# Patient Record
Sex: Male | Born: 1945
Health system: Southern US, Community
[De-identification: ages and names within clinical notes are randomized; demographics above are authoritative.]

## PROBLEM LIST (undated history)

## (undated) DIAGNOSIS — F1910 Other psychoactive substance abuse, uncomplicated: Secondary | ICD-10-CM

## (undated) DIAGNOSIS — F1010 Alcohol abuse, uncomplicated: Secondary | ICD-10-CM

---

## 2017-09-01 ENCOUNTER — Other Ambulatory Visit: Payer: Self-pay

## 2017-09-01 ENCOUNTER — Encounter (HOSPITAL_COMMUNITY): Payer: Self-pay

## 2017-09-01 ENCOUNTER — Emergency Department (HOSPITAL_COMMUNITY): Payer: Medicare Other

## 2017-09-01 ENCOUNTER — Emergency Department (HOSPITAL_COMMUNITY)
Admission: EM | Admit: 2017-09-01 | Discharge: 2017-09-01 | Disposition: A | Discharge status: Home / Self Care | Payer: Medicare Other | Attending: Emergency Medicine | Admitting: Emergency Medicine

## 2017-09-01 DIAGNOSIS — Y939 Activity, unspecified: Secondary | ICD-10-CM | POA: Insufficient documentation

## 2017-09-01 DIAGNOSIS — F17210 Nicotine dependence, cigarettes, uncomplicated: Secondary | ICD-10-CM | POA: Diagnosis not present

## 2017-09-01 DIAGNOSIS — S20219A Contusion of unspecified front wall of thorax, initial encounter: Secondary | ICD-10-CM | POA: Diagnosis not present

## 2017-09-01 DIAGNOSIS — T40601A Poisoning by unspecified narcotics, accidental (unintentional), initial encounter: Secondary | ICD-10-CM | POA: Insufficient documentation

## 2017-09-01 DIAGNOSIS — Y929 Unspecified place or not applicable: Secondary | ICD-10-CM | POA: Insufficient documentation

## 2017-09-01 DIAGNOSIS — Y999 Unspecified external cause status: Secondary | ICD-10-CM | POA: Insufficient documentation

## 2017-09-01 DIAGNOSIS — Y639 Failure in dosage during unspecified surgical and medical care: Secondary | ICD-10-CM | POA: Insufficient documentation

## 2017-09-01 DIAGNOSIS — F1910 Other psychoactive substance abuse, uncomplicated: Secondary | ICD-10-CM

## 2017-09-01 LAB — I-STAT CHEM 8, ED
BUN: 10 mg/dL (ref 8–23)
CHLORIDE: 106 mmol/L (ref 98–111)
Calcium, Ion: 1.11 mmol/L — ABNORMAL LOW (ref 1.15–1.40)
Creatinine, Ser: 1.1 mg/dL (ref 0.61–1.24)
GLUCOSE: 103 mg/dL — AB (ref 70–99)
HCT: 53 % — ABNORMAL HIGH (ref 39.0–52.0)
HEMOGLOBIN: 18 g/dL — AB (ref 13.0–17.0)
Potassium: 3.9 mmol/L (ref 3.5–5.1)
SODIUM: 141 mmol/L (ref 135–145)
TCO2: 22 mmol/L (ref 22–32)

## 2017-09-01 LAB — RAPID URINE DRUG SCREEN, HOSP PERFORMED
Amphetamines: NOT DETECTED
BARBITURATES: NOT DETECTED
Benzodiazepines: NOT DETECTED
Cocaine: POSITIVE — AB
Opiates: POSITIVE — AB
TETRAHYDROCANNABINOL: POSITIVE — AB

## 2017-09-01 MED ORDER — ONDANSETRON HCL 4 MG/2ML IJ SOLN
4.0000 mg | Freq: Once | INTRAMUSCULAR | Status: AC
  Administered 2017-09-01: 4 mg via INTRAVENOUS
  Filled 2017-09-01: qty 2

## 2017-09-01 NOTE — ED Provider Notes (Signed)
[facility] DEPT Provider Note   CSN: [id number] Arrival date & time: 09/01/17  0204    History   Chief Complaint Chief Complaint  Patient presents with  . Drug Overdose    HPI Douglas Spencer is a 72 y.o. male.   71 year old male with a history of alcohol abuse presents to the emergency department secondary to overdose.  EMS was called by the patient's caregiver when he came downstairs to note the patient seated in his recliner, ashy in color, and not breathing.  Patient had previously been drinking with his friend who is a heroin addict.  Caregiver states that he has always been concerned about the patient drinking with his friend due to the possibility of the patient being taken advantage of.  Patient noted to be apneic on scene.  He was given Narcan with improved responsiveness.  Patient states that he can only remember drinking with his friend while seated in his recliner.  He denies ever using illicit drugs, stating that he has "never liked it"; patient stating, "I'm just a drunk".     History reviewed. No pertinent past medical history.  There are no active problems to display for this patient.   History reviewed. No pertinent surgical history.      Home Medications    Prior to Admission medications   Not on File    Family History No family history on file.  Social History Social History   Tobacco Use  . Smoking status: Current Every Day Smoker    Packs/day: 0.50    Types: Cigarettes  Substance Use Topics  . Alcohol use: Yes    Comment: pt states he drinks multiple beers every day.  . Drug use: Yes    Types: Marijuana     Allergies   Patient has no allergy information on record.   Review of Systems Review of Systems Ten systems reviewed and are negative for acute change, except as noted in the HPI.    Physical Exam Updated Vital Signs BP 138/90   Pulse 67   Temp 97.8 F (36.6 C) (Oral)   Resp 13   Ht 5\' 8"   (1.727 m)   Wt 54.4 kg (120 lb)   SpO2 96%   BMI 18.25 kg/m   Physical Exam  Constitutional: He is oriented to person, place, and time. He appears well-developed and well-nourished. No distress.  Nontoxic appearing. Emesis on pants.  HENT:  Head: Normocephalic and atraumatic.  Eyes: Conjunctivae and EOM are normal. No scleral icterus.  Pupils 51mm bilaterally, reactive.  Neck: Normal range of motion.  Cardiovascular: Normal rate, regular rhythm and intact distal pulses.  Pulmonary/Chest: Effort normal. No stridor. No respiratory distress. He has no wheezes.  Bruising to central chest without crepitus. Lungs CTAB.  Abdominal: Soft. He exhibits no distension. There is no tenderness. There is no guarding.  Soft, nontender abdomen.  Musculoskeletal: Normal range of motion.  Neurological: He is alert and oriented to person, place, and time. He exhibits normal muscle tone. Coordination normal.  GCS 15. Speech is goal oriented. Patient moving all extremities spontaneously.  Skin: Skin is warm and dry. No rash noted. He is not diaphoretic. No erythema. No pallor.  Psychiatric: He has a normal mood and affect. His behavior is normal.  Nursing note and vitals reviewed.    ED Treatments / Results  Labs (all labs ordered are listed, but only abnormal results are displayed) Labs Reviewed  RAPID URINE DRUG SCREEN, HOSP PERFORMED - Abnormal;  Notable for the following components:      Result Value   Opiates POSITIVE (*)    Cocaine POSITIVE (*)    Tetrahydrocannabinol POSITIVE (*)    All other components within normal limits  I-STAT CHEM 8, ED    EKG None  Radiology Dg Chest 2 View  Result Date: 09/01/2017 CLINICAL DATA:  Chest wall contusion EXAM: CHEST - 2 VIEW COMPARISON:  None. FINDINGS: Hyperinflation. No acute airspace disease or effusion. Normal heart size. No pneumothorax. Degenerative changes of the spine. IMPRESSION: No active cardiopulmonary disease.  Hyperinflation.  Electronically Signed   By: Donavan Foil M.D.   On: 09/01/2017 04:06    Procedures Procedures (including critical care time)  Medications Ordered in ED Medications  ondansetron (ZOFRAN) injection 4 mg (has no administration in time range)     Initial Impression / Assessment and Plan / ED Course  I have reviewed the triage vital signs and the nursing notes.  Pertinent labs & imaging results that were available during my care of the patient were reviewed by me and considered in my medical decision making (see chart for details).     72 year old male presents to the emergency department following opiate overdose.  Given Narcan by EMS prior to arrival which improved mentation.  EMS was called by caregiver who found the patient "ashy" and not breathing.  Patient denies any known ingestion of illicit substances.  He last recalls drinking beer with his friend who is a known heroin abuser.  The patient has been hemodynamically stable without clinical decompensation over 2.5-hour observation in the ED.  He has no complaints of pain.  Only signs of external injury is bruising to the chest.  There is no evidence of acute cardiopulmonary abnormality on chest x-ray.  Plan for discharge and outpatient primary care follow-up as needed.  Patient expresses comfort with discharge.  Return precautions discussed and provided. Patient discharged in stable condition with no unaddressed concerns.   Vitals:   09/01/17 0205 09/01/17 0208 09/01/17 0214 09/01/17 0430  BP: 117/79   138/90  Pulse: 64   67  Resp: 16   13  Temp: 97.8 F (36.6 C)     TempSrc: Oral     SpO2: 99% 95%  96%  Weight:   54.4 kg (120 lb)   Height:   5\' 8"  (1.727 m)     Final Clinical Impressions(s) / ED Diagnoses   Final diagnoses:  Opiate overdose, accidental or unintentional, initial encounter [facility])  Polysubstance abuse [facility])    ED Discharge Orders    None       Antonietta Breach, PA-C 09/01/17 0511    Shanon Rosser,  MD 09/01/17 [phone]

## 2017-09-01 NOTE — ED Notes (Signed)
Bed: JG85 Expected date:  Expected time:  Means of arrival:  Comments: 72 yo M/Heroine Overdose

## 2017-09-01 NOTE — ED Triage Notes (Signed)
Pt arrives by [facility] Per EMS, pt was found by EMS on bedroom floor unconscious. Pt was given 1 mg of Narcan and took 3 minutes to regain consciousness. Pt denies drug use. 12 lead was performed and was unremarkable. Pt does not have any signs of injury. Pt's caregiver called ED and stated that the pt "shot him up with heroin."

## 2017-09-01 NOTE — Discharge Instructions (Signed)
You were found to be positive for opiates as well as cocaine and marijuana.  We recommend that you discontinue use of all illicit substances.  Monitor your alcohol intake and try to drink in moderation.  Follow-up with your primary care doctor for recheck as needed.

## 2019-03-05 ENCOUNTER — Ambulatory Visit: Payer: Medicare Other | Attending: Internal Medicine

## 2019-03-26 ENCOUNTER — Ambulatory Visit: Payer: Medicare Other | Attending: Internal Medicine

## 2019-03-26 DIAGNOSIS — Z23 Encounter for immunization: Secondary | ICD-10-CM

## 2019-03-26 NOTE — Progress Notes (Signed)
   Covid-19 Vaccination Clinic  Name:  Douglas Spencer    MRN: [id number] DOB: 1945/05/15  03/26/2019  [name] was observed post Covid-19 immunization for 15 minutes without incidence. He was provided with Vaccine Information Sheet and instruction to access the V-Safe system.   [name] was instructed to call 911 with any severe reactions post vaccine: Marland Kitchen Difficulty breathing  . Swelling of your face and throat  . A fast heartbeat  . A bad rash all over your body  . Dizziness and weakness    Immunizations Administered    Name Date Dose VIS Date Route   Pfizer COVID-19 Vaccine 03/26/2019  2:02 PM 0.3 mL 01/22/2019 Intramuscular   Manufacturer: [place]   Lot: [id number]   [place]: [id number]

## 2020-02-27 ENCOUNTER — Emergency Department (HOSPITAL_COMMUNITY): Payer: Medicare HMO

## 2020-02-27 ENCOUNTER — Emergency Department (HOSPITAL_COMMUNITY)
Admission: EM | Admit: 2020-02-27 | Discharge: 2020-02-28 | Disposition: A | Discharge status: Home / Self Care | Payer: Medicare HMO | Attending: Emergency Medicine | Admitting: Emergency Medicine

## 2020-02-27 DIAGNOSIS — R29810 Facial weakness: Secondary | ICD-10-CM | POA: Diagnosis not present

## 2020-02-27 DIAGNOSIS — G9340 Encephalopathy, unspecified: Secondary | ICD-10-CM | POA: Diagnosis not present

## 2020-02-27 DIAGNOSIS — R251 Tremor, unspecified: Secondary | ICD-10-CM | POA: Diagnosis present

## 2020-02-27 DIAGNOSIS — R569 Unspecified convulsions: Secondary | ICD-10-CM | POA: Diagnosis not present

## 2020-02-27 DIAGNOSIS — I499 Cardiac arrhythmia, unspecified: Secondary | ICD-10-CM | POA: Diagnosis not present

## 2020-02-27 DIAGNOSIS — R55 Syncope and collapse: Secondary | ICD-10-CM | POA: Insufficient documentation

## 2020-02-27 DIAGNOSIS — H5589 Other irregular eye movements: Secondary | ICD-10-CM | POA: Insufficient documentation

## 2020-02-27 DIAGNOSIS — R4701 Aphasia: Secondary | ICD-10-CM | POA: Diagnosis not present

## 2020-02-27 DIAGNOSIS — R4020 Unspecified coma: Secondary | ICD-10-CM | POA: Diagnosis not present

## 2020-02-27 DIAGNOSIS — R4182 Altered mental status, unspecified: Secondary | ICD-10-CM | POA: Insufficient documentation

## 2020-02-27 DIAGNOSIS — R9431 Abnormal electrocardiogram [ECG] [EKG]: Secondary | ICD-10-CM | POA: Diagnosis not present

## 2020-02-27 DIAGNOSIS — F17210 Nicotine dependence, cigarettes, uncomplicated: Secondary | ICD-10-CM | POA: Insufficient documentation

## 2020-02-27 DIAGNOSIS — R404 Transient alteration of awareness: Secondary | ICD-10-CM | POA: Diagnosis not present

## 2020-02-27 DIAGNOSIS — R402431 Glasgow coma scale score 3-8, in the field [EMT or ambulance]: Secondary | ICD-10-CM | POA: Diagnosis not present

## 2020-02-27 DIAGNOSIS — R69 Illness, unspecified: Secondary | ICD-10-CM | POA: Diagnosis not present

## 2020-02-27 DIAGNOSIS — R4781 Slurred speech: Secondary | ICD-10-CM | POA: Diagnosis not present

## 2020-02-27 LAB — CBG MONITORING, ED: Glucose-Capillary: 95 mg/dL (ref 70–99)

## 2020-02-27 LAB — I-STAT CHEM 8, ED
BUN: 16 mg/dL (ref 8–23)
Calcium, Ion: 1.1 mmol/L — ABNORMAL LOW (ref 1.15–1.40)
Chloride: 105 mmol/L (ref 98–111)
Creatinine, Ser: 0.9 mg/dL (ref 0.61–1.24)
Glucose, Bld: 87 mg/dL (ref 70–99)
HCT: 50 % (ref 39.0–52.0)
Hemoglobin: 17 g/dL (ref 13.0–17.0)
Potassium: 4.1 mmol/L (ref 3.5–5.1)
Sodium: 141 mmol/L (ref 135–145)
TCO2: 23 mmol/L (ref 22–32)

## 2020-02-27 LAB — DIFFERENTIAL
Abs Immature Granulocytes: 0.01 10*3/uL (ref 0.00–0.07)
Basophils Absolute: 0.1 10*3/uL (ref 0.0–0.1)
Basophils Relative: 1 %
Eosinophils Absolute: 0.4 10*3/uL (ref 0.0–0.5)
Eosinophils Relative: 5 %
Immature Granulocytes: 0 %
Lymphocytes Relative: 43 %
Lymphs Abs: 3.2 10*3/uL (ref 0.7–4.0)
Monocytes Absolute: 0.7 10*3/uL (ref 0.1–1.0)
Monocytes Relative: 9 %
Neutro Abs: 3.1 10*3/uL (ref 1.7–7.7)
Neutrophils Relative %: 42 %

## 2020-02-27 LAB — CBC
HCT: 51.5 % (ref 39.0–52.0)
Hemoglobin: 17.1 g/dL — ABNORMAL HIGH (ref 13.0–17.0)
MCH: 29.5 pg (ref 26.0–34.0)
MCHC: 33.2 g/dL (ref 30.0–36.0)
MCV: 88.9 fL (ref 80.0–100.0)
Platelets: 246 10*3/uL (ref 150–400)
RBC: 5.79 MIL/uL (ref 4.22–5.81)
RDW: 13.6 % (ref 11.5–15.5)
WBC: 7.5 10*3/uL (ref 4.0–10.5)
nRBC: 0 % (ref 0.0–0.2)

## 2020-02-27 LAB — COMPREHENSIVE METABOLIC PANEL
ALT: 45 U/L — ABNORMAL HIGH (ref 0–44)
AST: 37 U/L (ref 15–41)
Albumin: 3.7 g/dL (ref 3.5–5.0)
Alkaline Phosphatase: 58 U/L (ref 38–126)
Anion gap: 14 (ref 5–15)
BUN: 14 mg/dL (ref 8–23)
CO2: 20 mmol/L — ABNORMAL LOW (ref 22–32)
Calcium: 9.2 mg/dL (ref 8.9–10.3)
Chloride: 105 mmol/L (ref 98–111)
Creatinine, Ser: 1.03 mg/dL (ref 0.61–1.24)
GFR, Estimated: >60 mL/min (ref >60)
Glucose, Bld: 93 mg/dL (ref 70–99)
Potassium: 4.1 mmol/L (ref 3.5–5.1)
Sodium: 139 mmol/L (ref 135–145)
Total Bilirubin: 0.6 mg/dL (ref 0.3–1.2)
Total Protein: 6.7 g/dL (ref 6.5–8.1)

## 2020-02-27 LAB — PROTIME-INR
INR: 1 (ref 0.8–1.2)
Prothrombin Time: 12.4 seconds (ref 11.4–15.2)

## 2020-02-27 LAB — ETHANOL: Alcohol, Ethyl (B): <10 mg/dL (ref <10)

## 2020-02-27 LAB — APTT: aPTT: 25 seconds (ref 24–36)

## 2020-02-27 MED ORDER — LORAZEPAM 2 MG/ML IJ SOLN: 2.0000 mg | Freq: Once | INTRAMUSCULAR | Status: DC

## 2020-02-27 MED ORDER — LORAZEPAM 2 MG/ML IJ SOLN
2.0000 mg | Freq: Once | INTRAMUSCULAR | Status: AC | PRN
  Administered 2020-02-27: 2 mg via INTRAVENOUS
  Filled 2020-02-27: qty 1

## 2020-02-27 MED ORDER — LEVETIRACETAM 500 MG PO TABS
500.0000 mg | ORAL_TABLET | Freq: Two times a day (BID) | ORAL | Status: DC
  Administered 2020-02-27 – 2020-02-28 (×2): 500 mg via ORAL
  Filled 2020-02-27 (×2): qty 1

## 2020-02-27 NOTE — ED Notes (Signed)
Pt to MRI

## 2020-02-27 NOTE — ED Notes (Signed)
Pt deciding to leave AMA. Pt told that he was leaving at his own risk. This RN attempted to contact ED provider. Could not get a hold of the provider. Pt signed AMA form. Ambulatory and stable out of the department

## 2020-02-27 NOTE — ED Notes (Signed)
Neurologist stopped pt in hallway as pt was leaving and pt decided to stay.

## 2020-02-27 NOTE — ED Triage Notes (Signed)
Pt bib gems after family reports that pt "shaking" and LOC. LKW 1/16 2020. EMS reports left gaze preference, AMS, aphasia, and right facial droop. Denies blood thinners.   158/104  HR-98  CBG:98 98% RA

## 2020-02-27 NOTE — Consult Note (Addendum)
Neurology Consultation Reason for Consult: Code stroke Requesting provider: Domenic Moras, PA-C  CC: Confusion  History is obtained from: Patient's emergency contact Douglas Spencer  HPI: Douglas Spencer is a 75 y.o. male with a past medical history significant for daily alcohol use, tobacco abuse, and prior polysubstance abuse (2019 U tox positive for cocaine, THC and opiates).  Per Mr. Douglas Spencer, the patient drinks 1 beer daily (42 ounces over 6 hours).  Today he had not had his usual beer, but no clear reason for this change in his pattern.  He has never had any history of seizures.  At around 8:10 PM he had been working on a crossword puzzle but stopped responding.  He walked down the hall but was looking at the floor and behaving somewhat strangely.  He then sat back down on the couch but had head version to the right with his body locking up and just generalized jerking activity lasting 3 to 5 minutes.  By the time of EMS arrival, he had right-sided weakness, left-sided gaze and was nonverbal, for which code stroke was activated (seizure activity was not reported to EMS).  His blood pressures were in the 150s over 100s, blood glucose for EMS was 98.  His last admission here was in 2019 at which time he presented with overdose (cocaine, marijuana, opiates) reportedly slipped to him by a visitor to his home.  He does not take any medications and has no known drug allergies.  LKW: 8:10 PM tPA given?: No, symptoms minor and rapidly improving  ROS: Unable to assess secondary to patient's mental status.  However caregiver reports no recent concerns.  No past medical history on file. Please see past medical history as noted above  No family history on file. Mr. Douglas Spencer reports that the patient's mother had COPD and multiple strokes, passing away in her 58s.  The patient also has several maternal aunts who have had strokes as well.   Social History:  reports that he has been smoking cigarettes. He has been  smoking about 0.50 packs per day. He does not have any smokeless tobacco history on file. He reports current alcohol use. He reports current drug use. Drug: Marijuana.   Exam: Current vital signs: There were no vitals taken for this visit. Vital signs in last 24 hours: BP: ()/()  Arterial Line BP: ()/()    Physical Exam  Constitutional: Appears well-developed and well-nourished.  Appears younger than stated age Psych: Affect is confused Eyes: No scleral injection HENT: No OP obstruction, no evidence of tongue bite, MSK: no joint deformities, no spinal tenderness to palpation.  Cardiovascular: Normal rate and regular rhythm.  Respiratory: Effort normal, non-labored breathing GI: Soft.  No distension. There is no tenderness.  GU: No evidence of incontinence Skin: WDI  Neuro: Mental Status: Patient is awake, alert, but very confused.  He reports he cannot remember what month it is, but correctly states that he is 75 years old. Patient is unable to give any significant history as he does not remember. Patient has significant perseveration, some difficulty with naming, cannot accurately repeat although this is also related to poor attention Cranial Nerves: II: Visual Fields are full. Pupils are equal, round, and reactive to light.  4 to 2 mm III,IV, VI: EOMI without ptosis or diploplia.  V: Facial sensation is symmetric to temperature VII: Facial movement is symmetric.  VIII: hearing is intact to voice X: Uvula elevates symmetrically XI: Shoulder shrug is symmetric, 5/5 bilaterally. XII: tongue is midline  without atrophy or fasciculations.  Motor: Tone is normal. Bulk is normal.  There is no pronator drift in both of his lower extremities can maintain antigravity for at least 5 seconds.  His confusion makes confrontational strength testing a bit challenging Sensory: Sensation is symmetric to light touch and temperature in the arms and legs, perhaps slightly reduced to the right  foot to temperature Deep Tendon Reflexes: 2+ and symmetric in the biceps and patellae.  Plantars: Toes are downgoing bilaterally.  Cerebellar: FNF and HKS are intact bilaterally  NIHSS total 3 Score breakdown: 1-unable to name the month, 1-mild right facial droop, 1-mild aphasia  I have reviewed labs in epic and the results pertinent to this consultation are: CBC with mildly elevated hemoglobin at 17.1, otherwise normal, glucose normal at 87, CMP notable for mildly elevated ALT at 45, mildly low CO2 at 20.  I have reviewed the images obtained: Head CT with no acute intracranial process though there is some chronic microvascular change  Impression: This is a 75 year old male presenting with first-time seizure.  Although this could be in the setting of alcohol withdrawal (daily drinking and not having alcohol today), I am concerned about the focal onset of the seizure (initial confusion, followed by head version prior to generalized tonic-clonic activity).  Given that focality of the first-time seizure is associated with 80% risk for recurrent seizures, will additionally start Keppra at this time.   Recommendations: -MRI brain with and without contrast -Routine EEG -Keppra 500 mg twice daily -Outpatient follow-up with neurology, referral to [place] has been placed  Standard seizure precautions: Per [facility] statutes, patients with seizures are not allowed to drive until  they have been seizure-free for six months. Use caution when using heavy equipment or power tools. Avoid working on ladders or at heights. Take showers instead of baths. Ensure the water temperature is not too high on the home water heater. Do not go swimming alone. When caring for infants or small children, sit down when holding, feeding, or changing them to minimize risk of injury to the child in the event you have a seizure.  To reduce risk of seizures, maintain good sleep hygiene avoid alcohol and illicit drug use,  take all anti-seizure medications as prescribed.  These precautions were discussed with patient's caregiver, but will need to be rediscussed with the patient as his mental status returns to [place] MD-PhD [facility] [phone]  MRI brain reviewed, normal. EEG pending; may be completed on an outpatient basis if patient remains clinically stable and eager to go home. Utox resulted positive for cocaine, THC, amphetamines (neg for benzos despite ativan given here). Patient will need counseling on substance use cessation. I remain concerned for underlying seizure disorder given focality of symptoms and still recommend continuing Keppra at this time. Neurology will be available on an as-needed basis, please page with questions/concerns.

## 2020-02-27 NOTE — ED Provider Notes (Addendum)
[facility] EMERGENCY DEPARTMENT Provider Note   CSN: [id number] Arrival date & time: 02/27/20  2120  An emergency department physician performed an initial assessment on this suspected stroke patient at 2115.  History No chief complaint on file.   Douglas Spencer is a 75 y.o. male.  The history is provided by the patient and the EMS personnel. No language interpreter was used.      This is a 75 year old male brought here via EMS with initial code stroke status.  Per family, patient was found "shaking" and loss of consciousness was last known normal at approximately 8:20PM.  EMS report patient has a left gaze preference, was altered, aphasic with right facial droop.  A code stroke was activated, patient was evaluated by our neurologist and code stroke subsequently was canceled prior to my evaluation.  At this time I was able to obtain history from patient.  Patient states he does not know what happened.  He last recalls working on a crossword puzzle at home and now finding himself in the ED.  He denies having any active pain.  No headache, no facial numbness no chest pain shortness of breath abdominal pain back pain nausea vomiting diarrhea runny nose sneezing coughing having focal numbness or focal weakness.  He denies tongue biting or urinating himself.  Denies any history of prior stroke or seizures.  He does admits to drinking alcohol on a regular basis and last use was yesterday.  He reports occasional use of marijuana and remote history of heroin use but none recently.  He is not on any blood thinner medication.  He denies any recent medication change or any environmental changes.  No past medical history on file.  There are no problems to display for this patient.   No past surgical history on file.     No family history on file.  Social History   Tobacco Use  . Smoking status: Current Every Day Smoker    Packs/day: 0.50    Types: Cigarettes  Vaping Use  .  Vaping Use: Never used  Substance Use Topics  . Alcohol use: Yes    Comment: pt states he drinks multiple beers every day.  . Drug use: Yes    Types: Marijuana    Home Medications Prior to Admission medications   Not on File    Allergies    Patient has no allergy information on record.  Review of Systems   Review of Systems  All other systems reviewed and are negative.   Physical Exam Updated Vital Signs BP (!) 148/96   Pulse (!) 101   Temp (!) 97.3 F (36.3 C) (Oral)   Resp 20   Wt 61.2 kg   SpO2 96%   BMI 20.51 kg/m   Physical Exam Vitals and nursing note reviewed.  Constitutional:      General: He is not in acute distress.    Appearance: He is well-developed and well-nourished.  HENT:     Head: Normocephalic and atraumatic.     Mouth/Throat:     Comments: No tongue injury Eyes:     Extraocular Movements: Extraocular movements intact.     Conjunctiva/sclera: Conjunctivae normal.     Pupils: Pupils are equal, round, and reactive to light.  Cardiovascular:     Rate and Rhythm: Normal rate and regular rhythm.     Pulses: Normal pulses.     Heart sounds: Normal heart sounds.  Pulmonary:     Breath sounds: Normal breath sounds.  Abdominal:     General: Abdomen is flat.     Palpations: Abdomen is soft.     Tenderness: There is no abdominal tenderness.  Musculoskeletal:        General: Normal range of motion.     Cervical back: Neck supple.  Skin:    Findings: No rash.  Neurological:     Mental Status: He is alert and oriented to person, place, and time.     Comments: Neurologic exam:  Speech clear, pupils equal round reactive to light, extraocular movements intact  Normal peripheral visual fields Cranial nerves III through XII normal including no facial droop Follows commands, moves all extremities x4, normal strength to bilateral upper and lower extremities at all major muscle groups including grip Sensation normal to light touch  Coordination  intact, no limb ataxia, finger-nose-finger normal Rapid alternating movements normal No pronator drift Gait not tested   Psychiatric:        Mood and Affect: Mood and affect and mood normal.     ED Results / Procedures / Treatments   Labs (all labs ordered are listed, but only abnormal results are displayed) Labs Reviewed  CBC - Abnormal; Notable for the following components:      Result Value   Hemoglobin 17.1 (*)    All other components within normal limits  COMPREHENSIVE METABOLIC PANEL - Abnormal; Notable for the following components:   CO2 20 (*)    ALT 45 (*)    All other components within normal limits  RAPID URINE DRUG SCREEN, HOSP PERFORMED - Abnormal; Notable for the following components:   Cocaine POSITIVE (*)    Amphetamines POSITIVE (*)    Tetrahydrocannabinol POSITIVE (*)    All other components within normal limits  URINALYSIS, ROUTINE W REFLEX MICROSCOPIC - Abnormal; Notable for the following components:   APPearance HAZY (*)    Protein, ur 30 (*)    All other components within normal limits  I-STAT CHEM 8, ED - Abnormal; Notable for the following components:   Calcium, Ion 1.10 (*)    All other components within normal limits  ETHANOL  PROTIME-INR  APTT  DIFFERENTIAL  CBG MONITORING, ED    EKG None  ED ECG REPORT   Date: 02/27/2020  Rate: 98  Rhythm: normal sinus rhythm  QRS Axis: normal  Intervals: normal  ST/T Wave abnormalities: normal  Conduction Disutrbances:none  Narrative Interpretation: LVH  Old EKG Reviewed: unchanged  I have personally reviewed the EKG tracing and agree with the computerized printout as noted.   Radiology MR BRAIN W WO CONTRAST  Result Date: 02/28/2020 CLINICAL DATA:  Encephalopathy with aphasia and right facial droop EXAM: MRI HEAD WITHOUT AND WITH CONTRAST TECHNIQUE: Multiplanar, multiecho pulse sequences of the brain and surrounding structures were obtained without and with intravenous contrast. CONTRAST:  52mL  GADAVIST GADOBUTROL 1 MMOL/ML IV SOLN COMPARISON:  Head CT 12/27/2020 FINDINGS: Brain: No acute infarct, mass effect or extra-axial collection. No acute or chronic hemorrhage. There is multifocal hyperintense T2-weighted signal within the white matter. Generalized volume loss without a clear lobar predilection. The midline structures are normal. There is no abnormal contrast enhancement. Vascular: Major flow voids are preserved. Skull and upper cervical spine: Normal calvarium and skull base. Visualized upper cervical spine and soft tissues are normal. Sinuses/Orbits:No paranasal sinus fluid levels or advanced mucosal thickening. No mastoid or middle ear effusion. Normal orbits. IMPRESSION: 1. No acute intracranial abnormality. 2. Findings of chronic microvascular ischemia and generalized volume loss. Electronically Signed  By: Ulyses Jarred M.D.   On: 02/28/2020 00:57   CT HEAD CODE STROKE WO CONTRAST  Result Date: 02/27/2020 CLINICAL DATA:  Code stroke. Right-sided facial droop with leftward gaze and slurred speech EXAM: CT HEAD WITHOUT CONTRAST TECHNIQUE: Contiguous axial images were obtained from the base of the skull through the vertex without intravenous contrast. COMPARISON:  None. FINDINGS: Brain: There is no intracranial hemorrhage. No other extra-axial collection. No midline shift or other mass effect. Ventricles and other CSF spaces are normal for age. There is periventricular hypoattenuation compatible with chronic microvascular disease. There is no acute cortical infarction. Vascular: No hyperdense vessel or unexpected calcification. Skull: Normal. Negative for fracture or focal lesion. Sinuses/Orbits: No acute finding. Other: None ASPECTS ([place] Stroke Program Early CT Score) - Ganglionic level infarction (caudate, lentiform nuclei, internal capsule, insula, M1-M3 cortex): 7 - Supraganglionic infarction (M4-M6 cortex): 3 Total score (0-10 with 10 being normal): 10 IMPRESSION: 1. No acute  intracranial abnormality. 2. ASPECTS is 10. These results were communicated to Dr. Lesleigh Noe at 9:39 pm on 02/27/2020 by text page via the [facility] messaging system. Electronically Signed   By: Ulyses Jarred M.D.   On: 02/27/2020 21:39    Procedures Procedures (including critical care time)  Medications Ordered in ED Medications  levETIRAcetam (KEPPRA) tablet 500 mg (500 mg Oral Given 02/27/20 2331)  LORazepam (ATIVAN) injection 2 mg (2 mg Intravenous Given 02/27/20 2351)  gadobutrol (GADAVIST) 1 MMOL/ML injection 6 mL (6 mLs Intravenous Contrast Given 02/28/20 0030)    ED Course  I have reviewed the triage vital signs and the nursing notes.  Pertinent labs & imaging results that were available during my care of the patient were reviewed by me and considered in my medical decision making (see chart for details).    MDM Rules/Calculators/A&P                          BP 126/78 (BP Location: Right Arm)   Pulse 80   Temp (!) 97.3 F (36.3 C) (Oral)   Resp 16   Wt 61.2 kg   SpO2 99%   BMI 20.51 kg/m   Final Clinical Impression(s) / ED Diagnoses Final diagnoses:  First time seizure ([place])    Rx / DC Orders ED Discharge Orders         Ordered    levETIRAcetam (KEPPRA) 500 MG tablet  2 times daily        02/28/20 0108         71:91 PM 75 year old male brought here via EMS with initial code stroke status.  Per family, patient was found "shaking" and loss of consciousness was last known normal at approximately 8:20PM.  EMS report patient has a left gaze preference, was altered, aphasic with right facial droop.  A code stroke was activated, patient was evaluated by our neurologist and code stroke subsequently was canceled prior to my evaluation.  Patient symptoms more indicative of seizure.  He denies any prior history of seizure but does admits to heavy alcohol use.  He is at this time at his baseline and answering question appropriately.  No signs of trauma noted.  11:22  PM Patient voiced desire to leave [place].  Stating that he felt too hot staying in the current room.  Also report he is bored and he does not have his reading glasses.  Moderate amount of time was spent between myself as well as neurologist Dr. Curly Shores to  convince patient to stay.  At this time patient is willing to stay for further work-up.  Will monitor closely.  1:00 AM Brain MRI obtained showed no acute finding.  Labs are reassuring.  Alcohol level undetectable.  Since patient drinks alcohol on regular basis, his seizure may be due to alcohol withdrawal. Standard seizure precaution discussed.  Doubt infectious etiology causing his symptoms.  Doubt stroke.  2:21 AM UA resulted no evidence of UTI.  UDS is currently pending.  patient is sleeping but arousable. At this time he is stable to be discharged home with Keppra 500 mg twice daily and outpatient follow-up with neurology for further work-up which will likely include EEG.    2:44 AM UDS resulted showing positive Cocaine, amphetamines and tetrahydrocannabinol.  Suspect seizure-like activity 2/2 polysubstance use    Domenic Moras, PA-C 02/28/20 0245    Charlesetta Shanks, MD 03/01/20 [phone]

## 2020-02-28 ENCOUNTER — Other Ambulatory Visit (HOSPITAL_COMMUNITY): Payer: Self-pay | Admitting: Emergency Medicine

## 2020-02-28 DIAGNOSIS — R569 Unspecified convulsions: Secondary | ICD-10-CM | POA: Diagnosis not present

## 2020-02-28 DIAGNOSIS — R4701 Aphasia: Secondary | ICD-10-CM | POA: Diagnosis not present

## 2020-02-28 DIAGNOSIS — G9340 Encephalopathy, unspecified: Secondary | ICD-10-CM | POA: Diagnosis not present

## 2020-02-28 LAB — URINALYSIS, ROUTINE W REFLEX MICROSCOPIC
Bacteria, UA: NONE SEEN
Bilirubin Urine: NEGATIVE
Glucose, UA: NEGATIVE mg/dL
Hgb urine dipstick: NEGATIVE
Ketones, ur: NEGATIVE mg/dL
Leukocytes,Ua: NEGATIVE
Nitrite: NEGATIVE
Protein, ur: 30 mg/dL — AB
Specific Gravity, Urine: 1.027 (ref 1.005–1.030)
pH: 5 (ref 5.0–8.0)

## 2020-02-28 LAB — RAPID URINE DRUG SCREEN, HOSP PERFORMED
Amphetamines: POSITIVE — AB
Barbiturates: NOT DETECTED
Benzodiazepines: NOT DETECTED
Cocaine: POSITIVE — AB
Opiates: NOT DETECTED
Tetrahydrocannabinol: POSITIVE — AB

## 2020-02-28 MED ORDER — GADOBUTROL 1 MMOL/ML IV SOLN
6.0000 mL | Freq: Once | INTRAVENOUS | Status: AC | PRN
  Administered 2020-02-28: 6 mL via INTRAVENOUS

## 2020-02-28 MED ORDER — LEVETIRACETAM 500 MG PO TABS: 500.0000 mg | ORAL_TABLET | Freq: Two times a day (BID) | ORAL | 0 refills | Status: DC

## 2020-02-28 MED FILL — levETIRAcetam 500 MG TABS: 500 | 30 days supply | Qty: 60 | Fill #0

## 2020-02-28 NOTE — ED Notes (Signed)
Patient sleeping on stretcher during initial shift assessment. Normal respiratory effort, no distress noted.

## 2020-02-28 NOTE — Discharge Planning (Addendum)
RNCM consulted regarding uninsured pt requiring kepra Rx.  Transitions of Care Department will assist with co-pay via petty cash. [facility] will deliver Rx to patient at bedside prior to discharge from hospital.

## 2020-02-28 NOTE — Social Work (Signed)
CSW collected meds from [facility] and delivered to Pt. CSW called [employer] for transport. CSW called caregiver Maeola Harman to inform him that Pt would be arriving home via cab.

## 2020-02-28 NOTE — Discharge Instructions (Addendum)
You have been evaluated for your altered mental status.  This is likely due to a seizure.  Please start taking Keppra as prescribed, call and follow up closely with neurology for further work up.    Standard seizure precautions: Per [facility] statutes, patients with seizures are not allowed to drive until  they have been seizure-free for six months. Use caution when using heavy equipment or power tools. Avoid working on ladders or at heights. Take showers instead of baths. Ensure the water temperature is not too high on the home water heater. Do not go swimming alone. When caring for infants or small children, sit down when holding, feeding, or changing them to minimize risk of injury to the child in the event you have a seizure.  To reduce risk of seizures, maintain good sleep hygiene avoid alcohol and illicit drug use, take all anti-seizure medications as prescribed.

## 2020-02-28 NOTE — ED Notes (Signed)
Patient sleeping on stretcher awaiting family to come pick him up. Patient with no distress noted. Respirations even and unlabored.

## 2020-04-20 ENCOUNTER — Encounter: Payer: Self-pay | Admitting: Neurology

## 2020-04-20 ENCOUNTER — Ambulatory Visit: Payer: Medicare HMO | Admitting: Neurology

## 2021-01-23 DIAGNOSIS — H524 Presbyopia: Secondary | ICD-10-CM | POA: Diagnosis not present

## 2021-01-30 ENCOUNTER — Emergency Department (HOSPITAL_COMMUNITY): Payer: Medicare HMO

## 2021-01-30 ENCOUNTER — Inpatient Hospital Stay (HOSPITAL_COMMUNITY)
Admission: EM | Admit: 2021-01-30 | Discharge: 2021-02-06 | DRG: 061 | Disposition: A | Discharge status: Rehabilitation Facility | Payer: Medicare HMO | Attending: Neurology | Admitting: Neurology

## 2021-01-30 ENCOUNTER — Inpatient Hospital Stay (HOSPITAL_COMMUNITY): Payer: Medicare HMO

## 2021-01-30 DIAGNOSIS — I639 Cerebral infarction, unspecified: Secondary | ICD-10-CM

## 2021-01-30 DIAGNOSIS — G43909 Migraine, unspecified, not intractable, without status migrainosus: Secondary | ICD-10-CM | POA: Diagnosis present

## 2021-01-30 DIAGNOSIS — Z8673 Personal history of transient ischemic attack (TIA), and cerebral infarction without residual deficits: Secondary | ICD-10-CM | POA: Diagnosis not present

## 2021-01-30 DIAGNOSIS — Z681 Body mass index (BMI) 19 or less, adult: Secondary | ICD-10-CM | POA: Diagnosis not present

## 2021-01-30 DIAGNOSIS — F419 Anxiety disorder, unspecified: Secondary | ICD-10-CM | POA: Diagnosis present

## 2021-01-30 DIAGNOSIS — F1410 Cocaine abuse, uncomplicated: Secondary | ICD-10-CM | POA: Diagnosis present

## 2021-01-30 DIAGNOSIS — Z4659 Encounter for fitting and adjustment of other gastrointestinal appliance and device: Secondary | ICD-10-CM | POA: Diagnosis not present

## 2021-01-30 DIAGNOSIS — I5032 Chronic diastolic (congestive) heart failure: Secondary | ICD-10-CM | POA: Diagnosis present

## 2021-01-30 DIAGNOSIS — K5900 Constipation, unspecified: Secondary | ICD-10-CM | POA: Diagnosis present

## 2021-01-30 DIAGNOSIS — I6782 Cerebral ischemia: Secondary | ICD-10-CM | POA: Diagnosis not present

## 2021-01-30 DIAGNOSIS — Z9114 Patient's other noncompliance with medication regimen: Secondary | ICD-10-CM | POA: Diagnosis not present

## 2021-01-30 DIAGNOSIS — R29708 NIHSS score 8: Secondary | ICD-10-CM | POA: Diagnosis present

## 2021-01-30 DIAGNOSIS — I69354 Hemiplegia and hemiparesis following cerebral infarction affecting left non-dominant side: Secondary | ICD-10-CM | POA: Diagnosis present

## 2021-01-30 DIAGNOSIS — F1210 Cannabis abuse, uncomplicated: Secondary | ICD-10-CM | POA: Diagnosis present

## 2021-01-30 DIAGNOSIS — E7800 Pure hypercholesterolemia, unspecified: Secondary | ICD-10-CM | POA: Diagnosis not present

## 2021-01-30 DIAGNOSIS — R1312 Dysphagia, oropharyngeal phase: Secondary | ICD-10-CM | POA: Diagnosis not present

## 2021-01-30 DIAGNOSIS — R270 Ataxia, unspecified: Secondary | ICD-10-CM | POA: Diagnosis present

## 2021-01-30 DIAGNOSIS — R29810 Facial weakness: Secondary | ICD-10-CM | POA: Diagnosis not present

## 2021-01-30 DIAGNOSIS — L988 Other specified disorders of the skin and subcutaneous tissue: Secondary | ICD-10-CM | POA: Diagnosis present

## 2021-01-30 DIAGNOSIS — I63311 Cerebral infarction due to thrombosis of right middle cerebral artery: Secondary | ICD-10-CM | POA: Diagnosis not present

## 2021-01-30 DIAGNOSIS — R1310 Dysphagia, unspecified: Secondary | ICD-10-CM | POA: Diagnosis present

## 2021-01-30 DIAGNOSIS — F17210 Nicotine dependence, cigarettes, uncomplicated: Secondary | ICD-10-CM | POA: Diagnosis present

## 2021-01-30 DIAGNOSIS — G8194 Hemiplegia, unspecified affecting left nondominant side: Secondary | ICD-10-CM | POA: Diagnosis present

## 2021-01-30 DIAGNOSIS — Z20822 Contact with and (suspected) exposure to covid-19: Secondary | ICD-10-CM | POA: Diagnosis present

## 2021-01-30 DIAGNOSIS — R053 Chronic cough: Secondary | ICD-10-CM | POA: Diagnosis present

## 2021-01-30 DIAGNOSIS — F1910 Other psychoactive substance abuse, uncomplicated: Secondary | ICD-10-CM | POA: Diagnosis not present

## 2021-01-30 DIAGNOSIS — G40909 Epilepsy, unspecified, not intractable, without status epilepticus: Secondary | ICD-10-CM | POA: Diagnosis present

## 2021-01-30 DIAGNOSIS — E785 Hyperlipidemia, unspecified: Secondary | ICD-10-CM | POA: Diagnosis present

## 2021-01-30 DIAGNOSIS — E43 Unspecified severe protein-calorie malnutrition: Secondary | ICD-10-CM | POA: Diagnosis present

## 2021-01-30 DIAGNOSIS — I6381 Other cerebral infarction due to occlusion or stenosis of small artery: Principal | ICD-10-CM | POA: Diagnosis present

## 2021-01-30 DIAGNOSIS — R414 Neurologic neglect syndrome: Secondary | ICD-10-CM | POA: Diagnosis not present

## 2021-01-30 DIAGNOSIS — R4182 Altered mental status, unspecified: Secondary | ICD-10-CM | POA: Diagnosis not present

## 2021-01-30 DIAGNOSIS — R9431 Abnormal electrocardiogram [ECG] [EKG]: Secondary | ICD-10-CM | POA: Diagnosis not present

## 2021-01-30 DIAGNOSIS — R4781 Slurred speech: Secondary | ICD-10-CM | POA: Diagnosis not present

## 2021-01-30 DIAGNOSIS — I110 Hypertensive heart disease with heart failure: Secondary | ICD-10-CM | POA: Diagnosis present

## 2021-01-30 DIAGNOSIS — F17200 Nicotine dependence, unspecified, uncomplicated: Secondary | ICD-10-CM | POA: Diagnosis not present

## 2021-01-30 DIAGNOSIS — Z79899 Other long term (current) drug therapy: Secondary | ICD-10-CM

## 2021-01-30 DIAGNOSIS — I2510 Atherosclerotic heart disease of native coronary artery without angina pectoris: Secondary | ICD-10-CM | POA: Diagnosis present

## 2021-01-30 DIAGNOSIS — I6389 Other cerebral infarction: Secondary | ICD-10-CM | POA: Diagnosis not present

## 2021-01-30 DIAGNOSIS — R471 Dysarthria and anarthria: Secondary | ICD-10-CM | POA: Diagnosis present

## 2021-01-30 DIAGNOSIS — R569 Unspecified convulsions: Secondary | ICD-10-CM | POA: Diagnosis not present

## 2021-01-30 DIAGNOSIS — G319 Degenerative disease of nervous system, unspecified: Secondary | ICD-10-CM | POA: Diagnosis not present

## 2021-01-30 DIAGNOSIS — Z743 Need for continuous supervision: Secondary | ICD-10-CM | POA: Diagnosis not present

## 2021-01-30 DIAGNOSIS — Z4682 Encounter for fitting and adjustment of non-vascular catheter: Secondary | ICD-10-CM | POA: Diagnosis not present

## 2021-01-30 DIAGNOSIS — R69 Illness, unspecified: Secondary | ICD-10-CM | POA: Diagnosis not present

## 2021-01-30 DIAGNOSIS — I69322 Dysarthria following cerebral infarction: Secondary | ICD-10-CM | POA: Diagnosis not present

## 2021-01-30 DIAGNOSIS — I10 Essential (primary) hypertension: Secondary | ICD-10-CM | POA: Diagnosis not present

## 2021-01-30 DIAGNOSIS — Z716 Tobacco abuse counseling: Secondary | ICD-10-CM | POA: Diagnosis not present

## 2021-01-30 DIAGNOSIS — I69391 Dysphagia following cerebral infarction: Secondary | ICD-10-CM | POA: Diagnosis not present

## 2021-01-30 DIAGNOSIS — Z87898 Personal history of other specified conditions: Secondary | ICD-10-CM | POA: Diagnosis not present

## 2021-01-30 DIAGNOSIS — I6523 Occlusion and stenosis of bilateral carotid arteries: Secondary | ICD-10-CM | POA: Diagnosis not present

## 2021-01-30 LAB — CBC WITH DIFFERENTIAL/PLATELET
Abs Immature Granulocytes: 0.02 10*3/uL (ref 0.00–0.07)
Basophils Absolute: 0.1 10*3/uL (ref 0.0–0.1)
Basophils Relative: 1 %
Eosinophils Absolute: 0.2 10*3/uL (ref 0.0–0.5)
Eosinophils Relative: 2 %
HCT: 51.1 % (ref 39.0–52.0)
Hemoglobin: 17.3 g/dL — ABNORMAL HIGH (ref 13.0–17.0)
Immature Granulocytes: 0 %
Lymphocytes Relative: 35 %
Lymphs Abs: 3.1 10*3/uL (ref 0.7–4.0)
MCH: 29.9 pg (ref 26.0–34.0)
MCHC: 33.9 g/dL (ref 30.0–36.0)
MCV: 88.4 fL (ref 80.0–100.0)
Monocytes Absolute: 0.7 10*3/uL (ref 0.1–1.0)
Monocytes Relative: 8 %
Neutro Abs: 4.8 10*3/uL (ref 1.7–7.7)
Neutrophils Relative %: 54 %
Platelets: 248 10*3/uL (ref 150–400)
RBC: 5.78 MIL/uL (ref 4.22–5.81)
RDW: 13.2 % (ref 11.5–15.5)
WBC: 8.9 10*3/uL (ref 4.0–10.5)
nRBC: 0 % (ref 0.0–0.2)

## 2021-01-30 LAB — COMPREHENSIVE METABOLIC PANEL
ALT: 30 U/L (ref 0–44)
AST: 19 U/L (ref 15–41)
Albumin: 4 g/dL (ref 3.5–5.0)
Alkaline Phosphatase: 50 U/L (ref 38–126)
Anion gap: 9 (ref 5–15)
BUN: 10 mg/dL (ref 8–23)
CO2: 21 mmol/L — ABNORMAL LOW (ref 22–32)
Calcium: 9.2 mg/dL (ref 8.9–10.3)
Chloride: 103 mmol/L (ref 98–111)
Creatinine, Ser: 1.01 mg/dL (ref 0.61–1.24)
GFR, Estimated: >60 mL/min (ref >60)
Glucose, Bld: 87 mg/dL (ref 70–99)
Potassium: 3.8 mmol/L (ref 3.5–5.1)
Sodium: 133 mmol/L — ABNORMAL LOW (ref 135–145)
Total Bilirubin: 0.8 mg/dL (ref 0.3–1.2)
Total Protein: 7 g/dL (ref 6.5–8.1)

## 2021-01-30 LAB — I-STAT CHEM 8, ED
BUN: 11 mg/dL (ref 8–23)
Calcium, Ion: 0.9 mmol/L — ABNORMAL LOW (ref 1.15–1.40)
Chloride: 106 mmol/L (ref 98–111)
Creatinine, Ser: 1 mg/dL (ref 0.61–1.24)
Glucose, Bld: 87 mg/dL (ref 70–99)
HCT: 49 % (ref 39.0–52.0)
Hemoglobin: 16.7 g/dL (ref 13.0–17.0)
Potassium: 4.4 mmol/L (ref 3.5–5.1)
Sodium: 134 mmol/L — ABNORMAL LOW (ref 135–145)
TCO2: 24 mmol/L (ref 22–32)

## 2021-01-30 LAB — RESP PANEL BY RT-PCR (FLU A&B, COVID) ARPGX2
Influenza A by PCR: NEGATIVE
Influenza B by PCR: NEGATIVE
SARS Coronavirus 2 by RT PCR: NEGATIVE

## 2021-01-30 LAB — CBG MONITORING, ED: Glucose-Capillary: 88 mg/dL (ref 70–99)

## 2021-01-30 MED ORDER — IOHEXOL 350 MG/ML SOLN
65.0000 mL | Freq: Once | INTRAVENOUS | Status: AC | PRN
  Administered 2021-01-30: 22:00:00 65 mL via INTRAVENOUS

## 2021-01-30 MED ORDER — ACETAMINOPHEN 650 MG RE SUPP: 650.0000 mg | RECTAL | Status: DC | PRN

## 2021-01-30 MED ORDER — SODIUM CHLORIDE 0.9% FLUSH
3.0000 mL | Freq: Once | INTRAVENOUS | Status: DC
Start: 2021-01-30 — End: 2021-02-06

## 2021-01-30 MED ORDER — PANTOPRAZOLE SODIUM 40 MG IV SOLR
40.0000 mg | Freq: Every day | INTRAVENOUS | Status: DC
  Administered 2021-01-31 – 2021-02-01 (×3): 40 mg via INTRAVENOUS
  Filled 2021-01-30 (×3): qty 40

## 2021-01-30 MED ORDER — LABETALOL HCL 5 MG/ML IV SOLN
10.0000 mg | Freq: Once | INTRAVENOUS | Status: AC
  Administered 2021-01-30: 22:00:00 10 mg via INTRAVENOUS

## 2021-01-30 MED ORDER — LEVETIRACETAM IN NACL 1500 MG/100ML IV SOLN
1500.0000 mg | Freq: Once | INTRAVENOUS | Status: AC
  Administered 2021-01-30: 23:00:00 1500 mg via INTRAVENOUS
  Filled 2021-01-30: qty 100

## 2021-01-30 MED ORDER — SENNOSIDES-DOCUSATE SODIUM 8.6-50 MG PO TABS: 1.0000 | ORAL_TABLET | Freq: Every evening | ORAL | Status: DC | PRN

## 2021-01-30 MED ORDER — ACETAMINOPHEN 160 MG/5ML PO SOLN: 650.0000 mg | ORAL | Status: DC | PRN

## 2021-01-30 MED ORDER — SODIUM CHLORIDE 0.9 % IV SOLN: INTRAVENOUS | Status: AC

## 2021-01-30 MED ORDER — TENECTEPLASE FOR STROKE
0.2500 mg/kg | PACK | Freq: Once | INTRAVENOUS | Status: AC
  Administered 2021-01-30: 22:00:00 15 mg via INTRAVENOUS
  Filled 2021-01-30: qty 10

## 2021-01-30 MED ORDER — STROKE: EARLY STAGES OF RECOVERY BOOK
Freq: Once | Status: AC
  Administered 2021-01-31: 02:00:00 1
  Filled 2021-01-30 (×2): qty 1

## 2021-01-30 MED ORDER — ACETAMINOPHEN 325 MG PO TABS: 650.0000 mg | ORAL_TABLET | ORAL | Status: DC | PRN

## 2021-01-30 NOTE — ED Notes (Signed)
2243. This RN was at patient bedside, I had handed the phone to speak with his family, while on the phone, his speech became slurred, noticed left facial drooping and gaze. Notified EDP, koomar to room to assess, new orders for CT, Neuro updated. Pt taken to CT at 2251 for repeat scan. Symptoms lasted about 4-5 minutes then resolved. Pt now alert/ speech clear, MAE.

## 2021-01-30 NOTE — ED Notes (Signed)
As we were transferring pt from ct table to bed, pt started to slur his speech again, resolved quickly.

## 2021-01-30 NOTE — ED Provider Notes (Signed)
[facility] EMERGENCY DEPARTMENT Provider Note   CSN: [id number] Arrival date & time: 01/30/21  2136  An emergency department physician performed an initial assessment on this suspected stroke patient at 2140.  History Chief Complaint  Patient presents with   Code Stroke    Douglas Spencer is a 75 y.o. male who presents the emergency department as a stroke alert.  Last known well approximately 2045 with initial deficits of right-sided gaze, left-sided weakness and drift, slurred speech.  Patient able to [facility] during the symptoms and is actively displaying the symptoms here in the emergency department.  Patient with an apparent seizure history but is no longer taking Keppra due to negative side effects.  HPI     No past medical history on file.  Patient Active Problem List   Diagnosis Date Noted   Stroke determined by clinical assessment ([place]) 01/30/2021    No past surgical history on file.     No family history on file.  Social History   Tobacco Use   Smoking status: Every Day    Packs/day: 0.50    Types: Cigarettes  Vaping Use   Vaping Use: Never used  Substance Use Topics   Alcohol use: Yes    Comment: pt states he drinks multiple beers every day.   Drug use: Yes    Types: Marijuana    Home Medications Prior to Admission medications   Medication Sig Start Date End Date Taking? Authorizing Provider  levETIRAcetam (KEPPRA) 500 MG tablet TAKE 1 TABLET (500 MG TOTAL) BY MOUTH TWO TIMES DAILY. 02/28/20 02/27/21  Quintella Reichert, MD    Allergies    Patient has no known allergies.  Review of Systems   Review of Systems  Constitutional:  Negative for chills and fever.  HENT:  Negative for ear pain and sore throat.   Eyes:  Negative for pain and visual disturbance.  Respiratory:  Negative for cough and shortness of breath.   Cardiovascular:  Negative for chest pain and palpitations.  Gastrointestinal:  Negative for abdominal pain and  vomiting.  Genitourinary:  Negative for dysuria and hematuria.  Musculoskeletal:  Negative for arthralgias and back pain.  Skin:  Negative for color change and rash.  Neurological:  Positive for weakness. Negative for seizures and syncope.       Gaze palsy  All other systems reviewed and are negative.  Physical Exam Updated Vital Signs BP (!) 138/98    Pulse 71    Temp 97.8 F (36.6 C)    Resp 19    Ht 5\' 8"  (1.727 m)    Wt 59.6 kg    SpO2 100%    BMI 19.98 kg/m   Physical Exam Vitals and nursing note reviewed.  Constitutional:      General: He is not in acute distress.    Appearance: He is well-developed.  HENT:     Head: Normocephalic and atraumatic.  Eyes:     Conjunctiva/sclera: Conjunctivae normal.  Cardiovascular:     Rate and Rhythm: Normal rate and regular rhythm.     Heart sounds: No murmur heard. Pulmonary:     Effort: Pulmonary effort is normal. No respiratory distress.     Breath sounds: Normal breath sounds.  Abdominal:     Palpations: Abdomen is soft.     Tenderness: There is no abdominal tenderness.  Musculoskeletal:        General: No swelling.     Cervical back: Neck supple.  Skin:    General:  Skin is warm and dry.     Capillary Refill: Capillary refill takes less than 2 seconds.  Neurological:     Mental Status: He is alert and oriented to person, place, and time.     Motor: Weakness present.     Comments: Left-sided gaze palsy and hemineglect  Psychiatric:        Mood and Affect: Mood normal.    ED Results / Procedures / Treatments   Labs (all labs ordered are listed, but only abnormal results are displayed) Labs Reviewed  CBC WITH DIFFERENTIAL/PLATELET - Abnormal; Notable for the following components:      Result Value   Hemoglobin 17.3 (*)    All other components within normal limits  COMPREHENSIVE METABOLIC PANEL - Abnormal; Notable for the following components:   Sodium 133 (*)    CO2 21 (*)    All other components within normal limits   I-STAT CHEM 8, ED - Abnormal; Notable for the following components:   Sodium 134 (*)    Calcium, Ion 0.90 (*)    All other components within normal limits  RESP PANEL BY RT-PCR (FLU A&B, COVID) ARPGX2  PROTIME-INR  APTT  HEMOGLOBIN A1C  LIPID PANEL  CBG MONITORING, ED    EKG None  Radiology CT Head Wo Contrast  Result Date: 01/30/2021 CLINICAL DATA:  Left hemineglect, stroke status post tPA administration EXAM: CT HEAD WITHOUT CONTRAST TECHNIQUE: Contiguous axial images were obtained from the base of the skull through the vertex without intravenous contrast. COMPARISON:  9:46 p.m. FINDINGS: Brain: Normal anatomic configuration. Parenchymal volume loss is commensurate with the patient's age. Mild periventricular white matter changes are present likely reflecting the sequela of small vessel ischemia. Remote left cerebellar infarct again noted. No abnormal intra or extra-axial mass lesion or fluid collection. No abnormal mass effect or midline shift. No evidence of acute intracranial hemorrhage or infarct. Ventricular size is normal. Cerebellum unremarkable. Vascular: No asymmetric hyperdense vasculature at the skull base. Skull: Intact Sinuses/Orbits: Mild mucosal thickening within the frontal sinuses. Remaining paranasal sinuses are clear. Orbits are unremarkable. Other: Mastoid air cells and middle ear cavities are clear. IMPRESSION: No acute intracranial hemorrhage or infarct. Stable mild senescent change. Unchanged remote left cerebellar infarct. Electronically Signed   By: Fidela Salisbury M.D.   On: 01/30/2021 23:13   CT HEAD CODE STROKE WO CONTRAST  Result Date: 01/30/2021 CLINICAL DATA:  Code stroke. EXAM: CT HEAD WITHOUT CONTRAST TECHNIQUE: Contiguous axial images were obtained from the base of the skull through the vertex without intravenous contrast. COMPARISON:  Prior MRI from 02/28/2020 FINDINGS: Brain: Age-related cerebral atrophy with mild chronic small vessel ischemic disease.  Remote left cerebellar infarct. No acute intracranial hemorrhage. No acute large vessel territory infarct. No mass lesion, midline shift or mass effect. No hydrocephalus or extra-axial fluid collection. Vascular: No hyperdense vessel. Skull: Scalp soft tissues and calvarium demonstrate no acute finding. Sinuses/Orbits: Right gaze noted. Mild scattered mucosal thickening noted within the ethmoidal air cells and maxillary sinuses. Paranasal sinuses are otherwise largely clear. No mastoid effusion. Other: None. ASPECTS [facility] Stroke Program Early CT Score) - Ganglionic level infarction (caudate, lentiform nuclei, internal capsule, insula, M1-M3 cortex): 7 - Supraganglionic infarction (M4-M6 cortex): 3 Total score (0-10 with 10 being normal): 10 IMPRESSION: 1. No acute intracranial abnormality. 2. ASPECTS is 10. 3. Age-related cerebral atrophy with chronic microvascular ischemic disease, with small remote left cerebellar infarct. These results were communicated to Dr. Lorrin Goodell at 10:01 pm on 01/30/2021 by text page  via the [employer] system. Electronically Signed   By: Jeannine Boga M.D.   On: 01/30/2021 22:04   CT ANGIO HEAD NECK W WO CM (CODE STROKE)  Result Date: 01/30/2021 CLINICAL DATA:  Initial evaluation for neuro deficit, stroke suspected. EXAM: CT ANGIOGRAPHY HEAD AND NECK TECHNIQUE: Multidetector CT imaging of the head and neck was performed using the standard protocol during bolus administration of intravenous contrast. Multiplanar CT image reconstructions and MIPs were obtained to evaluate the vascular anatomy. Carotid stenosis measurements (when applicable) are obtained utilizing NASCET criteria, using the distal internal carotid diameter as the denominator. CONTRAST:  22mL OMNIPAQUE IOHEXOL 350 MG/ML SOLN COMPARISON:  Head CT from earlier the same day. FINDINGS: CTA NECK FINDINGS Aortic arch: Visualized aortic arch normal caliber with normal 3 vessel morphology. Mild plaque about the  arch itself. No hemodynamically significant stenosis about the origin the great vessels. Right carotid system: Right common and internal carotid arteries patent without stenosis or dissection. Mild for age atheromatous change about the right carotid bulb without stenosis. Left carotid system: Left common and internal carotid arteries patent without stenosis or dissection. Minimal for age atheromatous change about the left carotid bulb without stenosis. Vertebral arteries: Both vertebral arteries arise from the subclavian arteries. No significant proximal subclavian artery stenosis. Vertebral arteries patent without stenosis, dissection or occlusion. Skeleton: No worrisome lytic or blastic osseous lesions. Mild for age cervical spondylosis. Patient is edentulous. Other neck: No other acute soft tissue abnormality within the neck. Upper chest: Visualized upper chest demonstrates no acute finding. Review of the MIP images confirms the above findings CTA HEAD FINDINGS Anterior circulation: Both internal carotid arteries widely patent to the termini without stenosis. A1 segments widely patent. Normal anterior communicating artery complex. Both anterior cerebral arteries widely patent to their distal aspects without stenosis. No M1 stenosis or occlusion. Normal MCA bifurcations. Distal MCA branches well perfused and symmetric. Posterior circulation: Both V4 segments patent to the vertebrobasilar junction without stenosis. Both PICA origins patent and normal. Basilar widely patent to its distal aspect without stenosis. Superior cerebellar arteries patent bilaterally. Both PCAs primarily supplied via the basilar and are well perfused to there distal aspects. Venous sinuses: Patent allowing for timing the contrast bolus. Anatomic variants: None significant.  No aneurysm. Review of the MIP images confirms the above findings IMPRESSION: 1. Negative CTA for large vessel occlusion. 2. Mild for age atheromatous change about the  carotid bifurcations without hemodynamically significant stenosis. Critical Value/emergent results were called by telephone at the time of interpretation on 01/30/2021 at 10:07 pm to provider [facility] , who verbally acknowledged these results. Electronically Signed   By: Jeannine Boga M.D.   On: 01/30/2021 22:19    Procedures .Critical Care Performed by: Teressa Lower, MD Authorized by: Teressa Lower, MD   Critical care provider statement:    Critical care time (minutes):  30   Critical care was necessary to treat or prevent imminent or life-threatening deterioration of the following conditions: stroke with TNK.   Critical care was time spent personally by me on the following activities:  Development of treatment plan with patient or surrogate, discussions with consultants, evaluation of patient's response to treatment, examination of patient, ordering and review of laboratory studies, ordering and review of radiographic studies, ordering and performing treatments and interventions, pulse oximetry, re-evaluation of patient's condition and review of old charts   Medications Ordered in ED Medications  sodium chloride flush (NS) 0.9 % injection 3 mL (3 mLs Intravenous Not Given  01/30/21 2236)   stroke: mapping our early stages of recovery book (has no administration in time range)  0.9 %  sodium chloride infusion ( Intravenous New Bag/Given 01/30/21 2236)  acetaminophen (TYLENOL) tablet 650 mg (has no administration in time range)    Or  acetaminophen (TYLENOL) 160 MG/5ML solution 650 mg (has no administration in time range)    Or  acetaminophen (TYLENOL) suppository 650 mg (has no administration in time range)  senna-docusate (Senokot-S) tablet 1 tablet (has no administration in time range)  pantoprazole (PROTONIX) injection 40 mg (has no administration in time range)  iohexol (OMNIPAQUE) 350 MG/ML injection 65 mL (65 mLs Intravenous Contrast Given 01/30/21 2201)   tenecteplase (TNKASE) injection for Stroke 15 mg (15 mg Intravenous Given 01/30/21 2207)  levETIRAcetam (KEPPRA) IVPB 1500 mg/ 100 mL premix (0 mg Intravenous Stopped 01/30/21 2309)  labetalol (NORMODYNE) injection 10 mg (10 mg Intravenous Given 01/30/21 2222)    ED Course  I have reviewed the triage vital signs and the nursing notes.  Pertinent labs & imaging results that were available during my care of the patient were reviewed by me and considered in my medical decision making (see chart for details).  Clinical Course as of 01/30/21 2330  Tue Jan 30, 2021  2150 LKW 845PM [MK]  2317 BUN: 11 [MK]    Clinical Course User Index [MK] Chrisette Man, Debe Coder, MD   MDM Rules/Calculators/A&P                          Patient seen in the emergency department as a stroke alert.  Physical exam reveals left-sided gaze palsy and hemineglect with slurred speech.  Initial CT stroke head negative and as patient is within the window he received TNKase here in the emergency department.  CT angio unremarkable.  Laboratory evaluation largely unremarkable.  COVID and flu negative.  While here in the emergency department, patient had a rapid improvement back to her normal neurologic baseline but had multiple episodes of slurred speech and gaze palsy with no postictal state.  Concern for partial seizure versus functional neurologic disorder.  Patient will be admitted to the stroke service after receiving TNKase.   Final Clinical Impression(s) / ED Diagnoses Final diagnoses:  None    Rx / DC Orders ED Discharge Orders     None        Iness Pangilinan, Debe Coder, MD 01/30/21 [phone]

## 2021-01-30 NOTE — H&P (Addendum)
NEUROLOGY CONSULTATION NOTE   Date of service: January 30, 2021 Patient Name: Douglas Spencer MRN:  [id number] DOB:  [birth year] Reason for consult: "Stroke code" Requesting Provider: Teressa Lower, MD _ _ _   _ __   _ __ _ _  __ __   _ __   __ _  History of Present Illness  Douglas Spencer is a 75 y.o. male with PMH significant for prior history of alcohol use but he quit drinking in January 2022, prior history of polysubstance abuse and smoker, prior history of seizure and noncompliant with his Keppra.  Patient lives with his friend and friend saw him walking in the house at his baseline at 2045.  He went out and walked back into the house and found him laying on the floor and not moving his left side.  No witnessed seizure activity.  On chart review appears that in January 2022, he was evaluated by her team for seizure activity with resultant right-sided weakness but his symptoms rapidly improved.  On arrival, patient is awake alert and talkative.  He is able to move his right upper extremity and right lower extremity spontaneously with no aphasia.  He is plegic in left upper extremity and left lower extremity with gaze deviation to the right and does not cross midline.  Stat CT head without contrast was obtained and was negative for an acute ICH.  Aspects of 10. He was evaluated after CTH and was noted to have persistent left-sided weakness with right gaze deviation and some concern for potential extinction/neglect with no improvement in his symptoms.  tNKase was discussed with patient and at his request with family including his brother Mr. Douglas Spencer([phone]) and with patient's cousin Ms. [phone]). Patient and family consented to tNKASE. Slight delay in tNKAse administration due to having to discuss with multiple family members at patient's request.  mRS: 0 tNKASE: offered and given Thrombectomy: not offered 2/2 no LVO. NIHSS components Score: Comment  1a Level of  Conscious 0[x]  1[]  2[]  3[]      1b LOC Questions 0[x]  1[]  2[]       1c LOC Commands 0[x]  1[]  2[]       2 Best Gaze 0[]  1[]  2[x]       3 Visual 0[x]  1[]  2[]  3[]      4 Facial Palsy 0[]  1[x]  2[]  3[]      5a Motor Arm - left 0[]  1[x]  2[]  3[]  4[]  UN[]    5b Motor Arm - Right 0[x]  1[]  2[]  3[]  4[]  UN[]    6a Motor Leg - Left 0[]  1[]  2[x]  3[]  4[]  UN[]    6b Motor Leg - Right 0[]  1[]  2[]  3[]  4[]  UN[]    7 Limb Ataxia 0[x]  1[]  2[]  3[]  UN[]     8 Sensory 0[]  1[x]  2[]  UN[]      9 Best Language 0[x]  1[]  2[]  3[]      10 Dysarthria 0[x]  1[]  2[]  UN[]      11 Extinct. and Inattention 0[]  1[x]  2[]       TOTAL: 8      ROS   Constitutional Denies weight loss, fever and chills.   HEENT Denies changes in vision and hearing.   Respiratory Denies SOB and cough.   CV Denies palpitations and CP   GI Denies abdominal pain, nausea, vomiting and diarrhea.   GU Denies dysuria and urinary frequency.   MSK Denies myalgia and joint pain.   Skin Denies rash and pruritus.   Neurological Denies headache and syncope.   Psychiatric Denies recent changes in mood. Denies anxiety  and depression.    Past History  No past medical history on file. No past surgical history on file. No family history on file. Social History   Socioeconomic History   Marital status: Single    Spouse name: Not on file   Number of children: Not on file   Years of education: Not on file   Highest education level: Not on file  Occupational History   Not on file  Tobacco Use   Smoking status: Every Day    Packs/day: 0.50    Types: Cigarettes   Smokeless tobacco: Not on file  Vaping Use   Vaping Use: Never used  Substance and Sexual Activity   Alcohol use: Yes    Comment: pt states he drinks multiple beers every day.   Drug use: Yes    Types: Marijuana   Sexual activity: Not on file  Other Topics Concern   Not on file  Social History Narrative   Not on file   Social Determinants of Health   Financial Resource Strain: Not  on file  Food Insecurity: Not on file  Transportation Needs: Not on file  Physical Activity: Not on file  Stress: Not on file  Social Connections: Not on file   No Known Allergies  Medications  (Not in a hospital admission)    Vitals   Vitals:   01/30/21 2245 01/30/21 2300 01/30/21 2315 01/30/21 2330  BP: (!) 159/89 (!) 161/84 (!) 138/98 (!) 151/94  Pulse: 66 65 71 74  Resp: (!) 22 19 19 20   Temp:      SpO2: 100% 94% 100% 97%  Weight:      Height:         Body mass index is 19.98 kg/m.  Physical Exam   General: Laying comfortably in bed; in no acute distress.  HENT: Normal oropharynx and mucosa. Normal external appearance of ears and nose.  Neck: Supple, no pain or tenderness  CV: No JVD. No peripheral edema.  Pulmonary: Symmetric Chest rise. Normal respiratory effort.  Abdomen: Soft to touch, non-tender.  Ext: No cyanosis, edema, or deformity  Skin: No rash. Normal palpation of skin.   Musculoskeletal: Normal digits and nails by inspection. No clubbing.   Neurologic Examination  Mental status/Cognition: Alert, oriented to self, place, month and year, good attention.  Speech/language: Fluent, comprehension intact, object naming intact, repetition intact.  Cranial nerves:   CN II Pupils equal and reactive to light, no VF deficits    CN III,IV,VI EOM intact, no gaze preference or deviation, no nystagmus    CN V normal sensation in V1, V2, and V3 segments bilaterally    CN VII L facial droop   CN VIII normal hearing to speech    CN IX & X normal palatal elevation, no uvular deviation    CN XI 5/5 head turn and 5/5 shoulder shrug bilaterally    CN XII midline tongue protrusion    Motor:  Muscle bulk: poor, tone decreased in LUE and LLE,  Mvmt Root Nerve  Muscle Right Left Comments  SA C5/6 Ax Deltoid 5 4   EF C5/6 Mc Biceps 5 3   EE C6/7/8 Rad Triceps 5 3   WF C6/7 Med FCR     WE C7/8 PIN ECU     F Ab C8/T1 U ADM/FDI 5 2   HF L1/2/3 Fem Illopsoas 5 4   KE  L2/3/4 Fem Quad 5 4   DF L4/5 D Peron Tib Ant 5 4  PF S1/2 Tibial Grc/Sol 5 4    Reflexes:  Right Left Comments  Pectoralis      Biceps (C5/6) 2 2   Brachioradialis (C5/6) 2 2    Triceps (C6/7) 2 2    Patellar (L3/4) 2 2    Achilles (S1)      Hoffman      Plantar     Jaw jerk    Sensation:  Light touch Decreased in LUE and LLE   Pin prick    Temperature    Vibration   Proprioception    Coordination/Complex Motor:  - Finger to Nose intact in RUE and unable to do with LUE - Heel to shin unable to do - Rapid alternating movement are slowed in LUE and LLE - Gait: deferred for patient safety.  Labs   CBC:  Recent Labs  Lab 01/30/21 2144 01/30/21 2148  WBC 8.9  --   NEUTROABS 4.8  --   HGB 17.3* 16.7  HCT 51.1 49.0  MCV 88.4  --   PLT 248  --     Basic Metabolic Panel:  Lab Results  Component Value Date   NA 134 (L) 01/30/2021   K 4.4 01/30/2021   CO2 21 (L) 01/30/2021   GLUCOSE 87 01/30/2021   BUN 11 01/30/2021   CREATININE 1.00 01/30/2021   CALCIUM 9.2 01/30/2021   GFRNONAA >60 01/30/2021   Lipid Panel: No results found for: LDLCALC HgbA1c: No results found for: HGBA1C Urine Drug Screen:     Component Value Date/Time   LABOPIA NONE DETECTED 02/28/2020 0155   COCAINSCRNUR POSITIVE (A) 02/28/2020 0155   LABBENZ NONE DETECTED 02/28/2020 0155   AMPHETMU POSITIVE (A) 02/28/2020 0155   THCU POSITIVE (A) 02/28/2020 0155   LABBARB NONE DETECTED 02/28/2020 0155    Alcohol Level     Component Value Date/Time   ETH <10 02/27/2020 2129    CT Head without contrast(Personally reviewed): CTH was negative for a large hypodensity concerning for a large territory infarct or hyperdensity concerning for an ICH  CT angio Head and Neck with contrast(Personally reviewed): No LVO  MRI Brain: pending  cEEG:  pending  Impression   Douglas Spencer is a 75 y.o. male with PMH significant for prior history of alcohol use but he quit drinking in January 2022,  prior history of polysubstance abuse and smoker, prior history of seizure and noncompliant with his Keppra who presents with acute L sided weakness + R gaze deviation and extinction on the left. His prior seizure involved R sided twitching with post ictal weakness on the right. He also does not have a PCP.  Symptoms were persistent with no improvement and tNKASE was offered. Not a candidate for thrombectomy 2/2 no LVO.  He had noticeable improvement in his symptoms after tNKASE. Had a brief episode of SBP at 180 after tNKASe and was given Labetalol 10mg  IV once.  He also had another brief episode of L sided weakness for 4-5 mins with spontaneous resolution. STAT CTH was obtained which was negative for ICH. He was loaded with Keppra 1500mg  IV once.  Impression: - Stroke determined by clinical criteria. - Rule out seizure. - Medication non compliance. - History of Polysubstance abuse.  Recommendations  Plan: - Frequent NeuroChecks for post tNK care per stroke unit protocol: - Initial CTH demonstrated no acute hemorrhage or mass - MRI Brain - pending - CTA - no LVO - TTE - pending - Lipid Panel: LDL - pending.  - Statin: if LDL >  70 - HbA1c: pending. - Antithrombotic: Start ASA 81 mg daily if 24 h CTH does not show acute hemorrhage - DVT prophylaxis: SCDs. Pharmacologic prophylaxis if 24 h CTH does not demonstrate acute hemorrhage - Smoking cessation: counseled on the importnce of quitting smoking to reduce risks of stroke in the future. - Systolic Blood Pressure goal: < 180 mm Hg - Telemetry monitoring for arrhythmia: 72 hours - Swallow screen - ordered - PT/OT/SLP consults  History of seizures: Happened when he was drinking alcohol so he quit drinking in Jan 2022. - Stopped taking Keppra after he ran out of it. - Loaded with Keppra 1500mg  Iv once and will start Keppra 500mg  BID. - Will get him up on cEEG since he had 2 episodes of L sided weakness with slurred speech after tNKase  with negative CTH for ICH.  Hx of polysubstance use: - Urine drug screen is pending.  Smoker:- counseled on the importance of quitting smoking. - Nicotine patch PRN.  _____________________________________________________________________  This patient is critically ill and at significant risk of neurological worsening, death and care requires constant monitoring of vital signs, hemodynamics,respiratory and cardiac monitoring, neurological assessment, discussion with family, other specialists and medical decision making of high complexity. I spent 60 minutes of neurocritical care time  in the care of  this patient. This was time spent independent of any time provided by nurse practitioner or PA.  Donnetta Simpers [facility] Pager Number [phone] 01/31/2021  12:04 AM    Thank you for the opportunity to take part in the care of this patient. If you have any further questions, please contact the neurology consultation attending.  Signed,  [place] Pager Number [phone] _ _ _   _ __   _ __ _ _  __ __   _ __   __ _

## 2021-01-30 NOTE — Progress Notes (Signed)
PHARMACIST CODE STROKE RESPONSE  Notified to mix TNK at 2203 by Dr. Lorrin Goodell Delivered TNK to RN at 2207  TNK dose = 15 mg IV over 5 seconds.   Issues/delays encountered (if applicable): n/a   Albertina Parr, PharmD., BCPS, BCCCP Clinical Pharmacist Please refer to [facility] for unit-specific pharmacist

## 2021-01-30 NOTE — ED Triage Notes (Signed)
Pt arrives from home via [facility]. Per medic report, he was LSN around 2045, right sided gaze, left sided weakness/drift, slurred speech, able to answer questions, gait affected. En route 176/110, hr 124, 98% on RA, resp 16

## 2021-01-31 ENCOUNTER — Inpatient Hospital Stay (HOSPITAL_COMMUNITY): Payer: Medicare HMO

## 2021-01-31 DIAGNOSIS — I6389 Other cerebral infarction: Secondary | ICD-10-CM

## 2021-01-31 DIAGNOSIS — R569 Unspecified convulsions: Secondary | ICD-10-CM

## 2021-01-31 LAB — ECHOCARDIOGRAM COMPLETE
AR max vel: 2.5 cm2
AV Area VTI: 2.21 cm2
AV Area mean vel: 2.45 cm2
AV Mean grad: 3 mmHg
AV Peak grad: 5.1 mmHg
Ao pk vel: 1.13 m/s
Area-P 1/2: 2.76 cm2
Height: 68 in
S' Lateral: 2.5 cm
Weight: 2102.31 oz

## 2021-01-31 LAB — HEMOGLOBIN A1C
Hgb A1c MFr Bld: 5.3 % (ref 4.8–5.6)
Mean Plasma Glucose: 105.41 mg/dL

## 2021-01-31 LAB — LIPID PANEL
Cholesterol: 152 mg/dL (ref 0–200)
HDL: 36 mg/dL — ABNORMAL LOW (ref >40)
LDL Cholesterol: 104 mg/dL — ABNORMAL HIGH (ref 0–99)
Total CHOL/HDL Ratio: 4.2 RATIO
Triglycerides: 61 mg/dL (ref <150)
VLDL: 12 mg/dL (ref 0–40)

## 2021-01-31 LAB — RAPID URINE DRUG SCREEN, HOSP PERFORMED
Amphetamines: NOT DETECTED
Barbiturates: NOT DETECTED
Benzodiazepines: NOT DETECTED
Cocaine: POSITIVE — AB
Opiates: NOT DETECTED
Tetrahydrocannabinol: POSITIVE — AB

## 2021-01-31 LAB — APTT: aPTT: 34 seconds (ref 24–36)

## 2021-01-31 LAB — PROTIME-INR
INR: 1.1 (ref 0.8–1.2)
Prothrombin Time: 13.8 seconds (ref 11.4–15.2)

## 2021-01-31 LAB — MRSA NEXT GEN BY PCR, NASAL: MRSA by PCR Next Gen: NOT DETECTED

## 2021-01-31 MED ORDER — SODIUM CHLORIDE 0.9 % IV BOLUS
500.0000 mL | Freq: Once | INTRAVENOUS | Status: AC
  Administered 2021-01-31: 10:00:00 500 mL via INTRAVENOUS

## 2021-01-31 MED ORDER — SODIUM CHLORIDE 0.9 % IV SOLN
50.0000 mg | Freq: Two times a day (BID) | INTRAVENOUS | Status: DC
  Administered 2021-02-01 – 2021-02-05 (×10): 50 mg via INTRAVENOUS
  Filled 2021-01-31 (×13): qty 5

## 2021-01-31 MED ORDER — CHLORHEXIDINE GLUCONATE CLOTH 2 % EX PADS
6.0000 | MEDICATED_PAD | Freq: Every day | CUTANEOUS | Status: DC
  Administered 2021-01-31 – 2021-02-05 (×6): 6 via TOPICAL

## 2021-01-31 MED ORDER — ASPIRIN 300 MG RE SUPP
300.0000 mg | Freq: Every day | RECTAL | Status: DC
  Administered 2021-01-31 – 2021-02-01 (×2): 300 mg via RECTAL
  Filled 2021-01-31 (×3): qty 1

## 2021-01-31 MED ORDER — SODIUM CHLORIDE 0.9 % IV SOLN
200.0000 mg | Freq: Once | INTRAVENOUS | Status: AC
  Administered 2021-01-31: 06:00:00 200 mg via INTRAVENOUS
  Filled 2021-01-31: qty 20

## 2021-01-31 MED ORDER — LEVETIRACETAM 500 MG PO TABS: 1000.0000 mg | ORAL_TABLET | Freq: Two times a day (BID) | ORAL | Status: DC

## 2021-01-31 MED ORDER — LEVETIRACETAM IN NACL 1000 MG/100ML IV SOLN
1000.0000 mg | Freq: Two times a day (BID) | INTRAVENOUS | Status: DC
  Administered 2021-01-31: 10:00:00 1000 mg via INTRAVENOUS
  Filled 2021-01-31 (×2): qty 100

## 2021-01-31 MED ORDER — LEVETIRACETAM 500 MG PO TABS: 500.0000 mg | ORAL_TABLET | Freq: Two times a day (BID) | ORAL | Status: DC

## 2021-01-31 MED ORDER — ENOXAPARIN SODIUM 40 MG/0.4ML IJ SOSY
40.0000 mg | PREFILLED_SYRINGE | Freq: Every day | INTRAMUSCULAR | Status: DC
  Administered 2021-02-01 – 2021-02-05 (×6): 40 mg via SUBCUTANEOUS
  Filled 2021-01-31 (×6): qty 0.4

## 2021-01-31 MED ORDER — SODIUM CHLORIDE 0.9 % IV SOLN: INTRAVENOUS | Status: DC

## 2021-01-31 NOTE — Progress Notes (Signed)
LTM EEG hooked up and running - no initial skin breakdown - push button tested - neuro notified. [facility] monitoring.  

## 2021-01-31 NOTE — TOC CAGE-AID Note (Addendum)
Transition of Care [facility]) - CAGE-AID Screening   Patient Details  Name: Douglas Spencer MRN: [id number] Date of Birth: 22-Aug-1945  Transition of Care [facility]) CM/SW Contact:    Sherril Heyward C Tarple[place]Carter, [place] Phone Number: 01/31/2021, 12:11 PM   Clinical Narrative: Pt is unable to participate in Cage Aid.  CSW will provide pt with resources for possible use.  Katie Moch Tarpley-Carter, MSW, LCSW-A Pronouns:  She/Her/Hers [place] Transitions of Care Clinical Social Worker Direct Number:  [phone] [email or web address]   CAGE-AID Screening: Substance Abuse Screening unable to be completed due to: : Patient unable to participate             Substance Abuse Education Offered: No  Substance abuse interventions: Scientist, clinical (histocompatibility and immunogenetics)

## 2021-01-31 NOTE — Progress Notes (Signed)
Inpatient Rehab Admissions Coordinator Note:   Per therapy recommendations pt was screened for CIR appropriateness by Shann Medal, PT, DPT.  Note limited evals at this time and I will f/u after next session for tolerance.    Shann Medal, PT, DPT [phone] 01/31/21 3:37 PM

## 2021-01-31 NOTE — Progress Notes (Signed)
Dr. Lorrin Goodell paged. Patient becoming more lethargic and left side is weaker than when he arrived to 4N. Patient still oriented and follows commands. No new orders as of yet. Will continue to monitor.

## 2021-01-31 NOTE — Progress Notes (Signed)
Contacted Dr. Lorrin Goodell to discuss 2200 CT and MRI orders. Patient on continuous EEG which does not appear to be MRI compatible. Dr. Lorrin Goodell wished to continue EEG tonight, so this RN was told to do CT tonight and hold off on MRI.

## 2021-01-31 NOTE — Progress Notes (Signed)
PT Cancellation Note  Patient Details Name: Douglas Spencer MRN: [id number] DOB: 1945/08/28   Cancelled Treatment:    Reason Eval/Treat Not Completed: Active bedrest order Will evaluate once activity orders are updated.   Henryk Ursin A. Gilford Rile PT, DPT Acute Rehabilitation Services Pager [phone] Office [phone]    Linna Hoff 01/31/2021, 8:12 AM

## 2021-01-31 NOTE — Evaluation (Signed)
Clinical/Bedside Swallow Evaluation Patient Details  Name: Douglas Spencer MRN: [id number] Date of Birth: 26-Jun-1945  Today's Date: 01/31/2021 Time: SLP Start Time (ACUTE ONLY): 20 SLP Stop Time (ACUTE ONLY): 1140 SLP Time Calculation (min) (ACUTE ONLY): 10 min  Past Medical History: No past medical history on file. Past Surgical History: No past surgical history on file. HPI:  Pt is a 75 y.o. male who presented to the ED secondary to right-sided gaze, left-sided weakness and drift, slurred speech. TNK given. 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 EEG 12/21 was WNL. 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    Assessment / Plan / Recommendation  Clinical Impression  Pt was seen for bedside swallow evaluation which was limited due to pt's difficulty maintaining alertness for extended periods. Oral mechanism exam was limited due to pt's difficulty following all the necessary commands, but left-sided facial weakness was noted. Pt demonstrated symptoms of oropharyngeal dysphagia characterized by reduced bolus awareness, oral residue, an oral and/or pharyngeal delay, and signs of aspiration with ice chips, multiple swallows with purees, and nectar thick liquids via cup. It is recommended that the pt's NPO status be maintained at this time. However, very critical meds may be given crushed in puree if pt is adequately alert. Prognosis for diet initiation was judged to be good pending improvement in alertness, and SLP will follow pt.  SLP Visit Diagnosis: Dysphagia, unspecified (R13.10)    Aspiration Risk  Moderate aspiration risk    Diet Recommendation NPO except meds   Medication Administration: Crushed with puree Supervision: Patient able to self feed Compensations: Slow rate;Small sips/bites Postural Changes:  Seated upright at 90 degrees    Other  Recommendations Oral Care Recommendations: Oral care BID    Recommendations for follow up therapy are one component of a multi-disciplinary discharge planning process, led by the attending physician.  Recommendations may be updated based on patient status, additional functional criteria and insurance authorization.  Follow up Recommendations  (Continued SLP services at level of care recommended by PT/OT)      Assistance Recommended at Discharge Frequent or constant Supervision/Assistance  Functional Status Assessment Patient has had a recent decline in their functional status and demonstrates the ability to make significant improvements in function in a reasonable and predictable amount of time.  Frequency and Duration min 2x/week  2 weeks       Prognosis Prognosis for Safe Diet Advancement: Good Barriers to Reach Goals: Cognitive deficits;Severity of deficits      Swallow Study   General Date of Onset: 01/30/21 HPI: Pt is a 75 y.o. male who presented to the ED secondary to right-sided gaze, left-sided weakness and drift, slurred speech. TNK given. 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 EEG 12/21 was WNL. 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 Type of Study: Bedside Swallow Evaluation Previous Swallow Assessment: none Diet Prior to this Study: NPO Temperature Spikes Noted: No Respiratory Status: Room air History of Recent Intubation: No Behavior/Cognition: Cooperative;Pleasant mood;Lethargic/Drowsy Oral Cavity Assessment: Within Functional Limits Oral Care Completed by SLP: No Oral Cavity - Dentition: Dentures, top;Dentures, bottom Self-Feeding Abilities: Total assist Patient Positioning: Upright in bed;Postural control adequate for testing Baseline Vocal  Quality: Low vocal  intensity Volitional Cough: Strong Volitional Swallow: Able to elicit    Oral/Motor/Sensory Function Overall Oral Motor/Sensory Function: Moderate impairment Facial ROM: Reduced left;Suspected CN VII (facial) dysfunction Facial Symmetry: Abnormal symmetry left;Suspected CN VII (facial) dysfunction Facial Strength: Reduced left;Suspected CN VII (facial) dysfunction Lingual ROM: Reduced right;Reduced left   Ice Chips Ice chips: Impaired Presentation: Spoon Oral Phase Impairments: Poor awareness of bolus   Thin Liquid Thin Liquid: Not tested    Nectar Thick Nectar Thick Liquid: Impaired Presentation: Cup;Spoon Oral Phase Impairments: Poor awareness of bolus;Reduced labial seal Oral phase functional implications: Left anterior spillage;Oral residue Pharyngeal Phase Impairments: Cough - Immediate;Throat Clearing - Delayed   Honey Thick Honey Thick Liquid: Not tested   Puree Puree: Impaired Presentation: Spoon Oral Phase Impairments: Poor awareness of bolus Oral Phase Functional Implications: Oral residue Pharyngeal Phase Impairments: Multiple swallows   Solid     Solid: Not tested     Sharel Behne I. Hardin Negus, [place], [place] Office number [phone] Pager [phone]  Horton Marshall 01/31/2021,12:36 PM

## 2021-01-31 NOTE — Progress Notes (Signed)
EEG complete - results pending 

## 2021-01-31 NOTE — Progress Notes (Signed)
°  Echocardiogram 2D Echocardiogram has been performed.  Merrie Roof F 01/31/2021, 5:55 PM

## 2021-01-31 NOTE — Progress Notes (Signed)
°  Transition of Care [facility]) Screening Note   Patient Details  Name: Willam Munford Date of Birth: 10/31/1945   Transition [facility] Care [facility]) CM/SW Contact:    Benard Halsted, LCSW Phone Number: 01/31/2021, 9:57 AM    Transition of Care Department [facility]) has reviewed patient and no TOC needs have been identified at this time. We will continue to monitor patient advancement through interdisciplinary progression rounds. If new patient transition needs arise, please place a TOC consult.

## 2021-01-31 NOTE — Evaluation (Signed)
Physical Therapy Evaluation Patient Details Name: Douglas Spencer MRN: [id number] DOB: 05-26-1945 Today's Date: 01/31/2021  History of Present Illness  75 y.o. male who presented to the ED on 12/20 secondary to right-sided gaze, left-sided weakness and drift, slurred speech. TNK given. 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 EEG 12/21 was WNL. 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  Clinical Impression  Patient admitted with above diagnosis. Session limited Dr. Leonie Man requesting no OOB mobility this date. Patient presents with L sided weakness, impaired balance, decreased activity tolerance, expressive difficulties, and impaired cognition. Patient required modA for bed mobility and up to maxA to maintain sitting balance due to L lateral lean. Patient following simple commands with increased time but limited by lethargy. Patient will benefit from skilled PT services during acute stay to address listed deficits. Recommend CIR at discharge to maximize functional mobility and safety.        Recommendations for follow up therapy are one component of a multi-disciplinary discharge planning process, led by the attending physician.  Recommendations may be updated based on patient status, additional functional criteria and insurance authorization.  Follow Up Recommendations Acute inpatient rehab (3hours/day)    Assistance Recommended at Discharge Frequent or constant Supervision/Assistance  Functional Status Assessment Patient has had a recent decline in their functional status and demonstrates the ability to make significant improvements in function in a reasonable and predictable amount of time.  Equipment Recommendations  Other (comment) (TBD)    Recommendations for Other Services Rehab consult     Precautions /  Restrictions Precautions Precautions: Fall Precaution Comments: EEG Restrictions Weight Bearing Restrictions: No      Mobility  Bed Mobility Overal bed mobility: Needs Assistance Bed Mobility: Rolling;Sidelying to Sit;Sit to Sidelying Rolling: Mod assist Sidelying to sit: Mod assist     Sit to sidelying: Mod assist      Transfers                   General transfer comment: Dr. Leonie Man requested no OOB mobility this date    Ambulation/Gait                  Stairs            Wheelchair Mobility    Modified Rankin (Stroke Patients Only) Modified Rankin (Stroke Patients Only) Pre-Morbid Rankin Score: No symptoms Modified Rankin: Severe disability     Balance Overall balance assessment: Needs assistance Sitting-balance support: Feet unsupported;Single extremity supported Sitting balance-Leahy Scale: Poor Sitting balance - Comments: maxA to maintain sitting balance due to L lateral lean Postural control: Posterior lean;Left lateral lean                                   Pertinent Vitals/Pain Pain Assessment: No/denies pain    Home Living Family/patient expects to be discharged to:: Private residence Living Arrangements: Spouse/significant other Available Help at Discharge: Family Type of Home: House Home Access: Stairs to enter   Technical brewer of Steps: 1 Alternate Level Stairs-Number of Steps: flight Home Layout: Two level   Additional Comments: information limited by patient's lethargy    Prior Function Prior Level of Function : Independent/Modified Independent  Hand Dominance   Dominant Hand: Right    Extremity/Trunk Assessment   Upper Extremity Assessment Upper Extremity Assessment: Defer to OT evaluation LUE Deficits / Details: minimal gross grasp, no other AROM noted to L UE LUE Sensation: WNL LUE Coordination: decreased fine motor;decreased gross motor    Lower  Extremity Assessment Lower Extremity Assessment: LLE deficits/detail LLE Deficits / Details: grossly 2-/5. Full assessment limited by lethargy LLE Sensation: WNL LLE Coordination: decreased fine motor;decreased gross motor    Cervical / Trunk Assessment Cervical / Trunk Assessment: Kyphotic  Communication   Communication: Expressive difficulties  Cognition Arousal/Alertness: Lethargic Behavior During Therapy: Flat affect Overall Cognitive Status: Difficult to assess                                 General Comments: following commands with increased. Answering questions appropriately. A&Ox4 Functional Status Assessment: Patient has had a recent decline in their functional status and demonstrates the ability to make significant improvements in function in a reasonable and predictable amount of time.      General Comments      Exercises     Assessment/Plan    PT Assessment Patient needs continued PT services  PT Problem List Decreased strength;Decreased activity tolerance;Decreased balance;Decreased mobility;Decreased coordination;Decreased cognition;Decreased knowledge of use of DME;Decreased safety awareness;Decreased knowledge of precautions       PT Treatment Interventions DME instruction;Gait training;Stair training;Functional mobility training;Therapeutic activities;Therapeutic exercise;Balance training;Neuromuscular re-education;Patient/family education;Wheelchair mobility training    PT Goals (Current goals can be found in the Care Plan section)  Acute Rehab PT Goals Patient Stated Goal: did not state PT Goal Formulation: With patient Time For Goal Achievement: 02/14/21 Potential to Achieve Goals: Good    Frequency Min 4X/week   Barriers to discharge        Co-evaluation PT/OT/SLP Co-Evaluation/Treatment: Yes Reason for Co-Treatment: Complexity of the patient's impairments (multi-system involvement);For patient/therapist safety;To address  functional/ADL transfers PT goals addressed during session: Mobility/safety with mobility OT goals addressed during session: ADL's and self-care       AM-PAC PT "6 Clicks" Mobility  Outcome Measure Help needed turning from your back to your side while in a flat bed without using bedrails?: A Lot Help needed moving from lying on your back to sitting on the side of a flat bed without using bedrails?: A Lot Help needed moving to and from a bed to a chair (including a wheelchair)?: Total Help needed standing up from a chair using your arms (e.g., wheelchair or bedside chair)?: Total Help needed to walk in hospital room?: Total Help needed climbing 3-5 steps with a railing? : Total 6 Click Score: 8    End of Session Equipment Utilized During Treatment: Other (comment) (EEG) Activity Tolerance: Patient limited by lethargy Patient left: in bed;with call bell/phone within reach;with bed alarm set Nurse Communication: Mobility status PT Visit Diagnosis: Unsteadiness on feet (R26.81);Muscle weakness (generalized) (M62.81);Difficulty in walking, not elsewhere classified (R26.2)    Time: 0277-4128 PT Time Calculation (min) (ACUTE ONLY): 17 min   Charges:   PT Evaluation $PT Eval Moderate Complexity: 1 Mod          Melicia Esqueda A. Gilford Rile PT, DPT Acute Rehabilitation Services Pager [phone] Office [phone]   Douglas Spencer 01/31/2021, 1:26 PM

## 2021-01-31 NOTE — Progress Notes (Signed)
LTM maintain at bedside. No skin breakdown noted. Results pending.

## 2021-01-31 NOTE — Progress Notes (Signed)
STROKE TEAM PROGRESS NOTE   INTERVAL HISTORY No family is at the bedside. Patient is currently connected to an EEG and the plan for an MRI today. Patient became more lethargic overnight and went down for a repeat head CT which showed evolution of his current stroke with no hemorrhage. SLP to eval patient, consider cortrak based on mental status. MRI/MRA ordered.  Patient remains sleepy but can be aroused easily and has moderate dysarthria, left facial weakness and left hemiplegia.  CT scan shows evolving right basal ganglia infarct.  CT angiogram earlier had not shown any large vessel occlusion.  Urine drug screen was positive for marijuana and cocaine  Vitals:   01/31/21 0600 01/31/21 0630 01/31/21 0700 01/31/21 0800  BP: 140/80 138/84 138/88 (!) 146/86  Pulse: 69 (!) 59 66 62  Resp: 16 17 16 13   Temp:    97.8 F (36.6 C)  TempSrc:    Axillary  SpO2: 97% 97% 97% 96%  Weight:      Height:       CBC:  Recent Labs  Lab 01/30/21 2144 01/30/21 2148  WBC 8.9  --   NEUTROABS 4.8  --   HGB 17.3* 16.7  HCT 51.1 49.0  MCV 88.4  --   PLT 248  --    Basic Metabolic Panel:  Recent Labs  Lab 01/30/21 2144 01/30/21 2148  NA 133* 134*  K 3.8 4.4  CL 103 106  CO2 21*  --   GLUCOSE 87 87  BUN 10 11  CREATININE 1.01 1.00  CALCIUM 9.2  --    Lipid Panel:  Recent Labs  Lab 01/31/21 0105  CHOL 152  TRIG 61  HDL 36*  CHOLHDL 4.2  VLDL 12  LDLCALC 104*   HgbA1c:  Recent Labs  Lab 01/31/21 0105  HGBA1C 5.3   Urine Drug Screen:  Recent Labs  Lab 01/31/21 0154  LABOPIA NONE DETECTED  COCAINSCRNUR POSITIVE*  LABBENZ NONE DETECTED  AMPHETMU NONE DETECTED  THCU POSITIVE*  LABBARB NONE DETECTED    Alcohol Level No results for input(s): ETH in the last 168 hours.  IMAGING past 24 hours CT HEAD WO CONTRAST (5MM)  Result Date: 01/31/2021 CLINICAL DATA:  Mental status change, unknown cause EXAM: CT HEAD WITHOUT CONTRAST TECHNIQUE: Contiguous axial images were obtained from  the base of the skull through the vertex without intravenous contrast. COMPARISON:  CTA and CT head from yesterday (01/30/2021). FINDINGS: Brain: Interval development of a hypodensity within the posterior right putamen and adjacent white matter. No significant mass effect. No midline shift. Age indeterminate infarct in the inferior left cerebellum. No acute hemorrhage. No visible mass lesion or extra-axial fluid collection. Similar remote infarct in the left caudate. Additional patchy white matter hypoattenuation, nonspecific but compatible with chronic microvascular disease. Vascular: Hyperdense appearance of some of the vessels is favored to reflect residual contrast from prior CTA and/or artifact. Skull: No acute fracture. Sinuses/Orbits: Mild paranasal sinus mucosal thickening. Unremarkable orbits. Other: No mastoid effusions. Cerumen in bilateral external auditory canals. IMPRESSION: 1. Interval development of a hypodensity in the right putamen and adjacent white matter, concerning for acute perforator infarct. Age indeterminate infarct in the inferior left cerebellum. If the patient is able, recommend MRI to further evaluate. 2. No acute hemorrhage or significant mass effect. 3. 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 These results will be called to the ordering clinician or representative by the Radiologist Assistant, and communication documented in the PACS or [employer]. Electronically Signed  By: Margaretha Sheffield M.D.   On: 01/31/2021 08:11   CT Head Wo Contrast  Result Date: 01/30/2021 CLINICAL DATA:  Left hemineglect, stroke status post tPA administration EXAM: CT HEAD WITHOUT CONTRAST TECHNIQUE: Contiguous axial images were obtained from the base of the skull through the vertex without intravenous contrast. COMPARISON:  9:46 p.m. FINDINGS: Brain: Normal anatomic configuration. Parenchymal volume loss is commensurate with the patient's age. Mild periventricular white  matter changes are present likely reflecting the sequela of small vessel ischemia. Remote left cerebellar infarct again noted. No abnormal intra or extra-axial mass lesion or fluid collection. No abnormal mass effect or midline shift. No evidence of acute intracranial hemorrhage or infarct. Ventricular size is normal. Cerebellum unremarkable. Vascular: No asymmetric hyperdense vasculature at the skull base. Skull: Intact Sinuses/Orbits: Mild mucosal thickening within the frontal sinuses. Remaining paranasal sinuses are clear. Orbits are unremarkable. Other: Mastoid air cells and middle ear cavities are clear. IMPRESSION: No acute intracranial hemorrhage or infarct. Stable mild senescent change. Unchanged remote left cerebellar infarct. Electronically Signed   By: Fidela Salisbury M.D.   On: 01/30/2021 23:13   EEG adult  Result Date: 01/31/2021 Lora Havens, MD     01/31/2021  8:22 AM Patient Name: Douglas Spencer MRN: [id number] Epilepsy Attending: Lora Havens Referring Physician/Provider: Dr Donnetta Simpers Date: 01/31/2021 Duration: 26.32 mins Patient history: 75 y.o. 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 EEG to evaluate for seizure Level of alertness: Awake AEDs during EEG study: LEV Technical aspects: This EEG study was done with scalp electrodes positioned according to the 10-20 International system of electrode placement. Electrical activity was acquired at a sampling rate of 500Hz  and reviewed with a high frequency filter of 70Hz  and a low frequency filter of 1Hz . EEG data were recorded continuously and digitally stored. Description: The posterior dominant rhythm consists of 9-10 Hz activity of moderate voltage (25-35 uV) seen predominantly in  posterior head regions, symmetric and reactive to eye opening and eye closing. Hyperventilation and photic stimulation were not performed.   IMPRESSION: This study is within normal limits. No seizures or epileptiform discharges were seen throughout the recording. Priyanka [name]   Overnight EEG with video  Result Date: 01/31/2021 Lora Havens, MD     01/31/2021  9:07 AM Patient Name: Douglas Spencer MRN: [id number] Epilepsy Attending: Lora Havens Referring Physician/Provider: Dr Donnetta Simpers Duration: 01/31/2021 0149 to 01/31/2021 0900  Patient history: 75 y.o. 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 EEG to evaluate for seizure  Level of alertness: Awake, asleep  AEDs during EEG study: LEV  Technical aspects: This EEG study was done with scalp electrodes positioned according to the 10-20 International system of electrode placement. Electrical activity was acquired at a sampling rate of 500Hz  and reviewed with a high frequency filter of 70Hz  and a low frequency filter of 1Hz . EEG data were recorded continuously and digitally stored.  Description: The posterior dominant rhythm consists of 9-10 Hz activity of moderate voltage (25-35 uV) seen  predominantly in posterior head regions, symmetric and reactive to eye opening and eye closing. Hyperventilation and photic stimulation were not performed.    IMPRESSION: This study is within normal limits. No seizures or epileptiform discharges were seen throughout the recording.  Lora Havens   CT HEAD CODE STROKE WO CONTRAST  Result Date: 01/30/2021 CLINICAL DATA:  Code stroke. EXAM: CT HEAD WITHOUT CONTRAST TECHNIQUE: Contiguous axial images were obtained from the base of the skull through the vertex  without intravenous contrast. COMPARISON:  Prior MRI from 02/28/2020 FINDINGS: Brain: Age-related cerebral atrophy with mild chronic small vessel ischemic disease. Remote left cerebellar infarct. No acute intracranial hemorrhage. No acute large vessel territory infarct. No mass lesion, midline shift or mass effect. No hydrocephalus or extra-axial fluid collection. Vascular: No hyperdense vessel. Skull: Scalp soft tissues and calvarium demonstrate no acute finding. Sinuses/Orbits: Right gaze noted. Mild scattered mucosal thickening noted within the ethmoidal air cells and maxillary sinuses. Paranasal sinuses are otherwise largely clear. No mastoid effusion. Other: None. ASPECTS [facility] Stroke Program Early CT Score) - Ganglionic level infarction (caudate, lentiform nuclei, internal capsule, insula, M1-M3 cortex): 7 - Supraganglionic infarction (M4-M6 cortex): 3 Total score (0-10 with 10 being normal): 10 IMPRESSION: 1. No acute intracranial abnormality. 2. ASPECTS is 10. 3. Age-related cerebral atrophy with chronic microvascular ischemic disease, with small remote left cerebellar infarct. These results were communicated to Dr. Lorrin Goodell at 10:01 pm on 01/30/2021 by text page via the [facility] messaging system. Electronically Signed   By: Jeannine Boga M.D.   On: 01/30/2021 22:04   CT ANGIO HEAD NECK W WO CM (CODE STROKE)  Result Date: 01/30/2021 CLINICAL DATA:  Initial evaluation for neuro deficit, stroke suspected. EXAM: CT ANGIOGRAPHY HEAD AND NECK TECHNIQUE: Multidetector CT imaging of the head and neck was performed using the standard protocol during bolus administration of intravenous contrast. Multiplanar CT image reconstructions and MIPs were obtained to evaluate the vascular anatomy. Carotid stenosis measurements (when applicable) are obtained utilizing NASCET criteria, using the distal internal carotid diameter as the denominator. CONTRAST:  20mL OMNIPAQUE IOHEXOL 350 MG/ML SOLN COMPARISON:  Head  CT from earlier the same day. FINDINGS: CTA NECK FINDINGS Aortic arch: Visualized aortic arch normal caliber with normal 3 vessel morphology. Mild plaque about the arch itself. No hemodynamically significant stenosis about the origin the great vessels. Right carotid system: Right common and internal carotid arteries patent without stenosis or dissection. Mild for age atheromatous change about the right carotid bulb without stenosis. Left carotid system: Left common and internal carotid arteries patent without stenosis or dissection. Minimal for age atheromatous change about the left carotid bulb without stenosis. Vertebral arteries: Both vertebral arteries arise from the subclavian arteries. No significant proximal subclavian artery stenosis. Vertebral arteries patent without stenosis, dissection or occlusion. Skeleton: No worrisome lytic or blastic osseous lesions. Mild for age cervical spondylosis. Patient is edentulous. Other neck: No other acute soft tissue abnormality within the neck. Upper chest: Visualized upper chest demonstrates no acute finding. Review of the MIP images confirms the above findings CTA HEAD FINDINGS Anterior circulation: Both internal carotid arteries widely patent to the termini without stenosis. A1 segments widely patent. Normal anterior communicating artery complex. Both anterior cerebral arteries widely patent to their distal aspects without stenosis. No M1 stenosis or occlusion. Normal MCA bifurcations. Distal MCA branches well perfused and symmetric. Posterior circulation: Both V4 segments patent to the vertebrobasilar junction without stenosis. Both PICA origins patent and normal. Basilar widely patent to its distal aspect without  stenosis. Superior cerebellar arteries patent bilaterally. Both PCAs primarily supplied via the basilar and are well perfused to there distal aspects. Venous sinuses: Patent allowing for timing the contrast bolus. Anatomic variants: None significant.  No  aneurysm. Review of the MIP images confirms the above findings IMPRESSION: 1. Negative CTA for large vessel occlusion. 2. Mild for age atheromatous change about the carotid bifurcations without hemodynamically significant stenosis. Critical Value/emergent results were called by telephone at the time of interpretation on 01/30/2021 at 10:07 pm to provider [facility] , who verbally acknowledged these results. Electronically Signed   By: Jeannine Boga M.D.   On: 01/30/2021 22:19    PHYSICAL EXAM  Temp:  [97.8 F (36.6 C)-98.3 F (36.8 C)] 97.8 F (36.6 C) (12/21 0800) Pulse Rate:  [55-85] 62 (12/21 0800) Resp:  [12-22] 13 (12/21 0800) BP: (118-180)/(69-110) 146/86 (12/21 0800) SpO2:  [93 %-100 %] 96 % (12/21 0800) Weight:  [59.6 kg] 59.6 kg (12/20 2151)  Physical Exam Pleasant elderly Caucasian male not in distress. . Afebrile. Head is nontraumatic. Neck is supple without bruit.    Cardiac exam no murmur or gallop. Lungs are clear to auscultation. Distal pulses are well felt. Neurological Exam :  Patient is drowsy but can be easily aroused.  He has right gaze preference and look to the left past midline but not all the way.  Blinks to threat on the right but not on the left.  Moderate left facial weakness.  Moderate dysarthria.  Tongue midline.  Left hemiplegia with only grade 2/5 strength in the left upper and 2-3/5 strength in the left lower extremity.  Tone is diminished on the left.  Sensation is diminished in the left compared to the right.  Right plantar downgoing left upgoing.  Gait not tested ASSESSMENT/PLAN Mr. Douglas Spencer is a 75 y.o. male with history of ETOH with seizure, non compliant with keppra, polysubstance use, and smoking presenting with left side weakness. He was found on the floor by his friend unable to move his left side. LKW 2045 on 01/30/2021. On arrival to the ED he was awake, alert, and talkative and moving his right side spontaneously. He was plegic in  this left upper and lower extremity with gaze deviation to the right. TNK was given.  He became more lethargic over night on 12/21 at around 0500 and was taken for a repeat head CT and had an EEG ordered. MRI ordered for today.   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  CTA head & neck - negative for LVO MRI  Pending 2D Echo pending LDL 104 HgbA1c 5.3 VTE prophylaxis - SCDs  NPO except meds crushed in pureed No antithrombotic prior to admission, now on ASA suppository 300mg .  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 Patient advised to stop using due to stroke risk. Previous overdose in  2019 Obesity, Body mass index is 19.98 kg/m., BMI >/= 30 associated with increased stroke risk, recommend weight loss, diet and exercise as appropriate  Hx stroke/TIA Previous left basal ganglia infarct Unknown date Coronary artery disease Migraines Congestive heart failure  Other Active Problems  Hospital day # 1  Patient seen and examined by NP/APP with MD. MD to update note as needed.   Janine Ores, DNP, FNP-BC [facility] Pager: [phone]  STROKE MD NOTE :  I have personally obtained history,examined this patient, reviewed notes, independently viewed imaging studies, participated  in medical decision making and plan of care.ROS completed by me personally and pertinent positives fully documented  I have made any additions or clarifications directly to the above note. Agree with note above.  Patient presented with dysarthria and left hemiplegia due to right subcortical infarct and received IV TNKase but has not obtained substantial improvement.  He had neurological worsening last night and stat CT scan was done which was negative for any bleed but showed evolving basal ganglia infarct.  EEG was also obtained which was normal without seizure activity.  Continue close neurological monitoring and strict blood pressure control as per postthrombolytic protocol.  Mobilize in bed with therapies.  Check MRI scan of the brain echocardiogram.  Continue cardiac monitoring.  No family available at the bedside for discussion. This patient is critically ill and at significant risk of neurological worsening, death and care requires constant monitoring of vital signs, hemodynamics,respiratory and cardiac monitoring, extensive review of multiple databases, frequent neurological assessment, discussion with family, other specialists and medical decision making of high complexity.I have made any additions or clarifications directly to the above note.This critical care time does not reflect procedure time, or teaching time or supervisory time of PA/NP/Med Resident etc but could involve care discussion time.  I spent 30 minutes of neurocritical care time  in the care of  this patient.      Antony Contras, MD Medical Director [facility] Stroke Center Pager: [phone] 01/31/2021 4:14 PM   To contact Stroke Continuity provider, please refer to [email or web address] After hours, contact General Neurology

## 2021-01-31 NOTE — Procedures (Signed)
Patient Name: Douglas Spencer  MRN: [id number]  Epilepsy Attending: Lora Havens  Referring Physician/Provider: Dr Donnetta Simpers Date: 01/31/2021 Duration: 26.32 mins  Patient history: 75 y.o. 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 EEG to evaluate for seizure  Level of alertness: Awake  AEDs during EEG study: LEV  Technical aspects: This EEG study was done with scalp electrodes positioned according to the 10-20 International system of electrode placement. Electrical activity was acquired at a sampling rate of 500Hz  and reviewed with a high frequency filter of 70Hz  and a low frequency filter of 1Hz . EEG data were recorded continuously and digitally stored.   Description: The posterior dominant rhythm consists of 9-10 Hz activity of moderate voltage (25-35 uV) seen predominantly in posterior head regions, symmetric and reactive to eye opening and eye closing. Hyperventilation and photic stimulation were not performed.     IMPRESSION: This study is within normal limits. No seizures or epileptiform discharges were seen throughout the recording.  [name] [name]

## 2021-01-31 NOTE — Procedures (Addendum)
Patient Name: Douglas Spencer  MRN: [id number]  Epilepsy Attending: Lora Havens  Referring Physician/Provider: Dr Donnetta Simpers Duration: 01/31/2021 0149 to 02/01/2021 0149   Patient history: 75 y.o. 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 EEG to evaluate for seizure   Level of alertness: Awake, asleep   AEDs during EEG study: LCM   Technical aspects: This EEG study was done with scalp electrodes positioned according to the 10-20 International system of electrode placement. Electrical activity was acquired at a sampling rate of 500Hz  and reviewed with a high frequency filter of 70Hz  and a low frequency filter of 1Hz . EEG data were recorded continuously and digitally stored.    Description: The posterior dominant rhythm consists of 9-10 Hz activity of moderate voltage (25-35 uV) seen predominantly in posterior head regions, symmetric and reactive to eye opening and eye closing. Hyperventilation and photic stimulation were not performed.      IMPRESSION: This study is within normal limits. No seizures or epileptiform discharges were seen throughout the recording.   Douglas Spencer

## 2021-01-31 NOTE — Evaluation (Signed)
Occupational Therapy Evaluation Patient Details Name: Douglas Spencer MRN: [id number] DOB: 15-Aug-1945 Today's Date: 01/31/2021   History of Present Illness 75 y.o. 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 Currenlty undergoing EEG to evaluate for seizure.  MRI Brain pending.  CT Head: No acute intracranial hemorrhage or infarct. Stable mild senescent change. Unchanged remote left cerebellar  infarct.   Clinical Impression   Patient admitted for the diagnosis above.  PTA he lives in a home with his partner [name].  Home environment will need to be verified due to patient's decreased level of alertness.  In addition, further L upper extremity ROM and vision will need to further tested as well.  Patient did endorse that he needed no assist with self care, he did not drive, and that [name] took care of meals and home management.  Deficits impacting independence are listed below.  Currently he is needing up to Mod A of one for basic bed mobility and edge of bed sitting, but +2 will be needed to progress out of bed.  OT to continue efforts in the acute setting, but AIR is recommended for post acute rehab.  The patient would benefit from an aggressive multi disciplined approach to post acute rehab.       Recommendations for follow up therapy are one component of a multi-disciplinary discharge planning process, led by the attending physician.  Recommendations may be updated based on patient status, additional functional criteria and insurance authorization.   Follow Up Recommendations  Acute inpatient rehab (3hours/day)    Assistance Recommended at Discharge Frequent or constant Supervision/Assistance  Functional Status Assessment  Patient has had a recent  decline in their functional status and demonstrates the ability to make significant improvements in function in a reasonable and predictable amount of time.  Equipment Recommendations  BSC/3in1;Wheelchair (measurements OT);Wheelchair cushion (measurements OT);Tub/shower seat    Recommendations for Other Services       Precautions / Restrictions Precautions Precautions: Fall Precaution Comments: EEG Restrictions Weight Bearing Restrictions: No      Mobility Bed Mobility Overal bed mobility: Needs Assistance Bed Mobility: Rolling;Sidelying to Sit;Sit to Sidelying Rolling: Mod assist Sidelying to sit: Mod assist     Sit to sidelying: Mod assist      Transfers                   General transfer comment: NT      Balance Overall balance assessment: Needs assistance Sitting-balance support: Feet unsupported;Single extremity supported Sitting balance-Leahy Scale: Poor   Postural control: Posterior lean;Left lateral lean                                 ADL either performed or assessed with clinical judgement   ADL       Grooming: Maximal assistance;Bed level   Upper Body Bathing: Maximal assistance;Bed level       Upper Body Dressing : Maximal assistance;Bed level   Lower Body Dressing: Total assistance;Bed level                 General ADL Comments: RN recommeded sitting EOB at this time.     Vision   Vision Assessment?: Yes Eye Alignment: Impaired (  comment) Ocular Range of Motion: Restricted on the left Alignment/Gaze Preference: Gaze right Tracking/Visual Pursuits: Requires cues, head turns, or add eye shifts to track;Unable to hold eye position out of midline;Decreased smoothness of vertical tracking Diplopia Assessment: Other (comment) Additional Comments: further testing needed due to LOA     Perception Perception Perception: Impaired Inattention/Neglect: Does not attend to left visual field;Does not attend to left side  of body   Praxis Praxis Praxis: Not tested    Pertinent Vitals/Pain Pain Assessment: No/denies pain     Hand Dominance Right   Extremity/Trunk Assessment Upper Extremity Assessment Upper Extremity Assessment: LUE deficits/detail LUE Deficits / Details: minimal gross grasp, no other AROM noted to L UE LUE Sensation: WNL LUE Coordination: decreased fine motor;decreased gross motor   Lower Extremity Assessment Lower Extremity Assessment: Defer to PT evaluation   Cervical / Trunk Assessment Cervical / Trunk Assessment: Kyphotic   Communication Communication Communication: Other (comment) (lethargic)   Cognition Arousal/Alertness: Lethargic Behavior During Therapy: WFL for tasks assessed/performed Overall Cognitive Status: Difficult to assess                                 General Comments: Answered questions appropriately and was following commands.  Alert to place, situation and self.     General Comments       Exercises     Shoulder Instructions      Home Living Family/patient expects to be discharged to:: Private residence Living Arrangements: Spouse/significant other Available Help at Discharge: Family Type of Home: House Home Access: Stairs to enter Technical brewer of Steps: 1   Home Layout: Two level Alternate Level Stairs-Number of Steps: flight   Bathroom Shower/Tub: Occupational psychologist: Standard Bathroom Accessibility: Yes How Accessible: Accessible via walker     Additional Comments: information limited by patient's lethargy      Prior Functioning/Environment Prior Level of Function : Independent/Modified Independent                        OT Problem List: Decreased strength;Decreased range of motion;Decreased activity tolerance;Decreased coordination;Impaired vision/perception;Impaired balance (sitting and/or standing);Decreased safety awareness      OT Treatment/Interventions: Self-care/ADL  training;Therapeutic exercise;Balance training;Therapeutic activities;Cognitive remediation/compensation;DME and/or AE instruction    OT Goals(Current goals can be found in the care plan section) Acute Rehab OT Goals Patient Stated Goal: None stated OT Goal Formulation: Patient unable to participate in goal setting Time For Goal Achievement: 02/14/21 Potential to Achieve Goals: Fair ADL Goals Pt Will Perform Grooming: with set-up;bed level Pt Will Perform Upper Body Bathing: with min assist;sitting Pt Will Perform Upper Body Dressing: with min assist;sitting Pt/caregiver will Perform Home Exercise Program: Increased strength;Left upper extremity;With minimal assist;Increased ROM Additional ADL Goal #1: Patient to sit edge of bed with supervision for up to 5 min to increase independence with UB ADL and toilet transfers  OT Frequency: Min 2X/week   Barriers to D/C:    none noted       Co-evaluation PT/OT/SLP Co-Evaluation/Treatment: Yes Reason for Co-Treatment: Complexity of the patient's impairments (multi-system involvement);For patient/therapist safety;To address functional/ADL transfers   OT goals addressed during session: ADL's and self-care      AM-PAC OT "6 Clicks" Daily Activity     Outcome Measure Help from another person eating meals?: Total Help from another person taking care of personal grooming?: A Lot Help from another person toileting, which includes  using toliet, bedpan, or urinal?: Total Help from another person bathing (including washing, rinsing, drying)?: A Lot Help from another person to put on and taking off regular upper body clothing?: A Lot Help from another person to put on and taking off regular lower body clothing?: Total 6 Click Score: 9   End of Session Nurse Communication: Mobility status  Activity Tolerance: Patient limited by lethargy Patient left: in bed;with call bell/phone within reach;with bed alarm set  OT Visit Diagnosis: Unsteadiness on  feet (R26.81);Muscle weakness (generalized) (M62.81);Other symptoms and signs involving cognitive function;Hemiplegia and hemiparesis Hemiplegia - Right/Left: Left Hemiplegia - dominant/non-dominant: Non-Dominant                Time: 6389-3734 OT Time Calculation (min): 17 min Charges:  OT General Charges $OT Visit: 1 Visit OT Evaluation $OT Eval Moderate Complexity: 1 Mod  01/31/2021  RP, OTR/L  Acute Rehabilitation Services  Office:  [phone]   Metta Clines 01/31/2021, 12:23 PM

## 2021-01-31 NOTE — Progress Notes (Addendum)
Refusing Keppra as it makes him feel weird. Will switch to Vimpat 50mg  BID.  Revied 24 hours post tNKASE CTH and is stable. Started on Aspirin earlier, will do DVT prophylaxis too.  [place] Pager Number [phone]

## 2021-02-01 ENCOUNTER — Other Ambulatory Visit (HOSPITAL_COMMUNITY): Payer: Self-pay

## 2021-02-01 ENCOUNTER — Inpatient Hospital Stay (HOSPITAL_COMMUNITY): Payer: Medicare HMO

## 2021-02-01 LAB — CREATININE, SERUM
Creatinine, Ser: 1.01 mg/dL (ref 0.61–1.24)
GFR, Estimated: >60 mL/min (ref >60)

## 2021-02-01 LAB — CBC
HCT: 46 % (ref 39.0–52.0)
Hemoglobin: 15.7 g/dL (ref 13.0–17.0)
MCH: 29.8 pg (ref 26.0–34.0)
MCHC: 34.1 g/dL (ref 30.0–36.0)
MCV: 87.3 fL (ref 80.0–100.0)
Platelets: 224 10*3/uL (ref 150–400)
RBC: 5.27 MIL/uL (ref 4.22–5.81)
RDW: 13.4 % (ref 11.5–15.5)
WBC: 8.7 10*3/uL (ref 4.0–10.5)
nRBC: 0 % (ref 0.0–0.2)

## 2021-02-01 MED ORDER — LORAZEPAM 2 MG/ML IJ SOLN: 0.5000 mg | Freq: Once | INTRAMUSCULAR | Status: AC

## 2021-02-01 MED ORDER — LORAZEPAM 2 MG/ML IJ SOLN
INTRAMUSCULAR | Status: AC
  Administered 2021-02-01: 11:00:00 0.5 mg via INTRAVENOUS
  Filled 2021-02-01: qty 1

## 2021-02-01 NOTE — Progress Notes (Signed)
Physical Therapy Treatment Patient Details Name: Douglas Spencer MRN: [id number] DOB: Nov 01, 1945 Today's Date: 02/01/2021   History of Present Illness 75 y.o. male who presented to the ED on 12/20 secondary to right-sided gaze, left-sided weakness and drift, slurred speech. TNK given. 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 EEG 12/21 was WNL. 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    PT Comments    Patient progressing OOB to recliner this date. Patient requires modA+2 for step pivot transfer to recliner on L side. Patient continues to demonstrate L inattention and difficulty crossing midline due to R gaze preference. Patient with poor awareness into deficits and stating "I want to get out of here." Patient agitated at current situation and at times not answering questions appropriately. When asked where he was, patient stated "[place]". Continue to recommend CIR level therapies to assist with maximizing functional mobility and safety.    Recommendations for follow up therapy are one component of a multi-disciplinary discharge planning process, led by the attending physician.  Recommendations may be updated based on patient status, additional functional criteria and insurance authorization.  Follow Up Recommendations  Acute inpatient rehab (3hours/day)     Assistance Recommended at Discharge Frequent or constant Supervision/Assistance  Equipment Recommendations  Other (comment) (TBD)    Recommendations for Other Services Rehab consult     Precautions / Restrictions Precautions Precautions: Fall Restrictions Weight Bearing Restrictions: No     Mobility  Bed Mobility Overal bed mobility: Needs Assistance Bed Mobility: Supine to Sit   Sidelying to sit: Mod assist Supine to sit: Mod assist      General bed mobility comments: instructed patient to use R LE to assist L LE off bed. ModA for bringing LEs off bed and trunk elevation.    Transfers Overall transfer level: Needs assistance Equipment used: 2 person hand held assist Transfers: Sit to/from Stand;Bed to chair/wheelchair/BSC Sit to Stand: Min assist;+2 physical assistance     Step pivot transfers: Mod assist;+2 physical assistance     General transfer comment: modA+2 for step pivot transfer. Blocking L LE due to buckling. Requires assist for advancing L LE towards reclienr    Ambulation/Gait                   Stairs             Wheelchair Mobility    Modified Rankin (Stroke Patients Only) Modified Rankin (Stroke Patients Only) Pre-Morbid Rankin Score: No symptoms Modified Rankin: Severe disability     Balance Overall balance assessment: Needs assistance Sitting-balance support: Single extremity supported;Feet supported Sitting balance-Leahy Scale: Poor Sitting balance - Comments: reuqires minA with hands in lap but modA to maintain with R UE support due to mild pushing Postural control: Left lateral lean Standing balance support: Bilateral upper extremity supported Standing balance-Leahy Scale: Poor                              Cognition Arousal/Alertness: Awake/alert Behavior During Therapy: Flat affect Overall Cognitive Status: Impaired/Different from baseline Area of Impairment: Orientation;Memory;Following commands;Safety/judgement;Attention;Problem solving;Awareness                 Orientation Level: Disoriented to;Situation Current Attention Level: Sustained Memory: Decreased short-term memory;Decreased recall of precautions Following Commands: Follows one step commands inconsistently Safety/Judgement:  Decreased awareness of safety;Decreased awareness of deficits Awareness: Intellectual Problem Solving: Slow processing;Decreased initiation General Comments:  poor awareness into situation. With increased time, able to state he had a stroke but unaware of effects of stroke. Functional Status Assessment: Patient has had a recent decline in their functional status and demonstrates the ability to make significant improvements in function in a reasonable and predictable amount of time.      Exercises      General Comments        Pertinent Vitals/Pain Pain Assessment: No/denies pain    Home Living     Available Help at Discharge: Family Type of Home: House                  Prior Function            PT Goals (current goals can now be found in the care plan section) Acute Rehab PT Goals Patient Stated Goal: did not state PT Goal Formulation: With patient Time For Goal Achievement: 02/14/21 Potential to Achieve Goals: Good Progress towards PT goals: Progressing toward goals    Frequency    Min 4X/week      PT Plan Current plan remains appropriate    Co-evaluation PT/OT/SLP Co-Evaluation/Treatment: Yes Reason for Co-Treatment: For patient/therapist safety;To address functional/ADL transfers PT goals addressed during session: Mobility/safety with mobility;Balance OT goals addressed during session: ADL's and self-care      AM-PAC PT "6 Clicks" Mobility   Outcome Measure  Help needed turning from your back to your side while in a flat bed without using bedrails?: A Lot Help needed moving from lying on your back to sitting on the side of a flat bed without using bedrails?: A Lot Help needed moving to and from a bed to a chair (including a wheelchair)?: Total Help needed standing up from a chair using your arms (e.g., wheelchair or bedside chair)?: Total Help needed to walk in hospital room?: Total Help needed climbing 3-5 steps with a railing? : Total 6 Click Score: 8    End of Session Equipment Utilized During Treatment: Gait belt Activity Tolerance: Patient tolerated treatment well Patient left: in chair;with  call bell/phone within reach;with chair alarm set Nurse Communication: Mobility status PT Visit Diagnosis: Unsteadiness on feet (R26.81);Muscle weakness (generalized) (M62.81);Difficulty in walking, not elsewhere classified (R26.2)     Time: 4696-2952 PT Time Calculation (min) (ACUTE ONLY): 32 min  Charges:  $Therapeutic Activity: 8-22 mins                     Verlena Marlette A. Gilford Rile PT, DPT Acute Rehabilitation Services Pager [phone] Office [phone]    Linna Hoff 02/01/2021, 5:25 PM

## 2021-02-01 NOTE — Progress Notes (Signed)
STROKE TEAM PROGRESS NOTE      INTERVAL HISTORY No family is at the bedside. Patient is currently connected to an EEG but reports shows no seizure activity hence we will discontinue this today and the plan for an MRI today. Patient is more alert and interactive today.  He was on Keppra for seizures but refuses to take it stating he does not like it and would like alternative anticonvulsants since he has been switched to Vimpat.  2D echo is unremarkable. Vitals:   02/01/21 0500 02/01/21 0600 02/01/21 0700 02/01/21 0800  BP: (!) 175/86 (!) 142/75 138/83   Pulse: (!) 54 64 61   Resp: 12 15 12    Temp:    (!) 97.4 F (36.3 C)  TempSrc:    Oral  SpO2: 100% 96% 96%   Weight:      Height:       CBC:  Recent Labs  Lab 01/30/21 2144 01/30/21 2148 02/01/21 0057  WBC 8.9  --  8.7  NEUTROABS 4.8  --   --   HGB 17.3* 16.7 15.7  HCT 51.1 49.0 46.0  MCV 88.4  --  87.3  PLT 248  --  762    Basic Metabolic Panel:  Recent Labs  Lab 01/30/21 2144 01/30/21 2148 02/01/21 0057  NA 133* 134*  --   K 3.8 4.4  --   CL 103 106  --   CO2 21*  --   --   GLUCOSE 87 87  --   BUN 10 11  --   CREATININE 1.01 1.00 1.01  CALCIUM 9.2  --   --     Lipid Panel:  Recent Labs  Lab 01/31/21 0105  CHOL 152  TRIG 61  HDL 36*  CHOLHDL 4.2  VLDL 12  LDLCALC 104*    HgbA1c:  Recent Labs  Lab 01/31/21 0105  HGBA1C 5.3    Urine Drug Screen:  Recent Labs  Lab 01/31/21 0154  LABOPIA NONE DETECTED  COCAINSCRNUR POSITIVE*  LABBENZ NONE DETECTED  AMPHETMU NONE DETECTED  THCU POSITIVE*  LABBARB NONE DETECTED     Alcohol Level No results for input(s): ETH in the last 168 hours.  IMAGING past 24 hours CT HEAD WO CONTRAST (5MM)  Result Date: 01/31/2021 CLINICAL DATA:  Stroke, follow up post tNKase, evaluate for ICH EXAM: CT HEAD WITHOUT CONTRAST TECHNIQUE: Contiguous axial images were obtained from the base of the skull through the vertex without intravenous contrast. COMPARISON:   01/31/2021, 01/30/2021 FINDINGS: Brain: Normal anatomic configuration. Parenchymal volume loss is commensurate with the patient's age. Mild periventricular white matter changes are present likely reflecting the sequela of small vessel ischemia. Subacute infarct involving the right lentiform nucleus and caudate body is again. Remote left cerebellar infarct again noted. No abnormal intra or extra-axial mass lesion or fluid collection. No abnormal mass effect or midline shift. No evidence of acute intracranial hemorrhage. Ventricular size is normal. Cerebellum unremarkable. Vascular: Stable hyperdensity of the a right MCA. Skull: Intact Sinuses/Orbits: Mild mucosal thickening within the frontal sinuses again noted. Remaining paranasal sinuses are clear. Orbits are unremarkable. Other: Mastoid air cells and middle ear cavities are clear. IMPRESSION: Involving infarct within the right basal ganglia again identified. Stable hyperdensity within the right MCA. No superimposed acute intracranial hemorrhage. Electronically Signed   By: Fidela Salisbury M.D.   On: 01/31/2021 22:58   ECHOCARDIOGRAM COMPLETE  Result Date: 01/31/2021    ECHOCARDIOGRAM REPORT   Patient Name:   Douglas Spencer Date of  Exam: 01/31/2021 Medical Rec #:  [id number]      Height:       68.0 in Accession #:    [id number]     Weight:       131.4 lb Date of Birth:  03-07-1945     BSA:          1.710 m Patient Age:    75 years       BP:           153/92 mmHg Patient Gender: M              HR:           63 bpm. Exam Location:  Inpatient Procedure: 2D Echo, Cardiac Doppler and Color Doppler Indications:    Stroke  History:        Patient has no prior history of Echocardiogram examinations.  Sonographer:    Merrie Roof RDCS Referring Phys: [id number] [place]  1. Left ventricular ejection fraction, by estimation, is 60 to 65%. The left ventricle has normal function. The left ventricle has no regional wall motion abnormalities. Left  ventricular diastolic parameters are consistent with Grade I diastolic dysfunction (impaired relaxation).  2. Right ventricular systolic function is normal. The right ventricular size is normal.  3. The mitral valve is myxomatous. Mild mitral valve regurgitation. No evidence of mitral stenosis.  4. The aortic valve is tricuspid. There is moderate calcification of the aortic valve. There is moderate thickening of the aortic valve. Aortic valve regurgitation is not visualized. Aortic valve sclerosis/calcification is present, without any evidence of aortic stenosis.  5. The inferior vena cava is normal in size with greater than 50% respiratory variability, suggesting right atrial pressure of 3 mmHg. FINDINGS  Left Ventricle: Left ventricular ejection fraction, by estimation, is 60 to 65%. The left ventricle has normal function. The left ventricle has no regional wall motion abnormalities. The left ventricular internal cavity size was normal in size. There is  no left ventricular hypertrophy. Left ventricular diastolic parameters are consistent with Grade I diastolic dysfunction (impaired relaxation). Right Ventricle: The right ventricular size is normal. No increase in right ventricular wall thickness. Right ventricular systolic function is normal. Left Atrium: Left atrial size was normal in size. Right Atrium: Right atrial size was normal in size. Prominent Eustachian valve. Pericardium: There is no evidence of pericardial effusion. 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 There is mild thickening of the mitral valve leaflet(s). Mild mitral annular calcification. Mild mitral valve regurgitation, with centrally-directed jet. No evidence of mitral valve stenosis. Tricuspid Valve: The tricuspid valve is normal in structure. Tricuspid valve regurgitation is trivial. No evidence of tricuspid stenosis. Aortic Valve: The aortic valve is tricuspid. There is moderate calcification of the aortic valve. There is moderate  thickening of the aortic valve. Aortic valve regurgitation is not visualized. Aortic valve sclerosis/calcification is present, without any  evidence of aortic stenosis. Aortic valve mean gradient measures 3.0 mmHg. Aortic valve peak gradient measures 5.1 mmHg. Aortic valve area, by VTI measures 2.21 cm. Pulmonic Valve: The pulmonic valve was normal in structure. Pulmonic valve regurgitation is not visualized. No evidence of pulmonic stenosis. Aorta: The aortic root is normal in size and structure. Venous: The inferior vena cava is normal in size with greater than 50% respiratory variability, suggesting right atrial pressure of 3 mmHg. IAS/Shunts: No atrial level shunt detected by color flow Doppler.  LEFT VENTRICLE PLAX 2D LVIDd:         3.70  cm   Diastology LVIDs:         2.50 cm   LV e' medial:    5.77 cm/s LV PW:         0.90 cm   LV E/e' medial:  12.6 LV IVS:        0.60 cm   LV e' lateral:   6.09 cm/s LVOT diam:     1.90 cm   LV E/e' lateral: 12.0 LV SV:         58 LV SV Index:   34 LVOT Area:     2.84 cm  RIGHT VENTRICLE RV Basal diam:  3.90 cm LEFT ATRIUM             Index        RIGHT ATRIUM           Index LA diam:        2.50 cm 1.46 cm/m   RA Area:     10.90 cm LA Vol (A2C):   36.8 ml 21.53 ml/m  RA Volume:   24.20 ml  14.16 ml/m LA Vol (A4C):   19.8 ml 11.58 ml/m LA Biplane Vol: 28.6 ml 16.73 ml/m  AORTIC VALVE AV Area (Vmax):    2.50 cm AV Area (Vmean):   2.45 cm AV Area (VTI):     2.21 cm AV Vmax:           113.00 cm/s AV Vmean:          76.800 cm/s AV VTI:            0.263 m AV Peak Grad:      5.1 mmHg AV Mean Grad:      3.0 mmHg LVOT Vmax:         99.80 cm/s LVOT Vmean:        66.400 cm/s LVOT VTI:          0.205 m LVOT/AV VTI ratio: 0.78  AORTA Ao Root diam: 2.90 cm MITRAL VALVE MV Area (PHT): 2.76 cm    SHUNTS MV Decel Time: 275 msec    Systemic VTI:  0.20 m MV E velocity: 72.80 cm/s  Systemic Diam: 1.90 cm MV A velocity: 88.70 cm/s MV E/A ratio:  0.82 Mihai Croitoru MD Electronically  signed by Sanda Klein MD Signature Date/Time: 01/31/2021/6:37:43 PM    Final     PHYSICAL EXAM  Temp:  [97.4 F (36.3 C)-98.6 F (37 C)] 97.4 F (36.3 C) (12/22 0800) Pulse Rate:  [52-88] 61 (12/22 0700) Resp:  [12-22] 12 (12/22 0700) BP: (138-175)/(75-99) 138/83 (12/22 0700) SpO2:  [91 %-100 %] 96 % (12/22 0700)  Physical Exam Pleasant elderly Caucasian male not in distress. . Afebrile. Head is nontraumatic. Neck is supple without bruit.    Cardiac exam no murmur or gallop. Lungs are clear to auscultation. Distal pulses are well felt. Neurological Exam :  Patient is drowsy but can be easily aroused.  He has right gaze preference and look to the left past midline but not all the way.  Blinks to threat on the right but not on the left.  Moderate left facial weakness.  Moderate dysarthria.  Tongue midline.  Left hemiplegia with only grade 2/5 strength in the left upper and 2-3/5 strength in the left lower extremity.  Tone is diminished on the left.  Sensation is diminished in the left compared to the right.  Right plantar downgoing left upgoing.  Gait not tested ASSESSMENT/PLAN Douglas Spencer is a 75 y.o.  male with history of ETOH with seizure, non compliant with keppra, polysubstance use, and smoking presenting with left side weakness. He was found on the floor by his friend unable to move his left side. LKW 2045 on 01/30/2021. On arrival to the ED he was awake, alert, and talkative and moving his right side spontaneously. He was plegic in this left upper and lower extremity with gaze deviation to the right. TNK was given.  He became more lethargic over night on 12/21 at around 0500 and was taken for a repeat head CT and had an EEG ordered. MRI ordered for today.   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  CTA head & neck - negative for LVO MRI  Pending 2D Echo ejection fraction 60 to 65%.  No wall  motion abnormality. LDL 104 HgbA1c 5.3 VTE prophylaxis - SCDs  NPO except meds crushed in pureed No antithrombotic prior to admission, now on ASA suppository 300mg .  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 Patient advised to stop using due to stroke risk. Previous overdose in 2019 Obesity, Body mass index is 19.98 kg/m., BMI >/= 30 associated with increased stroke risk, recommend weight loss, diet and exercise as appropriate  Hx stroke/TIA Previous left basal ganglia infarct Unknown date Coronary artery disease Migraines Congestive heart failure  Other Active Problems  Hospital day # 2  Continue mobilization out of bed.  Discontinue long-term EEG and check MRI scan of the brain.  Speech therapy for swallow eval.  May need core track tube feeding if fails swallow eval.  Continue cardiac monitoring and may need loop recorder at discharge given large size of the basal ganglia infarct..  Patient counseled to quit cocaine and marijuana and seems agreeable.  No family available at the bedside for discussion.  Transfer to neurology floor bed when available. This patient is critically ill and at significant risk of neurological worsening, death and care requires constant monitoring of vital signs, hemodynamics,respiratory and cardiac monitoring, extensive review of multiple  databases, frequent neurological assessment, discussion with family, other specialists and medical decision making of high complexity.I have made any additions or clarifications directly to the above note.This critical care time does not reflect procedure time, or teaching time or supervisory time of PA/NP/Med Resident etc but could involve care discussion time.  I spent 30 minutes of neurocritical care time  in the care of  this patient.      Antony Contras, MD Medical Director [facility] Pager: [phone] 02/01/2021 9:44 AM   To contact Stroke Continuity provider, please refer to [email or web address] After hours, contact General Neurology

## 2021-02-01 NOTE — Progress Notes (Signed)
Occupational Therapy Treatment Patient Details Name: Douglas Spencer MRN: [id number] DOB: 1945/08/30 Today's Date: 02/01/2021   History of present illness 75 y.o. male who presented to the ED on 12/20 secondary to right-sided gaze, left-sided weakness and drift, slurred speech. TNK given. 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 EEG 12/21 was WNL. 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  MRI: Acute infarct right lenticulostriate territory. Moderately large territory involved.   OT comments  Patient with incremental progress toward patient focused goals.  Patient still lethargic, but MRI was completed.  Patient able to progress out of bed this date to the recliner, +2 mod A needed for mobility.  Patient able to sit edge of bed with balance assist and cues to look Left to find wash cloth for grooming.  Able to use R upper extremity to wash his face.  Difficult to keep patient awake, patient understanding he has had a CVA, but does not recognize or appreciate any deficits. Deficits continue, and are listed below.  OT to continue in the acute setting, and AIR is recommended for post acute rehab prior to returning home.        Recommendations for follow up therapy are one component of a multi-disciplinary discharge planning process, led by the attending physician.  Recommendations may be updated based on patient status, additional functional criteria and insurance authorization.    Follow Up Recommendations  Acute inpatient rehab (3hours/day)    Assistance Recommended at Discharge Frequent or constant Supervision/Assistance  Equipment Recommendations  BSC/3in1;Wheelchair (measurements OT);Wheelchair cushion (measurements OT);Tub/shower seat    Recommendations for Other Services      Precautions / Restrictions  Precautions Precautions: Fall Restrictions Weight Bearing Restrictions: No       Mobility Bed Mobility Overal bed mobility: Needs Assistance Bed Mobility: Sidelying to Sit   Sidelying to sit: Mod assist            Transfers Overall transfer level: Needs assistance   Transfers: Sit to/from Stand;Bed to chair/wheelchair/BSC Sit to Stand: Min assist;+2 physical assistance   Step pivot transfers: Mod assist;+2 physical assistance             Balance Overall balance assessment: Needs assistance Sitting-balance support: Single extremity supported;Feet supported Sitting balance-Leahy Scale: Poor     Standing balance support: Bilateral upper extremity supported Standing balance-Leahy Scale: Poor                             ADL either performed or assessed with clinical judgement   ADL       Grooming: Bed level;Minimal assistance           Upper Body Dressing : Maximal assistance;Sitting   Lower Body Dressing: Total assistance;Bed level                      Extremity/Trunk Assessment Upper Extremity Assessment Upper Extremity Assessment: LUE deficits/detail LUE Deficits / Details: minimal gross grasp, no other AROM noted to L UE LUE Sensation: WNL LUE Coordination: decreased fine motor;decreased gross motor   Lower Extremity Assessment Lower Extremity Assessment: Defer to PT evaluation   Cervical / Trunk Assessment Cervical / Trunk Assessment: Kyphotic    Vision   Alignment/Gaze Preference: Gaze right Tracking/Visual Pursuits: Requires cues, head turns, or add eye shifts to track;Unable to  hold eye position out of midline;Decreased smoothness of vertical tracking   Perception Perception Perception: Impaired Inattention/Neglect: Does not attend to left visual field;Does not attend to left side of body   Praxis      Cognition Arousal/Alertness: Awake/alert Behavior During Therapy: Flat affect Overall Cognitive Status:  Impaired/Different from baseline Area of Impairment: Orientation;Memory;Following commands;Safety/judgement;Attention;Problem solving                 Orientation Level: Disoriented to;Place;Time;Situation Current Attention Level: Focused   Following Commands: Follows one step commands inconsistently Safety/Judgement: Decreased awareness of safety;Decreased awareness of deficits   Problem Solving: Slow processing;Decreased initiation            Exercises     Shoulder Instructions       General Comments      Pertinent Vitals/ Pain       Pain Assessment: No/denies pain  Home Living                                          Prior Functioning/Environment              Frequency  Min 2X/week        Progress Toward Goals  OT Goals(current goals can now be found in the care plan section)  Progress towards OT goals: Progressing toward goals  Acute Rehab OT Goals Patient Stated Goal: Go home OT Goal Formulation: Patient unable to participate in goal setting Time For Goal Achievement: 02/14/21 Potential to Achieve Goals: [place] Discharge plan remains appropriate    Co-evaluation    PT/OT/SLP Co-Evaluation/Treatment: Yes Reason for Co-Treatment: Complexity of the patient's impairments (multi-system involvement);Necessary to address cognition/behavior during functional activity;For patient/therapist safety;To address functional/ADL transfers   OT goals addressed during session: ADL's and self-care      AM-PAC OT "6 Clicks" Daily Activity     Outcome Measure   Help from another person eating meals?: Total Help from another person taking care of personal grooming?: A Lot Help from another person toileting, which includes using toliet, bedpan, or urinal?: Total Help from another person bathing (including washing, rinsing, drying)?: A Lot Help from another person to put on and taking off regular upper body clothing?: A Lot Help from  another person to put on and taking off regular lower body clothing?: Total 6 Click Score: 9    End of Session Equipment Utilized During Treatment: Gait belt  OT Visit Diagnosis: Unsteadiness on feet (R26.81);Muscle weakness (generalized) (M62.81);Other symptoms and signs involving cognitive function;Hemiplegia and hemiparesis Hemiplegia - Right/Left: Left Hemiplegia - dominant/non-dominant: Non-Dominant   Activity Tolerance Patient limited by lethargy   Patient Left in chair;with call bell/phone within reach;with chair alarm set   Nurse Communication Mobility status        Time: 5809-9833 OT Time Calculation (min): 26 min  Charges: OT General Charges $OT Visit: 1 Visit OT Treatments $Self Care/Home Management : 8-22 mins  02/01/2021  RP, OTR/L  Acute Rehabilitation Services  Office:  [phone]   Metta Clines 02/01/2021, 3:58 PM

## 2021-02-01 NOTE — Progress Notes (Signed)
Speech Language Pathology Treatment: Dysphagia  Patient Details Name: Douglas Spencer MRN: [id number] DOB: 12-09-1945 Today's Date: 02/01/2021 Time: 5916-3846 SLP Time Calculation (min) (ACUTE ONLY): 22 min  Assessment / Plan / Recommendation Clinical Impression  Pt was seen for dysphagia treatment. The session was conducted with PT and OT who facilitated optimal positioning and alertness. Pt's level of alertness was improved compared to yesterday, but pt was still lethargic and his nurse reported that the pt received Ativan for the MRI this morning. Pt demonstrated reduced secretion management as evidence by a wet vocal quality at baseline which was improved with prompted coughing and oral suctioning. Pt demonstrated oral holding and an oral and/or pharyngeal delay of up to 30 seconds was noted. Significant coughing was demonstrated with purees, ice chips, thin liquids via spoon, and honey thick liquids via spoon, suggesting aspiration. It is recommended that pt's NPO status be maintained. In the absence of significant improvement overnight, SLP anticipates that enteral nutrition (e.g., Cortrak) will be needed tomorrow, but SLP will continue to follow pt and assess improvement.   HPI HPI: Pt is a 75 y.o. male who presented to the ED secondary to right-sided gaze, left-sided weakness and drift, slurred speech. TNK given. 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 EEG 12/21,12/22 were WNL. 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      SLP Plan  Continue with current plan of care      Recommendations for follow up therapy are one component of a multi-disciplinary discharge planning process, led by the attending physician.  Recommendations may be updated based on patient status, additional functional criteria and insurance  authorization.    Recommendations  Diet recommendations: NPO Medication Administration: Via alternative means                Oral Care Recommendations: Oral care BID Follow Up Recommendations: Acute inpatient rehab (3hours/day) Assistance recommended at discharge: Frequent or constant Supervision/Assistance SLP Visit Diagnosis: Dysphagia, unspecified (R13.10) Plan: Continue with current plan of care         Douglas Spencer I. Hardin Negus, [place], [place] Office number [phone] Pager [place]  02/01/2021, 4:13 PM

## 2021-02-01 NOTE — Progress Notes (Signed)
SLP Cancellation Note  Patient Details Name: [name] MRN: [id number] DOB: [birth year]   Cancelled treatment:       Reason Eval/Treat Not Completed: Patient at procedure or test/unavailable (Pt with transport leaving the unit for MRI. SLP will follow up.)  Adarrius Graeff I. Hardin Negus, [place], [place] Office number [phone] Pager [phone]  Horton Marshall 02/01/2021, 10:53 AM

## 2021-02-01 NOTE — Evaluation (Signed)
Speech Language Pathology Evaluation Patient Details Name: Douglas Spencer MRN: [id number] DOB: March 06, 1945 Today's Date: 02/01/2021 Time: 1531-1550 SLP Time Calculation (min) (ACUTE ONLY): 19 min  Problem List:  Patient Active Problem List   Diagnosis Date Noted   Stroke determined by clinical assessment ([place]) 01/30/2021   Past Medical History: No past medical history on file. Past Surgical History: No past surgical history on file. HPI:  Pt is a 75 y.o. male who presented to the ED secondary to right-sided gaze, left-sided weakness and drift, slurred speech. TNK given. 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 EEG 12/21,12/22 were WNL. MRI brain 12/22: Acute infarct right lenticulostriate territory. Moderately large  territory involved. 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   Assessment / Plan / Recommendation Clinical Impression  Pt participated in speech/language/cognition evaluation. 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 He stated that he has a bachelor's degree in latin american studies, and started a two master's degrees (Spanish and an MBA), but did not complete either. He denied any baseline deficits or acute changes in speech, language, or cognition. The [facility] Mental Status Examination was completed to evaluate the pt's cognitive-linguistic skills. He achieved a score of 9/30 which is below the normal limits of 27 or more out of 30. He exhibited deficits in the areas of awareness, attention, memory, temporal orientation, complex problem solving, and executive function. He also presented with moderate-severe dysarthria characterized by reduced articulatory precision and reduced vocal intensity which negatively impacted speech intelligibility across all levels. Skilled SLP  services are clinically indicated at this time to improve motor speech and cognitive-linguistic function.    SLP Assessment  SLP Recommendation/Assessment: Patient needs continued Speech Lanaguage Pathology Services SLP Visit Diagnosis: Cognitive communication deficit (R41.841);Dysarthria and anarthria (R47.1)    Recommendations for follow up therapy are one component of a multi-disciplinary discharge planning process, led by the attending physician.  Recommendations may be updated based on patient status, additional functional criteria and insurance authorization.    Follow Up Recommendations  Acute inpatient rehab (3hours/day)    Assistance Recommended at Discharge  Frequent or constant Supervision/Assistance  Functional Status Assessment Patient has had a recent decline in their functional status and demonstrates the ability to make significant improvements in function in a reasonable and predictable amount of time.  Frequency and Duration min 2x/week  2 weeks      SLP Evaluation Cognition  Overall Cognitive Status: Impaired/Different from baseline Arousal/Alertness: Lethargic Orientation Level: Oriented to person;Oriented to place;Oriented to situation;Disoriented to time Year: 2021 Month: December Day of Week: Correct Attention: Focused;Sustained Focused Attention: Impaired Focused Attention Impairment: Verbal complex Sustained Attention: Impaired Sustained Attention Impairment: Verbal complex Memory: Impaired Memory Impairment: Retrieval deficit;Decreased recall of new information (Immediate: 5/5 with repetition; delayed: 1/5; with cues: 1/4) Awareness: Impaired Awareness Impairment: Intellectual impairment Problem Solving: Impaired Problem Solving Impairment: Verbal complex Executive Function: Sequencing;Organizing Sequencing: Impaired Sequencing Impairment: Verbal complex (Clock drawing: 0/4) Organizing: Impaired Organizing Impairment: Verbal complex (backward digit  span: 0/2)       Comprehension  Auditory Comprehension Yes/No Questions: Within Functional Limits Commands: Impaired One Step Basic Commands: 75-100% accurate Two Step Basic Commands: 50-74% accurate Conversation: Simple Interfering Components: Attention;Working memory;Processing speed    Expression Expression Primary Mode of Expression: Verbal Verbal Expression Initiation: No impairment Level of Generative/Spontaneous Verbalization: Conversation Naming: No  impairment Interfering Components: Attention   Oral / Motor  Oral Motor/Sensory Function Overall Oral Motor/Sensory Function: Moderate impairment Facial ROM: Reduced left;Suspected CN VII (facial) dysfunction Facial Symmetry: Abnormal symmetry left;Suspected CN VII (facial) dysfunction Facial Strength: Reduced left;Suspected CN VII (facial) dysfunction Lingual ROM: Reduced right;Reduced left Motor Speech Overall Motor Speech: Impaired Respiration: Impaired Level of Impairment: Sentence Phonation: Low vocal intensity Articulation: Impaired Level of Impairment: Conversation Intelligibility: Intelligibility reduced Word: 50-74% accurate Phrase: 50-74% accurate Sentence: 25-49% accurate Conversation: 25-49% accurate Motor Planning: Witnin functional limits Motor Speech Errors: Aware;Consistent           Galaxy Borden I. Hardin Negus, [place], [place] Office number [phone] Pager [place] 02/01/2021, 4:31 PM

## 2021-02-01 NOTE — Progress Notes (Signed)
LTM maint complete - no skin breakdown under:  Fp1 FF2 A2 [facility] monitored, Event button test confirmed by [facility].

## 2021-02-01 NOTE — Progress Notes (Signed)
EEG maintenance performed.  No skin breakdown observed at electrode positions P8, F8, Fp1.

## 2021-02-01 NOTE — Procedures (Addendum)
Patient Name: Douglas Spencer  MRN: [id number]  Epilepsy Attending: Lora Havens  Referring Physician/Provider: Dr Donnetta Simpers Duration: 02/01/2021 0149 to 02/01/2021 0957   Patient history: 75 y.o. 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 EEG to evaluate for seizure   Level of alertness: Awake, asleep   AEDs during EEG study: LCM   Technical aspects: This EEG study was done with scalp electrodes positioned according to the 10-20 International system of electrode placement. Electrical activity was acquired at a sampling rate of 500Hz  and reviewed with a high frequency filter of 70Hz  and a low frequency filter of 1Hz . EEG data were recorded continuously and digitally stored.    Description: The posterior dominant rhythm consists of 9-10 Hz activity of moderate voltage (25-35 uV) seen predominantly in posterior head regions, symmetric and reactive to eye opening and eye closing. Hyperventilation and photic stimulation were not performed.      IMPRESSION: This study is within normal limits. No seizures or epileptiform discharges were seen throughout the recording.   Joury Allcorn [name]

## 2021-02-01 NOTE — TOC Benefit Eligibility Note (Signed)
Patient Teacher, English as a foreign language completed.    The patient is currently admitted and upon discharge could be taking lacosamide (Vimpat) 50 mg.  The current 30 day co-pay is, $0.00.   The patient is insured through [place], [place] Patient Advocate Specialist [place] Patient Advocate Team Direct Number: [phone]  Fax: [phone]

## 2021-02-01 NOTE — Progress Notes (Addendum)
Discontinued cEEG study.  Skin breakdown observed at electrode site Fp2. Notified [facility] monitoring.

## 2021-02-02 ENCOUNTER — Inpatient Hospital Stay (HOSPITAL_COMMUNITY): Payer: Medicare HMO

## 2021-02-02 DIAGNOSIS — I639 Cerebral infarction, unspecified: Secondary | ICD-10-CM | POA: Diagnosis not present

## 2021-02-02 LAB — BASIC METABOLIC PANEL
Anion gap: 13 (ref 5–15)
BUN: 13 mg/dL (ref 8–23)
CO2: 17 mmol/L — ABNORMAL LOW (ref 22–32)
Calcium: 8.6 mg/dL — ABNORMAL LOW (ref 8.9–10.3)
Chloride: 109 mmol/L (ref 98–111)
Creatinine, Ser: 1.02 mg/dL (ref 0.61–1.24)
GFR, Estimated: >60 mL/min (ref >60)
Glucose, Bld: 72 mg/dL (ref 70–99)
Potassium: 4 mmol/L (ref 3.5–5.1)
Sodium: 139 mmol/L (ref 135–145)

## 2021-02-02 LAB — MAGNESIUM: Magnesium: 1.9 mg/dL (ref 1.7–2.4)

## 2021-02-02 LAB — CBC
HCT: 45.8 % (ref 39.0–52.0)
Hemoglobin: 15.6 g/dL (ref 13.0–17.0)
MCH: 29.6 pg (ref 26.0–34.0)
MCHC: 34.1 g/dL (ref 30.0–36.0)
MCV: 86.9 fL (ref 80.0–100.0)
Platelets: 221 10*3/uL (ref 150–400)
RBC: 5.27 MIL/uL (ref 4.22–5.81)
RDW: 13.2 % (ref 11.5–15.5)
WBC: 7.4 10*3/uL (ref 4.0–10.5)
nRBC: 0 % (ref 0.0–0.2)

## 2021-02-02 LAB — PHOSPHORUS: Phosphorus: 2.5 mg/dL (ref 2.5–4.6)

## 2021-02-02 LAB — GLUCOSE, CAPILLARY
Glucose-Capillary: 101 mg/dL — ABNORMAL HIGH (ref 70–99)
Glucose-Capillary: 93 mg/dL (ref 70–99)
Glucose-Capillary: 90 mg/dL (ref 70–99)

## 2021-02-02 MED ORDER — OSMOLITE 1.5 CAL PO LIQD
1000.0000 mL | ORAL | Status: DC
  Administered 2021-02-02 – 2021-02-05 (×3): 1000 mL

## 2021-02-02 MED ORDER — ASPIRIN 325 MG PO TABS
325.0000 mg | ORAL_TABLET | Freq: Every day | ORAL | Status: DC
  Administered 2021-02-02 – 2021-02-05 (×4): 325 mg
  Filled 2021-02-02 (×4): qty 1

## 2021-02-02 MED ORDER — ATORVASTATIN CALCIUM 80 MG PO TABS
80.0000 mg | ORAL_TABLET | Freq: Every day | ORAL | Status: DC
  Administered 2021-02-02 – 2021-02-05 (×4): 80 mg
  Filled 2021-02-02 (×4): qty 1

## 2021-02-02 MED ORDER — ORAL CARE MOUTH RINSE
15.0000 mL | Freq: Two times a day (BID) | OROMUCOSAL | Status: DC
  Administered 2021-02-02 – 2021-02-06 (×9): 15 mL via OROMUCOSAL

## 2021-02-02 MED ORDER — CHLORHEXIDINE GLUCONATE 0.12 % MT SOLN
15.0000 mL | Freq: Two times a day (BID) | OROMUCOSAL | Status: DC
  Administered 2021-02-02 – 2021-02-06 (×8): 15 mL via OROMUCOSAL
  Filled 2021-02-02 (×8): qty 15

## 2021-02-02 MED ORDER — SENNOSIDES-DOCUSATE SODIUM 8.6-50 MG PO TABS
1.0000 | ORAL_TABLET | Freq: Every evening | ORAL | Status: DC | PRN
  Administered 2021-02-06: 11:00:00 1
  Filled 2021-02-02: qty 1

## 2021-02-02 MED ORDER — PANTOPRAZOLE 2 MG/ML SUSPENSION
40.0000 mg | Freq: Every day | ORAL | Status: DC
  Administered 2021-02-02 – 2021-02-06 (×5): 40 mg
  Filled 2021-02-02 (×5): qty 20

## 2021-02-02 MED ORDER — PROSOURCE TF PO LIQD
45.0000 mL | Freq: Every day | ORAL | Status: DC
  Administered 2021-02-02 – 2021-02-06 (×5): 45 mL
  Filled 2021-02-02 (×5): qty 45

## 2021-02-02 NOTE — Progress Notes (Signed)
Initial Nutrition Assessment  DOCUMENTATION CODES:   Severe malnutrition in context of social or environmental circumstances  INTERVENTION:   Initiate tube feeds via Cortrak tube: - Start Osmolite 1.5 @ 20 ml/hr and advance by 10 ml q 8 hours to goal rate of 55 ml/hr (1320 ml/day) - ProSource TF 45 ml daily  Tube feeding regimen at goal provides 2020 kcal, 94 grams of protein, and 1006 ml of H2O.  - Once IVF are discontinued, recommend adding free water flushes of 150 ml q 4 hours for a total of 1906 ml free water daily  Monitor magnesium, potassium, and phosphorus BID for at least 3 days, MD to replete as needed, as pt is at risk for refeeding syndrome given severe malnutrition, hx polysubstance abuse.  NUTRITION DIAGNOSIS:   Severe Malnutrition related to social / environmental circumstances (polysubstance abuse) as evidenced by severe fat depletion, severe muscle depletion.  GOAL:   Patient will meet greater than or equal to 90% of their needs  MONITOR:   Diet advancement, Labs, Weight trends, TF tolerance  REASON FOR ASSESSMENT:   Consult Enteral/tube feeding initiation and management  ASSESSMENT:   75 year old male who presented to the ED on 12/20 as a Code Stroke. Pt received TNKase in the ED. PMH of EtOH abuse (stopped drinking in January 2022), polysubstance abuse, smoking, seizures. Pt admitted with R lacunar basal ganglia infarct likely secondary to small vessel disease.  12/23 - Cortrak placed (tip gastric)  Noted pt's urine tested positive for cocaine and marijuana.  Spoke with pt at bedside. Pt reports that he typically has a good appetite and good PO intake. Pt states that he does not eat regular meals but does snack/graze throughout the day. Pt states that he eats a wide variety of foods (speech unintelligible at times) but does not eat meat. When asked if he is a vegetarian, pt states "basically."  Pt reports a UBW of 120 lbs and that this has been his  weight throughout his older adult life. He denies any recent weight changes. Noted admission weight documented as 59.6 kg (131.4 lbs) which is down 1.6 kg from weight of 61.2 kg on 02/27/20. This is a 3% weight loss which is not significant for timeframe.  Pt denies any issues chewing or swallowing at baseline. He states that he does have acid reflux that can be painful at times. Pt reports that he feels slightly nauseous at this time but that it is not too bad.  Given severe malnutrition and polysubstance abuse, will start tube feeds at trickle rate and slowly advance to goal while monitoring labs for refeeding. Discussed plan with RN.  Medications reviewed and include: IV protonix, IV vimpat IVF: NS @ 70 ml/hr  Labs reviewed: sodium 134, ionized calcium 0.90, HDL 36, LDL 104  NUTRITION - FOCUSED PHYSICAL EXAM:  Flowsheet Row Most Recent Value  Orbital Region Severe depletion  Upper Arm Region Moderate depletion  Thoracic and Lumbar Region Severe depletion  Buccal Region Moderate depletion  Temple Region Severe depletion  Clavicle Bone Region Severe depletion  Clavicle and Acromion Bone Region Severe depletion  Scapular Bone Region Severe depletion  Dorsal Hand Moderate depletion  Patellar Region Severe depletion  Anterior Thigh Region Severe depletion  Posterior Calf Region Moderate depletion  Edema (RD Assessment) None  Hair Reviewed  Eyes Reviewed  Mouth Reviewed  Skin Reviewed  Nails Reviewed       Diet Order:   Diet Order  Diet NPO time specified  Diet effective now                   EDUCATION NEEDS:   Not appropriate for education at this time  Skin:  Skin Assessment: Reviewed RN Assessment  Last BM:  no documented BM  Height:   Ht Readings from Last 1 Encounters:  01/30/21 5\' 8"  (1.727 m)    Weight:   Wt Readings from Last 1 Encounters:  01/30/21 59.6 kg    BMI:  Body mass index is 19.98 kg/m.  Estimated Nutritional Needs:    Kcal:  1900-2100  Protein:  90-105 grams  Fluid:  1.9-2.1 L    Gustavus Bryant, MS, RD, LDN Inpatient Clinical Dietitian Please see AMiON for contact information.

## 2021-02-02 NOTE — Progress Notes (Signed)
Speech Language Pathology Treatment: Dysphagia  Patient Details Name: Douglas Spencer MRN: [id number] DOB: October 21, 1945 Today's Date: 02/02/2021 Time: 0881-1031 SLP Time Calculation (min) (ACUTE ONLY): 15 min  Assessment / Plan / Recommendation Clinical Impression  Pt was seen for dysphagia treatment. He was notably more alert than when he was last seen and speech intelligibility was improved. Pt stated that he would have left yesterday if he could have. He tolerated small, individual ice chips and 1/4 tsp boluses of puree without overt s/sx of aspiration, but exhibited a wet vocal quality and delayed coughing with full-tsp boluses of puree and with boluses of multiple ice chips. An inconsistently wet vocal quality was observed with thin liquids via spoon and cup. Anterior spillage was noted with cup sips. Pt continues to demonstrate reduced bolus awareness and oral holding. Cues were intermittently needed for pt to swallow but pt's oral/pharyngeal delay was improved to a max of 15 seconds on this date when considering time of bolus presentation and that of hyolaryngeal movement. SLP is in agreement with Cortrak placement. An NPO status is still recommended, but with allowance of small, individual ice chips after oral care once full supervision can be provided. SLP will continue to follow pt.     HPI HPI: Pt is a 75 y.o. male who presented to the ED secondary to right-sided gaze, left-sided weakness and drift, slurred speech. TNK given. 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 EEG 12/21,12/22 were WNL. 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      SLP Plan  Continue with current plan of care      Recommendations for follow up therapy are one component of a multi-disciplinary discharge planning process, led by the  attending physician.  Recommendations may be updated based on patient status, additional functional criteria and insurance authorization.    Recommendations  Diet recommendations: NPO (small, individual ice chips allowed it pt is adequately alert) Medication Administration: Via alternative means Postural Changes and/or Swallow Maneuvers: Seated upright 90 degrees                Oral Care Recommendations: Oral care BID Follow Up Recommendations: Acute inpatient rehab (3hours/day) Assistance recommended at discharge: Frequent or constant Supervision/Assistance SLP Visit Diagnosis: Dysphagia, unspecified (R13.10) Plan: Continue with current plan of care        Jesse Hirst I. Hardin Negus, [place], [place] Office number [phone] Pager [place]  02/02/2021, 11:20 AM

## 2021-02-02 NOTE — Progress Notes (Addendum)
STROKE TEAM PROGRESS NOTE      INTERVAL HISTORY Patient is seen in his room with no family at the bedside. Patient has failed swallow study twice and will need cortrak placement today.  Will transfer out of the ICU as he is hemodynamically stable with a stable neurological exam. Vitals:   02/02/21 0400 02/02/21 0500 02/02/21 0700 02/02/21 0800  BP: (!) 145/70 140/62 (!) 127/54 (!) 145/76  Pulse: (!) 59 (!) 56 (!) 51 (!) 55  Resp: 17 14 16 14   Temp: 98.6 F (37 C)   98.2 F (36.8 C)  TempSrc: Oral   Oral  SpO2: 93% 93% 92% 93%  Weight:      Height:       CBC:  Recent Labs  Lab 01/30/21 2144 01/30/21 2148 02/01/21 0057  WBC 8.9  --  8.7  NEUTROABS 4.8  --   --   HGB 17.3* 16.7 15.7  HCT 51.1 49.0 46.0  MCV 88.4  --  87.3  PLT 248  --  478    Basic Metabolic Panel:  Recent Labs  Lab 01/30/21 2144 01/30/21 2148 02/01/21 0057  NA 133* 134*  --   K 3.8 4.4  --   CL 103 106  --   CO2 21*  --   --   GLUCOSE 87 87  --   BUN 10 11  --   CREATININE 1.01 1.00 1.01  CALCIUM 9.2  --   --     Lipid Panel:  Recent Labs  Lab 01/31/21 0105  CHOL 152  TRIG 61  HDL 36*  CHOLHDL 4.2  VLDL 12  LDLCALC 104*    HgbA1c:  Recent Labs  Lab 01/31/21 0105  HGBA1C 5.3    Urine Drug Screen:  Recent Labs  Lab 01/31/21 0154  LABOPIA NONE DETECTED  COCAINSCRNUR POSITIVE*  LABBENZ NONE DETECTED  AMPHETMU NONE DETECTED  THCU POSITIVE*  LABBARB NONE DETECTED     Alcohol Level No results for input(s): ETH in the last 168 hours.  IMAGING past 24 hours MR BRAIN WO CONTRAST  Result Date: 02/01/2021 CLINICAL DATA:  Acute neuro deficit. Rule out stroke. History of seizure. EXAM: MRI HEAD WITHOUT CONTRAST TECHNIQUE: Multiplanar, multiecho pulse sequences of the brain and surrounding structures were obtained without intravenous contrast. COMPARISON:  CT head 01/31/2021 FINDINGS: Brain: Acute infarct lenticulostriate territory on the right. Infarct involves the body of the  caudate, putamen, external capsule, and corona radiata. No other acute infarct. Mild atrophy. Mild chronic microvascular ischemic change in the white matter. Negative for hemorrhage or mass. Vascular: Normal arterial flow voids in the skull base. Skull and upper cervical spine: No focal lesion. Sinuses/Orbits: Prominent superior ophthalmic vein bilaterally right greater than left. Review of the recent CTA head and neck 01/30/2021 reveals no significant enlargement of this vessel. No thrombus. No evidence of cavernous carotid fistula on CTA. Cavernous sinus is normal. No orbital mass. Mild mucosal edema paranasal sinuses. Other: None IMPRESSION: Acute infarct right lenticulostriate territory. Moderately large territory involved. No intracranial hemorrhage Atrophy and mild chronic microvascular ischemic change Prominent superior ophthalmic vein bilaterally right greater left best seen on axial and coronal FLAIR. No associated abnormality on recent CT angio. This may be flow related artifact Electronically Signed   By: Franchot Gallo M.D.   On: 02/01/2021 13:18    PHYSICAL EXAM  Temp:  [97.7 F (36.5 C)-98.6 F (37 C)] 98.2 F (36.8 C) (12/23 0800) Pulse Rate:  [51-74] 55 (12/23 0800) Resp:  [  14-22] 14 (12/23 0800) BP: (127-171)/(54-96) 145/76 (12/23 0800) SpO2:  [86 %-96 %] 93 % (12/23 0800)  Physical Exam General:  Alert, well-developed male in no acute distress   NEURO:  Mental Status: AA&Ox3  Speech/Language: Dysarthria present.  Naming, repetition, fluency, and comprehension intact.  Cranial Nerves:  II: PERRL. Visual fields full.  III, IV, VI: Right gaze preference which patient is able to overcome V: Sensation is intact to light touch and symmetrical to face.  VII: Left facial droop  VIII: hearing intact to voice. IX, X: Phonation is normal.  XII: tongue is midline without fasciculations. Motor: 5/5 strength to RUE and RLE.  0/5 strength to LUE and 2/5 strength to LLE Sensation-  Intact to light touch bilaterally. Extinction absent to light touch to DSS.   Gait- deferred  ASSESSMENT/PLAN Mr. Edgardo Petrenko is a 75 y.o. male with history of ETOH with seizure, non compliant with keppra, polysubstance use, and smoking presenting with left side weakness. He was found on the floor by his friend unable to move his left side. LKW 2045 on 01/30/2021. On arrival to the ED he was awake, alert, and talkative and moving his right side spontaneously. He was plegic in this left upper and lower extremity with gaze deviation to the right. TNK was given.  He became more lethargic over night on 12/21 at around 0500 and was taken for a repeat head CT and had an EEG ordered. MRI ordered for 12/22 demonstrates acute infarct in right lenticulostriate territory with no hemorrhage.   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  CTA head & neck - negative for LVO MRI  acute infarct in right lenticulostriate territory with no hemorrhage 2D Echo ejection fraction 60 to 65%.  No wall motion abnormality. 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  Patient advised to stop using due to stroke risk. Previous overdose in 2019 Obesity, Body mass index is 19.98 kg/m., BMI >/= 30 associated with increased stroke risk, recommend weight loss, diet and exercise as appropriate  Hx stroke/TIA Previous left basal ganglia infarct Unknown date Coronary artery disease Migraines Congestive heart failure  Other Active Problems  Hospital day # [place] , MSN, AGACNP-BC [facility] See Amion for schedule and pager information 02/02/2021 4:30 PM  ATTENDING ATTESTATION:  Pt with right BG CVA on MRI. On vimpat for seizures, EEG neg. Cortrak today and Tfs started. Transfer to floor bed today.  Dr. Reeves Forth evaluated pt independently, reviewed imaging, chart, labs. Discussed and formulated plan with the APP. Please see APP note above for details.     This patient is critically ill due to respiratory distress, stroke  and at significant risk of neurological worsening, death form heart failure, respiratory failure, recurrent stroke, bleeding from [facility], seizure, sepsis. This patient's care requires constant monitoring of vital signs, hemodynamics, respiratory and cardiac monitoring, review of multiple databases, neurological assessment, discussion with family, other specialists and medical decision making of high complexity. I spent 35 minutes of neurocritical care time in the care of this patient.   Shameek Nyquist,MD     To contact Stroke Continuity provider, please refer to [email or web address] After hours, contact General Neurology

## 2021-02-02 NOTE — Progress Notes (Signed)
Physical Therapy Treatment Patient Details Name: Douglas Spencer MRN: [id number] DOB: 05-21-1945 Today's Date: 02/02/2021   History of Present Illness 75 y.o. male who presented to the ED on 12/20 secondary to right-sided gaze, left-sided weakness and drift, slurred speech. TNK given. 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 EEG 12/21 was WNL. 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    PT Comments    Patient progressing towards physical therapy goals. With use of Stedy, patient perform sit to stand x 8 with minA+2 and intermittent use of R UE to assist. Patient willing to challenge self and attempt standing with no UE support but requires modA to maintain static standing balance. Worked on weight shifting and stepping in [place] frame. Continue to recommend CIR level therapies to assist with maximizing functional mobility and safety as patient is motivated and wants to return to independence.     Recommendations for follow up therapy are one component of a multi-disciplinary discharge planning process, led by the attending physician.  Recommendations may be updated based on patient status, additional functional criteria and insurance authorization.  Follow Up Recommendations  Acute inpatient rehab (3hours/day)     Assistance Recommended at Discharge Frequent or constant Supervision/Assistance  Equipment Recommendations  Other (comment) (TBD)    Recommendations for Other Services       Precautions / Restrictions Precautions Precautions: Fall Restrictions Weight Bearing Restrictions: No     Mobility  Bed Mobility Overal bed mobility: Needs Assistance Bed Mobility: Supine to Sit     Supine to sit: Mod assist     General bed mobility comments: modA for bringing L LE towards EOB and trunk elevation  towards L side of bed.    Transfers Overall transfer level: Needs assistance Equipment used: Ambulation equipment used Transfers: Sit to/from Stand;Bed to chair/wheelchair/BSC Sit to Stand: Min assist;+2 physical assistance Stand pivot transfers: Total assist;+2 physical assistance         General transfer comment: minA+2 to stand from bed to [facility]. Transferred to recliner with Stedy. Sit to stand x 8 for strengthening intervention. Worked on weightshifting and stepping in [place] frame. Patient able to perform sit to stands with no UE support but increased effort. Transfer via Lift Equipment: Stedy  Ambulation/Gait               General Gait Details: unable   Marine scientist Rankin (Stroke Patients Only) Modified Rankin (Stroke Patients Only) Pre-Morbid Rankin Score: No symptoms Modified Rankin: Severe disability     Balance Overall balance assessment: Needs assistance Sitting-balance support: Single extremity supported;Feet supported Sitting balance-Leahy Scale: Fair Sitting balance - Comments: cues to maintain midline at times. Close supervision required   Standing balance support: Single extremity supported Standing balance-Leahy Scale: Poor Standing balance comment: requires cues to maintain midline in standing due to L lateral lean                            Cognition Arousal/Alertness: Awake/alert Behavior During Therapy: Flat affect Overall Cognitive Status: Impaired/Different from baseline Area of Impairment: Orientation;Memory;Following commands;Safety/judgement;Attention;Problem solving;Awareness                 Orientation Level: Disoriented to;Situation Current Attention Level: Sustained Memory: Decreased  short-term memory;Decreased recall of precautions Following Commands: Follows one step commands inconsistently Safety/Judgement: Decreased awareness of safety;Decreased awareness of  deficits Awareness: Intellectual Problem Solving: Slow processing;Decreased initiation General Comments: continues to have poor insight into deficits and situation. Follows commands inconsistently and requires increased time to process. Perseverative over wanting food and water.        Exercises Other Exercises Other Exercises: sit to stand x 8 with Stedy and minA+2    General Comments        Pertinent Vitals/Pain Pain Assessment: No/denies pain    Home Living                          Prior Function            PT Goals (current goals can now be found in the care plan section) Acute Rehab PT Goals Patient Stated Goal: "water" PT Goal Formulation: With patient Time For Goal Achievement: 02/14/21 Potential to Achieve Goals: Good Progress towards PT goals: Progressing toward goals    Frequency    Min 4X/week      PT Plan Current plan remains appropriate    Co-evaluation              AM-PAC PT "6 Clicks" Mobility   Outcome Measure  Help needed turning from your back to your side while in a flat bed without using bedrails?: A Lot Help needed moving from lying on your back to sitting on the side of a flat bed without using bedrails?: A Lot Help needed moving to and from a bed to a chair (including a wheelchair)?: Total Help needed standing up from a chair using your arms (e.g., wheelchair or bedside chair)?: Total Help needed to walk in hospital room?: Total Help needed climbing 3-5 steps with a railing? : Total 6 Click Score: 8    End of Session Equipment Utilized During Treatment: Gait belt Activity Tolerance: Patient tolerated treatment well Patient left: in chair;with call bell/phone within reach;with chair alarm set Nurse Communication: Mobility status PT Visit Diagnosis: Unsteadiness on feet (R26.81);Muscle weakness (generalized) (M62.81);Difficulty in walking, not elsewhere classified (R26.2)     Time: 1433-1500 PT Time Calculation  (min) (ACUTE ONLY): 27 min  Charges:  $Therapeutic Exercise: 8-22 mins $Neuromuscular Re-education: 8-22 mins                     Dwanna Goshert A. Gilford Rile PT, DPT Acute Rehabilitation Services Pager [phone] Office [phone]    Linna Hoff 02/02/2021, 4:51 PM

## 2021-02-02 NOTE — Procedures (Signed)
Cortrak  Person Inserting Tube:  Douglas Spencer, RD Tube Type:  Cortrak - 43 inches Tube Size:  10 Tube Location:  Left nare Initial Placement:  Stomach Secured by: Bridle Technique Used to Measure Tube Placement:  Marking at nare/corner of mouth Cortrak Secured At:  69 cm  Cortrak Tube Team Note:  Consult received to place a Cortrak feeding tube.   X-ray is required, abdominal x-ray has been ordered by the Cortrak team. Please confirm tube placement before using the Cortrak tube.   If the tube becomes dislodged please keep the tube and contact the Cortrak team at [email or web address] ([email or web address]) for replacement.  If after hours and replacement cannot be delayed, place a NG tube and confirm placement with an abdominal x-ray.    Lockie Pares., RD, LDN, CNSC See AMiON for contact information

## 2021-02-03 DIAGNOSIS — I639 Cerebral infarction, unspecified: Secondary | ICD-10-CM | POA: Diagnosis not present

## 2021-02-03 LAB — CBC
HCT: 43.7 % (ref 39.0–52.0)
Hemoglobin: 14.8 g/dL (ref 13.0–17.0)
MCH: 29.3 pg (ref 26.0–34.0)
MCHC: 33.9 g/dL (ref 30.0–36.0)
MCV: 86.5 fL (ref 80.0–100.0)
Platelets: 195 10*3/uL (ref 150–400)
RBC: 5.05 MIL/uL (ref 4.22–5.81)
RDW: 13.2 % (ref 11.5–15.5)
WBC: 7.3 10*3/uL (ref 4.0–10.5)
nRBC: 0 % (ref 0.0–0.2)

## 2021-02-03 LAB — GLUCOSE, CAPILLARY
Glucose-Capillary: 108 mg/dL — ABNORMAL HIGH (ref 70–99)
Glucose-Capillary: 133 mg/dL — ABNORMAL HIGH (ref 70–99)
Glucose-Capillary: 112 mg/dL — ABNORMAL HIGH (ref 70–99)
Glucose-Capillary: 115 mg/dL — ABNORMAL HIGH (ref 70–99)
Glucose-Capillary: 102 mg/dL — ABNORMAL HIGH (ref 70–99)

## 2021-02-03 LAB — BASIC METABOLIC PANEL
Anion gap: 8 (ref 5–15)
BUN: 13 mg/dL (ref 8–23)
CO2: 20 mmol/L — ABNORMAL LOW (ref 22–32)
Calcium: 8.2 mg/dL — ABNORMAL LOW (ref 8.9–10.3)
Chloride: 109 mmol/L (ref 98–111)
Creatinine, Ser: 0.83 mg/dL (ref 0.61–1.24)
GFR, Estimated: >60 mL/min (ref >60)
Glucose, Bld: 122 mg/dL — ABNORMAL HIGH (ref 70–99)
Potassium: 3.7 mmol/L (ref 3.5–5.1)
Sodium: 137 mmol/L (ref 135–145)

## 2021-02-03 LAB — MAGNESIUM
Magnesium: 2 mg/dL (ref 1.7–2.4)
Magnesium: 2.1 mg/dL (ref 1.7–2.4)

## 2021-02-03 LAB — PHOSPHORUS
Phosphorus: 1.8 mg/dL — ABNORMAL LOW (ref 2.5–4.6)
Phosphorus: 2.3 mg/dL — ABNORMAL LOW (ref 2.5–4.6)

## 2021-02-03 MED ORDER — HYDROXYZINE HCL 25 MG PO TABS
25.0000 mg | ORAL_TABLET | Freq: Four times a day (QID) | ORAL | Status: DC | PRN
  Administered 2021-02-03 – 2021-02-06 (×4): 25 mg
  Filled 2021-02-03 (×5): qty 1

## 2021-02-03 MED ORDER — POTASSIUM & SODIUM PHOSPHATES 280-160-250 MG PO PACK
2.0000 | PACK | Freq: Once | ORAL | Status: AC
  Administered 2021-02-03: 10:00:00 2
  Filled 2021-02-03: qty 2

## 2021-02-03 NOTE — Progress Notes (Addendum)
Inpatient Rehab Admissions Coordinator:   I received a message from MD stating Pt.'s roommate cannot care for Pt. And requesting reconsideration for rehab. I was able to reach Pt's roommate and he states that he can provide 24/7 min-mod assist, but cannot manage pt. With current deficits and believes he needs rehab. He states he will plan to discuss with pt. And let him know he can't care for him without Pt. Going to rehab. I will touch base with Pt. After that conversation and likely pursue for CIR.   Clemens Catholic, [place], [place] Admissions Coordinator  [phone] ([place]) [phone] (office)

## 2021-02-03 NOTE — Progress Notes (Addendum)
STROKE TEAM PROGRESS NOTE      INTERVAL HISTORY Patient is seen in his room with no family at the bedside. He c/o anxiety- atarax ordered. Tube feeds will reach goal today.  He verbalizes a desire to go home and is frustrated that he is in the hospital over the holiday.  He will hopefully be able to go to CIR within the next few days. Vitals:   02/03/21 0500 02/03/21 0600 02/03/21 0700 02/03/21 0800  BP: (!) 150/82 (!) 156/138 (!) 156/78 (!) 149/94  Pulse: (!) 54 83 (!) 55 68  Resp: 15 19 14  (!) 21  Temp:    (!) 97.4 F (36.3 C)  TempSrc:    Oral  SpO2: 93% 91% 92% 94%  Weight:      Height:       CBC:  Recent Labs  Lab 01/30/21 2144 01/30/21 2148 02/02/21 1040 02/03/21 0426  WBC 8.9   < > 7.4 7.3  NEUTROABS 4.8  --   --   --   HGB 17.3*   < > 15.6 14.8  HCT 51.1   < > 45.8 43.7  MCV 88.4   < > 86.9 86.5  PLT 248   < > 221 195   < > = values in this interval not displayed.    Basic Metabolic Panel:  Recent Labs  Lab 02/02/21 1040 02/02/21 1643 02/03/21 0426  NA 139  --  137  K 4.0  --  3.7  CL 109  --  109  CO2 17*  --  20*  GLUCOSE 72  --  122*  BUN 13  --  13  CREATININE 1.02  --  0.83  CALCIUM 8.6*  --  8.2*  MG  --  1.9 2.0  PHOS  --  2.5 1.8*    Lipid Panel:  Recent Labs  Lab 01/31/21 0105  CHOL 152  TRIG 61  HDL 36*  CHOLHDL 4.2  VLDL 12  LDLCALC 104*    HgbA1c:  Recent Labs  Lab 01/31/21 0105  HGBA1C 5.3    Urine Drug Screen:  Recent Labs  Lab 01/31/21 0154  LABOPIA NONE DETECTED  COCAINSCRNUR POSITIVE*  LABBENZ NONE DETECTED  AMPHETMU NONE DETECTED  THCU POSITIVE*  LABBARB NONE DETECTED     Alcohol Level No results for input(s): ETH in the last 168 hours.  IMAGING past 24 hours DG Abd Portable 1V  Result Date: 02/02/2021 CLINICAL DATA:  NG tube placement EXAM: PORTABLE ABDOMEN - 1 VIEW COMPARISON:  None. FINDINGS: Weighted tip enteric tube with distal tip in the body of the stomach. Nonobstructive bowel gas pattern.  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 IMPRESSION: Weighted tip enteric tube with distal tip in the body of the stomach. Electronically Signed   By: Keane Police D.O.   On: 02/02/2021 12:43    PHYSICAL EXAM  Temp:  [97.4 F (36.3 C)-98.9 F (37.2 C)] 97.4 F (36.3 C) (12/24 0800) Pulse Rate:  [51-83] 68 (12/24 0800) Resp:  [13-21] 21 (12/24 0800) BP: (131-166)/(71-138) 149/94 (12/24 0800) SpO2:  [89 %-95 %] 94 % (12/24 0800) Weight:  [59.6 kg] 59.6 kg (12/24 0359)  Physical Exam General:  Alert, well-developed male in no acute distress   NEURO:  Mental Status: AA&Ox3  Speech/Language: Dysarthria present.  Naming, repetition, fluency, and comprehension intact.  Cranial Nerves:  II: PERRL. Visual fields full.  III, IV, VI: Right gaze preference which patient is able to overcome V: Sensation is intact to  light touch and symmetrical to face.  VII: Left facial droop  VIII: hearing intact to voice. IX, X: Phonation is normal.  XII: tongue is midline without fasciculations. Motor: 5/5 strength to RUE and RLE.  0/5 strength to LUE and 4/5 strength to LLE Sensation- Intact to light touch bilaterally. Extinction absent to light touch to DSS.   Gait- deferred  ASSESSMENT/PLAN Mr. Ermias Tomeo is a 75 y.o. male with history of ETOH with seizure, non compliant with keppra, polysubstance use, and smoking presenting with left side weakness. He was found on the floor by his friend unable to move his left side. LKW 2045 on 01/30/2021. On arrival to the ED he was awake, alert, and talkative and moving his right side spontaneously. He was plegic in this left upper and lower extremity with gaze deviation to the right. TNK was given.  He became more lethargic over night on 12/21 at around 0500 and was taken for a repeat head CT and had an EEG ordered. MRI ordered for 12/22 demonstrates acute infarct in right lenticulostriate territory with no hemorrhage. He will hopefully be ready to  go to CIR within the next few days.  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  CTA head & neck - negative for LVO MRI  acute infarct in right lenticulostriate territory with no hemorrhage 2D Echo ejection fraction 60 to 65%.  No wall motion abnormality. 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 Patient advised to stop using due to stroke risk. Previous overdose in 2019 Obesity, Body mass index is 19.98 kg/m., BMI >/= 30 associated with increased stroke risk, recommend weight loss, diet and exercise as appropriate  Hx stroke/TIA Previous left basal ganglia infarct Unknown date Coronary artery disease Migraines Congestive heart failure  Other Active Problems  Hospital day # [place] , MSN, AGACNP-BC [facility] See Amion for schedule and pager information 02/03/2021 8:38 AM  ATTENDING ATTESTATION:  PT with Right BG CVA. He wants to go home especially due to the holidays but his roommate cannot drive.  He does not have the insight to make the decision on going home. He will need inpt rehab and  cannot go home. Discussed yesterday that his roommate can visit him in the hospital via taxi but he has not come to visit.   Dr. Reeves Forth evaluated pt independently, reviewed imaging, chart, labs. Discussed and formulated plan with the APP. Please see APP note above for details.   Total 30 minutes spent on counseling patient and coordinating care, writing notes and reviewing chart.  Blayne Garlick,MD   To contact Stroke Continuity provider, please refer to [email or web address] After hours, contact General  Neurology

## 2021-02-03 NOTE — Progress Notes (Signed)
Inpatient Rehab Admissions Coordinator:   I spoke at length with Pt. Regarding potential CIR admit. Pt states that he does not want to go to any rehab and wishes to go home with his roommate, Maeola Harman, who he reports can provide 24/7 physical assistance. I left a voicemail for Mr. Sink with request for callback.. I will not pursue for CIR admit unless Pt. Changes his mind.  Clemens Catholic, [place], [place] Admissions Coordinator  [phone] ([place]) [phone] (office)

## 2021-02-03 NOTE — Progress Notes (Signed)
Inpatient Rehab Admissions Coordinator:  ? ?Per therapy recommendations,  patient was screened for CIR candidacy by Avory Rahimi, MS, CCC-SLP. At this time, Pt. Appears to be a a potential candidate for CIR. I will place   order for rehab consult per protocol for full assessment. Please contact me any with questions. ? ?Beatriz Settles, MS, CCC-SLP ?Rehab Admissions Coordinator  ?[phone] ([place]) ?[phone] (office) ? ?

## 2021-02-04 DIAGNOSIS — I639 Cerebral infarction, unspecified: Secondary | ICD-10-CM | POA: Diagnosis not present

## 2021-02-04 LAB — CBC
HCT: 42 % (ref 39.0–52.0)
Hemoglobin: 14.6 g/dL (ref 13.0–17.0)
MCH: 29.8 pg (ref 26.0–34.0)
MCHC: 34.8 g/dL (ref 30.0–36.0)
MCV: 85.7 fL (ref 80.0–100.0)
Platelets: 185 10*3/uL (ref 150–400)
RBC: 4.9 MIL/uL (ref 4.22–5.81)
RDW: 13.2 % (ref 11.5–15.5)
WBC: 6.7 10*3/uL (ref 4.0–10.5)
nRBC: 0 % (ref 0.0–0.2)

## 2021-02-04 LAB — GLUCOSE, CAPILLARY
Glucose-Capillary: 104 mg/dL — ABNORMAL HIGH (ref 70–99)
Glucose-Capillary: 111 mg/dL — ABNORMAL HIGH (ref 70–99)
Glucose-Capillary: 111 mg/dL — ABNORMAL HIGH (ref 70–99)
Glucose-Capillary: 112 mg/dL — ABNORMAL HIGH (ref 70–99)
Glucose-Capillary: 128 mg/dL — ABNORMAL HIGH (ref 70–99)
Glucose-Capillary: 99 mg/dL (ref 70–99)

## 2021-02-04 LAB — BASIC METABOLIC PANEL
Anion gap: 6 (ref 5–15)
BUN: 15 mg/dL (ref 8–23)
CO2: 22 mmol/L (ref 22–32)
Calcium: 8.2 mg/dL — ABNORMAL LOW (ref 8.9–10.3)
Chloride: 111 mmol/L (ref 98–111)
Creatinine, Ser: 0.83 mg/dL (ref 0.61–1.24)
GFR, Estimated: >60 mL/min (ref >60)
Glucose, Bld: 122 mg/dL — ABNORMAL HIGH (ref 70–99)
Potassium: 3.6 mmol/L (ref 3.5–5.1)
Sodium: 139 mmol/L (ref 135–145)

## 2021-02-04 LAB — MAGNESIUM
Magnesium: 1.9 mg/dL (ref 1.7–2.4)
Magnesium: 2 mg/dL (ref 1.7–2.4)

## 2021-02-04 LAB — PHOSPHORUS
Phosphorus: 2.5 mg/dL (ref 2.5–4.6)
Phosphorus: 3 mg/dL (ref 2.5–4.6)

## 2021-02-04 NOTE — Progress Notes (Addendum)
STROKE TEAM PROGRESS NOTE      INTERVAL HISTORY Patient is seen in his room with no family at the bedside. He expresses a desire to go home.  Rehab working on [employer] bed. Vitals:   02/03/21 2313 02/04/21 0402 02/04/21 0751 02/04/21 1307  BP: (!) 167/93 134/82 133/76 136/82  Pulse: 63 62 65 66  Resp: 19 18 20 17   Temp: 98.1 F (36.7 C) 98.3 F (36.8 C) 98.5 F (36.9 C) 98.1 F (36.7 C)  TempSrc: Oral Oral Oral Oral  SpO2: 99% 95% 95% 98%  Weight:      Height:       CBC:  Recent Labs  Lab 01/30/21 2144 01/30/21 2148 02/03/21 0426 02/04/21 0650  WBC 8.9   < > 7.3 6.7  NEUTROABS 4.8  --   --   --   HGB 17.3*   < > 14.8 14.6  HCT 51.1   < > 43.7 42.0  MCV 88.4   < > 86.5 85.7  PLT 248   < > 195 185   < > = values in this interval not displayed.   Basic Metabolic Panel:  Recent Labs  Lab 02/03/21 0426 02/03/21 1602 02/04/21 0650  NA 137  --  139  K 3.7  --  3.6  CL 109  --  111  CO2 20*  --  22  GLUCOSE 122*  --  122*  BUN 13  --  15  CREATININE 0.83  --  0.83  CALCIUM 8.2*  --  8.2*  MG 2.0 2.1 1.9  PHOS 1.8* 2.3* 2.5   Lipid Panel:  Recent Labs  Lab 01/31/21 0105  CHOL 152  TRIG 61  HDL 36*  CHOLHDL 4.2  VLDL 12  LDLCALC 104*   HgbA1c:  Recent Labs  Lab 01/31/21 0105  HGBA1C 5.3   Urine Drug Screen:  Recent Labs  Lab 01/31/21 0154  LABOPIA NONE DETECTED  COCAINSCRNUR POSITIVE*  LABBENZ NONE DETECTED  AMPHETMU NONE DETECTED  THCU POSITIVE*  LABBARB NONE DETECTED    Alcohol Level No results for input(s): ETH in the last 168 hours.  IMAGING past 24 hours No results found.  PHYSICAL EXAM  Temp:  [97.1 F (36.2 C)-98.5 F (36.9 C)] 98.1 F (36.7 C) (12/25 1307) Pulse Rate:  [57-66] 66 (12/25 1307) Resp:  [15-20] 17 (12/25 1307) BP: (133-167)/(76-134) 136/82 (12/25 1307) SpO2:  [95 %-100 %] 98 % (12/25 1307)  Physical Exam General:  Alert, well-developed male in no acute distress  Sitting up in chair today.  NEURO:  Mental  Status: AA&Ox3  Speech/Language: Dysarthria present.  Naming and comprehension intact.  Cranial Nerves:  II: PERRL. Visual fields full.  III, IV, VI: Right gaze preference which patient is able to overcome V: Sensation is intact to light touch and symmetrical to face.  VII: Left facial droop  VIII: hearing intact to voice. IX, X: Phonation is normal.  XII: tongue is midline without fasciculations. Motor: 5/5 strength to RUE and RLE.  0/5 strength to LUE and 4/5 strength to LLE Sensation- Intact to light touch bilaterally. Extinction absent to light touch to DSS.   Gait- deferred  ASSESSMENT/PLAN Douglas Spencer is a 75 y.o. male with history of ETOH with seizure, non compliant with keppra, polysubstance use, and smoking presenting with left side weakness. He was found on the floor by his friend unable to move his left side. LKW 2045 on 01/30/2021. On arrival to the ED he was awake, alert,  and talkative and moving his right side spontaneously. He was plegic in this left upper and lower extremity with gaze deviation to the right. TNK was given.  He became more lethargic over night on 12/21 at around 0500 and was taken for a repeat head CT and had an EEG ordered. MRI ordered for 12/22 demonstrates acute infarct in right lenticulostriate territory with no hemorrhage. He will hopefully be ready to go to CIR within the next few days.  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  CTA head & neck - negative for LVO MRI  acute infarct in right lenticulostriate territory with no hemorrhage 2D Echo ejection fraction 60 to 65%.  No wall motion abnormality. 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 Patient advised to stop using due to stroke risk. Previous overdose in 2019 Obesity, Body mass index is 19.98 kg/m., BMI >/= 30 associated with increased stroke risk, recommend weight loss, diet and exercise as appropriate  Hx stroke/TIA Previous left basal ganglia infarct Unknown date Coronary artery disease Migraines Congestive heart failure  Other Active Problems  Hospital day # 5  PT with Right BG CVA. He wants to go home especially due to the holidays but needs inpt rehab and cannot go home. Labs reviewed.   Total of 30 mins spent reviewing chart, discussion with patient and family on prognosis, Dx and plan. Discussed case with patient's nurse. Reviewed Imaging personally.   Douglas Elms,MD   To contact Stroke Continuity provider, please refer to [email or web address] After hours, contact General Neurology

## 2021-02-04 NOTE — Plan of Care (Signed)
°  Problem: Education: °Goal: Knowledge of disease or condition will improve °Outcome: Progressing °Goal: Knowledge of secondary prevention will improve (SELECT ALL) °Outcome: Progressing °Goal: Knowledge of patient specific risk factors will improve (INDIVIDUALIZE FOR PATIENT) °Outcome: Progressing °Goal: Individualized Educational Video(s) °Outcome: Progressing °  °Problem: Coping: °Goal: Will verbalize positive feelings about self °Outcome: Progressing °Goal: Will identify appropriate support needs °Outcome: Progressing °  °Problem: Health Behavior/Discharge Planning: °Goal: Ability to manage health-related needs will improve °Outcome: Progressing °  °Problem: Self-Care: °Goal: Ability to participate in self-care as condition permits will improve °Outcome: Progressing °Goal: Verbalization of feelings and concerns over difficulty with self-care will improve °Outcome: Progressing °Goal: Ability to communicate needs accurately will improve °Outcome: Progressing °  °Problem: Nutrition: °Goal: Risk of aspiration will decrease °Outcome: Progressing °Goal: Dietary intake will improve °Outcome: Progressing °  °Problem: Ischemic Stroke/TIA Tissue Perfusion: °Goal: Complications of ischemic stroke/TIA will be minimized °Outcome: Progressing °  °Problem: Education: °Goal: Knowledge of General Education information will improve °Description: Including pain rating scale, medication(s)/side effects and non-pharmacologic comfort measures °Outcome: Progressing °  °Problem: Health Behavior/Discharge Planning: °Goal: Ability to manage health-related needs will improve °Outcome: Progressing °  °Problem: Clinical Measurements: °Goal: Ability to maintain clinical measurements within normal limits will improve °Outcome: Progressing °Goal: Will remain free from infection °Outcome: Progressing °Goal: Diagnostic test results will improve °Outcome: Progressing °Goal: Respiratory complications will improve °Outcome: Progressing °Goal:  Cardiovascular complication will be avoided °Outcome: Progressing °  °Problem: Activity: °Goal: Risk for activity intolerance will decrease °Outcome: Progressing °  °Problem: Nutrition: °Goal: Adequate nutrition will be maintained °Outcome: Progressing °  °Problem: Coping: °Goal: Level of anxiety will decrease °Outcome: Progressing °  °Problem: Elimination: °Goal: Will not experience complications related to bowel motility °Outcome: Progressing °Goal: Will not experience complications related to urinary retention °Outcome: Progressing °  °Problem: Pain Managment: °Goal: General experience of comfort will improve °Outcome: Progressing °  °Problem: Safety: °Goal: Ability to remain free from injury will improve °Outcome: Progressing °  °Problem: Skin Integrity: °Goal: Risk for impaired skin integrity will decrease °Outcome: Progressing °  °

## 2021-02-05 DIAGNOSIS — F17200 Nicotine dependence, unspecified, uncomplicated: Secondary | ICD-10-CM

## 2021-02-05 DIAGNOSIS — I63311 Cerebral infarction due to thrombosis of right middle cerebral artery: Secondary | ICD-10-CM

## 2021-02-05 DIAGNOSIS — E7800 Pure hypercholesterolemia, unspecified: Secondary | ICD-10-CM | POA: Diagnosis not present

## 2021-02-05 DIAGNOSIS — R1312 Dysphagia, oropharyngeal phase: Secondary | ICD-10-CM

## 2021-02-05 DIAGNOSIS — F1410 Cocaine abuse, uncomplicated: Secondary | ICD-10-CM

## 2021-02-05 LAB — GLUCOSE, CAPILLARY
Glucose-Capillary: 118 mg/dL — ABNORMAL HIGH (ref 70–99)
Glucose-Capillary: 125 mg/dL — ABNORMAL HIGH (ref 70–99)
Glucose-Capillary: 85 mg/dL (ref 70–99)
Glucose-Capillary: 93 mg/dL (ref 70–99)
Glucose-Capillary: 94 mg/dL (ref 70–99)
Glucose-Capillary: 97 mg/dL (ref 70–99)

## 2021-02-05 LAB — BASIC METABOLIC PANEL
Anion gap: 7 (ref 5–15)
BUN: 17 mg/dL (ref 8–23)
CO2: 22 mmol/L (ref 22–32)
Calcium: 8.1 mg/dL — ABNORMAL LOW (ref 8.9–10.3)
Chloride: 110 mmol/L (ref 98–111)
Creatinine, Ser: 0.75 mg/dL (ref 0.61–1.24)
GFR, Estimated: >60 mL/min (ref >60)
Glucose, Bld: 123 mg/dL — ABNORMAL HIGH (ref 70–99)
Potassium: 3.6 mmol/L (ref 3.5–5.1)
Sodium: 139 mmol/L (ref 135–145)

## 2021-02-05 LAB — CBC
HCT: 42.5 % (ref 39.0–52.0)
Hemoglobin: 14.6 g/dL (ref 13.0–17.0)
MCH: 30 pg (ref 26.0–34.0)
MCHC: 34.4 g/dL (ref 30.0–36.0)
MCV: 87.4 fL (ref 80.0–100.0)
Platelets: 168 10*3/uL (ref 150–400)
RBC: 4.86 MIL/uL (ref 4.22–5.81)
RDW: 13.4 % (ref 11.5–15.5)
WBC: 6.9 10*3/uL (ref 4.0–10.5)
nRBC: 0 % (ref 0.0–0.2)

## 2021-02-05 LAB — MAGNESIUM: Magnesium: 1.9 mg/dL (ref 1.7–2.4)

## 2021-02-05 LAB — PHOSPHORUS: Phosphorus: 3.2 mg/dL (ref 2.5–4.6)

## 2021-02-05 MED ORDER — CLOPIDOGREL BISULFATE 75 MG PO TABS
75.0000 mg | ORAL_TABLET | Freq: Every day | ORAL | Status: DC
  Administered 2021-02-05 – 2021-02-06 (×2): 75 mg
  Filled 2021-02-05 (×2): qty 1

## 2021-02-05 MED ORDER — ASPIRIN 81 MG PO CHEW
81.0000 mg | CHEWABLE_TABLET | Freq: Every day | ORAL | Status: DC
  Administered 2021-02-06: 11:00:00 81 mg
  Filled 2021-02-05: qty 1

## 2021-02-05 MED ORDER — ATORVASTATIN CALCIUM 40 MG PO TABS
40.0000 mg | ORAL_TABLET | Freq: Every day | ORAL | Status: DC
  Administered 2021-02-06: 11:00:00 40 mg
  Filled 2021-02-05: qty 1

## 2021-02-05 MED ORDER — LACOSAMIDE 10 MG/ML PO SOLN
50.0000 mg | Freq: Two times a day (BID) | ORAL | Status: DC
Start: 2021-02-05 — End: 2021-02-05

## 2021-02-05 MED ORDER — LACOSAMIDE 50 MG PO TABS
50.0000 mg | ORAL_TABLET | Freq: Two times a day (BID) | ORAL | Status: DC
  Administered 2021-02-05 – 2021-02-06 (×2): 50 mg
  Filled 2021-02-05 (×2): qty 1

## 2021-02-05 NOTE — Progress Notes (Signed)
Speech Language Pathology Treatment: Dysphagia  Patient Details Name: Douglas Spencer MRN: [id number] DOB: 1946-01-20 Today's Date: 02/05/2021 Time: 0488-8916 SLP Time Calculation (min) (ACUTE ONLY): 50 min  Assessment / Plan / Recommendation Clinical Impression  Pt seen at bedside to assess readiness for po intake and/or instrumental study. Pt was seated upright in recliner with visitors present. Pt was noted to have an intermittently wet voice quality. Pt removed dentures for SLP to clean. Pt then performed oral care with assistance of suction. Pt required min assist to replace his dentures after cleaning. Following oral care, pt accepted boluses of honey thick liquids, nectar thick liquids, and puree textures. Pt exhibits dis-coordinated swallow and continued to have intermittent wet voicing during PO trials. Immediate cough response noted after only nectar thick liquid. Pt reports a history of globus sensation, but was not taking a PPI.   Recommend proceeding with MBS to objectively assess swallow function and safety and to identify least restrictive diet. RN and MD informed.   HPI HPI: Pt is a 75 y.o. male who presented to the ED secondary to right-sided gaze, left-sided weakness and drift, slurred speech. TNK given. 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 EEG 12/21,12/22 were WNL. 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      SLP Plan  Continue with current plan of care      Recommendations for follow up therapy are one component of a multi-disciplinary discharge planning process, led by the attending physician.  Recommendations may be updated based on patient status, additional functional criteria and insurance authorization.    Recommendations  Diet recommendations: NPO Medication Administration: Via  alternative means                Oral Care Recommendations: Oral care BID Follow Up Recommendations: Acute inpatient rehab (3hours/day) Assistance recommended at discharge: Frequent or constant Supervision/Assistance SLP Visit Diagnosis: Dysphagia, unspecified (R13.10) Plan: Continue with current plan of care          Yosselin Zoeller B. Quentin Ore, [facility], [place] Speech Language Pathologist Office: [phone]  Shonna Chock  02/05/2021, 3:19 PM

## 2021-02-05 NOTE — Progress Notes (Signed)
STROKE TEAM PROGRESS NOTE   INTERVAL HISTORY Speech therapist is working with pt for swallowing. He still has difficulty with oral secretions. He wanted to go home but I let him know that his roommate probably not able to take care of him at home with his current situation. He agrees with rehab now. He stated that he stopped taking keppra in 02/2020 due to wired feeling after taking keppra. OK with vimpat but have to find out the cost. Continue TF and will decreased IVF. change vmipat to PO. Starts DAPT. Rehab working on [employer] bed.  Vitals:   02/05/21 0013 02/05/21 0413 02/05/21 0500 02/05/21 1353  BP: (!) 157/85 (!) 162/99  (!) 143/74  Pulse: 65 60  63  Resp: 20 20  16   Temp: 97.6 F (36.4 C) 98.1 F (36.7 C)  98 F (36.7 C)  TempSrc: Oral Axillary  Oral  SpO2: 99% 96%    Weight:   61.6 kg   Height:       CBC:  Recent Labs  Lab 01/30/21 2144 01/30/21 2148 02/04/21 0650 02/05/21 0514  WBC 8.9   < > 6.7 6.9  NEUTROABS 4.8  --   --   --   HGB 17.3*   < > 14.6 14.6  HCT 51.1   < > 42.0 42.5  MCV 88.4   < > 85.7 87.4  PLT 248   < > 185 168   < > = values in this interval not displayed.   Basic Metabolic Panel:  Recent Labs  Lab 02/04/21 0650 02/04/21 1609 02/05/21 0514  NA 139  --  139  K 3.6  --  3.6  CL 111  --  110  CO2 22  --  22  GLUCOSE 122*  --  123*  BUN 15  --  17  CREATININE 0.83  --  0.75  CALCIUM 8.2*  --  8.1*  MG 1.9 2.0 1.9  PHOS 2.5 3.0 3.2   Lipid Panel:  Recent Labs  Lab 01/31/21 0105  CHOL 152  TRIG 61  HDL 36*  CHOLHDL 4.2  VLDL 12  LDLCALC 104*   HgbA1c:  Recent Labs  Lab 01/31/21 0105  HGBA1C 5.3   Urine Drug Screen:  Recent Labs  Lab 01/31/21 0154  LABOPIA NONE DETECTED  COCAINSCRNUR POSITIVE*  LABBENZ NONE DETECTED  AMPHETMU NONE DETECTED  THCU POSITIVE*  LABBARB NONE DETECTED    Alcohol Level No results for input(s): ETH in the last 168 hours.  IMAGING past 24 hours No results found.  PHYSICAL EXAM  Temp:  [97.6  F (36.4 C)-98.3 F (36.8 C)] 98 F (36.7 C) (12/26 1353) Pulse Rate:  [59-65] 63 (12/26 1353) Resp:  [16-20] 16 (12/26 1353) BP: (143-162)/(74-99) 143/74 (12/26 1353) SpO2:  [96 %-100 %] 96 % (12/26 0413) Weight:  [61.6 kg] 61.6 kg (12/26 0500)  Physical Exam General:  Alert, awake, well-developed male in no acute distress  NEURO:  Mental Status: AA&Ox3  Speech/Language: mild to moderate dysarthria present.  Naming and comprehension intact.  Cranial Nerves:  II: PERRL. Visual fields full.  III, IV, VI: Right gaze preference, but cross midline with incomplete left gaze V: Sensation is intact to light touch and symmetrical to face.  VII: Left facial droop  VIII: hearing intact to voice. IX, X: Phonation is normal.  XII: tongue is midline without fasciculations. Motor: 5/5 strength to RUE and RLE.  0/5 deltoid, 3-/5 bicep, 0/5 finger movement at LUE and 4/5 strength to  LLE Sensation- Intact to light touch bilaterally. Extinction absent to light touch to DSS.   Coordination - right FTN intact Gait- deferred  ASSESSMENT/PLAN Mr. Douglas Spencer is a 75 y.o. male with history of ETOH with seizure, non compliant with keppra, polysubstance use, and smoking presenting with left side weakness. He was found on the floor by his friend unable to move his left side. LKW 2045 on 01/30/2021. On arrival to the ED he was awake, alert, and talkative and moving his right side spontaneously. He was plegic in this left upper and lower extremity with gaze deviation to the right. TNK was given.  He became more lethargic over night on 12/21 at around 0500 and was taken for a repeat head CT and had an EEG ordered. MRI ordered for 12/22 demonstrates acute infarct in right lenticulostriate territory with no hemorrhage. He will hopefully be ready to go to CIR within the next few days.  Stroke:  Right BG infarct likely secondary to small vessel disease given location and stroke risk factors Code Stroke -CT head  No acute abnormality.  CTA head & neck - negative for LVO MRI  acute infarct in right lenticulostriate territory with no hemorrhage 2D Echo EF 60 to 65%.  No wall motion abnormality. LDL 104 HgbA1c 5.3 UDS positive for cocaine and THC VTE prophylaxis - lovenox No antithrombotic prior to admission, now on ASA 81 and plavix DAPT for 3 weeks and then ASA alone Therapy recommendations: Inpatient rehab Disposition:  pending   Hypertension Home meds- None Stable Permissive hypertension (OK if < 220/120) but gradually normalize in 5-7 days Long-term BP goal normotensive  Hx of seizure 02/2020 ED visit for seizure with head turning to right with body locking up and genralized shaking for 3-5 min. Then post ictal with right sided weakness, left gaze and aphasia. MRI negative for stroke. EEG cancelled as pt left AMA. Given the focality of the seizure activity, started on keppra 500 bid Pt stopped keppra shortly after ED discharge due to wired feeling taking meds Now on vimpat 50 bid EEG- no seizure or epileptiform discharges seen throughout the recording  Hyperlipidemia Home meds:  None LDL 104, goal < 70  Atorvastatin 40 mg daily Continue statin at discharge  Dysarthria  Did not pass swallow On TF@ 53 and IVF @ 28 Speech on board  Tobacco abuse Current smoker Smoking cessation counseling provided Pt is willing to quit  Substance abuse UDS positive for cocaine and THC every time  Previous overdose in 2019 Cessation education provided Pt is willing to quit  Other Stroke Risk Factors Advanced Age >/= 65  ETOH use, alcohol level <10, advised to drink no more than 1/2 drink(s) a day Quit January 2022 Hx stroke/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 After hours, contact General  Neurology

## 2021-02-05 NOTE — Progress Notes (Signed)
Physical Therapy Treatment Patient Details Name: Douglas Spencer MRN: [id number] DOB: [birth year] Today's Date: 02/05/2021   History of Present Illness 75 y.o. male who presented to the ED on 12/20 secondary to right-sided gaze, left-sided weakness and drift, slurred speech. TNK given. 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 EEG 12/21 was WNL. 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    PT Comments    Pt received in chair, had been sitting up >5 hours but decreased insight into need for positional changes, RN requesting PTA assist to get patient back to bed from chair. Pt needing up to modA for stand pivot transfer via stronger Rt side and modA for BLE lifting to perform sit>supine transition. Pt continues to have increased secretions notable while he speaks, RN present and able to suction him. Emphasis on pressure relief strategies, supine BLE exercises and importance of improving attention to Lt side, pt receptive to instruction. Pt continues to benefit from PT services to progress toward functional mobility goals. Continue to recommend   Recommendations for follow up therapy are one component of a multi-disciplinary discharge planning process, led by the attending physician.  Recommendations may be updated based on patient status, additional functional criteria and insurance authorization.  Follow Up Recommendations  Acute inpatient rehab (3hours/day)     Assistance Recommended at Discharge Frequent or constant Supervision/Assistance  Equipment Recommendations  Other (comment) (TBD)    Recommendations for Other Services Rehab consult     Precautions / Restrictions Precautions Precautions: Fall Precaution Comments: L inattention Restrictions Weight Bearing Restrictions: No     Mobility  Bed  Mobility Overal bed mobility: Needs Assistance Bed Mobility: Sit to Sidelying Rolling: Min assist       Sit to sidelying: Mod assist General bed mobility comments: pt instructed on hooking "good" Rt leg under LLE to assist with return to supine but still needs modA for BLE assist and minA for rolling/repositioning in bed.    Transfers Overall transfer level: Needs assistance Equipment used: 1 person hand held assist Transfers: Sit to/from Stand;Bed to chair/wheelchair/BSC Sit to Stand: Mod assist Stand pivot transfers: Mod assist         General transfer comment: from chair via face to face technique pt stands and assisted to pivot back to bed via stronger Rt side    Ambulation/Gait                   Stairs             Wheelchair Mobility    Modified Rankin (Stroke Patients Only) Modified Rankin (Stroke Patients Only) Pre-Morbid Rankin Score: No symptoms Modified Rankin: Moderately severe disability     Balance Overall balance assessment: Needs assistance Sitting-balance support: Single extremity supported;Feet supported Sitting balance-Leahy Scale: Fair Sitting balance - Comments: cues to maintain midline at times. Close supervision required.   Standing balance support: Single extremity supported Standing balance-Leahy Scale: Poor Standing balance comment: requires cues to maintain midline in standing due to Lt lateral lean with poor carryover                            Cognition Arousal/Alertness: Awake/alert Behavior During Therapy: Flat affect Overall Cognitive Status: Impaired/Different from baseline Area of Impairment: Orientation;Memory;Following commands;Safety/judgement;Attention;Problem solving;Awareness  Orientation Level: Disoriented to;Situation Current Attention Level: Sustained Memory: Decreased short-term memory;Decreased recall of precautions Following Commands: Follows one step commands  inconsistently Safety/Judgement: Decreased awareness of safety;Decreased awareness of deficits Awareness: Intellectual Problem Solving: Slow processing;Decreased initiation;Requires verbal cues General Comments: Continued Rt gaze preference but with max cues he was able to look to Lt visual field. Pt had been up in chair >5 hours but was reluctant to get back to bed.        Exercises Other Exercises Other Exercises: supine LLE AAROM: ankle pumps, heel slides, LAQ x10 reps ea Other Exercises: supine RLE AROM: ankle pumps, heel slides, hip abduction x5-10 reps ea Other Exercises: STS x1 Other Exercises: cues for glute sets x10 reps in supine    General Comments General comments (skin integrity, edema, etc.): pt with heavy secretion-laden speech (cortrak in place but paused during mobility), RN aware and in room to suction pre/post mobility      Pertinent Vitals/Pain Pain Assessment: No/denies pain    Home Living                          Prior Function            PT Goals (current goals can now be found in the care plan section) Acute Rehab PT Goals Patient Stated Goal: To get stronger and walk so I can go home and see my dog. PT Goal Formulation: With patient Time For Goal Achievement: 02/14/21 Progress towards PT goals: Progressing toward goals    Frequency    Min 4X/week      PT Plan Current plan remains appropriate    Co-evaluation              AM-PAC PT "6 Clicks" Mobility   Outcome Measure  Help needed turning from your back to your side while in a flat bed without using bedrails?: A Little Help needed moving from lying on your back to sitting on the side of a flat bed without using bedrails?: A Lot Help needed moving to and from a bed to a chair (including a wheelchair)?: A Lot Help needed standing up from a chair using your arms (e.g., wheelchair or bedside chair)?: A Lot Help needed to walk in hospital room?: A Lot Help needed climbing 3-5  steps with a railing? : Total 6 Click Score: 12    End of Session Equipment Utilized During Treatment: Gait belt Activity Tolerance: Patient tolerated treatment well Patient left: with call bell/phone within reach;in bed;with bed alarm set;with nursing/sitter in room;Other (comment) (HOB >30 deg due to tube feeds) Nurse Communication: Mobility status;Need for lift equipment [facility] for OOB pivot transfers) PT Visit Diagnosis: Unsteadiness on feet (R26.81);Muscle weakness (generalized) (M62.81);Difficulty in walking, not elsewhere classified (R26.2)     Time: 7416-3845 PT Time Calculation (min) (ACUTE ONLY): 16 min  Charges:  $Therapeutic Activity: 8-22 mins                     Kohl Polinsky P., PTA Acute Rehabilitation Services Pager: [phone] Office: [place] 02/05/2021, 5:15 PM

## 2021-02-05 NOTE — Progress Notes (Signed)
Physical Therapy Treatment Patient Details Name: Douglas Spencer MRN: [id number] DOB: Nov 21, 1945 Today's Date: 02/05/2021   History of Present Illness 75 y.o. male who presented to the ED on 12/20 secondary to right-sided gaze, left-sided weakness and drift, slurred speech. TNK given. 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 EEG 12/21 was WNL. 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    PT Comments    Pt received in supine, eager to participate in PT/OT session and with good tolerance for bed mobility, transfer training and short gait trial at bedside. Pt with continued L inattention but receptive to instruction on gentle self-ROM (no family present for instruction so his chair was turned so TV is to his midline rather than Rt side for attentional purposes) and pt able to perform some AAROM with L elbow and shoulder flexion. L wrist/hand remain flaccid. Pt with left lean in stance at L PFRW and needing up to +2 modA for short gait trial with close chair follow. Pt continues to benefit from PT services to progress toward functional mobility goals. Pt very motivated to progress OOB mobility and with good activity tolerance, also eager to work with SLP he remains a good candidate for high intensity post-acute rehab.   Recommendations for follow up therapy are one component of a multi-disciplinary discharge planning process, led by the attending physician.  Recommendations may be updated based on patient status, additional functional criteria and insurance authorization.  Follow Up Recommendations  Acute inpatient rehab (3hours/day)     Assistance Recommended at Discharge Frequent or constant Supervision/Assistance  Equipment Recommendations  Other (comment) (TBD)    Recommendations for Other Services Rehab  consult     Precautions / Restrictions Precautions Precautions: Fall Precaution Comments: L inattention Restrictions Weight Bearing Restrictions: No     Mobility  Bed Mobility Overal bed mobility: Needs Assistance Bed Mobility: Supine to Sit Rolling: Min assist;+2 for safety/equipment         General bed mobility comments: increased time, pt cued to use RUE to assist with advancing LUE while sitting up with good carryover.    Transfers Overall transfer level: Needs assistance Equipment used: Left platform walker Transfers: Sit to/from Stand;Bed to chair/wheelchair/BSC Sit to Stand: +2 safety/equipment;Mod assist           General transfer comment: pt able to rise with minA but needs up to Luke for steadying within L platform RW and postural cues    Ambulation/Gait Ambulation/Gait assistance: Mod assist;+2 physical assistance Gait Distance (Feet): 10 Feet Assistive device: Left platform walker Gait Pattern/deviations: Step-to pattern;Decreased step length - left;Decreased dorsiflexion - left;Drifts right/left;Narrow base of support       General Gait Details: pt needing max cues for step sequencing and L PFRW use and needs manual assist at times to advance RW. Pt with narrow BOS and Lt lean that increases with gait distance.    Modified Rankin (Stroke Patients Only) Modified Rankin (Stroke Patients Only) Pre-Morbid Rankin Score: No symptoms Modified Rankin: Moderately severe disability     Balance Overall balance assessment: Needs assistance Sitting-balance support: Single extremity supported;Feet supported Sitting balance-Leahy Scale: Fair Sitting balance - Comments: cues to maintain midline at times. Close supervision required.   Standing balance support: Single extremity supported Standing balance-Leahy Scale: Poor Standing balance comment: requires cues to maintain midline in standing due to L lateral lean  with poor carryover              Cognition Arousal/Alertness: Awake/alert Behavior During Therapy: Flat affect Overall Cognitive Status: Impaired/Different from baseline Area of Impairment: Orientation;Memory;Following commands;Safety/judgement;Attention;Problem solving;Awareness       Orientation Level: Disoriented to;Situation Current Attention Level: Sustained Memory: Decreased short-term memory;Decreased recall of precautions Following Commands: Follows one step commands inconsistently Safety/Judgement: Decreased awareness of safety;Decreased awareness of deficits Awareness: Intellectual Problem Solving: Slow processing;Decreased initiation;Requires verbal cues General Comments: Pt with decreased insight into deficits and situation but appears slightly improved from previous session. Follows commands fairly well but anxious to "keep moving" and to go home to see his dog. Continued Rt gaze preference but with max cues he was able to look to Lt visual field to identify numbers on fingers therapist was holding up.        Exercises Other Exercises Other Exercises: supine LLE AAROM: ankle pumps, heel slides, LAQ x10 reps ea Other Exercises: supine RLE AROM: ankle pumps, heel slides x10 reps ea Other Exercises: supine LUE AAROM: elbow flex/ext, shoulder flexion x10 reps ea Other Exercises: *gait and STS billed as TE for BLE strengthening    General Comments General comments (skin integrity, edema, etc.): HR WFL per tele monitor, pt with audible secretions but able to ask for and use oral suction on his own at times.      Pertinent Vitals/Pain Pain Assessment: No/denies pain     PT Goals (current goals can now be found in the care plan section) Acute Rehab PT Goals Patient Stated Goal: To get stronger and walk so I can go home and see my dog. PT Goal Formulation: With patient Time For Goal Achievement: 02/14/21 Progress towards PT goals: Progressing toward goals    Frequency    Min 4X/week      PT  Plan Current plan remains appropriate    Co-evaluation PT/OT/SLP Co-Evaluation/Treatment: Yes Reason for Co-Treatment: Necessary to address cognition/behavior during functional activity;For patient/therapist safety;To address functional/ADL transfers PT goals addressed during session: Mobility/safety with mobility;Balance;Proper use of DME;Strengthening/ROM        AM-PAC PT "6 Clicks" Mobility   Outcome Measure  Help needed turning from your back to your side while in a flat bed without using bedrails?: A Little Help needed moving from lying on your back to sitting on the side of a flat bed without using bedrails?: A Little Help needed moving to and from a bed to a chair (including a wheelchair)?: A Lot Help needed standing up from a chair using your arms (e.g., wheelchair or bedside chair)?: A Lot Help needed to walk in hospital room?: A Lot Help needed climbing 3-5 steps with a railing? : Total 6 Click Score: 13    End of Session Equipment Utilized During Treatment: Gait belt Activity Tolerance: Patient tolerated treatment well Patient left: in chair;with call bell/phone within reach;with chair alarm set Nurse Communication: Mobility status;Need for lift equipment (will do better with Stedy to return pivot with nursing staff vs squat pivot to stronger rt side on return to bed) PT Visit Diagnosis: Unsteadiness on feet (R26.81);Muscle weakness (generalized) (M62.81);Difficulty in walking, not elsewhere classified (R26.2)     Time: 1610-9604 PT Time Calculation (min) (ACUTE ONLY): 33 min  Charges:  $Therapeutic Exercise: 8-22 mins                     Andi Layfield P., PTA Acute Rehabilitation Services Pager: [phone] Office: [place] 02/05/2021, 12:17  PM

## 2021-02-05 NOTE — Progress Notes (Signed)
Inpatient Rehab Admissions Coordinator:   I do not have insurance auth or a bed for this pt. On CIR today. I will follow for potential admit pending insurance auth and bed availability.  Clemens Catholic, [place], [place] Admissions Coordinator  [phone] ([place]) [phone] (office)

## 2021-02-05 NOTE — Plan of Care (Signed)
°  Problem: Education: Goal: Knowledge of disease or condition will improve Outcome: Progressing Goal: Knowledge of secondary prevention will improve (SELECT ALL) Outcome: Progressing Goal: Knowledge of patient specific risk factors will improve (INDIVIDUALIZE FOR PATIENT) Outcome: Progressing Goal: Individualized Educational Video(s) Outcome: Progressing   Problem: Education: Goal: Knowledge of secondary prevention will improve (SELECT ALL) Outcome: Progressing   Problem: Ischemic Stroke/TIA Tissue Perfusion: Goal: Complications of ischemic stroke/TIA will be minimized Outcome: Progressing

## 2021-02-05 NOTE — Progress Notes (Signed)
Occupational Therapy Treatment Patient Details Name: Douglas Spencer MRN: [id number] DOB: 29-Jun-1945 Today's Date: 02/05/2021   History of present illness 75 y.o. male who presented to the ED on 12/20 secondary to right-sided gaze, left-sided weakness and drift, slurred speech. TNK given. 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 EEG 12/21 was WNL. 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   OT comments  [name] is progressing well towards his goals. He transferred from supine>Eob with min A, and transferred from sit<>stand with min A and required mod A to ambulate with L platform walker. He also demonstrated great ability to complete UB dressing with compensatory technique of dependently threading LUE with his RUE and using the dangle method given mod A overall for physical assist and verbal cues. He will continue to benefit from OT acutely. Pt remains extremely motivated to participate, recommend CIR at d/c.   Recommendations for follow up therapy are one component of a multi-disciplinary discharge planning process, led by the attending physician.  Recommendations may be updated based on patient status, additional functional criteria and insurance authorization.    Follow Up Recommendations  Acute inpatient rehab (3hours/day)    Assistance Recommended at Discharge Frequent or constant Supervision/Assistance  Equipment Recommendations  BSC/3in1;Wheelchair (measurements OT);Wheelchair cushion (measurements OT);Tub/shower seat       Precautions / Restrictions Precautions Precautions: Fall Precaution Comments: L inattention Restrictions Weight Bearing Restrictions: No       Mobility Bed Mobility Overal bed mobility: Needs Assistance Bed Mobility: Supine to Sit Rolling: Min assist;+2 for safety/equipment          General bed mobility comments: increased time, pt cued to use RUE to assist with advancing LUE while sitting up with good carryover.    Transfers Overall transfer level: Needs assistance Equipment used: Left platform walker Transfers: Sit to/from Stand;Bed to chair/wheelchair/BSC Sit to Stand: +2 safety/equipment;Mod assist           General transfer comment: pt able to rise with minA but needs up to [place] for steadying within L platform RW and postural cues     Balance Overall balance assessment: Needs assistance Sitting-balance support: Single extremity supported;Feet supported Sitting balance-Leahy Scale: Fair Sitting balance - Comments: cues to maintain midline at times. Close supervision required.   Standing balance support: Single extremity supported Standing balance-Leahy Scale: Poor Standing balance comment: requires cues to maintain midline in standing due to L lateral lean with poor carryover                           ADL either performed or assessed with clinical judgement   ADL Overall ADL's : Needs assistance/impaired                 Upper Body Dressing : Moderate assistance;Sitting Upper Body Dressing Details (indicate cue type and reason): repetition of putting LUE into UB clothing first, max A intially but transitioned on mod A with practice     Toilet Transfer: Moderate assistance;+2 for physical assistance;+2 for safety/equipment;Rolling walker (2 wheels) Toilet Transfer Details (indicate cue type and reason): RW with L forearm extension         Functional mobility during ADLs: Moderate assistance;+2 for safety/equipment;+2 for physical assistance General ADL Comments: pt did well with attending to L environment given verbal cues. ambulated ~28ft with mod A+2  Extremity/Trunk Assessment Upper Extremity Assessment Upper Extremity Assessment: LUE deficits/detail LUE Deficits / Details: no trace activation noted in L hand,  minimal movement in flexion at elbow and shoulder shrug LUE Sensation: WNL LUE Coordination: decreased fine motor;decreased gross motor   Lower Extremity Assessment Lower Extremity Assessment: Defer to PT evaluation        Vision   Vision Assessment?: Yes Eye Alignment: Impaired (comment) Ocular Range of Motion: Restricted on the left Alignment/Gaze Preference: Gaze right Tracking/Visual Pursuits: Requires cues, head turns, or add eye shifts to track;Unable to hold eye position out of midline;Decreased smoothness of vertical tracking Additional Comments: R gaze.   Perception Perception Perception: Impaired   Praxis Praxis Praxis: Not tested    Cognition Arousal/Alertness: Awake/alert Behavior During Therapy: Flat affect Overall Cognitive Status: Impaired/Different from baseline Area of Impairment: Orientation;Memory;Following commands;Safety/judgement;Attention;Problem solving;Awareness                 Orientation Level: Disoriented to;Situation Current Attention Level: Sustained Memory: Decreased short-term memory;Decreased recall of precautions Following Commands: Follows one step commands inconsistently Safety/Judgement: Decreased awareness of safety;Decreased awareness of deficits Awareness: Intellectual Problem Solving: Slow processing;Decreased initiation;Requires verbal cues General Comments: Pt with decreased insight into deficits and situation but appears slightly improved from previous session. Follows commands fairly well but anxious to "keep moving" and to go home to see his dog. Continued Rt gaze preference but with max cues he was able to look to Lt visual field to identify numbers on fingers therapist was holding up.          Exercises Exercises: Other exercises Other Exercises Other Exercises: supine LLE AAROM: ankle pumps, heel slides, LAQ x10 reps ea Other Exercises: supine RLE AROM: ankle pumps, heel slides x10 reps ea Other Exercises: supine  LUE AAROM: elbow flex/ext, shoulder flexion x10 reps ea Other Exercises: *gait and STS billed as TE for BLE strengthening   Shoulder Instructions       General Comments VSS on RA    Pertinent Vitals/ Pain       Pain Assessment: No/denies pain   Frequency  Min 2X/week        Progress Toward Goals  OT Goals(current goals can now be found in the care plan section)  Progress towards OT goals: Progressing toward goals  Acute Rehab OT Goals Patient Stated Goal: home to see dog OT Goal Formulation: Patient unable to participate in goal setting Time For Goal Achievement: 02/14/21 Potential to Achieve Goals: Fair ADL Goals Pt Will Perform Grooming: with set-up;bed level Pt Will Perform Upper Body Bathing: with min assist;sitting Pt Will Perform Upper Body Dressing: with min assist;sitting Pt/caregiver will Perform Home Exercise Program: Increased strength;Left upper extremity;With minimal assist;Increased ROM Additional ADL Goal #1: Patient to sit edge of bed with supervision for up to 5 min to increase independence with UB ADL and toilet transfers  Plan Discharge plan remains appropriate    Co-evaluation      Reason for Co-Treatment: For patient/therapist safety;To address functional/ADL transfers PT goals addressed during session: Mobility/safety with mobility;Balance;Proper use of DME;Strengthening/ROM OT goals addressed during session: ADL's and self-care;Proper use of Adaptive equipment and DME      AM-PAC OT "6 Clicks" Daily Activity     Outcome Measure   Help from another person eating meals?: Total Help from another person taking care of personal grooming?: A Lot Help from another person toileting, which includes using toliet, bedpan, or urinal?: A Lot Help from another person bathing (including washing, rinsing, drying)?: A  Lot Help from another person to put on and taking off regular upper body clothing?: A Lot Help from another person to put on and taking off  regular lower body clothing?: Total 6 Click Score: 10    End of Session Equipment Utilized During Treatment: Gait belt;Rolling walker (2 wheels) (with L extension)  OT Visit Diagnosis: Unsteadiness on feet (R26.81);Muscle weakness (generalized) (M62.81);Other symptoms and signs involving cognitive function;Hemiplegia and hemiparesis Hemiplegia - Right/Left: Left Hemiplegia - dominant/non-dominant: Non-Dominant   Activity Tolerance Patient tolerated treatment well   Patient Left in chair;with call bell/phone within reach;with chair alarm set;with nursing/sitter in room;with family/visitor present   Nurse Communication Mobility status        Time: 1834-3735 OT Time Calculation (min): 33 min  Charges: OT General Charges $OT Visit: 1 Visit OT Treatments $Self Care/Home Management : 8-22 mins   Jacson Rapaport A Lilla Callejo 02/05/2021, 12:31 PM

## 2021-02-05 NOTE — H&P (Signed)
Physical Medicine and Rehabilitation Admission H&P    Chief Complaint  Patient presents with   Code Stroke  : HPI: Douglas Spencer is a 75 year old right-handed male with history of alcohol use/polysubstance and tobacco use as well as seizure disorder noncompliant with Keppra.  Per chart review lives with a friend.  Two-level home one-step to entry.  Reportedly independent prior to admission.  Presented 01/30/2021 with acute onset of left side weakness as well as dysarthric speech.  Cranial CT scan negative for acute changes.  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  CT angiogram of head and neck no large vessel occlusion.  MRI showed acute infarct right lenticulostriate territory.  Moderately large territory involved.  No intracranial hemorrhage.  Neurology follow-up patient consented to tNKASE.  Echocardiogram with ejection fraction of 60 to 65% no wall motion abnormalities.  Admission chemistries were unremarkable except sodium 133, urine drug screen positive for marijuana as well as cocaine.  Currently maintained on aspirin 81 mg  daily and Plavix 75 mg daily for CVA prophylaxis x3 weeks then aspirin alone.  Lovenox for DVT prophylaxis.  EEG showed no signs of seizure patient currently maintained on Vimpat.  Currently n.p.o. with alternative means of nutritional support.  Therapy evaluations completed due to patient decreased functional ability left side weakness was admitted for a comprehensive rehab program. Pt had MBS today and was cleared for D3/nectars. Still has NGT in place  Review of Systems  Constitutional:  Negative for chills and fever.  HENT:  Negative for hearing loss.   Eyes:  Negative for blurred vision and double vision.  Respiratory:  Negative for cough and shortness of breath.   Cardiovascular:  Negative for chest pain, palpitations and leg swelling.  Gastrointestinal:  Positive for constipation. Negative  for heartburn, nausea and vomiting.  Genitourinary:  Negative for dysuria, flank pain and hematuria.  Musculoskeletal:  Positive for joint pain and myalgias.  Skin:  Negative for rash.  Neurological:  Positive for speech change, seizures and weakness.  All other systems reviewed and are negative. No past medical history on file. No past surgical history on file. No family history on file. Social History:  reports that he has been smoking cigarettes. He has been smoking an average of .5 packs per day. He does not have any smokeless tobacco history on file. He reports current alcohol use. He reports current drug use. Drug: Marijuana. Allergies: No Known Allergies Medications Prior to Admission  Medication Sig Dispense Refill   levETIRAcetam (KEPPRA) 500 MG tablet TAKE 1 TABLET (500 MG TOTAL) BY MOUTH TWO TIMES DAILY. (Patient not taking: Reported on 01/31/2021) 60 tablet 0    Drug Regimen Review Drug regimen was reviewed and remains appropriate with no significant issues identified  Home: Home Living Family/patient expects to be discharged to:: Private residence Living Arrangements: Spouse/significant other Available Help at Discharge: Family Type of Home: House Home Access: Stairs to enter Technical brewer of Steps: 1 Home Layout: Two level Alternate Level Stairs-Number of Steps: flight Bathroom Shower/Tub: Multimedia programmer: Standard Bathroom Accessibility: Yes Additional Comments: information limited by patient's lethargy  Lives With: Significant other   Functional History: Prior Function Prior Level of Function : Independent/Modified Independent  Functional Status:  Mobility: Bed Mobility Overal bed mobility: Needs Assistance Bed Mobility: Sit to Sidelying Rolling: Min assist Sidelying to sit: Min assist, +2 for safety/equipment Supine to sit: Mod assist Sit to sidelying: Mod assist General bed mobility comments:  pt instructed on hooking "good" Rt  leg under LLE to assist with return to supine but still needs modA for BLE assist and minA for rolling/repositioning in bed. Transfers Overall transfer level: Needs assistance Equipment used: 1 person hand held assist Transfers: Sit to/from Stand, Bed to chair/wheelchair/BSC Sit to Stand: Mod assist Bed to/from chair/wheelchair/BSC transfer type:: Stand pivot Stand pivot transfers: Mod assist Step pivot transfers: Mod assist, +2 physical assistance Transfer via Lift Equipment: Stedy General transfer comment: from chair via face to face technique pt stands and assisted to pivot back to bed via stronger Rt side Ambulation/Gait Ambulation/Gait assistance: Mod assist, +2 physical assistance Gait Distance (Feet): 10 Feet Assistive device: Left platform walker Gait Pattern/deviations: Step-to pattern, Decreased step length - left, Decreased dorsiflexion - left, Drifts right/left, Narrow base of support General Gait Details: pt needing max cues for step sequencing and L PFRW use and needs manual assist at times to advance RW. Pt with narrow BOS and Lt lean that increases with gait distance.    ADL: ADL Overall ADL's : Needs assistance/impaired Grooming: Bed level, Minimal assistance Upper Body Bathing: Maximal assistance, Bed level Upper Body Dressing : Moderate assistance, Sitting Upper Body Dressing Details (indicate cue type and reason): repetition of putting LUE into UB clothing first, max A intially but transitioned on mod A with practice Lower Body Dressing: Total assistance, Bed level Toilet Transfer: Moderate assistance, +2 for physical assistance, +2 for safety/equipment, Rolling walker (2 wheels) Toilet Transfer Details (indicate cue type and reason): RW with L forearm extension Functional mobility during ADLs: Moderate assistance, +2 for safety/equipment, +2 for physical assistance General ADL Comments: pt did well with attending to L environment given verbal cues. ambulated ~50ft  with mod A+2  Cognition: Cognition Overall Cognitive Status: Impaired/Different from baseline Arousal/Alertness: Lethargic Orientation Level: Oriented X4 Year: 2021 Month: December Day of Week: Correct Attention: Focused, Sustained Focused Attention: Impaired Focused Attention Impairment: Verbal complex Sustained Attention: Impaired Sustained Attention Impairment: Verbal complex Memory: Impaired Memory Impairment: Retrieval deficit, Decreased recall of new information (Immediate: 5/5 with repetition; delayed: 1/5; with cues: 1/4) Awareness: Impaired Awareness Impairment: Intellectual impairment Problem Solving: Impaired Problem Solving Impairment: Verbal complex Executive Function: Sequencing, Organizing Sequencing: Impaired Sequencing Impairment: Verbal complex (Clock drawing: 0/4) Organizing: Impaired Organizing Impairment: Verbal complex (backward digit span: 0/2) Cognition Arousal/Alertness: Awake/alert Behavior During Therapy: Flat affect Overall Cognitive Status: Impaired/Different from baseline Area of Impairment: Orientation, Memory, Following commands, Safety/judgement, Attention, Problem solving, Awareness Orientation Level: Disoriented to, Situation Current Attention Level: Sustained Memory: Decreased short-term memory, Decreased recall of precautions Following Commands: Follows one step commands inconsistently Safety/Judgement: Decreased awareness of safety, Decreased awareness of deficits Awareness: Intellectual Problem Solving: Slow processing, Decreased initiation, Requires verbal cues General Comments: Continued Rt gaze preference but with max cues he was able to look to Lt visual field. Pt had been up in chair >5 hours but was reluctant to get back to bed. Difficult to assess due to: Level of arousal  Physical Exam: Blood pressure 133/82, pulse 70, temperature 97.8 F (36.6 C), temperature source Oral, resp. rate 20, height 5\' 8"  (1.727 m), weight 59.8 kg,  SpO2 95 %. Physical Exam Constitutional:      Appearance: He is ill-appearing.  HENT:     Head: Normocephalic.     Comments: NGT in place    Right Ear: External ear normal.     Left Ear: External ear normal.     Nose: Nose normal.     Mouth/Throat:  Mouth: Mucous membranes are moist.     Pharynx: Oropharynx is clear.  Eyes:     General: No scleral icterus. Cardiovascular:     Rate and Rhythm: Normal rate and regular rhythm.     Heart sounds: No murmur heard.   No gallop.  Pulmonary:     Effort: Pulmonary effort is normal. No respiratory distress.     Breath sounds: No wheezing.  Abdominal:     General: Bowel sounds are normal. There is no distension.     Palpations: Abdomen is soft.     Tenderness: There is no abdominal tenderness.  Musculoskeletal:        General: No swelling or tenderness. Normal range of motion.     Cervical back: Normal range of motion.  Skin:    Comments: A few scattered abrasions and eccymoses.  Neurological:     Mental Status: He is alert.     Comments: Alert. Fair insight and awareness. Functional memory. Speech dysarthric. Right gaze preference. No focal field cuts. Left central 7 and mild left tongue deviation. LUE 1+ pecs, 2+ biceps, tr finger flexion. LLE 4/5 HF, KE and ADF/PF. Slightly decreased LT on left arm>leg but can sense pain in both. No resting tone. DTR's tr  Psychiatric:        Mood and Affect: Mood normal.        Behavior: Behavior normal.    Results for orders placed or performed during the hospital encounter of 01/30/21 (from the past 48 hour(s))  Basic metabolic panel     Status: Abnormal   Collection Time: 02/04/21  6:50 AM  Result Value Ref Range   Sodium 139 135 - 145 mmol/L   Potassium 3.6 3.5 - 5.1 mmol/L   Chloride 111 98 - 111 mmol/L   CO2 22 22 - 32 mmol/L   Glucose, Bld 122 (H) 70 - 99 mg/dL    Comment: Glucose reference range applies only to samples taken after fasting for at least 8 hours.   BUN 15 8 - 23 mg/dL    Creatinine, Ser 0.83 0.61 - 1.24 mg/dL   Calcium 8.2 (L) 8.9 - 10.3 mg/dL   GFR, Estimated >60 >60 mL/min    Comment: (NOTE) Calculated using the CKD-EPI Creatinine Equation (2021)    Anion gap 6 5 - 15    Comment: Performed at [facility] [address], [place], [place] [address]  CBC     Status: None   Collection Time: 02/04/21  6:50 AM  Result Value Ref Range   WBC 6.7 4.0 - 10.5 K/uL   RBC 4.90 4.22 - 5.81 MIL/uL   Hemoglobin 14.6 13.0 - 17.0 g/dL   HCT 42.0 39.0 - 52.0 %   MCV 85.7 80.0 - 100.0 fL   MCH 29.8 26.0 - 34.0 pg   MCHC 34.8 30.0 - 36.0 g/dL   RDW 13.2 11.5 - 15.5 %   Platelets 185 150 - 400 K/uL   nRBC 0.0 0.0 - 0.2 %    Comment: Performed at [facility], [place] [address], [place], [place] [address]  Magnesium     Status: None   Collection Time: 02/04/21  6:50 AM  Result Value Ref Range   Magnesium 1.9 1.7 - 2.4 mg/dL    Comment: Performed at [facility] [address] [place], [place] [address]  Phosphorus     Status: None   Collection Time: 02/04/21  6:50 AM  Result Value Ref Range  Phosphorus 2.5 2.5 - 4.6 mg/dL    Comment: Performed at [facility], [place] [address] [place], [place] [address]  Glucose, capillary     Status: Abnormal   Collection Time: 02/04/21  7:55 AM  Result Value Ref Range   Glucose-Capillary 112 (H) 70 - 99 mg/dL    Comment: Glucose reference range applies only to samples taken after fasting for at least 8 hours.   Comment 1 Notify RN    Comment 2 Document in Chart   Glucose, capillary     Status: Abnormal   Collection Time: 02/04/21  1:11 PM  Result Value Ref Range   Glucose-Capillary 111 (H) 70 - 99 mg/dL    Comment: Glucose reference range applies only to samples taken after fasting for at least 8 hours.   Comment 1 Notify RN    Comment 2 Document in Chart   Glucose, capillary     Status: Abnormal   Collection Time: 02/04/21  4:07 PM  Result Value Ref Range   Glucose-Capillary 104 (H) 70 - 99  mg/dL    Comment: Glucose reference range applies only to samples taken after fasting for at least 8 hours.   Comment 1 Notify RN    Comment 2 Document in Chart   Magnesium     Status: None   Collection Time: 02/04/21  4:09 PM  Result Value Ref Range   Magnesium 2.0 1.7 - 2.4 mg/dL    Comment: Performed at [facility], [place] [address] [place], [place] [address]  Phosphorus     Status: None   Collection Time: 02/04/21  4:09 PM  Result Value Ref Range   Phosphorus 3.0 2.5 - 4.6 mg/dL    Comment: Performed at [facility] [address], [place], [place] [address]  Glucose, capillary     Status: Abnormal   Collection Time: 02/04/21  7:59 PM  Result Value Ref Range   Glucose-Capillary 111 (H) 70 - 99 mg/dL    Comment: Glucose reference range applies only to samples taken after fasting for at least 8 hours.  Glucose, capillary     Status: None   Collection Time: 02/04/21 11:55 PM  Result Value Ref Range   Glucose-Capillary 99 70 - 99 mg/dL    Comment: Glucose reference range applies only to samples taken after fasting for at least 8 hours.  Glucose, capillary     Status: Abnormal   Collection Time: 02/05/21  3:47 AM  Result Value Ref Range   Glucose-Capillary 118 (H) 70 - 99 mg/dL    Comment: Glucose reference range applies only to samples taken after fasting for at least 8 hours.  Basic metabolic panel     Status: Abnormal   Collection Time: 02/05/21  5:14 AM  Result Value Ref Range   Sodium 139 135 - 145 mmol/L   Potassium 3.6 3.5 - 5.1 mmol/L   Chloride 110 98 - 111 mmol/L   CO2 22 22 - 32 mmol/L   Glucose, Bld 123 (H) 70 - 99 mg/dL    Comment: Glucose reference range applies only to samples taken after fasting for at least 8 hours.   BUN 17 8 - 23 mg/dL   Creatinine, Ser 0.75 0.61 - 1.24 mg/dL   Calcium 8.1 (L) 8.9 - 10.3 mg/dL   GFR, Estimated >60 >60 mL/min    Comment: (NOTE) Calculated using the CKD-EPI Creatinine Equation (2021)    Anion gap 7 5 - 15  Comment: Performed at [facility], [place] [address], [place] [address]  CBC     Status: None   Collection Time: 02/05/21  5:14 AM  Result Value Ref Range   WBC 6.9 4.0 - 10.5 K/uL   RBC 4.86 4.22 - 5.81 MIL/uL   Hemoglobin 14.6 13.0 - 17.0 g/dL   HCT 42.5 39.0 - 52.0 %   MCV 87.4 80.0 - 100.0 fL   MCH 30.0 26.0 - 34.0 pg   MCHC 34.4 30.0 - 36.0 g/dL   RDW 13.4 11.5 - 15.5 %   Platelets 168 150 - 400 K/uL   nRBC 0.0 0.0 - 0.2 %    Comment: Performed at [facility], [place] [address], [place], [place] [address]  Magnesium     Status: None   Collection Time: 02/05/21  5:14 AM  Result Value Ref Range   Magnesium 1.9 1.7 - 2.4 mg/dL    Comment: Performed at [facility] [address] [place], [place] [address]  Phosphorus     Status: None   Collection Time: 02/05/21  5:14 AM  Result Value Ref Range   Phosphorus 3.2 2.5 - 4.6 mg/dL    Comment: Performed at [facility] [address] [place], [place] [address]  Glucose, capillary     Status: Abnormal   Collection Time: 02/05/21  8:05 AM  Result Value Ref Range   Glucose-Capillary 125 (H) 70 - 99 mg/dL    Comment: Glucose reference range applies only to samples taken after fasting for at least 8 hours.  Glucose, capillary     Status: None   Collection Time: 02/05/21 11:17 AM  Result Value Ref Range   Glucose-Capillary 93 70 - 99 mg/dL    Comment: Glucose reference range applies only to samples taken after fasting for at least 8 hours.  Glucose, capillary     Status: None   Collection Time: 02/05/21  5:25 PM  Result Value Ref Range   Glucose-Capillary 85 70 - 99 mg/dL    Comment: Glucose reference range applies only to samples taken after fasting for at least 8 hours.  Glucose, capillary     Status: None   Collection Time: 02/05/21  7:36 PM  Result Value Ref Range   Glucose-Capillary 94 70 - 99 mg/dL    Comment: Glucose reference range applies only to samples taken after fasting for at least 8  hours.  Glucose, capillary     Status: None   Collection Time: 02/05/21 11:57 PM  Result Value Ref Range   Glucose-Capillary 97 70 - 99 mg/dL    Comment: Glucose reference range applies only to samples taken after fasting for at least 8 hours.  Basic metabolic panel     Status: Abnormal   Collection Time: 02/06/21  1:03 AM  Result Value Ref Range   Sodium 139 135 - 145 mmol/L   Potassium 3.9 3.5 - 5.1 mmol/L   Chloride 111 98 - 111 mmol/L   CO2 22 22 - 32 mmol/L   Glucose, Bld 109 (H) 70 - 99 mg/dL    Comment: Glucose reference range applies only to samples taken after fasting for at least 8 hours.   BUN 16 8 - 23 mg/dL   Creatinine, Ser 0.80 0.61 - 1.24 mg/dL   Calcium 8.7 (L) 8.9 - 10.3 mg/dL   GFR, Estimated >60 >60 mL/min    Comment: (NOTE) Calculated using the CKD-EPI Creatinine Equation (2021)  Anion gap 6 5 - 15    Comment: Performed at [facility] [address], [place] [address]  CBC     Status: None   Collection Time: 02/06/21  1:03 AM  Result Value Ref Range   WBC 7.3 4.0 - 10.5 K/uL   RBC 4.93 4.22 - 5.81 MIL/uL   Hemoglobin 14.9 13.0 - 17.0 g/dL   HCT 42.8 39.0 - 52.0 %   MCV 86.8 80.0 - 100.0 fL   MCH 30.2 26.0 - 34.0 pg   MCHC 34.8 30.0 - 36.0 g/dL   RDW 13.4 11.5 - 15.5 %   Platelets 171 150 - 400 K/uL   nRBC 0.0 0.0 - 0.2 %    Comment: Performed at [facility], [place] [address], [place], [place] [address]  Glucose, capillary     Status: Abnormal   Collection Time: 02/06/21  3:28 AM  Result Value Ref Range   Glucose-Capillary 116 (H) 70 - 99 mg/dL    Comment: Glucose reference range applies only to samples taken after fasting for at least 8 hours.   No results found.     Medical Problem List and Plan: 1.  Left-sided weakness with dysarthria functional deficits secondary to right lacunar basal ganglia infarct  -patient may shower  -ELOS/Goals: 12-14 days, goals min assist with PT/OT/SLP 2.   Antithrombotics: -DVT/anticoagulation:  Pharmaceutical: Lovenox  -antiplatelet therapy: Aspirin 81 mg daily and Plavix 75 mg daily x3 weeks and aspirin alone 3. Pain Management: Tylenol as needed 4. Mood: Provide emotional support  -antipsychotic agents: N/A 5. Neuropsych: This patient is capable of making decisions on his own behalf. 6. Skin/Wound Care: Routine skin checks 7. Fluids/Electrolytes/Nutrition: Routine in and outs with follow-up chemistries 8.  Dysphagia.  Pt passed MBS today for D3/nectars  -will keep NGT in today. If intake is satisfactory will dc NGT tomorrow 9.  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  Continue Vimpat 10.  History of alcohol tobacco polysubstance use.  Urine drug screen positive cocaine as well as marijuana.  Provide counseling 11.  Hyperlipidemia.  Lipitor      Cathlyn Parsons, PA-C 02/06/2021

## 2021-02-05 NOTE — Care Management Important Message (Signed)
Important Message  Patient Details  Name: Douglas Spencer MRN: [id number] Date of Birth: 10-07-1945   Medicare Important Message Given:  Yes     Hannah Beat 02/05/2021, 1:05 PM

## 2021-02-06 ENCOUNTER — Inpatient Hospital Stay (HOSPITAL_COMMUNITY): Payer: Medicare HMO

## 2021-02-06 ENCOUNTER — Other Ambulatory Visit: Payer: Self-pay

## 2021-02-06 ENCOUNTER — Encounter (HOSPITAL_COMMUNITY): Payer: Self-pay | Admitting: Physical Medicine & Rehabilitation

## 2021-02-06 ENCOUNTER — Inpatient Hospital Stay (HOSPITAL_COMMUNITY)
Admission: RE | Admit: 2021-02-06 | Discharge: 2021-02-21 | DRG: 057 | Disposition: A | Discharge status: Home / Self Care | Payer: Medicare HMO | Source: Intra-hospital | Attending: Physical Medicine & Rehabilitation | Admitting: Physical Medicine & Rehabilitation

## 2021-02-06 ENCOUNTER — Other Ambulatory Visit (HOSPITAL_COMMUNITY): Payer: Self-pay

## 2021-02-06 DIAGNOSIS — Z87898 Personal history of other specified conditions: Secondary | ICD-10-CM

## 2021-02-06 DIAGNOSIS — Z79899 Other long term (current) drug therapy: Secondary | ICD-10-CM

## 2021-02-06 DIAGNOSIS — I63311 Cerebral infarction due to thrombosis of right middle cerebral artery: Secondary | ICD-10-CM | POA: Diagnosis not present

## 2021-02-06 DIAGNOSIS — I639 Cerebral infarction, unspecified: Secondary | ICD-10-CM | POA: Diagnosis present

## 2021-02-06 DIAGNOSIS — I69322 Dysarthria following cerebral infarction: Secondary | ICD-10-CM | POA: Diagnosis not present

## 2021-02-06 DIAGNOSIS — Z8673 Personal history of transient ischemic attack (TIA), and cerebral infarction without residual deficits: Secondary | ICD-10-CM | POA: Diagnosis not present

## 2021-02-06 DIAGNOSIS — Z9114 Patient's other noncompliance with medication regimen: Secondary | ICD-10-CM | POA: Diagnosis not present

## 2021-02-06 DIAGNOSIS — I69391 Dysphagia following cerebral infarction: Secondary | ICD-10-CM

## 2021-02-06 DIAGNOSIS — Z716 Tobacco abuse counseling: Secondary | ICD-10-CM | POA: Diagnosis not present

## 2021-02-06 DIAGNOSIS — E785 Hyperlipidemia, unspecified: Secondary | ICD-10-CM | POA: Diagnosis present

## 2021-02-06 DIAGNOSIS — G40909 Epilepsy, unspecified, not intractable, without status epilepticus: Secondary | ICD-10-CM | POA: Diagnosis not present

## 2021-02-06 DIAGNOSIS — R1312 Dysphagia, oropharyngeal phase: Secondary | ICD-10-CM | POA: Diagnosis present

## 2021-02-06 DIAGNOSIS — R053 Chronic cough: Secondary | ICD-10-CM | POA: Diagnosis present

## 2021-02-06 DIAGNOSIS — F1910 Other psychoactive substance abuse, uncomplicated: Secondary | ICD-10-CM

## 2021-02-06 DIAGNOSIS — R69 Illness, unspecified: Secondary | ICD-10-CM | POA: Diagnosis not present

## 2021-02-06 DIAGNOSIS — L988 Other specified disorders of the skin and subcutaneous tissue: Secondary | ICD-10-CM | POA: Diagnosis present

## 2021-02-06 DIAGNOSIS — F17210 Nicotine dependence, cigarettes, uncomplicated: Secondary | ICD-10-CM | POA: Diagnosis present

## 2021-02-06 DIAGNOSIS — E43 Unspecified severe protein-calorie malnutrition: Secondary | ICD-10-CM

## 2021-02-06 DIAGNOSIS — K5900 Constipation, unspecified: Secondary | ICD-10-CM | POA: Diagnosis present

## 2021-02-06 DIAGNOSIS — E7800 Pure hypercholesterolemia, unspecified: Secondary | ICD-10-CM | POA: Diagnosis not present

## 2021-02-06 DIAGNOSIS — I10 Essential (primary) hypertension: Secondary | ICD-10-CM | POA: Diagnosis not present

## 2021-02-06 DIAGNOSIS — I69354 Hemiplegia and hemiparesis following cerebral infarction affecting left non-dominant side: Secondary | ICD-10-CM | POA: Diagnosis not present

## 2021-02-06 HISTORY — DX: Alcohol abuse, uncomplicated: F10.10

## 2021-02-06 HISTORY — DX: Other psychoactive substance abuse, uncomplicated: F19.10

## 2021-02-06 LAB — BASIC METABOLIC PANEL
Anion gap: 6 (ref 5–15)
BUN: 16 mg/dL (ref 8–23)
CO2: 22 mmol/L (ref 22–32)
Calcium: 8.7 mg/dL — ABNORMAL LOW (ref 8.9–10.3)
Chloride: 111 mmol/L (ref 98–111)
Creatinine, Ser: 0.8 mg/dL (ref 0.61–1.24)
GFR, Estimated: >60 mL/min (ref >60)
Glucose, Bld: 109 mg/dL — ABNORMAL HIGH (ref 70–99)
Potassium: 3.9 mmol/L (ref 3.5–5.1)
Sodium: 139 mmol/L (ref 135–145)

## 2021-02-06 LAB — GLUCOSE, CAPILLARY
Glucose-Capillary: 116 mg/dL — ABNORMAL HIGH (ref 70–99)
Glucose-Capillary: 116 mg/dL — ABNORMAL HIGH (ref 70–99)
Glucose-Capillary: 109 mg/dL — ABNORMAL HIGH (ref 70–99)
Glucose-Capillary: 103 mg/dL — ABNORMAL HIGH (ref 70–99)

## 2021-02-06 LAB — CBC
HCT: 42.8 % (ref 39.0–52.0)
Hemoglobin: 14.9 g/dL (ref 13.0–17.0)
MCH: 30.2 pg (ref 26.0–34.0)
MCHC: 34.8 g/dL (ref 30.0–36.0)
MCV: 86.8 fL (ref 80.0–100.0)
Platelets: 171 10*3/uL (ref 150–400)
RBC: 4.93 MIL/uL (ref 4.22–5.81)
RDW: 13.4 % (ref 11.5–15.5)
WBC: 7.3 10*3/uL (ref 4.0–10.5)
nRBC: 0 % (ref 0.0–0.2)

## 2021-02-06 MED ORDER — ACETAMINOPHEN 650 MG RE SUPP: 650.0000 mg | RECTAL | Status: DC | PRN

## 2021-02-06 MED ORDER — LACOSAMIDE 50 MG PO TABS
50.0000 mg | ORAL_TABLET | Freq: Two times a day (BID) | ORAL | Status: DC
  Administered 2021-02-06 – 2021-02-13 (×14): 50 mg
  Filled 2021-02-06 (×14): qty 1

## 2021-02-06 MED ORDER — LACOSAMIDE 50 MG PO TABS
50.0000 mg | ORAL_TABLET | Freq: Two times a day (BID) | ORAL | Status: DC
Start: 2021-02-06 — End: 2021-02-21

## 2021-02-06 MED ORDER — CLOPIDOGREL BISULFATE 75 MG PO TABS: 75.0000 mg | ORAL_TABLET | Freq: Every day | ORAL | Status: DC

## 2021-02-06 MED ORDER — ATORVASTATIN CALCIUM 40 MG PO TABS
40.0000 mg | ORAL_TABLET | Freq: Every day | ORAL | Status: DC
  Administered 2021-02-07 – 2021-02-13 (×7): 40 mg
  Filled 2021-02-06 (×7): qty 1

## 2021-02-06 MED ORDER — ACETAMINOPHEN 325 MG PO TABS: 650.0000 mg | ORAL_TABLET | ORAL | Status: DC | PRN

## 2021-02-06 MED ORDER — ENOXAPARIN SODIUM 40 MG/0.4ML IJ SOSY: 40.0000 mg | PREFILLED_SYRINGE | INTRAMUSCULAR | Status: DC

## 2021-02-06 MED ORDER — ACETAMINOPHEN 160 MG/5ML PO SOLN: 650.0000 mg | ORAL | Status: DC | PRN

## 2021-02-06 MED ORDER — PROSOURCE TF PO LIQD: 45.0000 mL | Freq: Every day | ORAL | Status: DC

## 2021-02-06 MED ORDER — ATORVASTATIN CALCIUM 40 MG PO TABS: 40.0000 mg | ORAL_TABLET | Freq: Every day | ORAL | Status: DC

## 2021-02-06 MED ORDER — SENNOSIDES-DOCUSATE SODIUM 8.6-50 MG PO TABS: 1.0000 | ORAL_TABLET | Freq: Every evening | ORAL | Status: DC | PRN

## 2021-02-06 MED ORDER — HYDROXYZINE HCL 25 MG PO TABS
25.0000 mg | ORAL_TABLET | Freq: Four times a day (QID) | ORAL | Status: DC | PRN
  Administered 2021-02-07 – 2021-02-12 (×6): 25 mg
  Filled 2021-02-06 (×6): qty 1

## 2021-02-06 MED ORDER — ASPIRIN 81 MG PO CHEW
81.0000 mg | CHEWABLE_TABLET | Freq: Every day | ORAL | Status: DC
  Administered 2021-02-07 – 2021-02-13 (×7): 81 mg
  Filled 2021-02-06 (×7): qty 1

## 2021-02-06 MED ORDER — OSMOLITE 1.5 CAL PO LIQD
1000.0000 mL | ORAL | Status: DC
  Administered 2021-02-06: 17:00:00 1000 mL

## 2021-02-06 MED ORDER — CLOPIDOGREL BISULFATE 75 MG PO TABS
75.0000 mg | ORAL_TABLET | Freq: Every day | ORAL | Status: DC
  Administered 2021-02-07 – 2021-02-13 (×7): 75 mg
  Filled 2021-02-06 (×7): qty 1

## 2021-02-06 MED ORDER — ENOXAPARIN SODIUM 40 MG/0.4ML IJ SOSY
40.0000 mg | PREFILLED_SYRINGE | Freq: Every day | INTRAMUSCULAR | Status: DC
  Administered 2021-02-06 – 2021-02-20 (×15): 40 mg via SUBCUTANEOUS
  Filled 2021-02-06 (×15): qty 0.4

## 2021-02-06 MED ORDER — PANTOPRAZOLE 2 MG/ML SUSPENSION
40.0000 mg | Freq: Every day | ORAL | Status: DC
  Administered 2021-02-07 – 2021-02-13 (×7): 40 mg
  Filled 2021-02-06 (×2): qty 20

## 2021-02-06 MED ORDER — PROSOURCE TF PO LIQD
45.0000 mL | Freq: Every day | ORAL | Status: DC
  Administered 2021-02-07: 10:00:00 45 mL
  Filled 2021-02-06: qty 45

## 2021-02-06 MED ORDER — AMLODIPINE BESYLATE 10 MG PO TABS: 10.0000 mg | ORAL_TABLET | Freq: Every day | ORAL | Status: DC

## 2021-02-06 MED ORDER — ASPIRIN 81 MG PO CHEW: 81.0000 mg | CHEWABLE_TABLET | Freq: Every day | ORAL | Status: DC

## 2021-02-06 MED ORDER — AMLODIPINE BESYLATE 10 MG PO TABS
10.0000 mg | ORAL_TABLET | Freq: Every day | ORAL | Status: DC
  Administered 2021-02-06: 15:00:00 10 mg via ORAL
  Filled 2021-02-06: qty 1

## 2021-02-06 NOTE — Discharge Instructions (Addendum)
Inpatient Rehab Discharge Instructions  Gerado Nabers Discharge date and time: No discharge date for patient encounter.   Activities/Precautions/ Functional Status: Activity: As tolerated Diet: Dysphagia #2 nectar liquids Wound Care: Routine skin checks Functional status:  ___ No restrictions     ___ Walk up steps independently ___ 24/7 supervision/assistance   ___ Walk up steps with assistance ___ Intermittent supervision/assistance  ___ Bathe/dress independently ___ Walk with walker     _x__ Bathe/dress with assistance ___ Walk Independently    ___ Shower independently ___ Walk with assistance    ___ Shower with assistance ___ No alcohol     ___ Return to work/school ________   COMMUNITY REFERRALS UPON DISCHARGE:    Outpatient: PT       OT     ST                 Agency:[facility]     ZMOQH:[phone]              Appointment Date/Time:*Please expect follow-up within 7-10 business days to schedule your appointment. If you have not received follow-up, be sure to contact the site directly.*   GENERAL COMMUNITY RESOURCES FOR PATIENT/FAMILY: 1) You have been set up for [employer] [phone]. This service can be used for future appointments with a [facility] provider. Please be sure to schedule within 2-3 business days to ensure transportation. A companion/caregiver may accompany you for these trips.   2) Be mindful that you can also use Medicaid transportation by setting up services with [employer] [phone] if needed.   Special Instructions:  No driving smoking or alcohol    My questions have been answered and I understand these instructions. I will adhere to these goals and the provided educational materials after my discharge from the hospital.  Patient/Caregiver Signature _______________________________ Date __________  Clinician Signature _______________________________________ Date __________  Please bring this form and your  medication list with you to all your follow-up doctor's appointments.  STROKE/TIA DISCHARGE INSTRUCTIONS SMOKING Cigarette smoking nearly doubles your risk of having a stroke & is the single most alterable risk factor  If you smoke or have smoked in the last 12 months, you are advised to quit smoking for your health. Most of the excess cardiovascular risk related to smoking disappears within a year of stopping. Ask you doctor about anti-smoking medications [place] Quit Line: 1-800-QUIT NOW Free Smoking Cessation Classes [phone]  CHOLESTEROL Know your levels; limit fat & cholesterol in your diet  Lipid Panel     Component Value Date/Time   CHOL 152 01/31/2021 0105   TRIG 61 01/31/2021 0105   HDL 36 (L) 01/31/2021 0105   CHOLHDL 4.2 01/31/2021 0105   VLDL 12 01/31/2021 0105   LDLCALC 104 (H) 01/31/2021 0105     Many patients benefit from treatment even if their cholesterol is at goal. Goal: Total Cholesterol (CHOL) less than 160 Goal:  Triglycerides (TRIG) less than 150 Goal:  HDL greater than 40 Goal:  LDL (LDLCALC) less than 100   BLOOD PRESSURE American Stroke Association blood pressure target is less that 120/80 mm/Hg  Your discharge blood pressure is:  BP: (!) 143/82 Monitor your blood pressure Limit your salt and alcohol intake Many individuals will require more than one medication for high blood pressure  DIABETES (A1c is a blood sugar average for last 3 months) Goal HGBA1c is under 7% (HBGA1c is blood sugar average for last 3 months)  Diabetes: No known diagnosis of diabetes  Lab Results  Component Value Date   HGBA1C 5.3 01/31/2021    Your HGBA1c can be lowered with medications, healthy diet, and exercise. Check your blood sugar as directed by your physician Call your physician if you experience unexplained or low blood sugars.  PHYSICAL ACTIVITY/REHABILITATION Goal is 30 minutes at least 4 days per week  Activity: Increase activity slowly, Therapies: Physical  Therapy: Home Health Return to work:  Activity decreases your risk of heart attack and stroke and makes your heart stronger.  It helps control your weight and blood pressure; helps you relax and can improve your mood. Participate in a regular exercise program. Talk with your doctor about the best form of exercise for you (dancing, walking, swimming, cycling).  DIET/WEIGHT Goal is to maintain a healthy weight  Your discharge diet is:  Diet Order             DIET DYS 3 Room service appropriate? Yes; Fluid consistency: Nectar Thick  Diet effective now                   liquids Your height is:  Height: 5\' 8"  (172.7 cm) Your current weight is: Weight: 59.8 kg Your Body Mass Index (BMI) is:  BMI (Calculated): 20.05 Following the type of diet specifically designed for you will help prevent another stroke. Your goal weight range is:   Your goal Body Mass Index (BMI) is 19-24. Healthy food habits can help reduce 3 risk factors for stroke:  High cholesterol, hypertension, and excess weight.  RESOURCES Stroke/Support Group:  Call [phone]   STROKE EDUCATION PROVIDED/REVIEWED AND GIVEN TO PATIENT Stroke warning signs and symptoms How to activate emergency medical system (call 911). Medications prescribed at discharge. Need for follow-up after discharge. Personal risk factors for stroke. Pneumonia vaccine given: No Flu vaccine given: No My questions have been answered, the writing is legible, and I understand these instructions.  I will adhere to these goals & educational materials that have been provided to me after my discharge from the hospital.

## 2021-02-06 NOTE — Progress Notes (Addendum)
PMR Admission Coordinator Pre-Admission Assessment   Patient: Douglas Spencer is an 75 y.o., male MRN: [id number] DOB: 08-31-1945 Height: 5\' 8"  (172.7 cm) Weight: 59.8 kg   Insurance Information HMO: ye    PPO:      PCP:      IPA:      80/20:      OTHER:  PRIMARY: Ricard Dillon       Policy#: [id number]      Subscriber: Pt. CM Name: Dewitt Rota      Phone#: [phone]     Fax#: [phone] Pre-Cert#: [id number]      Employer:  Benefits:  Phone #:      Name:  Irene Shipper Date: 02/12/2020 - still active  Deductible: $233 ($233 met)  OOP Max: $0  CIR: $1,556 copay/admission  SNF: SNF: $0/day co-pay for days 1-20, $194.50/day co-pay for days 21-100; limited to 100 days/cal yr  Outpatient:  80% coverage; 20% co-insurance  Home Health:  100% coverage  DME: 80% coverage; 20% co-insurance SECONDARY:  Providers: in network   Secondary:  Medicaid [employer]. Policy # [id number]   Financial Counselor:       Phone#:    The Data Collection Information Summary for patients in Inpatient Rehabilitation Facilities with attached Privacy Act [place] Records was provided and verbally reviewed with: Patient   Emergency Contact Information Contact Information       Name Relation Home Work Mobile    [employer] Friend     [phone]           Current Medical History  Patient Admitting Diagnosis: CVA History of Present Illness: Douglas Spencer is a 75 year old right-handed male with history of alcohol use/polysubstance and tobacco use as well as seizure disorder noncompliant with Keppra.  Per chart review lives with a friend.  Two-level home one-step to entry.  Reportedly independent prior to admission.  Presented 01/30/2021 with acute onset of left side weakness as well as dysarthric speech.  Cranial CT scan negative for acute changes.  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  CT angiogram of head and neck no  large vessel occlusion.  MRI showed acute infarct right lenticulostriate territory.  Moderately large territory involved.  No intracranial hemorrhage.  Neurology follow-up patient consented to tNKASE.  Echocardiogram with ejection fraction of 60 to 65% no wall motion abnormalities.  Admission chemistries were unremarkable except sodium 133, urine drug screen positive for marijuana as well as cocaine.  Currently maintained on aspirin 81 mg  daily and Plavix 75 mg daily for CVA prophylaxis x3 weeks then aspirin alone.  Lovenox for DVT prophylaxis.  EEG showed no signs of seizure patient currently maintained on Vimpat.  Currently n.p.o. with alternative means of nutritional support.  Therapy evaluations completed due to patient decreased functional ability left side weakness was admitted for a comprehensive rehab program Complete NIHSS TOTAL: 8   Patient's medical record from [facility] has been reviewed by the rehabilitation admission coordinator and physician.   Past Medical History  No past medical history on file.   Has the patient had major surgery during 100 days prior to admission? No   Family History   family history is not on file.   Current Medications   Current Facility-Administered Medications:    0.9 %  sodium chloride infusion, , Intravenous, Continuous, Rosalin Hawking, MD, Last Rate: 30 mL/hr at 02/05/21 2136, Restarted at 02/05/21 2136   acetaminophen (TYLENOL) tablet 650 mg, 650 mg, Oral,  Q4H PRN **OR** acetaminophen (TYLENOL) 160 MG/5ML solution 650 mg, 650 mg, Per Tube, Q4H PRN **OR** acetaminophen (TYLENOL) suppository 650 mg, 650 mg, Rectal, Q4H PRN, Donnetta Simpers, MD   aspirin chewable tablet 81 mg, 81 mg, Per Tube, Daily, Rosalin Hawking, MD, 81 mg at 02/06/21 1111   atorvastatin (LIPITOR) tablet 40 mg, 40 mg, Per Tube, Daily, Rosalin Hawking, MD, 40 mg at 02/06/21 1111   chlorhexidine (PERIDEX) 0.12 % solution 15 mL, 15 mL, Mouth Rinse, BID, Palikh, Gaurang M, MD,  15 mL at 02/06/21 1111   Chlorhexidine Gluconate Cloth 2 % PADS 6 each, 6 each, Topical, Daily, Donnetta Simpers, MD, 6 each at 02/05/21 1019   clopidogrel (PLAVIX) tablet 75 mg, 75 mg, Per Tube, Daily, Rosalin Hawking, MD, 75 mg at 02/06/21 1111   enoxaparin (LOVENOX) injection 40 mg, 40 mg, Subcutaneous, QHS, Khaliqdina, Alferd Patee, MD, 40 mg at 02/05/21 2136   feeding supplement (OSMOLITE 1.5 CAL) liquid 1,000 mL, 1,000 mL, Per Tube, Continuous, de Yolanda Manges, Cortney E, NP, Last Rate: 55 mL/hr at 02/05/21 1223, 1,000 mL at 02/05/21 1223   feeding supplement (PROSource TF) liquid 45 mL, 45 mL, Per Tube, Daily, de Yolanda Manges, [place] E, NP, 45 mL at 02/06/21 1110   hydrOXYzine (ATARAX) tablet 25 mg, 25 mg, Per Tube, Q6H PRN, de Yolanda Manges, Cortney E, NP, 25 mg at 02/05/21 2156   lacosamide (VIMPAT) tablet 50 mg, 50 mg, Per Tube, BID, Rosalin Hawking, MD, 50 mg at 02/06/21 1111   MEDLINE mouth rinse, 15 mL, Mouth Rinse, q12n4p, Palikh, Gaurang M, MD, 15 mL at 02/06/21 1112   pantoprazole sodium (PROTONIX) 40 mg/20 mL oral suspension 40 mg, 40 mg, Per Tube, Daily, de Yolanda Manges, [place]og[place] E, NP, 40 mg at 02/06/21 1111   senna-docusate (Senokot-S) tablet 1 tablet, 1 tablet, Per Tube, QHS PRN, de Yolanda Manges, Cortney E, NP   sodium chloride flush (NS) 0.9 % injection 3 mL, 3 mL, Intravenous, Once, Kommor, [place], MD   Patients Current Diet:  Diet Order                  Diet vegetarian Room service appropriate? Yes; Fluid consistency: Nectar Thick  Diet effective now                         Precautions / Restrictions Precautions Precautions: Fall Precaution Comments: L inattention Restrictions Weight Bearing Restrictions: No    Has the patient had 2 or more falls or a fall with injury in the past year? No   Prior Activity Level Community (5-7x/wk): Pt. went out daily   Prior Functional Level Self Care: Did the patient need help bathing, dressing, using the toilet or eating? Independent   Indoor  Mobility: Did the patient need assistance with walking from room to room (with or without device)? Independent   Stairs: Did the patient need assistance with internal or external stairs (with or without device)? Independent   Functional Cognition: Did the patient need help planning regular tasks such as shopping or remembering to take medications? Independent   Patient Information Are you of Hispanic, Latino/a,or Spanish origin?: A. No, not of Hispanic, Latino/a, or Spanish origin What is your race?: A. White Do you need or want an interpreter to communicate with a doctor or health care staff?: 0. No   Patient's Response To:  Health Literacy and Transportation Is the patient able to respond to health literacy and transportation needs?: Yes Health Literacy -  How often do you need to have someone help you when you read instructions, pamphlets, or other written material from your doctor or pharmacy?: Never In the past 12 months, has lack of transportation kept you from medical appointments or from getting medications?: No In the past 12 months, has lack of transportation kept you from meetings, work, or from getting things needed for daily living?: No   Home Assistive Devices / Equipment   Prior Device Use: Indicate devices/aids used by the patient prior to current illness, exacerbation or injury? None of the above   Current Functional Level Cognition   Arousal/Alertness: Lethargic Overall Cognitive Status: Impaired/Different from baseline Difficult to assess due to: Level of arousal Current Attention Level: Sustained Orientation Level: Oriented X4 Following Commands: Follows one step commands inconsistently Safety/Judgement: Decreased awareness of safety, Decreased awareness of deficits General Comments: Continued Rt gaze preference but with max cues he was able to look to Lt visual field. Pt had been up in chair >5 hours but was reluctant to get back to bed. Attention: Focused,  Sustained Focused Attention: Impaired Focused Attention Impairment: Verbal complex Sustained Attention: Impaired Sustained Attention Impairment: Verbal complex Memory: Impaired Memory Impairment: Retrieval deficit, Decreased recall of new information (Immediate: 5/5 with repetition; delayed: 1/5; with cues: 1/4) Awareness: Impaired Awareness Impairment: Intellectual impairment Problem Solving: Impaired Problem Solving Impairment: Verbal complex Executive Function: Sequencing, Organizing Sequencing: Impaired Sequencing Impairment: Verbal complex (Clock drawing: 0/4) Organizing: Impaired Organizing Impairment: Verbal complex (backward digit span: 0/2)    Extremity Assessment (includes Sensation/Coordination)   Upper Extremity Assessment: LUE deficits/detail LUE Deficits / Details: no trace activation noted in L hand, minimal movement in flexion at elbow and shoulder shrug LUE Sensation: WNL LUE Coordination: decreased fine motor, decreased gross motor  Lower Extremity Assessment: Defer to PT evaluation LLE Deficits / Details: grossly 2-/5. Full assessment limited by lethargy LLE Sensation: WNL LLE Coordination: decreased fine motor, decreased gross motor     ADLs   Overall ADL's : Needs assistance/impaired Grooming: Bed level, Minimal assistance Upper Body Bathing: Maximal assistance, Bed level Upper Body Dressing : Moderate assistance, Sitting Upper Body Dressing Details (indicate cue type and reason): repetition of putting LUE into UB clothing first, max A intially but transitioned on mod A with practice Lower Body Dressing: Total assistance, Bed level Toilet Transfer: Moderate assistance, +2 for physical assistance, +2 for safety/equipment, Rolling walker (2 wheels) Toilet Transfer Details (indicate cue type and reason): RW with L forearm extension Functional mobility during ADLs: Moderate assistance, +2 for safety/equipment, +2 for physical assistance General ADL Comments: pt  did well with attending to L environment given verbal cues. ambulated ~84f with mod A+2     Mobility   Overal bed mobility: Needs Assistance Bed Mobility: Sit to Sidelying Rolling: Min assist Sidelying to sit: Min assist, +2 for safety/equipment Supine to sit: Mod assist Sit to sidelying: Mod assist General bed mobility comments: pt instructed on hooking "good" Rt leg under LLE to assist with return to supine but still needs modA for BLE assist and minA for rolling/repositioning in bed.     Transfers   Overall transfer level: Needs assistance Equipment used: 1 person hand held assist Transfers: Sit to/from Stand, Bed to chair/wheelchair/BSC Sit to Stand: Mod assist Bed to/from chair/wheelchair/BSC transfer type:: Stand pivot Stand pivot transfers: Mod assist Step pivot transfers: Mod assist, +2 physical assistance Transfer via Lift Equipment: Stedy General transfer comment: from chair via face to face technique pt stands and assisted to pivot back  to bed via stronger Rt side     Ambulation / Gait / Stairs / Wheelchair Mobility   Ambulation/Gait Ambulation/Gait assistance: Mod assist, +2 physical assistance Gait Distance (Feet): 10 Feet Assistive device: Left platform walker Gait Pattern/deviations: Step-to pattern, Decreased step length - left, Decreased dorsiflexion - left, Drifts right/left, Narrow base of support General Gait Details: pt needing max cues for step sequencing and L PFRW use and needs manual assist at times to advance RW. Pt with narrow BOS and Lt lean that increases with gait distance.     Posture / Balance Dynamic Sitting Balance Sitting balance - Comments: cues to maintain midline at times. Close supervision required. Balance Overall balance assessment: Needs assistance Sitting-balance support: Single extremity supported, Feet supported Sitting balance-Leahy Scale: Fair Sitting balance - Comments: cues to maintain midline at times. Close supervision  required. Postural control: Left lateral lean Standing balance support: Single extremity supported Standing balance-Leahy Scale: Poor Standing balance comment: requires cues to maintain midline in standing due to Lt lateral lean with poor carryover     Special needs/care consideration Special service needs none    Previous Home Environment (from acute therapy documentation) Living Arrangements: Spouse/significant other  Lives With: Significant other Available Help at Discharge: Family Type of Home: House Home Layout: Two level Alternate Level Stairs-Number of Steps: flight Home Access: Stairs to enter Technical brewer of Steps: 1 Bathroom Shower/Tub: Multimedia programmer: Standard Bathroom Accessibility: Yes How Accessible: Accessible via walker Home Care Services: No Additional Comments: information limited by patient's lethargy   Discharge Living Setting Plans for Discharge Living Setting: Patient's home Type of Home at Discharge: House Discharge Home Layout: Two level, Able to live on main level with bedroom/bathroom Alternate Level Stairs-Number of Steps: flight Discharge Home Access: Stairs to enter Entrance Stairs-Rails: None Entrance Stairs-Number of Steps: 1 Discharge Bathroom Shower/Tub: Tub/shower unit, Walk-in shower Discharge Bathroom Toilet: Standard Discharge Bathroom Accessibility: Yes How Accessible: Accessible via walker, Accessible via wheelchair Does the patient have any problems obtaining your medications?: No   Social/Family/Support Systems Patient Roles: Partner, Other (Comment) Contact Information: Maeola Harman Anticipated Caregiver: [phone] Ability/Limitations of Caregiver: Min A Caregiver Availability: 24/7 Discharge Plan Discussed with Primary Caregiver: Yes Is Caregiver In Agreement with Plan?: Yes Does Caregiver/Family have Issues with Lodging/Transportation while Pt is in Rehab?: No   Goals Patient/Family Goal for Rehab:  PT/OT/SLP Min A Expected length of stay: 12-14 days Pt/Family Agrees to Admission and willing to participate: Yes Program Orientation Provided & Reviewed with Pt/Caregiver Including Roles  & Responsibilities: Yes   Decrease burden of Care through IP rehab admission: Specialzed equipment needs, Decrease number of caregivers, Bowel and bladder program, and Patient/family education   Possible need for SNF placement upon discharge: not anticipated   Patient Condition: I have reviewed medical records from [facility], spoken with CSW, and patient. I met with patient at the bedside for inpatient rehabilitation assessment.  Patient will benefit from ongoing PT, OT, and SLP, can actively participate in 3 hours of therapy a day 5 days of the week, and can make measurable gains during the admission.  Patient will also benefit from the coordinated team approach during an Inpatient Acute Rehabilitation admission.  The patient will receive intensive therapy as well as Rehabilitation physician, nursing, social worker, and care management interventions.  Due to safety, skin/wound care, disease management, medication administration, pain management, and patient education the patient requires 24 hour a day rehabilitation nursing.  The patient is currently mod  A with mobility and basic ADLs.  Discharge setting and therapy post discharge at home with home health is anticipated.  Patient has agreed to participate in the Acute Inpatient Rehabilitation Program and will admit today.   Preadmission Screen Completed By:  Genella Mech, 02/06/2021 11:22 AM ______________________________________________________________________   Discussed status with Dr. Naaman Plummer on 02/06/21 at 22 and received approval for admission today.   Admission Coordinator:  Genella Mech, CCC-SLP, time 1130/Date 02/06/21    Assessment/Plan: Diagnosis: right lenticulostriate infarct Does the need for close, 24 hr/day Medical  supervision in concert with the patient's rehab needs make it unreasonable for this patient to be served in a less intensive setting? Yes Co-Morbidities requiring supervision/potential complications: etoh and drug abuse. Htn, sz disorder Due to bladder management, bowel management, safety, skin/wound care, disease management, medication administration, pain management, and patient education, does the patient require 24 hr/day rehab nursing? Yes Does the patient require coordinated care of a physician, rehab nurse, PT, OT, and SLP to address physical and functional deficits in the context of the above medical diagnosis(es)? Yes Addressing deficits in the following areas: balance, endurance, locomotion, strength, transferring, bowel/bladder control, bathing, dressing, feeding, grooming, toileting, cognition, speech, swallowing, and psychosocial support Can the patient actively participate in an intensive therapy program of at least 3 hrs of therapy 5 days a week? Yes The potential for patient to make measurable gains while on inpatient rehab is excellent Anticipated functional outcomes upon discharge from inpatient rehab: supervision and min assist PT, supervision and min assist OT, supervision SLP Estimated rehab length of stay to reach the above functional goals is: 12-14 days Anticipated discharge destination: Home 10. Overall Rehab/Functional Prognosis: excellent     MD Signature: Meredith Staggers, MD, [place] Director Rehabilitation Services 02/06/2021

## 2021-02-06 NOTE — Progress Notes (Signed)
Inpatient Rehab Admissions Coordinator:  ? ?I have a bed for this Pt. On CIR today. RN may call report to [phone]. ? ?Katera Rybka, MS, CCC-SLP ?Rehab Admissions Coordinator  ?[phone] (celll) ?[phone] (office) ?

## 2021-02-06 NOTE — TOC Transition Note (Signed)
Transition of Care [facility]) - CM/SW Discharge Note   Patient Details  Name: Damyen Knoll MRN: [id number] Date of Birth: [birth year]  [facility]) CM/SW Contact:  Pollie Friar, RN Phone Number: 02/06/2021, 11:16 AM   Clinical Narrative:    Patient is discharging to CIR today. CM signing off.    Final next level of care: IP Rehab Facility Barriers to Discharge: No Barriers Identified   Patient Goals and CMS Choice     Choice offered to / list presented to : Patient  Discharge Placement                       Discharge Plan and Services                                     Social Determinants of Health (SDOH) Interventions     Readmission Risk Interventions No flowsheet data found.

## 2021-02-06 NOTE — Discharge Summary (Signed)
Stroke Discharge Summary  Patient ID: Douglas Spencer   MRN: [id number]      DOB: 1945-06-20  Date of Admission: 01/30/2021 Date of Discharge: 02/06/2021  Attending Physician:  Stroke, Md, MD, Stroke MD Consultant(s):   rehabilitation medicine Patient's PCP:  Patient, No Pcp Per (Inactive)  Discharge Diagnoses:  Principal Problem:   Stroke - Right BG infarct likely secondary to small vessel disease given stroke location and stroke risk factors Active Problems:   Protein-calorie malnutrition, severe   HTN   HLD   Hx of seizure   Hx of stroke   Dysphagia   Smoker   Cocaine abuse   Alcohol use   Medications to be continued on Rehab Allergies as of 02/06/2021   No Known Allergies      Medication List     STOP taking these medications    levETIRAcetam 500 MG tablet Commonly known as: KEPPRA       TAKE these medications    amLODipine 10 MG tablet Commonly known as: NORVASC Take 1 tablet (10 mg total) by mouth daily.   aspirin 81 MG chewable tablet Place 1 tablet (81 mg total) into feeding tube daily. Start taking on: February 07, 2021   atorvastatin 40 MG tablet Commonly known as: LIPITOR Place 1 tablet (40 mg total) into feeding tube daily. Start taking on: February 07, 2021   clopidogrel 75 MG tablet Commonly known as: PLAVIX Place 1 tablet (75 mg total) into feeding tube daily. Start taking on: February 07, 2021   feeding supplement (PROSource TF) liquid Place 45 mLs into feeding tube daily. Start taking on: February 07, 2021   lacosamide 50 MG Tabs tablet Commonly known as: VIMPAT Place 1 tablet (50 mg total) into feeding tube 2 (two) times daily.        LABORATORY STUDIES CBC    Component Value Date/Time   WBC 7.3 02/06/2021 0103   RBC 4.93 02/06/2021 0103   HGB 14.9 02/06/2021 0103   HCT 42.8 02/06/2021 0103   PLT 171 02/06/2021 0103   MCV 86.8 02/06/2021 0103   MCH 30.2 02/06/2021 0103   MCHC 34.8 02/06/2021 0103   RDW 13.4  02/06/2021 0103   LYMPHSABS 3.1 01/30/2021 2144   MONOABS 0.7 01/30/2021 2144   EOSABS 0.2 01/30/2021 2144   BASOSABS 0.1 01/30/2021 2144   CMP    Component Value Date/Time   NA 139 02/06/2021 0103   K 3.9 02/06/2021 0103   CL 111 02/06/2021 0103   CO2 22 02/06/2021 0103   GLUCOSE 109 (H) 02/06/2021 0103   BUN 16 02/06/2021 0103   CREATININE 0.80 02/06/2021 0103   CALCIUM 8.7 (L) 02/06/2021 0103   PROT 7.0 01/30/2021 2144   ALBUMIN 4.0 01/30/2021 2144   AST 19 01/30/2021 2144   ALT 30 01/30/2021 2144   ALKPHOS 50 01/30/2021 2144   BILITOT 0.8 01/30/2021 2144   GFRNONAA >60 02/06/2021 0103   COAGS Lab Results  Component Value Date   INR 1.1 01/31/2021   INR 1.0 02/27/2020   Lipid Panel    Component Value Date/Time   CHOL 152 01/31/2021 0105   TRIG 61 01/31/2021 0105   HDL 36 (L) 01/31/2021 0105   CHOLHDL 4.2 01/31/2021 0105   VLDL 12 01/31/2021 0105   LDLCALC 104 (H) 01/31/2021 0105   HgbA1C  Lab Results  Component Value Date   HGBA1C 5.3 01/31/2021   Urinalysis    Component Value Date/Time   COLORURINE YELLOW 02/28/2020  0155   APPEARANCEUR HAZY (A) 02/28/2020 0155   LABSPEC 1.027 02/28/2020 0155   PHURINE 5.0 02/28/2020 0155   GLUCOSEU NEGATIVE 02/28/2020 0155   HGBUR NEGATIVE 02/28/2020 0155   BILIRUBINUR NEGATIVE 02/28/2020 0155   KETONESUR NEGATIVE 02/28/2020 0155   PROTEINUR 30 (A) 02/28/2020 0155   NITRITE NEGATIVE 02/28/2020 0155   LEUKOCYTESUR NEGATIVE 02/28/2020 0155   Urine Drug Screen     Component Value Date/Time   LABOPIA NONE DETECTED 01/31/2021 0154   COCAINSCRNUR POSITIVE (A) 01/31/2021 0154   LABBENZ NONE DETECTED 01/31/2021 0154   AMPHETMU NONE DETECTED 01/31/2021 0154   THCU POSITIVE (A) 01/31/2021 0154   LABBARB NONE DETECTED 01/31/2021 0154    Alcohol Level    Component Value Date/Time   ETH <10 02/27/2020 2129     SIGNIFICANT DIAGNOSTIC STUDIES CT HEAD WO CONTRAST (5MM)  Result Date: 01/31/2021 CLINICAL DATA:   Stroke, follow up post tNKase, evaluate for ICH EXAM: CT HEAD WITHOUT CONTRAST TECHNIQUE: Contiguous axial images were obtained from the base of the skull through the vertex without intravenous contrast. COMPARISON:  01/31/2021, 01/30/2021 FINDINGS: Brain: Normal anatomic configuration. Parenchymal volume loss is commensurate with the patient's age. Mild periventricular white matter changes are present likely reflecting the sequela of small vessel ischemia. Subacute infarct involving the right lentiform nucleus and caudate body is again. Remote left cerebellar infarct again noted. No abnormal intra or extra-axial mass lesion or fluid collection. No abnormal mass effect or midline shift. No evidence of acute intracranial hemorrhage. Ventricular size is normal. Cerebellum unremarkable. Vascular: Stable hyperdensity of the a right MCA. Skull: Intact Sinuses/Orbits: Mild mucosal thickening within the frontal sinuses again noted. Remaining paranasal sinuses are clear. Orbits are unremarkable. Other: Mastoid air cells and middle ear cavities are clear. IMPRESSION: Involving infarct within the right basal ganglia again identified. Stable hyperdensity within the right MCA. No superimposed acute intracranial hemorrhage. Electronically Signed   By: Fidela Salisbury M.D.   On: 01/31/2021 22:58   CT HEAD WO CONTRAST (5MM)  Result Date: 01/31/2021 CLINICAL DATA:  Mental status change, unknown cause EXAM: CT HEAD WITHOUT CONTRAST TECHNIQUE: Contiguous axial images were obtained from the base of the skull through the vertex without intravenous contrast. COMPARISON:  CTA and CT head from yesterday (01/30/2021). FINDINGS: Brain: Interval development of a hypodensity within the posterior right putamen and adjacent white matter. No significant mass effect. No midline shift. Age indeterminate infarct in the inferior left cerebellum. No acute hemorrhage. No visible mass lesion or extra-axial fluid collection. Similar remote infarct  in the left caudate. Additional patchy white matter hypoattenuation, nonspecific but compatible with chronic microvascular disease. Vascular: Hyperdense appearance of some of the vessels is favored to reflect residual contrast from prior CTA and/or artifact. Skull: No acute fracture. Sinuses/Orbits: Mild paranasal sinus mucosal thickening. Unremarkable orbits. Other: No mastoid effusions. Cerumen in bilateral external auditory canals. IMPRESSION: 1. Interval development of a hypodensity in the right putamen and adjacent white matter, concerning for acute perforator infarct. Age indeterminate infarct in the inferior left cerebellum. If the patient is able, recommend MRI to further evaluate. 2. No acute hemorrhage or significant mass effect. 3. 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 These results will be called to the ordering clinician or representative by the Radiologist Assistant, and communication documented in the PACS or [employer]. Electronically Signed   By: Margaretha Sheffield M.D.   On: 01/31/2021 08:11   CT Head Wo Contrast  Result Date: 01/30/2021 CLINICAL DATA:  Left hemineglect,  stroke status post tPA administration EXAM: CT HEAD WITHOUT CONTRAST TECHNIQUE: Contiguous axial images were obtained from the base of the skull through the vertex without intravenous contrast. COMPARISON:  9:46 p.m. FINDINGS: Brain: Normal anatomic configuration. Parenchymal volume loss is commensurate with the patient's age. Mild periventricular white matter changes are present likely reflecting the sequela of small vessel ischemia. Remote left cerebellar infarct again noted. No abnormal intra or extra-axial mass lesion or fluid collection. No abnormal mass effect or midline shift. No evidence of acute intracranial hemorrhage or infarct. Ventricular size is normal. Cerebellum unremarkable. Vascular: No asymmetric hyperdense vasculature at the skull base. Skull: Intact Sinuses/Orbits: Mild  mucosal thickening within the frontal sinuses. Remaining paranasal sinuses are clear. Orbits are unremarkable. Other: Mastoid air cells and middle ear cavities are clear. IMPRESSION: No acute intracranial hemorrhage or infarct. Stable mild senescent change. Unchanged remote left cerebellar infarct. Electronically Signed   By: Fidela Salisbury M.D.   On: 01/30/2021 23:13   MR BRAIN WO CONTRAST  Result Date: 02/01/2021 CLINICAL DATA:  Acute neuro deficit. Rule out stroke. History of seizure. EXAM: MRI HEAD WITHOUT CONTRAST TECHNIQUE: Multiplanar, multiecho pulse sequences of the brain and surrounding structures were obtained without intravenous contrast. COMPARISON:  CT head 01/31/2021 FINDINGS: Brain: Acute infarct lenticulostriate territory on the right. Infarct involves the body of the caudate, putamen, external capsule, and corona radiata. No other acute infarct. Mild atrophy. Mild chronic microvascular ischemic change in the white matter. Negative for hemorrhage or mass. Vascular: Normal arterial flow voids in the skull base. Skull and upper cervical spine: No focal lesion. Sinuses/Orbits: Prominent superior ophthalmic vein bilaterally right greater than left. Review of the recent CTA head and neck 01/30/2021 reveals no significant enlargement of this vessel. No thrombus. No evidence of cavernous carotid fistula on CTA. Cavernous sinus is normal. No orbital mass. Mild mucosal edema paranasal sinuses. Other: None IMPRESSION: Acute infarct right lenticulostriate territory. Moderately large territory involved. No intracranial hemorrhage Atrophy and mild chronic microvascular ischemic change Prominent superior ophthalmic vein bilaterally right greater left best seen on axial and coronal FLAIR. No associated abnormality on recent CT angio. This may be flow related artifact Electronically Signed   By: Franchot Gallo M.D.   On: 02/01/2021 13:18   DG Abd Portable 1V  Result Date: 02/02/2021 CLINICAL DATA:  NG  tube placement EXAM: PORTABLE ABDOMEN - 1 VIEW COMPARISON:  None. FINDINGS: Weighted tip enteric tube with distal tip in the body of the stomach. Nonobstructive bowel gas pattern. 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 IMPRESSION: Weighted tip enteric tube with distal tip in the body of the stomach. Electronically Signed   By: Keane Police D.O.   On: 02/02/2021 12:43   DG Swallowing Func-Speech Pathology  Result Date: 02/06/2021 Table formatting from the original result was not included. Objective Swallowing Evaluation: Type of Study: MBS-Modified Barium Swallow Study  Patient Details Name: Douglas Spencer MRN: [id number] Date of Birth: 10-Nov-1945 Today's Date: 02/06/2021 Time: SLP Start Time (ACUTE ONLY): 0820 -SLP Stop Time (ACUTE ONLY): 0847 SLP Time Calculation (min) (ACUTE ONLY): 27 min Past Medical History: No past medical history on file. Past Surgical History: No past surgical history on file. HPI: Pt is a 75 y.o. male who presented to the ED secondary to right-sided gaze, left-sided weakness and drift, slurred speech. TNK given. 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 EEG 12/21,12/22 were WNL. PMH: alcohol use but he  quit drinking in January 2022, polysubstance abuse and smoker, seizure and noncompliant with his Keppra.  Subjective: [birth year] [age] [birth year]  Recommendations for follow up therapy are one component of a multi-disciplinary discharge planning process, led by the attending physician.  Recommendations may be updated based on patient status, additional functional criteria and insurance authorization. Assessment / Plan / Recommendation Clinical Impressions 02/06/2021 Clinical Impression Pt demonstrates moderate oropharyngeal dysphagia with instance of trace sensed aspiration during the swallow with thin and nectar thick liquids with larger boluses, likely due to decreased laryngeal closure. Pt  is able to achieve early and firm laryngeal closure with cues to "get ready to swallow" with nectar and thin liquids. Pt also with mild base of tongue weakness and reduced epiglottic deflection leading to mild residue with solids. Pt able to clear most penetrate/aspirate and residue with a throat clear and second swallow. Recommend a dys 2, vegetarian diet with nectar thick liquids. Pt is motivated to eat and following instructions well . Suggest removal of NG tube which may improve swallowing. SLP will f/u for further swallowing interventions. SLP Visit Diagnosis Dysphagia, oropharyngeal phase (R13.12) Attention and concentration deficit following -- Frontal lobe and executive function deficit following -- Impact on safety and function Moderate aspiration risk   Treatment Recommendations 02/06/2021 Treatment Recommendations Therapy as outlined in treatment plan below   Prognosis 02/06/2021 Prognosis for Safe Diet Advancement Good Barriers to Reach Goals -- Barriers/Prognosis Comment -- Diet Recommendations 02/06/2021 SLP Diet Recommendations Dysphagia 2 (Fine chop) solids;Nectar thick liquid Liquid Administration via Cup;Straw Medication Administration Whole meds with puree Compensations Slow rate;Small sips/bites;Follow solids with liquid;Clear throat intermittently;Effortful swallow;Multiple dry swallows after each bite/sip Postural Changes --   Other Recommendations 02/06/2021 Recommended Consults -- Oral Care Recommendations Oral care BID Other Recommendations -- Follow Up Recommendations Acute inpatient rehab (3hours/day) Assistance recommended at discharge Frequent or constant Supervision/Assistance Functional Status Assessment Patient has had a recent decline in their functional status and demonstrates the ability to make significant improvements in function in a reasonable and predictable amount of time. Frequency and Duration  02/06/2021 Speech Therapy Frequency (ACUTE ONLY) min 2x/week Treatment Duration  --   Oral Phase 02/06/2021 Oral Phase Impaired Oral - Pudding Teaspoon -- Oral - Pudding Cup -- Oral - Honey Teaspoon -- Oral - Honey Cup -- Oral - Nectar Teaspoon Lingual/palatal residue Oral - Nectar Cup Lingual/palatal residue Oral - Nectar Straw Lingual/palatal residue Oral - Thin Teaspoon -- Oral - Thin Cup Lingual/palatal residue Oral - Thin Straw Lingual/palatal residue Oral - Puree Lingual/palatal residue Oral - Mech Soft Lingual/palatal residue Oral - Regular -- Oral - Multi-Consistency -- Oral - Pill Lingual/palatal residue Oral Phase - Comment --  Pharyngeal Phase 02/06/2021 Pharyngeal Phase Impaired Pharyngeal- Pudding Teaspoon -- Pharyngeal -- Pharyngeal- Pudding Cup -- Pharyngeal -- Pharyngeal- Honey Teaspoon -- Pharyngeal -- Pharyngeal- Honey Cup -- Pharyngeal -- Pharyngeal- Nectar Teaspoon Reduced epiglottic inversion;Reduced tongue base retraction;Reduced airway/laryngeal closure;Pharyngeal residue - valleculae Pharyngeal -- Pharyngeal- Nectar Cup Reduced epiglottic inversion;Reduced tongue base retraction;Reduced airway/laryngeal closure;Penetration/Aspiration during swallow;Trace aspiration;Pharyngeal residue - valleculae Pharyngeal Material enters airway, passes BELOW cords then ejected out;Material enters airway, passes BELOW cords and not ejected out despite cough attempt by patient;Material does not enter airway Pharyngeal- Nectar Straw Reduced epiglottic inversion;Reduced tongue base retraction;Pharyngeal residue - valleculae Pharyngeal -- Pharyngeal- Thin Teaspoon -- Pharyngeal -- Pharyngeal- Thin Cup Penetration/Aspiration during swallow;Penetration/Aspiration before swallow;Reduced epiglottic inversion;Reduced tongue base retraction;Reduced airway/laryngeal closure;Pharyngeal residue - valleculae;Trace aspiration Pharyngeal Material enters airway, passes BELOW cords then ejected out;Material  enters airway, passes BELOW cords and not ejected out despite cough attempt by patient;Material  does not enter airway Pharyngeal- Thin Straw Penetration/Aspiration during swallow;Penetration/Aspiration before swallow;Reduced epiglottic inversion;Reduced tongue base retraction;Reduced airway/laryngeal closure;Pharyngeal residue - valleculae;Trace aspiration Pharyngeal Material enters airway, passes BELOW cords then ejected out;Material enters airway, passes BELOW cords and not ejected out despite cough attempt by patient;Material does not enter airway Pharyngeal- Puree Reduced epiglottic inversion;Reduced tongue base retraction;Pharyngeal residue - valleculae Pharyngeal -- Pharyngeal- Mechanical Soft Reduced epiglottic inversion;Reduced tongue base retraction;Pharyngeal residue - valleculae Pharyngeal -- Pharyngeal- Regular -- Pharyngeal -- Pharyngeal- Multi-consistency -- Pharyngeal -- Pharyngeal- Pill -- Pharyngeal -- Pharyngeal Comment --  No flowsheet data found. DeBlois, Katherene Ponto 02/06/2021, 9:56 AM                     EEG adult  Result Date: 01/31/2021 Lora Havens, MD     01/31/2021  8:22 AM Patient Name: Douglas Spencer MRN: [id number] Epilepsy Attending: Lora Havens Referring Physician/Provider: Dr Donnetta Simpers Date: 01/31/2021 Duration: 26.32 mins Patient history: 75 y.o. 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 EEG to evaluate for seizure Level of alertness: Awake AEDs during EEG study: LEV Technical aspects: This EEG study was done with scalp electrodes positioned according to the 10-20 International system of electrode placement. Electrical activity was acquired at a sampling rate of 500Hz  and reviewed with a high frequency filter of 70Hz  and a low frequency filter of 1Hz . EEG data were recorded continuously and digitally  stored. Description: The posterior dominant rhythm consists of 9-10 Hz activity of moderate voltage (25-35 uV) seen predominantly in posterior head regions, symmetric and reactive to eye opening and eye closing. Hyperventilation and photic stimulation were not performed.   IMPRESSION: This study is within normal limits. No seizures or epileptiform discharges were seen throughout the recording. Priyanka [name]   Overnight EEG with video  Result Date: 01/31/2021 Lora Havens, MD     02/01/2021  9:26 AM Patient Name: Douglas Spencer MRN: [id number] Epilepsy Attending: Lora Havens Referring Physician/Provider: Dr Donnetta Simpers Duration: 01/31/2021 0149 to 02/01/2021 0149  Patient history: 75 y.o. 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 EEG to evaluate for seizure  Level of alertness: Awake, asleep  AEDs during EEG study: LCM  Technical aspects: This EEG study was done with scalp electrodes positioned according to the 10-20 International system of electrode placement. Electrical activity was acquired at a sampling rate of 500Hz  and reviewed with a high frequency filter of 70Hz  and a low frequency filter of 1Hz . EEG data were recorded continuously and digitally stored.  Description: The posterior dominant rhythm consists of 9-10 Hz activity of moderate voltage (25-35 uV) seen predominantly in posterior head regions, symmetric and reactive to eye opening and eye closing. Hyperventilation and photic stimulation were not performed.    IMPRESSION: This study is within normal limits. No seizures or epileptiform discharges were seen throughout the recording.  [place]   ECHOCARDIOGRAM COMPLETE  Result  Date: 01/31/2021    ECHOCARDIOGRAM REPORT    Patient Name:   Douglas Spencer Date of Exam: 01/31/2021 Medical Rec #:  [id number]      Height:       68.0 in Accession #:    [id number]     Weight:       131.4 lb Date of Birth:  [birth year]     BSA:          1.710 m Patient Age:    64 years       BP:           153/92 mmHg Patient Gender: M              HR:           63 bpm. Exam Location:  Inpatient Procedure: 2D Echo, Cardiac Doppler and Color Doppler Indications:    Stroke  History:        Patient has no prior history of Echocardiogram examinations.  Sonographer:    Merrie Roof RDCS Referring Phys: [id number] [place]  1. Left ventricular ejection fraction, by estimation, is 60 to 65%. The left ventricle has normal function. The left ventricle has no regional wall motion abnormalities. Left ventricular diastolic parameters are consistent with Grade I diastolic dysfunction (impaired relaxation).  2. Right ventricular systolic function is normal. The right ventricular size is normal.  3. The mitral valve is myxomatous. Mild mitral valve regurgitation. No evidence of mitral stenosis.  4. The aortic valve is tricuspid. There is moderate calcification of the aortic valve. There is moderate thickening of the aortic valve. Aortic valve regurgitation is not visualized. Aortic valve sclerosis/calcification is present, without any evidence of aortic stenosis.  5. The inferior vena cava is normal in size with greater than 50% respiratory variability, suggesting right atrial pressure of 3 mmHg. FINDINGS  Left Ventricle: Left ventricular ejection fraction, by estimation, is 60 to 65%. The left ventricle has normal function. The left ventricle has no regional wall motion abnormalities. The left ventricular internal cavity size was normal in size. There is  no left ventricular hypertrophy. Left ventricular diastolic parameters are consistent with Grade I diastolic dysfunction (impaired relaxation). Right Ventricle: The right ventricular size is normal. No  increase in right ventricular wall thickness. Right ventricular systolic function is normal. Left Atrium: Left atrial size was normal in size. Right Atrium: Right atrial size was normal in size. Prominent Eustachian valve. Pericardium: There is no evidence of pericardial effusion. 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 There is mild thickening of the mitral valve leaflet(s). Mild mitral annular calcification. Mild mitral valve regurgitation, with centrally-directed jet. No evidence of mitral valve stenosis. Tricuspid Valve: The tricuspid valve is normal in structure. Tricuspid valve regurgitation is trivial. No evidence of tricuspid stenosis. Aortic Valve: The aortic valve is tricuspid. There is moderate calcification of the aortic valve. There is moderate thickening of the aortic valve. Aortic valve regurgitation is not visualized. Aortic valve sclerosis/calcification is present, without any  evidence of aortic stenosis. Aortic valve mean gradient measures 3.0 mmHg. Aortic valve peak gradient measures 5.1 mmHg. Aortic valve area, by VTI measures 2.21 cm. Pulmonic Valve: The pulmonic valve was normal in structure. Pulmonic valve regurgitation is not visualized. No evidence of pulmonic stenosis. Aorta: The aortic root is normal in size and structure. Venous: The inferior vena cava is normal in size with greater than 50% respiratory variability, suggesting right atrial pressure of 3 mmHg. IAS/Shunts: No atrial level shunt detected by color  flow Doppler.  LEFT VENTRICLE PLAX 2D LVIDd:         3.70 cm   Diastology LVIDs:         2.50 cm   LV e' medial:    5.77 cm/s LV PW:         0.90 cm   LV E/e' medial:  12.6 LV IVS:        0.60 cm   LV e' lateral:   6.09 cm/s LVOT diam:     1.90 cm   LV E/e' lateral: 12.0 LV SV:         58 LV SV Index:   34 LVOT Area:     2.84 cm  RIGHT VENTRICLE RV Basal diam:  3.90 cm LEFT ATRIUM             Index        RIGHT ATRIUM           Index LA diam:        2.50 cm 1.46 cm/m   RA  Area:     10.90 cm LA Vol (A2C):   36.8 ml 21.53 ml/m  RA Volume:   24.20 ml  14.16 ml/m LA Vol (A4C):   19.8 ml 11.58 ml/m LA Biplane Vol: 28.6 ml 16.73 ml/m  AORTIC VALVE AV Area (Vmax):    2.50 cm AV Area (Vmean):   2.45 cm AV Area (VTI):     2.21 cm AV Vmax:           113.00 cm/s AV Vmean:          76.800 cm/s AV VTI:            0.263 m AV Peak Grad:      5.1 mmHg AV Mean Grad:      3.0 mmHg LVOT Vmax:         99.80 cm/s LVOT Vmean:        66.400 cm/s LVOT VTI:          0.205 m LVOT/AV VTI ratio: 0.78  AORTA Ao Root diam: 2.90 cm MITRAL VALVE MV Area (PHT): 2.76 cm    SHUNTS MV Decel Time: 275 msec    Systemic VTI:  0.20 m MV E velocity: 72.80 cm/s  Systemic Diam: 1.90 cm MV A velocity: 88.70 cm/s MV E/A ratio:  0.82 Mihai Croitoru MD Electronically signed by Sanda Klein MD Signature Date/Time: 01/31/2021/6:37:43 PM    Final    CT HEAD CODE STROKE WO CONTRAST  Result Date: 01/30/2021 CLINICAL DATA:  Code stroke. EXAM: CT HEAD WITHOUT CONTRAST TECHNIQUE: Contiguous axial images were obtained from the base of the skull through the vertex without intravenous contrast. COMPARISON:  Prior MRI from 02/28/2020 FINDINGS: Brain: Age-related cerebral atrophy with mild chronic small vessel ischemic disease. Remote left cerebellar infarct. No acute intracranial hemorrhage. No acute large vessel territory infarct. No mass lesion, midline shift or mass effect. No hydrocephalus or extra-axial fluid collection. Vascular: No hyperdense vessel. Skull: Scalp soft tissues and calvarium demonstrate no acute finding. Sinuses/Orbits: Right gaze noted. Mild scattered mucosal thickening noted within the ethmoidal air cells and maxillary sinuses. Paranasal sinuses are otherwise largely clear. No mastoid effusion. Other: None. ASPECTS [facility] Stroke Program Early CT Score) - Ganglionic level infarction (caudate, lentiform nuclei, internal capsule, insula, M1-M3 cortex): 7 - Supraganglionic infarction (M4-M6 cortex): 3  Total score (0-10 with 10 being normal): 10 IMPRESSION: 1. No acute intracranial abnormality. 2. ASPECTS is 10. 3. Age-related cerebral atrophy with chronic  microvascular ischemic disease, with small remote left cerebellar infarct. These results were communicated to Dr. Lorrin Goodell at 10:01 pm on 01/30/2021 by text page via the [facility] messaging system. Electronically Signed   By: Jeannine Boga M.D.   On: 01/30/2021 22:04   CT ANGIO HEAD NECK W WO CM (CODE STROKE)  Result Date: 01/30/2021 CLINICAL DATA:  Initial evaluation for neuro deficit, stroke suspected. EXAM: CT ANGIOGRAPHY HEAD AND NECK TECHNIQUE: Multidetector CT imaging of the head and neck was performed using the standard protocol during bolus administration of intravenous contrast. Multiplanar CT image reconstructions and MIPs were obtained to evaluate the vascular anatomy. Carotid stenosis measurements (when applicable) are obtained utilizing NASCET criteria, using the distal internal carotid diameter as the denominator. CONTRAST:  47mL OMNIPAQUE IOHEXOL 350 MG/ML SOLN COMPARISON:  Head CT from earlier the same day. FINDINGS: CTA NECK FINDINGS Aortic arch: Visualized aortic arch normal caliber with normal 3 vessel morphology. Mild plaque about the arch itself. No hemodynamically significant stenosis about the origin the great vessels. Right carotid system: Right common and internal carotid arteries patent without stenosis or dissection. Mild for age atheromatous change about the right carotid bulb without stenosis. Left carotid system: Left common and internal carotid arteries patent without stenosis or dissection. Minimal for age atheromatous change about the left carotid bulb without stenosis. Vertebral arteries: Both vertebral arteries arise from the subclavian arteries. No significant proximal subclavian artery stenosis. Vertebral arteries patent without stenosis, dissection or occlusion. Skeleton: No worrisome lytic or blastic osseous  lesions. Mild for age cervical spondylosis. Patient is edentulous. Other neck: No other acute soft tissue abnormality within the neck. Upper chest: Visualized upper chest demonstrates no acute finding. Review of the MIP images confirms the above findings CTA HEAD FINDINGS Anterior circulation: Both internal carotid arteries widely patent to the termini without stenosis. A1 segments widely patent. Normal anterior communicating artery complex. Both anterior cerebral arteries widely patent to their distal aspects without stenosis. No M1 stenosis or occlusion. Normal MCA bifurcations. Distal MCA branches well perfused and symmetric. Posterior circulation: Both V4 segments patent to the vertebrobasilar junction without stenosis. Both PICA origins patent and normal. Basilar widely patent to its distal aspect without stenosis. Superior cerebellar arteries patent bilaterally. Both PCAs primarily supplied via the basilar and are well perfused to there distal aspects. Venous sinuses: Patent allowing for timing the contrast bolus. Anatomic variants: None significant.  No aneurysm. Review of the MIP images confirms the above findings IMPRESSION: 1. Negative CTA for large vessel occlusion. 2. Mild for age atheromatous change about the carotid bifurcations without hemodynamically significant stenosis. Critical Value/emergent results were called by telephone at the time of interpretation on 01/30/2021 at 10:07 pm to provider [facility] , who verbally acknowledged these results. Electronically Signed   By: Jeannine Boga M.D.   On: 01/30/2021 22:19       HISTORY OF PRESENT ILLNESS Douglas Spencer is a 75 y.o. male with PMH significant for prior history of alcohol use but he quit drinking in January 2022, prior history of polysubstance abuse and smoker, prior history of seizure and noncompliant with his Keppra.  Patient lives with his friend and friend saw him walking in the house at his baseline at 2045.  He went  out and walked back into the house and found him laying on the floor and not moving his left side.  No witnessed seizure activity.  On chart review appears that in January 2022, he was evaluated by her team for seizure activity  with resultant right-sided weakness but his symptoms rapidly improved.  On arrival, patient is awake alert and talkative.  He is able to move his right upper extremity and right lower extremity spontaneously with no aphasia.  He is plegic in left upper extremity and left lower extremity with gaze deviation to the right and does not cross midline.   Stat CT head without contrast was obtained and was negative for an acute ICH.  Aspects of 10. He was evaluated after CTH and was noted to have persistent left-sided weakness with right gaze deviation and some concern for potential extinction/neglect with no improvement in his symptoms.   tNKase was discussed with patient and at his request with family including his brother Douglas [phone]) and with patient's cousin Ms. [phone]). Patient and family consented to tNKASE. Slight delay in tNKAse administration due to having to discuss with multiple family members at patient's request.   mRS: 0 tNKASE: offered and given Thrombectomy: not offered 2/2 no LVO.   HOSPITAL COURSE Mr. Cambren Helm is a 75 y.o. male with history of ETOH with seizure, non compliant with keppra, polysubstance use, and smoking presenting with left side weakness. He was found on the floor by his friend unable to move his left side. LKW 2045 on 01/30/2021. On arrival to the ED he was awake, alert, and talkative and moving his right side spontaneously. He was plegic in this left upper and lower extremity with gaze deviation to the right. TNK was given.  He became more lethargic over night on 12/21 at around 0500 and was taken for a repeat head CT and had an EEG ordered. MRI ordered for 12/22 demonstrates acute infarct in right lenticulostriate  territory with no hemorrhage. He will hopefully be ready to go to CIR within the next few days.   Stroke:  Right BG infarct likely secondary to small vessel disease given location and stroke risk factors Code Stroke -CT head No acute abnormality.  CTA head & neck - negative for LVO MRI  acute infarct in right lenticulostriate territory with no hemorrhage 2D Echo EF 60 to 65%.  No wall motion abnormality. LDL 104 HgbA1c 5.3 UDS positive for cocaine and THC VTE prophylaxis - lovenox No antithrombotic prior to admission, now on ASA 81 and plavix DAPT for 3 weeks and then ASA alone Therapy recommendations: CIR Disposition:  CIR today   Hypertension Home meds- None Stable on the high end On amlodipine 10 Long-term BP goal normotensive   Hx of seizure 02/2020 ED visit for seizure with head turning to right with body locking up and genralized shaking for 3-5 min. Then post ictal with right sided weakness, left gaze and aphasia. MRI negative for stroke. EEG cancelled as pt left AMA. Given the focality of the seizure activity, started on keppra 500 bid Pt stopped keppra shortly after ED discharge due to wired feeling taking meds Now on vimpat 50 bid, continue on discharge ($0 copay for him) EEG- no seizure or epileptiform discharges seen throughout the recording   Hyperlipidemia Home meds:  None LDL 104, goal < 70  Atorvastatin 40 mg daily Continue statin at discharge   Dysarthria  Passed MBS Now on mechanical soft and nectar thick On TF@ 55 for one more day, if adequate po intake, will d/c tomorrow Speech on board   Tobacco abuse Current smoker Smoking cessation counseling provided Pt is willing to quit   Substance abuse UDS positive for cocaine and THC every time  Previous overdose in  2019 Cessation education provided Pt is willing to quit   Other Stroke Risk Factors Advanced Age >/= 59  ETOH use, alcohol level <10, advised to drink no more than 1/2 drink(s) a day Quit  January 2022 Hx stroke/TIA - 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 rate 16, height 5\' 8"  (1.727 m), weight 59.8 kg, SpO2 96 %.  General:  Alert, awake, well-developed male in no acute distress   NEURO:  Mental Status: AA&Ox3  Speech/Language: mild to moderate dysarthria present.  Naming and comprehension intact. Cranial Nerves:  II: PERRL. Visual fields full.  III, IV, VI: Right gaze preference, but cross midline with incomplete left gaze V: Sensation is intact to light touch and symmetrical to face.  VII: Left facial droop  VIII: hearing intact to voice. IX, X: Phonation is normal.  XII: tongue is midline without fasciculations. Motor: 5/5 strength to RUE and RLE.  0/5 deltoid, 3-/5 bicep, 0/5 finger movement at LUE and 4/5 strength to LLE Sensation- Intact to light touch bilaterally. Extinction absent to light touch to DSS.   Coordination - right FTN intact Gait- deferred  Discharge Diet      Diet   Diet vegetarian Room service appropriate? Yes; Fluid consistency: Nectar Thick   liquids  DISCHARGE PLAN Disposition:  Transfer to [place] for ongoing PT, OT and ST aspirin 81 mg daily and clopidogrel 75 mg daily for secondary stroke prevention for 3 weeks then ASA alone. Recommend ongoing stroke risk factor control by Primary Care Physician at time of discharge from inpatient rehabilitation. Follow-up PCP in 2 weeks following discharge from rehab. Follow-up in [facility] in 4 weeks following discharge from rehab, office to schedule an appointment.   35 minutes were spent preparing discharge.  Rosalin Hawking, MD PhD Stroke Neurology 02/06/2021 12:10 PM

## 2021-02-06 NOTE — TOC Benefit Eligibility Note (Signed)
Patient Teacher, English as a foreign language completed.    The patient is currently admitted and upon discharge could be taking lacosamide (Vimpat) 50 mg.  The current 30 day co-pay is, $0.00.   The patient is insured through [place], [place] Patient Advocate Specialist [place] Patient Advocate Team Direct Number: [phone]  Fax: [phone]

## 2021-02-06 NOTE — Progress Notes (Signed)
Inpatient Rehabilitation Admission Medication Review by a Pharmacist  A complete drug regimen review was completed for this patient to identify any potential clinically significant medication issues.  High Risk Drug Classes Is patient taking? Indication by Medication  Antipsychotic No   Anticoagulant Yes Lovenox- VTE prophylaxis  Antibiotic No   Opioid No   Antiplatelet Yes Aspirin/Plavix- CVA prophylaxis-secondary prevention  Hypoglycemics/insulin No   Vasoactive Medication No   Chemotherapy No   Other Yes Lipitor- HLD Vinpat- seizure Protonix- GERD     Type of Medication Issue Identified Description of Issue Recommendation(s)  Drug Interaction(s) (clinically significant)     Duplicate Therapy     Allergy     No Medication Administration End Date  Plavix Neurology recommends DAPT (aspirin/plavix) x3 weeks followed by aspirin monotherapy. DAPT began 02/05/2021 and is scheduled to end after the last dose on 02/25/2021. Starting 02/26/2021, patient is to start aspirin monotherapy. Discharge summary Note: 02/06/2021 Dr. Erlinda Hong  Incorrect Dose     Additional Drug Therapy Needed     Significant med changes from prior encounter (inform family/care partners about these prior to discharge).    Other       Clinically significant medication issues were identified that warrant physician communication and completion of prescribed/recommended actions by midnight of the next day:  No   Pharmacist comments:   Time spent performing this drug regimen review (minutes):  30   Kimm Ungaro BS, PharmD, BCPS Clinical Pharmacist 02/07/2021 7:51 AM

## 2021-02-06 NOTE — Progress Notes (Signed)
SCD's applied per orders

## 2021-02-06 NOTE — H&P (Signed)
Physical Medicine and Rehabilitation Admission H&P        Chief Complaint  Patient presents with   Code Stroke  : HPI: Douglas Spencer is a 75 year old right-handed male with history of alcohol use/polysubstance and tobacco use as well as seizure disorder noncompliant with Keppra.  Per chart review lives with a friend.  Two-level home one-step to entry.  Reportedly independent prior to admission.  Presented 01/30/2021 with acute onset of left side weakness as well as dysarthric speech.  Cranial CT scan negative for acute changes.  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  CT angiogram of head and neck no large vessel occlusion.  MRI showed acute infarct right lenticulostriate territory.  Moderately large territory involved.  No intracranial hemorrhage.  Neurology follow-up patient consented to tNKASE.  Echocardiogram with ejection fraction of 60 to 65% no wall motion abnormalities.  Admission chemistries were unremarkable except sodium 133, urine drug screen positive for marijuana as well as cocaine.  Currently maintained on aspirin 81 mg  daily and Plavix 75 mg daily for CVA prophylaxis x3 weeks then aspirin alone.  Lovenox for DVT prophylaxis.  EEG showed no signs of seizure patient currently maintained on Vimpat.  Currently n.p.o. with alternative means of nutritional support.  Therapy evaluations completed due to patient decreased functional ability left side weakness was admitted for a comprehensive rehab program. Pt had MBS today and was cleared for D3/nectars. Still has NGT in place   Review of Systems  Constitutional:  Negative for chills and fever.  HENT:  Negative for hearing loss.   Eyes:  Negative for blurred vision and double vision.  Respiratory:  Negative for cough and shortness of breath.   Cardiovascular:  Negative for chest pain, palpitations and leg swelling.  Gastrointestinal:  Positive for constipation.  Negative for heartburn, nausea and vomiting.  Genitourinary:  Negative for dysuria, flank pain and hematuria.  Musculoskeletal:  Positive for joint pain and myalgias.  Skin:  Negative for rash.  Neurological:  Positive for speech change, seizures and weakness.  All other systems reviewed and are negative. No past medical history on file. No past surgical history on file. No family history on file. Social History:  reports that he has been smoking cigarettes. He has been smoking an average of .5 packs per day. He does not have any smokeless tobacco history on file. He reports current alcohol use. He reports current drug use. Drug: Marijuana. Allergies: No Known Allergies       Medications Prior to Admission  Medication Sig Dispense Refill   levETIRAcetam (KEPPRA) 500 MG tablet TAKE 1 TABLET (500 MG TOTAL) BY MOUTH TWO TIMES DAILY. (Patient not taking: Reported on 01/31/2021) 60 tablet 0      Drug Regimen Review Drug regimen was reviewed and remains appropriate with no significant issues identified   Home: Home Living Family/patient expects to be discharged to:: Private residence Living Arrangements: Spouse/significant other Available Help at Discharge: Family Type of Home: House Home Access: Stairs to enter Technical brewer of Steps: 1 Home Layout: Two level Alternate Level Stairs-Number of Steps: flight Bathroom Shower/Tub: Multimedia programmer: Standard Bathroom Accessibility: Yes Additional Comments: information limited by patient's lethargy  Lives With: Significant other   Functional History: Prior Function Prior Level of Function : Independent/Modified Independent   Functional Status:  Mobility: Bed Mobility Overal bed mobility: Needs Assistance Bed Mobility: Sit to Sidelying Rolling: Min assist Sidelying to sit: Min assist, +2 for  safety/equipment Supine to sit: Mod assist Sit to sidelying: Mod assist General bed mobility comments: pt instructed  on hooking "good" Rt leg under LLE to assist with return to supine but still needs modA for BLE assist and minA for rolling/repositioning in bed. Transfers Overall transfer level: Needs assistance Equipment used: 1 person hand held assist Transfers: Sit to/from Stand, Bed to chair/wheelchair/BSC Sit to Stand: Mod assist Bed to/from chair/wheelchair/BSC transfer type:: Stand pivot Stand pivot transfers: Mod assist Step pivot transfers: Mod assist, +2 physical assistance Transfer via Lift Equipment: Stedy General transfer comment: from chair via face to face technique pt stands and assisted to pivot back to bed via stronger Rt side Ambulation/Gait Ambulation/Gait assistance: Mod assist, +2 physical assistance Gait Distance (Feet): 10 Feet Assistive device: Left platform walker Gait Pattern/deviations: Step-to pattern, Decreased step length - left, Decreased dorsiflexion - left, Drifts right/left, Narrow base of support General Gait Details: pt needing max cues for step sequencing and L PFRW use and needs manual assist at times to advance RW. Pt with narrow BOS and Lt lean that increases with gait distance.   ADL: ADL Overall ADL's : Needs assistance/impaired Grooming: Bed level, Minimal assistance Upper Body Bathing: Maximal assistance, Bed level Upper Body Dressing : Moderate assistance, Sitting Upper Body Dressing Details (indicate cue type and reason): repetition of putting LUE into UB clothing first, max A intially but transitioned on mod A with practice Lower Body Dressing: Total assistance, Bed level Toilet Transfer: Moderate assistance, +2 for physical assistance, +2 for safety/equipment, Rolling walker (2 wheels) Toilet Transfer Details (indicate cue type and reason): RW with L forearm extension Functional mobility during ADLs: Moderate assistance, +2 for safety/equipment, +2 for physical assistance General ADL Comments: pt did well with attending to L environment given verbal  cues. ambulated ~100ft with mod A+2   Cognition: Cognition Overall Cognitive Status: Impaired/Different from baseline Arousal/Alertness: Lethargic Orientation Level: Oriented X4 Year: 2021 Month: December Day of Week: Correct Attention: Focused, Sustained Focused Attention: Impaired Focused Attention Impairment: Verbal complex Sustained Attention: Impaired Sustained Attention Impairment: Verbal complex Memory: Impaired Memory Impairment: Retrieval deficit, Decreased recall of new information (Immediate: 5/5 with repetition; delayed: 1/5; with cues: 1/4) Awareness: Impaired Awareness Impairment: Intellectual impairment Problem Solving: Impaired Problem Solving Impairment: Verbal complex Executive Function: Sequencing, Organizing Sequencing: Impaired Sequencing Impairment: Verbal complex (Clock drawing: 0/4) Organizing: Impaired Organizing Impairment: Verbal complex (backward digit span: 0/2) Cognition Arousal/Alertness: Awake/alert Behavior During Therapy: Flat affect Overall Cognitive Status: Impaired/Different from baseline Area of Impairment: Orientation, Memory, Following commands, Safety/judgement, Attention, Problem solving, Awareness Orientation Level: Disoriented to, Situation Current Attention Level: Sustained Memory: Decreased short-term memory, Decreased recall of precautions Following Commands: Follows one step commands inconsistently Safety/Judgement: Decreased awareness of safety, Decreased awareness of deficits Awareness: Intellectual Problem Solving: Slow processing, Decreased initiation, Requires verbal cues General Comments: Continued Rt gaze preference but with max cues he was able to look to Lt visual field. Pt had been up in chair >5 hours but was reluctant to get back to bed. Difficult to assess due to: Level of arousal   Physical Exam: Blood pressure 133/82, pulse 70, temperature 97.8 F (36.6 C), temperature source Oral, resp. rate 20, height 5\' 8"   (1.727 m), weight 59.8 kg, SpO2 95 %. Physical Exam Constitutional:      Appearance: He is ill-appearing.  HENT:     Head: Normocephalic.     Comments: NGT in place    Right Ear: External ear normal.     Left Ear: External  ear normal.     Nose: Nose normal.     Mouth/Throat:     Mouth: Mucous membranes are moist.     Pharynx: Oropharynx is clear.  Eyes:     General: No scleral icterus. Cardiovascular:     Rate and Rhythm: Normal rate and regular rhythm.     Heart sounds: No murmur heard.   No gallop.  Pulmonary:     Effort: Pulmonary effort is normal. No respiratory distress.     Breath sounds: No wheezing.  Abdominal:     General: Bowel sounds are normal. There is no distension.     Palpations: Abdomen is soft.     Tenderness: There is no abdominal tenderness.  Musculoskeletal:        General: No swelling or tenderness. Normal range of motion.     Cervical back: Normal range of motion.  Skin:    Comments: A few scattered abrasions and eccymoses.  Neurological:     Mental Status: He is alert.     Comments: Alert. Fair insight and awareness. Functional memory. Speech dysarthric. Right gaze preference. No focal field cuts. Left central 7 and mild left tongue deviation. LUE 1+ pecs, 2+ biceps, tr finger flexion. LLE 4/5 HF, KE and ADF/PF. Slightly decreased LT on left arm>leg but can sense pain in both. No resting tone. DTR's tr  Psychiatric:        Mood and Affect: Mood normal.        Behavior: Behavior normal.      Lab Results Last 48 Hours        Results for orders placed or performed during the hospital encounter of 01/30/21 (from the past 48 hour(s))  Basic metabolic panel     Status: Abnormal    Collection Time: 02/04/21  6:50 AM  Result Value Ref Range    Sodium 139 135 - 145 mmol/L    Potassium 3.6 3.5 - 5.1 mmol/L    Chloride 111 98 - 111 mmol/L    CO2 22 22 - 32 mmol/L    Glucose, Bld 122 (H) 70 - 99 mg/dL      Comment: Glucose reference range applies only to  samples taken after fasting for at least 8 hours.    BUN 15 8 - 23 mg/dL    Creatinine, Ser 0.83 0.61 - 1.24 mg/dL    Calcium 8.2 (L) 8.9 - 10.3 mg/dL    GFR, Estimated >60 >60 mL/min      Comment: (NOTE) Calculated using the CKD-EPI Creatinine Equation (2021)      Anion gap 6 5 - 15      Comment: Performed at [facility] [address] [place], [place] [address]  CBC     Status: None    Collection Time: 02/04/21  6:50 AM  Result Value Ref Range    WBC 6.7 4.0 - 10.5 K/uL    RBC 4.90 4.22 - 5.81 MIL/uL    Hemoglobin 14.6 13.0 - 17.0 g/dL    HCT 42.0 39.0 - 52.0 %    MCV 85.7 80.0 - 100.0 fL    MCH 29.8 26.0 - 34.0 pg    MCHC 34.8 30.0 - 36.0 g/dL    RDW 13.2 11.5 - 15.5 %    Platelets 185 150 - 400 K/uL    nRBC 0.0 0.0 - 0.2 %      Comment: Performed at [facility], [place] [address], [place], [place] [address]  Magnesium  Status: None    Collection Time: 02/04/21  6:50 AM  Result Value Ref Range    Magnesium 1.9 1.7 - 2.4 mg/dL      Comment: Performed at [facility], [place] [address], [place], [place] [address]  Phosphorus     Status: None    Collection Time: 02/04/21  6:50 AM  Result Value Ref Range    Phosphorus 2.5 2.5 - 4.6 mg/dL      Comment: Performed at [facility] [address], [place], [place] [address]  Glucose, capillary     Status: Abnormal    Collection Time: 02/04/21  7:55 AM  Result Value Ref Range    Glucose-Capillary 112 (H) 70 - 99 mg/dL      Comment: Glucose reference range applies only to samples taken after fasting for at least 8 hours.    Comment 1 Notify RN      Comment 2 Document in Chart    Glucose, capillary     Status: Abnormal    Collection Time: 02/04/21  1:11 PM  Result Value Ref Range    Glucose-Capillary 111 (H) 70 - 99 mg/dL      Comment: Glucose reference range applies only to samples taken after fasting for at least 8 hours.    Comment 1 Notify RN      Comment 2 Document in Chart    Glucose, capillary      Status: Abnormal    Collection Time: 02/04/21  4:07 PM  Result Value Ref Range    Glucose-Capillary 104 (H) 70 - 99 mg/dL      Comment: Glucose reference range applies only to samples taken after fasting for at least 8 hours.    Comment 1 Notify RN      Comment 2 Document in Chart    Magnesium     Status: None    Collection Time: 02/04/21  4:09 PM  Result Value Ref Range    Magnesium 2.0 1.7 - 2.4 mg/dL      Comment: Performed at [facility], [place] [address], [place], [place] [address]  Phosphorus     Status: None    Collection Time: 02/04/21  4:09 PM  Result Value Ref Range    Phosphorus 3.0 2.5 - 4.6 mg/dL      Comment: Performed at [facility] [address] [place], [place] [address]  Glucose, capillary     Status: Abnormal    Collection Time: 02/04/21  7:59 PM  Result Value Ref Range    Glucose-Capillary 111 (H) 70 - 99 mg/dL      Comment: Glucose reference range applies only to samples taken after fasting for at least 8 hours.  Glucose, capillary     Status: None    Collection Time: 02/04/21 11:55 PM  Result Value Ref Range    Glucose-Capillary 99 70 - 99 mg/dL      Comment: Glucose reference range applies only to samples taken after fasting for at least 8 hours.  Glucose, capillary     Status: Abnormal    Collection Time: 02/05/21  3:47 AM  Result Value Ref Range    Glucose-Capillary 118 (H) 70 - 99 mg/dL      Comment: Glucose reference range applies only to samples taken after fasting for at least 8 hours.  Basic metabolic panel     Status: Abnormal    Collection Time: 02/05/21  5:14 AM  Result Value Ref Range  Sodium 139 135 - 145 mmol/L    Potassium 3.6 3.5 - 5.1 mmol/L    Chloride 110 98 - 111 mmol/L    CO2 22 22 - 32 mmol/L    Glucose, Bld 123 (H) 70 - 99 mg/dL      Comment: Glucose reference range applies only to samples taken after fasting for at least 8 hours.    BUN 17 8 - 23 mg/dL    Creatinine, Ser 0.75 0.61 - 1.24 mg/dL    Calcium 8.1  (L) 8.9 - 10.3 mg/dL    GFR, Estimated >60 >60 mL/min      Comment: (NOTE) Calculated using the CKD-EPI Creatinine Equation (2021)      Anion gap 7 5 - 15      Comment: Performed at [facility] [address] [place] [address]  CBC     Status: None    Collection Time: 02/05/21  5:14 AM  Result Value Ref Range    WBC 6.9 4.0 - 10.5 K/uL    RBC 4.86 4.22 - 5.81 MIL/uL    Hemoglobin 14.6 13.0 - 17.0 g/dL    HCT 42.5 39.0 - 52.0 %    MCV 87.4 80.0 - 100.0 fL    MCH 30.0 26.0 - 34.0 pg    MCHC 34.4 30.0 - 36.0 g/dL    RDW 13.4 11.5 - 15.5 %    Platelets 168 150 - 400 K/uL    nRBC 0.0 0.0 - 0.2 %      Comment: Performed at [facility], [place] [address] [place], [place] [address]  Magnesium     Status: None    Collection Time: 02/05/21  5:14 AM  Result Value Ref Range    Magnesium 1.9 1.7 - 2.4 mg/dL      Comment: Performed at [facility] [address], [place], [place] [address]  Phosphorus     Status: None    Collection Time: 02/05/21  5:14 AM  Result Value Ref Range    Phosphorus 3.2 2.5 - 4.6 mg/dL      Comment: Performed at [facility] [address] [place], [place] [address]  Glucose, capillary     Status: Abnormal    Collection Time: 02/05/21  8:05 AM  Result Value Ref Range    Glucose-Capillary 125 (H) 70 - 99 mg/dL      Comment: Glucose reference range applies only to samples taken after fasting for at least 8 hours.  Glucose, capillary     Status: None    Collection Time: 02/05/21 11:17 AM  Result Value Ref Range    Glucose-Capillary 93 70 - 99 mg/dL      Comment: Glucose reference range applies only to samples taken after fasting for at least 8 hours.  Glucose, capillary     Status: None    Collection Time: 02/05/21  5:25 PM  Result Value Ref Range    Glucose-Capillary 85 70 - 99 mg/dL      Comment: Glucose reference range applies only to samples taken after fasting for at least 8 hours.  Glucose, capillary     Status: None     Collection Time: 02/05/21  7:36 PM  Result Value Ref Range    Glucose-Capillary 94 70 - 99 mg/dL      Comment: Glucose reference range applies only to samples taken after fasting for at least 8 hours.  Glucose, capillary     Status: None  Collection Time: 02/05/21 11:57 PM  Result Value Ref Range    Glucose-Capillary 97 70 - 99 mg/dL      Comment: Glucose reference range applies only to samples taken after fasting for at least 8 hours.  Basic metabolic panel     Status: Abnormal    Collection Time: 02/06/21  1:03 AM  Result Value Ref Range    Sodium 139 135 - 145 mmol/L    Potassium 3.9 3.5 - 5.1 mmol/L    Chloride 111 98 - 111 mmol/L    CO2 22 22 - 32 mmol/L    Glucose, Bld 109 (H) 70 - 99 mg/dL      Comment: Glucose reference range applies only to samples taken after fasting for at least 8 hours.    BUN 16 8 - 23 mg/dL    Creatinine, Ser 0.80 0.61 - 1.24 mg/dL    Calcium 8.7 (L) 8.9 - 10.3 mg/dL    GFR, Estimated >60 >60 mL/min      Comment: (NOTE) Calculated using the CKD-EPI Creatinine Equation (2021)      Anion gap 6 5 - 15      Comment: Performed at [facility] [address], [place] [address]  CBC     Status: None    Collection Time: 02/06/21  1:03 AM  Result Value Ref Range    WBC 7.3 4.0 - 10.5 K/uL    RBC 4.93 4.22 - 5.81 MIL/uL    Hemoglobin 14.9 13.0 - 17.0 g/dL    HCT 42.8 39.0 - 52.0 %    MCV 86.8 80.0 - 100.0 fL    MCH 30.2 26.0 - 34.0 pg    MCHC 34.8 30.0 - 36.0 g/dL    RDW 13.4 11.5 - 15.5 %    Platelets 171 150 - 400 K/uL    nRBC 0.0 0.0 - 0.2 %      Comment: Performed at [facility], [place] [address] [place], [place] [address]  Glucose, capillary     Status: Abnormal    Collection Time: 02/06/21  3:28 AM  Result Value Ref Range    Glucose-Capillary 116 (H) 70 - 99 mg/dL      Comment: Glucose reference range applies only to samples taken after fasting for at least 8 hours.      Imaging Results (Last 48 hours)  No results  found.           Medical Problem List and Plan: 1.  Left-sided weakness with dysarthria functional deficits secondary to right lacunar basal ganglia infarct             -patient may shower             -ELOS/Goals: 12-14 days, goals min assist with PT/OT/SLP 2.  Antithrombotics: -DVT/anticoagulation:  Pharmaceutical: Lovenox             -antiplatelet therapy: Aspirin 81 mg daily and Plavix 75 mg daily x3 weeks and aspirin alone 3. Pain Management: Tylenol as needed 4. Mood: Provide emotional support             -antipsychotic agents: N/A 5. Neuropsych: This patient is capable of making decisions on his own behalf. 6. Skin/Wound Care: Routine skin checks 7. Fluids/Electrolytes/Nutrition: Routine in and outs with follow-up chemistries 8.  Dysphagia.  Pt passed MBS today for D3/nectars             -will keep NGT in today. If intake is satisfactory will dc  NGT tomorrow 9.  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  Continue Vimpat 10.  History of alcohol tobacco polysubstance use.  Urine drug screen positive cocaine as well as marijuana.  Provide counseling 11.  Hyperlipidemia.  Lipitor         Lavon Paganini Angiulli, PA-C 02/06/2021   I have personally performed a face to face diagnostic evaluation of this patient and formulated the key components of the plan.  Additionally, I have personally reviewed laboratory data, imaging studies, as well as relevant notes and concur with the physician assistant's documentation above.  The patient's status has not changed from the original H&P.  Any changes in documentation from the acute care chart have been noted above.  Meredith Staggers, MD, [facility]

## 2021-02-06 NOTE — Progress Notes (Signed)
Physical Therapy Treatment Patient Details Name: Douglas Spencer MRN: [id number] DOB: 1946-01-08 Today's Date: 02/06/2021   History of Present Illness 75 y.o. male who presented to the ED on 12/20 secondary to right-sided gaze, left-sided weakness and drift, slurred speech. TNK given. 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 EEG 12/21 was WNL. 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    PT Comments    Pt received in supine, eager to mobilize OOB and with good participation and tolerance for transfer and gait training. Pt able to progress to min/modA +1 for standing/pivotal transfers and modA +1 with RW for forward/backward stepping at bedside ~75ft. Pt given HEP handout for supine exercises and receptive to instruction. He reports only mild to moderate fatigue at end of session. Pt continues to benefit from PT services to progress toward functional mobility goals.    Recommendations for follow up therapy are one component of a multi-disciplinary discharge planning process, led by the attending physician.  Recommendations may be updated based on patient status, additional functional criteria and insurance authorization.  Follow Up Recommendations  Acute inpatient rehab (3hours/day)     Assistance Recommended at Discharge Frequent or constant Supervision/Assistance  Equipment Recommendations  Other (comment) (TBD)    Recommendations for Other Services Rehab consult     Precautions / Restrictions Precautions Precautions: Fall Precaution Comments: L inattention Restrictions Weight Bearing Restrictions: No     Mobility  Bed Mobility Overal bed mobility: Needs Assistance Bed Mobility: Sit to Sidelying   Sidelying to sit: Min assist;HOB elevated       General bed mobility comments: pt with good  initiation to sit up, needs up to minA for safety with anterior scooting    Transfers Overall transfer level: Needs assistance Equipment used: Rolling walker (2 wheels) Transfers: Sit to/from Stand;Bed to chair/wheelchair/BSC Sit to Stand: Mod assist;Min assist Stand pivot transfers: Min assist         General transfer comment: stand pivot with RW to chair on Rt side of bed, needs assist to maintain RUE on RW; pt needs modA to stand from EOB but minA to stand from chair    Ambulation/Gait Ambulation/Gait assistance: Mod assist Gait Distance (Feet): 10 Feet (43ft x2 with seated break between) Assistive device: Rolling walker (2 wheels) Gait Pattern/deviations: Step-to pattern;Decreased step length - left;Decreased dorsiflexion - left;Narrow base of support;Decreased weight shift to left;Shuffle       General Gait Details: pt needing max cues for step sequencing and RW use and needs manual assist at times to advance RW. Pt with narrow BOS and Lt lean that increases with gait distance.   Stairs             Wheelchair Mobility    Modified Rankin (Stroke Patients Only) Modified Rankin (Stroke Patients Only) Pre-Morbid Rankin Score: No symptoms Modified Rankin: Moderately severe disability     Balance Overall balance assessment: Needs assistance Sitting-balance support: Single extremity supported;Feet supported Sitting balance-Leahy Scale: Fair Sitting balance - Comments: cues to maintain midline at times. Close supervision required.   Standing balance support: Single extremity supported Standing balance-Leahy Scale: Poor Standing balance comment: requires cues to maintain midline in standing due to Lt lateral lean with poor carryover  Cognition Arousal/Alertness: Awake/alert Behavior During Therapy: WFL for tasks assessed/performed Overall Cognitive Status: Impaired/Different from baseline Area of Impairment: Following  commands;Safety/judgement;Attention;Problem solving;Awareness;Memory                   Current Attention Level: Selective Memory: Decreased short-term memory Following Commands: Follows one step commands consistently Safety/Judgement: Decreased awareness of safety;Decreased awareness of deficits Awareness: Emergent Problem Solving: Slow processing;Requires verbal cues General Comments: Continued Rt gaze preference but with max cues he was able to look to Lt visual field. Pt reports it was helpful to have his TV somewhat to left side yesterday so he was forced to look more toward Lt. Functional Status Assessment: Patient has had a recent decline in their functional status and demonstrates the ability to make significant improvements in function in a reasonable and predictable amount of time.      Exercises      General Comments General comments (skin integrity, edema, etc.): HR WFL per tele      Pertinent Vitals/Pain Pain Assessment: No/denies pain     PT Goals (current goals can now be found in the care plan section) Acute Rehab PT Goals Patient Stated Goal: To get stronger and walk so I can go home and see my dog. PT Goal Formulation: With patient Time For Goal Achievement: 02/14/21 Progress towards PT goals: Progressing toward goals    Frequency    Min 4X/week      PT Plan Current plan remains appropriate       AM-PAC PT "6 Clicks" Mobility   Outcome Measure  Help needed turning from your back to your side while in a flat bed without using bedrails?: A Little Help needed moving from lying on your back to sitting on the side of a flat bed without using bedrails?: A Lot Help needed moving to and from a bed to a chair (including a wheelchair)?: A Lot Help needed standing up from a chair using your arms (e.g., wheelchair or bedside chair)?: A Lot Help needed to walk in hospital room?: A Lot Help needed climbing 3-5 steps with a railing? : Total 6 Click Score:  12    End of Session Equipment Utilized During Treatment: Gait belt Activity Tolerance: Patient tolerated treatment well Patient left: with call bell/phone within reach;in chair;with chair alarm set (heels floated in recliner) Nurse Communication: Mobility status PT Visit Diagnosis: Unsteadiness on feet (R26.81);Muscle weakness (generalized) (M62.81);Difficulty in walking, not elsewhere classified (R26.2)     Time: 6834-1962 PT Time Calculation (min) (ACUTE ONLY): 37 min  Charges:  $Gait Training: 8-22 mins $Therapeutic Activity: 8-22 mins                     Sejla Marzano P., PTA Acute Rehabilitation Services Pager: [phone] Office: [place] 02/06/2021, 11:38 AM

## 2021-02-06 NOTE — Progress Notes (Signed)
Condom cath output 900cc

## 2021-02-06 NOTE — PMR Pre-admission (Signed)
PMR Admission Coordinator Pre-Admission Assessment  Patient: Douglas Spencer is an 75 y.o., male MRN: [id number] DOB: 04/27/1945 Height: 5' 8"  (172.7 cm) Weight: 59.8 kg  Insurance Information HMO: ye    PPO:      PCP:      IPA:      80/20:      OTHER:  PRIMARY: Douglas Spencer       Policy#: [id number]      Subscriber: Pt. CM Name: Dewitt Rota      Phone#: [phone]     Fax#: [phone] Pre-Cert#: [id number]      Employer:  Benefits:  Phone #:      Name:  Irene Shipper Date: 02/12/2020 - still active  Deductible: $233 ($233 met)  OOP Max: $0  CIR: $1,556 copay/admission  SNF: SNF: $0/day co-pay for days 1-20, $194.50/day co-pay for days 21-100; limited to 100 days/cal yr  Outpatient:  80% coverage; 20% co-insurance  Home Health:  100% coverage  DME: 80% coverage; 20% co-insurance SECONDARY:  Providers: in network  Secondary:  none  Financial Counselor:       Phone#:   [facility] [employer] for patients in Inpatient Rehabilitation Facilities with attached Privacy Act [place] Records was provided and verbally reviewed with: Patient  Emergency Contact Information Contact Information     Name Relation Home Work Mobile   [place] Friend   [phone]       Current Medical History  Patient Admitting Diagnosis: CVA History of Present Illness: Douglas Spencer is a 75 year old right-handed male with history of alcohol use/polysubstance and tobacco use as well as seizure disorder noncompliant with Keppra.  Per chart review lives with a friend.  Two-level home one-step to entry.  Reportedly independent prior to admission.  Presented 01/30/2021 with acute onset of left side weakness as well as dysarthric speech.  Cranial CT scan negative for acute changes.  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  CT angiogram of head and neck no large vessel occlusion.  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  Moderately large territory involved.  No intracranial hemorrhage.  Neurology follow-up patient consented to tNKASE.  Echocardiogram with ejection fraction of 60 to 65% no wall motion abnormalities.  Admission chemistries were unremarkable except sodium 133, urine drug screen positive for marijuana as well as cocaine.  Currently maintained on aspirin 81 mg  daily and Plavix 75 mg daily for CVA prophylaxis x3 weeks then aspirin alone.  Lovenox for DVT prophylaxis.  EEG showed no signs of seizure patient currently maintained on Vimpat.  Currently n.p.o. with alternative means of nutritional support.  Therapy evaluations completed due to patient decreased functional ability left side weakness was admitted for a comprehensive rehab program Complete NIHSS TOTAL: 8  Patient's medical record from [facility] has been reviewed by the rehabilitation admission coordinator and physician.  Past Medical History  No past medical history on file.  Has the patient had major surgery during 100 days prior to admission? No  Family History   family history is not on file.  Current Medications  Current Facility-Administered Medications:    0.9 %  sodium chloride infusion, , Intravenous, Continuous, Rosalin Hawking, MD, Last Rate: 30 mL/hr at 02/05/21 2136, Restarted at 02/05/21 2136   acetaminophen (TYLENOL) tablet 650 mg, 650 mg, Oral, Q4H PRN **OR** acetaminophen (TYLENOL) 160 MG/5ML solution 650 mg, 650 mg, Per Tube, Q4H PRN **OR** acetaminophen (TYLENOL) suppository 650 mg, 650 mg, Rectal, Q4H  PRN, Donnetta Simpers, MD   aspirin chewable tablet 81 mg, 81 mg, Per Tube, Daily, Rosalin Hawking, MD, 81 mg at 02/06/21 1111   atorvastatin (LIPITOR) tablet 40 mg, 40 mg, Per Tube, Daily, Rosalin Hawking, MD, 40 mg at 02/06/21 1111   chlorhexidine (PERIDEX) 0.12 % solution 15 mL, 15 mL, Mouth Rinse, BID, Palikh, Velna Ochs, MD, 15 mL at 02/06/21 1111   Chlorhexidine Gluconate Cloth 2 %  PADS 6 each, 6 each, Topical, Daily, Donnetta Simpers, MD, 6 each at 02/05/21 1019   clopidogrel (PLAVIX) tablet 75 mg, 75 mg, Per Tube, Daily, Rosalin Hawking, MD, 75 mg at 02/06/21 1111   enoxaparin (LOVENOX) injection 40 mg, 40 mg, Subcutaneous, QHS, Khaliqdina, Alferd Patee, MD, 40 mg at 02/05/21 2136   feeding supplement (OSMOLITE 1.5 CAL) liquid 1,000 mL, 1,000 mL, Per Tube, Continuous, de Yolanda Manges, Cortney E, NP, Last Rate: 55 mL/hr at 02/05/21 1223, 1,000 mL at 02/05/21 1223   feeding supplement (PROSource TF) liquid 45 mL, 45 mL, Per Tube, Daily, de Yolanda Manges, [place] E, NP, 45 mL at 02/06/21 1110   hydrOXYzine (ATARAX) tablet 25 mg, 25 mg, Per Tube, Q6H PRN, de Yolanda Manges, Cortney E, NP, 25 mg at 02/05/21 2156   lacosamide (VIMPAT) tablet 50 mg, 50 mg, Per Tube, BID, Rosalin Hawking, MD, 50 mg at 02/06/21 1111   MEDLINE mouth rinse, 15 mL, Mouth Rinse, q12n4p, Palikh, Gaurang M, MD, 15 mL at 02/06/21 1112   pantoprazole sodium (PROTONIX) 40 mg/20 mL oral suspension 40 mg, 40 mg, Per Tube, Daily, de Yolanda Manges, [place] E, NP, 40 mg at 02/06/21 1111   senna-docusate (Senokot-S) tablet 1 tablet, 1 tablet, Per Tube, QHS PRN, de Yolanda Manges, Cortney E, NP   sodium chloride flush (NS) 0.9 % injection 3 mL, 3 mL, Intravenous, Once, Kommor, [place], MD  Patients Current Diet:  Diet Order             Diet vegetarian Room service appropriate? Yes; Fluid consistency: Nectar Thick  Diet effective now                   Precautions / Restrictions Precautions Precautions: Fall Precaution Comments: L inattention Restrictions Weight Bearing Restrictions: No   Has the patient had 2 or more falls or a fall with injury in the past year? No  Prior Activity Level Community (5-7x/wk): Pt. went out daily  Prior Functional Level Self Care: Did the patient need help bathing, dressing, using the toilet or eating? Independent  Indoor Mobility: Did the patient need assistance with walking from room to room (with or  without device)? Independent  Stairs: Did the patient need assistance with internal or external stairs (with or without device)? Independent  Functional Cognition: Did the patient need help planning regular tasks such as shopping or remembering to take medications? Independent  Patient Information Are you of Hispanic, Latino/a,or Spanish origin?: A. No, not of Hispanic, Latino/a, or Spanish origin What is your race?: A. White Do you need or want an interpreter to communicate with a doctor or health care staff?: 0. No  Patient's Response To:  Health Literacy and Transportation Is the patient able to respond to health literacy and transportation needs?: Yes Health Literacy - How often do you need to have someone help you when you read instructions, pamphlets, or other written material from your doctor or pharmacy?: Never In the past 12 months, has lack of transportation kept you from medical appointments or from getting medications?: No In  the past 12 months, has lack of transportation kept you from meetings, work, or from getting things needed for daily living?: No  Development worker, international aid / Equipment    Prior Device Use: Indicate devices/aids used by the patient prior to current illness, exacerbation or injury? None of the above  Current Functional Level Cognition  Arousal/Alertness: Lethargic Overall Cognitive Status: Impaired/Different from baseline Difficult to assess due to: Level of arousal Current Attention Level: Sustained Orientation Level: Oriented X4 Following Commands: Follows one step commands inconsistently Safety/Judgement: Decreased awareness of safety, Decreased awareness of deficits General Comments: Continued Rt gaze preference but with max cues he was able to look to Lt visual field. Pt had been up in chair >5 hours but was reluctant to get back to bed. Attention: Focused, Sustained Focused Attention: Impaired Focused Attention Impairment: Verbal  complex Sustained Attention: Impaired Sustained Attention Impairment: Verbal complex Memory: Impaired Memory Impairment: Retrieval deficit, Decreased recall of new information (Immediate: 5/5 with repetition; delayed: 1/5; with cues: 1/4) Awareness: Impaired Awareness Impairment: Intellectual impairment Problem Solving: Impaired Problem Solving Impairment: Verbal complex Executive Function: Sequencing, Organizing Sequencing: Impaired Sequencing Impairment: Verbal complex (Clock drawing: 0/4) Organizing: Impaired Organizing Impairment: Verbal complex (backward digit span: 0/2)    Extremity Assessment (includes Sensation/Coordination)  Upper Extremity Assessment: LUE deficits/detail LUE Deficits / Details: no trace activation noted in L hand, minimal movement in flexion at elbow and shoulder shrug LUE Sensation: WNL LUE Coordination: decreased fine motor, decreased gross motor  Lower Extremity Assessment: Defer to PT evaluation LLE Deficits / Details: grossly 2-/5. Full assessment limited by lethargy LLE Sensation: WNL LLE Coordination: decreased fine motor, decreased gross motor    ADLs  Overall ADL's : Needs assistance/impaired Grooming: Bed level, Minimal assistance Upper Body Bathing: Maximal assistance, Bed level Upper Body Dressing : Moderate assistance, Sitting Upper Body Dressing Details (indicate cue type and reason): repetition of putting LUE into UB clothing first, max A intially but transitioned on mod A with practice Lower Body Dressing: Total assistance, Bed level Toilet Transfer: Moderate assistance, +2 for physical assistance, +2 for safety/equipment, Rolling walker (2 wheels) Toilet Transfer Details (indicate cue type and reason): RW with L forearm extension Functional mobility during ADLs: Moderate assistance, +2 for safety/equipment, +2 for physical assistance General ADL Comments: pt did well with attending to L environment given verbal cues. ambulated ~41f with  mod A+2    Mobility  Overal bed mobility: Needs Assistance Bed Mobility: Sit to Sidelying Rolling: Min assist Sidelying to sit: Min assist, +2 for safety/equipment Supine to sit: Mod assist Sit to sidelying: Mod assist General bed mobility comments: pt instructed on hooking "good" Rt leg under LLE to assist with return to supine but still needs modA for BLE assist and minA for rolling/repositioning in bed.    Transfers  Overall transfer level: Needs assistance Equipment used: 1 person hand held assist Transfers: Sit to/from Stand, Bed to chair/wheelchair/BSC Sit to Stand: Mod assist Bed to/from chair/wheelchair/BSC transfer type:: Stand pivot Stand pivot transfers: Mod assist Step pivot transfers: Mod assist, +2 physical assistance Transfer via Lift Equipment: Stedy General transfer comment: from chair via face to face technique pt stands and assisted to pivot back to bed via stronger Rt side    Ambulation / Gait / Stairs / Wheelchair Mobility  Ambulation/Gait Ambulation/Gait assistance: Mod assist, +2 physical assistance Gait Distance (Feet): 10 Feet Assistive device: Left platform walker Gait Pattern/deviations: Step-to pattern, Decreased step length - left, Decreased dorsiflexion - left, Drifts right/left, Narrow base of  support General Gait Details: pt needing max cues for step sequencing and L PFRW use and needs manual assist at times to advance RW. Pt with narrow BOS and Lt lean that increases with gait distance.    Posture / Balance Dynamic Sitting Balance Sitting balance - Comments: cues to maintain midline at times. Close supervision required. Balance Overall balance assessment: Needs assistance Sitting-balance support: Single extremity supported, Feet supported Sitting balance-Leahy Scale: Fair Sitting balance - Comments: cues to maintain midline at times. Close supervision required. Postural control: Left lateral lean Standing balance support: Single extremity  supported Standing balance-Leahy Scale: Poor Standing balance comment: requires cues to maintain midline in standing due to Lt lateral lean with poor carryover    Special needs/care consideration Special service needs none   Previous Home Environment (from acute therapy documentation) Living Arrangements: Spouse/significant other  Lives With: Significant other Available Help at Discharge: Family Type of Home: House Home Layout: Two level Alternate Level Stairs-Number of Steps: flight Home Access: Stairs to enter Technical brewer of Steps: 1 Bathroom Shower/Tub: Multimedia programmer: Standard Bathroom Accessibility: Yes How Accessible: Accessible via walker Home Care Services: No Additional Comments: information limited by patient's lethargy  Discharge Living Setting Plans for Discharge Living Setting: Patient's home Type of Home at Discharge: House Discharge Home Layout: Two level, Able to live on main level with bedroom/bathroom Alternate Level Stairs-Number of Steps: flight Discharge Home Access: Stairs to enter Entrance Stairs-Rails: None Entrance Stairs-Number of Steps: 1 Discharge Bathroom Shower/Tub: Tub/shower unit, Walk-in shower Discharge Bathroom Toilet: Standard Discharge Bathroom Accessibility: Yes How Accessible: Accessible via walker, Accessible via wheelchair Does the patient have any problems obtaining your medications?: No  Social/Family/Support Systems Patient Roles: Partner, Other (Comment) Contact Information: Maeola Harman Anticipated Caregiver: [phone] Ability/Limitations of Caregiver: Min A Caregiver Availability: 24/7 Discharge Plan Discussed with Primary Caregiver: Yes Is Caregiver In Agreement with Plan?: Yes Does Caregiver/Family have Issues with Lodging/Transportation while Pt is in Rehab?: No  Goals Patient/Family Goal for Rehab: PT/OT/SLP Min A Expected length of stay: 12-14 days Pt/Family Agrees to Admission and willing  to participate: Yes Program Orientation Provided & Reviewed with Pt/Caregiver Including Roles  & Responsibilities: Yes  Decrease burden of Care through IP rehab admission: Specialzed equipment needs, Decrease number of caregivers, Bowel and bladder program, and Patient/family education  Possible need for SNF placement upon discharge: not anticipated  Patient Condition: I have reviewed medical records from [facility], spoken with CSW, and patient. I met with patient at the bedside for inpatient rehabilitation assessment.  Patient will benefit from ongoing PT, OT, and SLP, can actively participate in 3 hours of therapy a day 5 days of the week, and can make measurable gains during the admission.  Patient will also benefit from the coordinated team approach during an Inpatient Acute Rehabilitation admission.  The patient will receive intensive therapy as well as Rehabilitation physician, nursing, social worker, and care management interventions.  Due to safety, skin/wound care, disease management, medication administration, pain management, and patient education the patient requires 24 hour a day rehabilitation nursing.  The patient is currently mod A with mobility and basic ADLs.  Discharge setting and therapy post discharge at home with home health is anticipated.  Patient has agreed to participate in the Acute Inpatient Rehabilitation Program and will admit today.  Preadmission Screen Completed By:  Genella Mech, 02/06/2021 11:22 AM ______________________________________________________________________   Discussed status with Dr. Naaman Plummer on 02/06/21 at 66 and received approval for admission  today.  Admission Coordinator:  Genella Mech, CCC-SLP, time 1130/Date 02/06/21   Assessment/Plan: Diagnosis: right lenticulostriate infarct Does the need for close, 24 hr/day Medical supervision in concert with the patient's rehab needs make it unreasonable for this patient to be served in a  less intensive setting? Yes Co-Morbidities requiring supervision/potential complications: etoh and drug abuse. Htn, sz disorder Due to bladder management, bowel management, safety, skin/wound care, disease management, medication administration, pain management, and patient education, does the patient require 24 hr/day rehab nursing? Yes Does the patient require coordinated care of a physician, rehab nurse, PT, OT, and SLP to address physical and functional deficits in the context of the above medical diagnosis(es)? Yes Addressing deficits in the following areas: balance, endurance, locomotion, strength, transferring, bowel/bladder control, bathing, dressing, feeding, grooming, toileting, cognition, speech, swallowing, and psychosocial support Can the patient actively participate in an intensive therapy program of at least 3 hrs of therapy 5 days a week? Yes The potential for patient to make measurable gains while on inpatient rehab is excellent Anticipated functional outcomes upon discharge from inpatient rehab: supervision and min assist PT, supervision and min assist OT, supervision SLP Estimated rehab length of stay to reach the above functional goals is: 12-14 days Anticipated discharge destination: Home 10. Overall Rehab/Functional Prognosis: excellent   MD Signature: Meredith Staggers, MD, [place] Director Rehabilitation Services 02/06/2021

## 2021-02-06 NOTE — Progress Notes (Signed)
Modified Barium Swallow Progress Note  Patient Details  Name: Douglas Spencer MRN: [id number] Date of Birth: 06-09-1945  Today's Date: 02/06/2021  Modified Barium Swallow completed.  Full report located under Chart Review in the Imaging Section.  Brief recommendations include the following:  Clinical Impression  Pt demonstrates moderate oropharyngeal dysphagia with instance of trace sensed aspiration during the swallow with thin and nectar thick liquids with larger boluses, likely due to decreased laryngeal closure. Pt is able to achieve early and firm laryngeal closure with cues to "get ready to swallow" with nectar and thin liquids. Pt also with mild base of tongue weakness and reduced epiglottic deflection leading to mild residue with solids. Pt able to clear most penetrate/aspirate and residue with a throat clear and second swallow. Recommend a dys 2, vegetarian diet with nectar thick liquids. Pt is motivated to eat and following instructions well . Suggest removal of NG tube which may improve swallowing. SLP will f/u for further swallowing interventions.   Swallow Evaluation Recommendations       SLP Diet Recommendations: Dysphagia 2 (Fine chop) solids;Nectar thick liquid   Liquid Administration via: Cup;Straw   Medication Administration: Whole meds with puree   Supervision: Patient able to self feed;Staff to assist with self feeding   Compensations: Slow rate;Small sips/bites;Follow solids with liquid;Clear throat intermittently;Effortful swallow;Multiple dry swallows after each bite/sip       Oral Care Recommendations: Oral care BID        Joleigh Mineau, Katherene Ponto 02/06/2021,9:55 AM

## 2021-02-07 DIAGNOSIS — G40909 Epilepsy, unspecified, not intractable, without status epilepticus: Secondary | ICD-10-CM | POA: Diagnosis not present

## 2021-02-07 DIAGNOSIS — F1910 Other psychoactive substance abuse, uncomplicated: Secondary | ICD-10-CM | POA: Diagnosis not present

## 2021-02-07 DIAGNOSIS — I639 Cerebral infarction, unspecified: Secondary | ICD-10-CM | POA: Diagnosis not present

## 2021-02-07 DIAGNOSIS — I69322 Dysarthria following cerebral infarction: Secondary | ICD-10-CM | POA: Diagnosis not present

## 2021-02-07 LAB — COMPREHENSIVE METABOLIC PANEL
ALT: 45 U/L — ABNORMAL HIGH (ref 0–44)
AST: 29 U/L (ref 15–41)
Albumin: 3.4 g/dL — ABNORMAL LOW (ref 3.5–5.0)
Alkaline Phosphatase: 49 U/L (ref 38–126)
Anion gap: 9 (ref 5–15)
BUN: 18 mg/dL (ref 8–23)
CO2: 24 mmol/L (ref 22–32)
Calcium: 8.9 mg/dL (ref 8.9–10.3)
Chloride: 105 mmol/L (ref 98–111)
Creatinine, Ser: 0.89 mg/dL (ref 0.61–1.24)
GFR, Estimated: >60 mL/min (ref >60)
Glucose, Bld: 98 mg/dL (ref 70–99)
Potassium: 4.1 mmol/L (ref 3.5–5.1)
Sodium: 138 mmol/L (ref 135–145)
Total Bilirubin: 0.8 mg/dL (ref 0.3–1.2)
Total Protein: 6.3 g/dL — ABNORMAL LOW (ref 6.5–8.1)

## 2021-02-07 LAB — CBC WITH DIFFERENTIAL/PLATELET
Abs Immature Granulocytes: 0.03 10*3/uL (ref 0.00–0.07)
Basophils Absolute: 0.1 10*3/uL (ref 0.0–0.1)
Basophils Relative: 1 %
Eosinophils Absolute: 0.3 10*3/uL (ref 0.0–0.5)
Eosinophils Relative: 3 %
HCT: 46 % (ref 39.0–52.0)
Hemoglobin: 16.1 g/dL (ref 13.0–17.0)
Immature Granulocytes: 0 %
Lymphocytes Relative: 22 %
Lymphs Abs: 2.2 10*3/uL (ref 0.7–4.0)
MCH: 30 pg (ref 26.0–34.0)
MCHC: 35 g/dL (ref 30.0–36.0)
MCV: 85.7 fL (ref 80.0–100.0)
Monocytes Absolute: 1 10*3/uL (ref 0.1–1.0)
Monocytes Relative: 10 %
Neutro Abs: 6.4 10*3/uL (ref 1.7–7.7)
Neutrophils Relative %: 64 %
Platelets: 191 10*3/uL (ref 150–400)
RBC: 5.37 MIL/uL (ref 4.22–5.81)
RDW: 13.3 % (ref 11.5–15.5)
WBC: 9.8 10*3/uL (ref 4.0–10.5)
nRBC: 0 % (ref 0.0–0.2)

## 2021-02-07 LAB — GLUCOSE, CAPILLARY
Glucose-Capillary: 89 mg/dL (ref 70–99)
Glucose-Capillary: 96 mg/dL (ref 70–99)
Glucose-Capillary: 119 mg/dL — ABNORMAL HIGH (ref 70–99)

## 2021-02-07 LAB — PHOSPHORUS: Phosphorus: 3.7 mg/dL (ref 2.5–4.6)

## 2021-02-07 LAB — MAGNESIUM: Magnesium: 2.2 mg/dL (ref 1.7–2.4)

## 2021-02-07 MED ORDER — OSMOLITE 1.5 CAL PO LIQD
1000.0000 mL | ORAL | Status: DC
Start: 2021-02-07 — End: 2021-02-08
  Administered 2021-02-07: 18:00:00 1000 mL
  Filled 2021-02-07: qty 1000

## 2021-02-07 MED ORDER — PROSOURCE TF PO LIQD
45.0000 mL | Freq: Every day | ORAL | Status: DC
  Administered 2021-02-07 – 2021-02-09 (×3): 45 mL
  Filled 2021-02-07 (×3): qty 45

## 2021-02-07 MED ORDER — FREE WATER
150.0000 mL | Status: DC
  Administered 2021-02-07 – 2021-02-11 (×20): 150 mL

## 2021-02-07 MED ORDER — ENSURE MAX PROTEIN PO LIQD
11.0000 [oz_av] | Freq: Every day | ORAL | Status: DC
  Administered 2021-02-07: 10:00:00 11 [oz_av] via ORAL

## 2021-02-07 MED ORDER — SORBITOL 70 % SOLN
30.0000 mL | Freq: Every day | Status: DC | PRN
  Administered 2021-02-07 – 2021-02-10 (×2): 30 mL via ORAL
  Filled 2021-02-07 (×3): qty 30

## 2021-02-07 NOTE — Progress Notes (Addendum)
PMR Admission Coordinator Pre-Admission Assessment   Patient: Douglas Spencer is an 75 y.o., male MRN: [id number] DOB: 1945/08/18 Height: _0  (172.7 cm) Weight: 59.8 kg   Insurance Information HMO: ye    PPO:      PCP:      IPA:      80/20:      OTHER:  PRIMARY: Ricard Dillon       Policy#: [id number]      Subscriber: Pt. CM Name: Dewitt Rota      Phone#: [phone]     Fax#: [phone] Pre-Cert#: [id number]      Employer:  Benefits:  Phone #:      Name:  Irene Shipper Date: 02/12/2020 - still active  Deductible: $233 ($233 met)  OOP Max: $0  CIR: $1,556 copay/admission  SNF: SNF: $0/day co-pay for days 1-20, $194.50/day co-pay for days 21-100; limited to 100 days/cal yr  Outpatient:  80% coverage; 20% co-insurance  Home Health:  100% coverage  DME: 80% coverage; 20% co-insurance  Providers: in network   Secondary:  Medicaid [employer]. Policy # [id number]   Financial Counselor:       Phone#:    The Data Collection Information Summary for patients in Inpatient Rehabilitation Facilities with attached Privacy Act [place] Records was provided and verbally reviewed with: Patient   Emergency Contact Information Contact Information       Name Relation Home Work Mobile    [place] Friend     [phone]           Current Medical History  Patient Admitting Diagnosis: CVA History of Present Illness: Douglas Spencer is a 75 year old right-handed male with history of alcohol use/polysubstance and tobacco use as well as seizure disorder noncompliant with Keppra.  Per chart review lives with a friend.  Two-level home one-step to entry.  Reportedly independent prior to admission.  Presented 01/30/2021 with acute onset of left side weakness as well as dysarthric speech.  Cranial CT scan negative for acute changes.  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  CT angiogram of head and neck no large  vessel occlusion.  MRI showed acute infarct right lenticulostriate territory.  Moderately large territory involved.  No intracranial hemorrhage.  Neurology follow-up patient consented to tNKASE.  Echocardiogram with ejection fraction of 60 to 65% no wall motion abnormalities.  Admission chemistries were unremarkable except sodium 133, urine drug screen positive for marijuana as well as cocaine.  Currently maintained on aspirin 81 mg  daily and Plavix 75 mg daily for CVA prophylaxis x3 weeks then aspirin alone.  Lovenox for DVT prophylaxis.  EEG showed no signs of seizure patient currently maintained on Vimpat.  Currently n.p.o. with alternative means of nutritional support.  Therapy evaluations completed due to patient decreased functional ability left side weakness was admitted for a comprehensive rehab program Complete NIHSS TOTAL: 8   Patient's medical record from [facility] has been reviewed by the rehabilitation admission coordinator and physician.   Past Medical History  No past medical history on file.   Has the patient had major surgery during 100 days prior to admission? No   Family History   family history is not on file.   Current Medications   Current Facility-Administered Medications:    0.9 %  sodium chloride infusion, , Intravenous, Continuous, Rosalin Hawking, MD, Last Rate: 30 mL/hr at 02/05/21 2136, Restarted at 02/05/21 2136   acetaminophen (TYLENOL) tablet 650 mg, 650 mg, Oral, Q4H  PRN **OR** acetaminophen (TYLENOL) 160 MG/5ML solution 650 mg, 650 mg, Per Tube, Q4H PRN **OR** acetaminophen (TYLENOL) suppository 650 mg, 650 mg, Rectal, Q4H PRN, Donnetta Simpers, MD   aspirin chewable tablet 81 mg, 81 mg, Per Tube, Daily, Rosalin Hawking, MD, 81 mg at 02/06/21 1111   atorvastatin (LIPITOR) tablet 40 mg, 40 mg, Per Tube, Daily, Rosalin Hawking, MD, 40 mg at 02/06/21 1111   chlorhexidine (PERIDEX) 0.12 % solution 15 mL, 15 mL, Mouth Rinse, BID, Palikh, Velna Ochs, MD, 15 mL  at 02/06/21 1111   Chlorhexidine Gluconate Cloth 2 % PADS 6 each, 6 each, Topical, Daily, Donnetta Simpers, MD, 6 each at 02/05/21 1019   clopidogrel (PLAVIX) tablet 75 mg, 75 mg, Per Tube, Daily, Rosalin Hawking, MD, 75 mg at 02/06/21 1111   enoxaparin (LOVENOX) injection 40 mg, 40 mg, Subcutaneous, QHS, Khaliqdina, Alferd Patee, MD, 40 mg at 02/05/21 2136   feeding supplement (OSMOLITE 1.5 CAL) liquid 1,000 mL, 1,000 mL, Per Tube, Continuous, de Yolanda Manges, Cortney E, NP, Last Rate: 55 mL/hr at 02/05/21 1223, 1,000 mL at 02/05/21 1223   feeding supplement (PROSource TF) liquid 45 mL, 45 mL, Per Tube, Daily, de Yolanda Manges, Elroy E, NP, 45 mL at 02/06/21 1110   hydrOXYzine (ATARAX) tablet 25 mg, 25 mg, Per Tube, Q6H PRN, de Yolanda Manges, Cortney E, NP, 25 mg at 02/05/21 2156   lacosamide (VIMPAT) tablet 50 mg, 50 mg, Per Tube, BID, Rosalin Hawking, MD, 50 mg at 02/06/21 1111   MEDLINE mouth rinse, 15 mL, Mouth Rinse, q12n4p, Palikh, Gaurang M, MD, 15 mL at 02/06/21 1112   pantoprazole sodium (PROTONIX) 40 mg/20 mL oral suspension 40 mg, 40 mg, Per Tube, Daily, de Yolanda Manges, Norman E, NP, 40 mg at 02/06/21 1111   senna-docusate (Senokot-S) tablet 1 tablet, 1 tablet, Per Tube, QHS PRN, de Yolanda Manges, Cortney E, NP   sodium chloride flush (NS) 0.9 % injection 3 mL, 3 mL, Intravenous, Once, Kommor, Madison, MD   Patients Current Diet:  Diet Order                  Diet vegetarian Room service appropriate? Yes; Fluid consistency: Nectar Thick  Diet effective now                         Precautions / Restrictions Precautions Precautions: Fall Precaution Comments: L inattention Restrictions Weight Bearing Restrictions: No    Has the patient had 2 or more falls or a fall with injury in the past year? No   Prior Activity Level Community (5-7x/wk): Pt. went out daily   Prior Functional Level Self Care: Did the patient need help bathing, dressing, using the toilet or eating? Independent   Indoor Mobility:  Did the patient need assistance with walking from room to room (with or without device)? Independent   Stairs: Did the patient need assistance with internal or external stairs (with or without device)? Independent   Functional Cognition: Did the patient need help planning regular tasks such as shopping or remembering to take medications? Independent   Patient Information Are you of Hispanic, Latino/a,or Spanish origin?: A. No, not of Hispanic, Latino/a, or Spanish origin What is your race?: A. White Do you need or want an interpreter to communicate with a doctor or health care staff?: 0. No   Patient's Response To:  Health Literacy and Transportation Is the patient able to respond to health literacy and transportation needs?: Yes Health Literacy -  How often do you need to have someone help you when you read instructions, pamphlets, or other written material from your doctor or pharmacy?: Never In the past 12 months, has lack of transportation kept you from medical appointments or from getting medications?: No In the past 12 months, has lack of transportation kept you from meetings, work, or from getting things needed for daily living?: No   Home Assistive Devices / Equipment   Prior Device Use: Indicate devices/aids used by the patient prior to current illness, exacerbation or injury? None of the above   Current Functional Level Cognition   Arousal/Alertness: Lethargic Overall Cognitive Status: Impaired/Different from baseline Difficult to assess due to: Level of arousal Current Attention Level: Sustained Orientation Level: Oriented X4 Following Commands: Follows one step commands inconsistently Safety/Judgement: Decreased awareness of safety, Decreased awareness of deficits General Comments: Continued Rt gaze preference but with max cues he was able to look to Lt visual field. Pt had been up in chair >5 hours but was reluctant to get back to bed. Attention: Focused,  Sustained Focused Attention: Impaired Focused Attention Impairment: Verbal complex Sustained Attention: Impaired Sustained Attention Impairment: Verbal complex Memory: Impaired Memory Impairment: Retrieval deficit, Decreased recall of new information (Immediate: 5/5 with repetition; delayed: 1/5; with cues: 1/4) Awareness: Impaired Awareness Impairment: Intellectual impairment Problem Solving: Impaired Problem Solving Impairment: Verbal complex Executive Function: Sequencing, Organizing Sequencing: Impaired Sequencing Impairment: Verbal complex (Clock drawing: 0/4) Organizing: Impaired Organizing Impairment: Verbal complex (backward digit span: 0/2)    Extremity Assessment (includes Sensation/Coordination)   Upper Extremity Assessment: LUE deficits/detail LUE Deficits / Details: no trace activation noted in L hand, minimal movement in flexion at elbow and shoulder shrug LUE Sensation: WNL LUE Coordination: decreased fine motor, decreased gross motor  Lower Extremity Assessment: Defer to PT evaluation LLE Deficits / Details: grossly 2-/5. Full assessment limited by lethargy LLE Sensation: WNL LLE Coordination: decreased fine motor, decreased gross motor     ADLs   Overall ADL's : Needs assistance/impaired Grooming: Bed level, Minimal assistance Upper Body Bathing: Maximal assistance, Bed level Upper Body Dressing : Moderate assistance, Sitting Upper Body Dressing Details (indicate cue type and reason): repetition of putting LUE into UB clothing first, max A intially but transitioned on mod A with practice Lower Body Dressing: Total assistance, Bed level Toilet Transfer: Moderate assistance, +2 for physical assistance, +2 for safety/equipment, Rolling walker (2 wheels) Toilet Transfer Details (indicate cue type and reason): RW with L forearm extension Functional mobility during ADLs: Moderate assistance, +2 for safety/equipment, +2 for physical assistance General ADL Comments: pt  did well with attending to L environment given verbal cues. ambulated ~84f with mod A+2     Mobility   Overal bed mobility: Needs Assistance Bed Mobility: Sit to Sidelying Rolling: Min assist Sidelying to sit: Min assist, +2 for safety/equipment Supine to sit: Mod assist Sit to sidelying: Mod assist General bed mobility comments: pt instructed on hooking "good" Rt leg under LLE to assist with return to supine but still needs modA for BLE assist and minA for rolling/repositioning in bed.     Transfers   Overall transfer level: Needs assistance Equipment used: 1 person hand held assist Transfers: Sit to/from Stand, Bed to chair/wheelchair/BSC Sit to Stand: Mod assist Bed to/from chair/wheelchair/BSC transfer type:: Stand pivot Stand pivot transfers: Mod assist Step pivot transfers: Mod assist, +2 physical assistance Transfer via Lift Equipment: Stedy General transfer comment: from chair via face to face technique pt stands and assisted to pivot back  to bed via stronger Rt side     Ambulation / Gait / Stairs / Wheelchair Mobility   Ambulation/Gait Ambulation/Gait assistance: Mod assist, +2 physical assistance Gait Distance (Feet): 10 Feet Assistive device: Left platform walker Gait Pattern/deviations: Step-to pattern, Decreased step length - left, Decreased dorsiflexion - left, Drifts right/left, Narrow base of support General Gait Details: pt needing max cues for step sequencing and L PFRW use and needs manual assist at times to advance RW. Pt with narrow BOS and Lt lean that increases with gait distance.     Posture / Balance Dynamic Sitting Balance Sitting balance - Comments: cues to maintain midline at times. Close supervision required. Balance Overall balance assessment: Needs assistance Sitting-balance support: Single extremity supported, Feet supported Sitting balance-Leahy Scale: Fair Sitting balance - Comments: cues to maintain midline at times. Close supervision  required. Postural control: Left lateral lean Standing balance support: Single extremity supported Standing balance-Leahy Scale: Poor Standing balance comment: requires cues to maintain midline in standing due to Lt lateral lean with poor carryover     Special needs/care consideration Special service needs none    Previous Home Environment (from acute therapy documentation) Living Arrangements: Spouse/significant other  Lives With: Significant other Available Help at Discharge: Family Type of Home: House Home Layout: Two level Alternate Level Stairs-Number of Steps: flight Home Access: Stairs to enter Technical brewer of Steps: 1 Bathroom Shower/Tub: Multimedia programmer: Standard Bathroom Accessibility: Yes How Accessible: Accessible via walker Home Care Services: No Additional Comments: information limited by patient's lethargy   Discharge Living Setting Plans for Discharge Living Setting: Patient's home Type of Home at Discharge: House Discharge Home Layout: Two level, Able to live on main level with bedroom/bathroom Alternate Level Stairs-Number of Steps: flight Discharge Home Access: Stairs to enter Entrance Stairs-Rails: None Entrance Stairs-Number of Steps: 1 Discharge Bathroom Shower/Tub: Tub/shower unit, Walk-in shower Discharge Bathroom Toilet: Standard Discharge Bathroom Accessibility: Yes How Accessible: Accessible via walker, Accessible via wheelchair Does the patient have any problems obtaining your medications?: No   Social/Family/Support Systems Patient Roles: Partner, Other (Comment) Contact Information: Maeola Harman Anticipated Caregiver: [phone] Ability/Limitations of Caregiver: Min A Caregiver Availability: 24/7 Discharge Plan Discussed with Primary Caregiver: Yes Is Caregiver In Agreement with Plan?: Yes Does Caregiver/Family have Issues with Lodging/Transportation while Pt is in Rehab?: No   Goals Patient/Family Goal for Rehab:  PT/OT/SLP Min A Expected length of stay: 12-14 days Pt/Family Agrees to Admission and willing to participate: Yes Program Orientation Provided & Reviewed with Pt/Caregiver Including Roles  & Responsibilities: Yes   Decrease burden of Care through IP rehab admission: Specialzed equipment needs, Decrease number of caregivers, Bowel and bladder program, and Patient/family education   Possible need for SNF placement upon discharge: not anticipated   Patient Condition: I have reviewed medical records from [facility], spoken with CSW, and patient. I met with patient at the bedside for inpatient rehabilitation assessment.  Patient will benefit from ongoing PT, OT, and SLP, can actively participate in 3 hours of therapy a day 5 days of the week, and can make measurable gains during the admission.  Patient will also benefit from the coordinated team approach during an Inpatient Acute Rehabilitation admission.  The patient will receive intensive therapy as well as Rehabilitation physician, nursing, social worker, and care management interventions.  Due to safety, skin/wound care, disease management, medication administration, pain management, and patient education the patient requires 24 hour a day rehabilitation nursing.  The patient is currently mod  A with mobility and basic ADLs.  Discharge setting and therapy post discharge at home with home health is anticipated.  Patient has agreed to participate in the Acute Inpatient Rehabilitation Program and will admit today.   Preadmission Screen Completed By:  Genella Mech, 02/06/2021 11:22 AM ______________________________________________________________________   Discussed status with Dr. Naaman Plummer on 02/06/21 at 6 and received approval for admission today.   Admission Coordinator:  Genella Mech, CCC-SLP, time 1130/Date 02/06/21    Assessment/Plan: Diagnosis: right lenticulostriate infarct Does the need for close, 24 hr/day Medical  supervision in concert with the patient's rehab needs make it unreasonable for this patient to be served in a less intensive setting? Yes Co-Morbidities requiring supervision/potential complications: etoh and drug abuse. Htn, sz disorder Due to bladder management, bowel management, safety, skin/wound care, disease management, medication administration, pain management, and patient education, does the patient require 24 hr/day rehab nursing? Yes Does the patient require coordinated care of a physician, rehab nurse, PT, OT, and SLP to address physical and functional deficits in the context of the above medical diagnosis(es)? Yes Addressing deficits in the following areas: balance, endurance, locomotion, strength, transferring, bowel/bladder control, bathing, dressing, feeding, grooming, toileting, cognition, speech, swallowing, and psychosocial support Can the patient actively participate in an intensive therapy program of at least 3 hrs of therapy 5 days a week? Yes The potential for patient to make measurable gains while on inpatient rehab is excellent Anticipated functional outcomes upon discharge from inpatient rehab: supervision and min assist PT, supervision and min assist OT, supervision SLP Estimated rehab length of stay to reach the above functional goals is: 12-14 days Anticipated discharge destination: Home 10. Overall Rehab/Functional Prognosis: excellent     MD Signature: Meredith Staggers, MD, [place] Director Rehabilitation Services 02/06/2021

## 2021-02-07 NOTE — Progress Notes (Signed)
PROGRESS NOTE   Subjective/Complaints: Pt reports no problems last night. Breakfast put in front of him. I asked him if he ate and he said he did  ROS: Limited due to cognitive/behavioral    Objective:   DG Swallowing Func-Speech Pathology  Result Date: 02/06/2021 Table formatting from the original result was not included. Objective Swallowing Evaluation: Type of Study: MBS-Modified Barium Swallow Study  Patient Details Name: Douglas Spencer MRN: [id number] Date of Birth: April 27, 1945 Today's Date: 02/06/2021 Time: SLP Start Time (ACUTE ONLY): 0820 -SLP Stop Time (ACUTE ONLY): 0847 SLP Time Calculation (min) (ACUTE ONLY): 27 min Past Medical History: No past medical history on file. Past Surgical History: No past surgical history on file. HPI: Pt is a 75 y.o. male who presented to the ED secondary to right-sided gaze, left-sided weakness and drift, slurred speech. TNK given. 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 EEG 12/21,12/22 were WNL. 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  Subjective: [birth year] [age] [birth year]  Recommendations for follow up therapy are one component of a multi-disciplinary discharge planning process, led by the attending physician.  Recommendations may be updated based on patient status, additional functional criteria and insurance authorization. Assessment / Plan / Recommendation Clinical Impressions 02/06/2021 Clinical Impression Pt demonstrates moderate oropharyngeal dysphagia with instance of trace sensed aspiration during the swallow with thin and nectar thick liquids with larger boluses, likely due to decreased laryngeal closure. Pt is able to achieve early and firm laryngeal closure with cues to "get ready to swallow" with nectar and thin liquids.  Pt also with mild base of tongue weakness and reduced epiglottic deflection leading to mild residue with solids. Pt able to clear most penetrate/aspirate and residue with a throat clear and second swallow. Recommend a dys 2, vegetarian diet with nectar thick liquids. Pt is motivated to eat and following instructions well . Suggest removal of NG tube which may improve swallowing. SLP will f/u for further swallowing interventions. SLP Visit Diagnosis Dysphagia, oropharyngeal phase (R13.12) Attention and concentration deficit following -- Frontal lobe and executive function deficit following -- Impact on safety and function Moderate aspiration risk   Treatment Recommendations 02/06/2021 Treatment Recommendations Therapy as outlined in treatment plan below   Prognosis 02/06/2021 Prognosis for Safe Diet Advancement Good Barriers to Reach Goals -- Barriers/Prognosis Comment -- Diet Recommendations 02/06/2021 SLP Diet Recommendations Dysphagia 2 (Fine chop) solids;Nectar thick liquid Liquid Administration via Cup;Straw Medication Administration Whole meds with puree Compensations Slow rate;Small sips/bites;Follow solids with liquid;Clear throat intermittently;Effortful swallow;Multiple dry swallows after each bite/sip Postural Changes --   Other Recommendations 02/06/2021 Recommended Consults -- Oral Care Recommendations Oral care BID Other Recommendations -- Follow Up Recommendations Acute inpatient rehab (3hours/day) Assistance recommended at discharge Frequent or constant Supervision/Assistance Functional Status Assessment Patient has had a recent decline in their functional status and demonstrates the ability to make significant improvements in function in a reasonable and predictable amount of time. Frequency and Duration  02/06/2021 Speech Therapy Frequency (ACUTE ONLY) min 2x/week Treatment Duration --   Oral Phase 02/06/2021  Oral Phase Impaired Oral - Pudding Teaspoon -- Oral - Pudding Cup -- Oral - Honey Teaspoon  -- Oral - Honey Cup -- Oral - Nectar Teaspoon Lingual/palatal residue Oral - Nectar Cup Lingual/palatal residue Oral - Nectar Straw Lingual/palatal residue Oral - Thin Teaspoon -- Oral - Thin Cup Lingual/palatal residue Oral - Thin Straw Lingual/palatal residue Oral - Puree Lingual/palatal residue Oral - Mech Soft Lingual/palatal residue Oral - Regular -- Oral - Multi-Consistency -- Oral - Pill Lingual/palatal residue Oral Phase - Comment --  Pharyngeal Phase 02/06/2021 Pharyngeal Phase Impaired Pharyngeal- Pudding Teaspoon -- Pharyngeal -- Pharyngeal- Pudding Cup -- Pharyngeal -- Pharyngeal- Honey Teaspoon -- Pharyngeal -- Pharyngeal- Honey Cup -- Pharyngeal -- Pharyngeal- Nectar Teaspoon Reduced epiglottic inversion;Reduced tongue base retraction;Reduced airway/laryngeal closure;Pharyngeal residue - valleculae Pharyngeal -- Pharyngeal- Nectar Cup Reduced epiglottic inversion;Reduced tongue base retraction;Reduced airway/laryngeal closure;Penetration/Aspiration during swallow;Trace aspiration;Pharyngeal residue - valleculae Pharyngeal Material enters airway, passes BELOW cords then ejected out;Material enters airway, passes BELOW cords and not ejected out despite cough attempt by patient;Material does not enter airway Pharyngeal- Nectar Straw Reduced epiglottic inversion;Reduced tongue base retraction;Pharyngeal residue - valleculae Pharyngeal -- Pharyngeal- Thin Teaspoon -- Pharyngeal -- Pharyngeal- Thin Cup Penetration/Aspiration during swallow;Penetration/Aspiration before swallow;Reduced epiglottic inversion;Reduced tongue base retraction;Reduced airway/laryngeal closure;Pharyngeal residue - valleculae;Trace aspiration Pharyngeal Material enters airway, passes BELOW cords then ejected out;Material enters airway, passes BELOW cords and not ejected out despite cough attempt by patient;Material does not enter airway Pharyngeal- Thin Straw Penetration/Aspiration during swallow;Penetration/Aspiration before  swallow;Reduced epiglottic inversion;Reduced tongue base retraction;Reduced airway/laryngeal closure;Pharyngeal residue - valleculae;Trace aspiration Pharyngeal Material enters airway, passes BELOW cords then ejected out;Material enters airway, passes BELOW cords and not ejected out despite cough attempt by patient;Material does not enter airway Pharyngeal- Puree Reduced epiglottic inversion;Reduced tongue base retraction;Pharyngeal residue - valleculae Pharyngeal -- Pharyngeal- Mechanical Soft Reduced epiglottic inversion;Reduced tongue base retraction;Pharyngeal residue - valleculae Pharyngeal -- Pharyngeal- Regular -- Pharyngeal -- Pharyngeal- Multi-consistency -- Pharyngeal -- Pharyngeal- Pill -- Pharyngeal -- Pharyngeal Comment --  No flowsheet data found. Lynann Beaver 02/06/2021, 9:56 AM                     Recent Labs    02/06/21 0103 02/07/21 0510  WBC 7.3 9.8  HGB 14.9 16.1  HCT 42.8 46.0  PLT 171 191   Recent Labs    02/06/21 0103 02/07/21 0510  NA 139 138  K 3.9 4.1  CL 111 105  CO2 22 24  GLUCOSE 109* 98  BUN 16 18  CREATININE 0.80 0.89  CALCIUM 8.7* 8.9    Intake/Output Summary (Last 24 hours) at 02/07/2021 8811 Last data filed at 02/07/2021 0500 Gross per 24 hour  Intake 528.08 ml  Output 302 ml  Net 226.08 ml        Physical Exam: Vital Signs Blood pressure 134/75, pulse 70, temperature 98.7 F (37.1 C), temperature source Oral, resp. rate 18, height 5\' 8"  (1.727 m), weight 56.8 kg, SpO2 95 %.  General: Alert and oriented x 3, No apparent distress HEENT: Head is normocephalic, atraumatic, PERRLA, EOMI, sclera anicteric, oral mucosa pink and moist, dentition intact, ext ear canals clear, NGT Neck: Supple without JVD or lymphadenopathy Heart: Reg rate and rhythm. No murmurs rubs or gallops Chest: CTA bilaterally without wheezes, rales, or rhonchi; no distress Abdomen: Soft, non-tender, non-distended, bowel sounds positive. Extremities: No  clubbing, cyanosis, or edema. Pulses are 2+ Psych: Pt's affect is appropriate. Pt is cooperative Skin: Clean and intact without signs of breakdown  Neuro:  pt alert, dysarthric. Fair awareness and memory. Impaired insight. Right gaze preference but can be cued to left. Left central 7 and tongue deviation. LUE 1+ pec, 2+ biceps, trace hand. LLE 4/5 prox to distal. Decreased LT left arm>leg.  Musculoskeletal: no focal pain, normal rom    Assessment/Plan: 1. Functional deficits which require 3+ hours per day of interdisciplinary therapy in a comprehensive inpatient rehab setting. Physiatrist is providing close team supervision and 24 hour management of active medical problems listed below. Physiatrist and rehab team continue to assess barriers to discharge/monitor patient progress toward functional and medical goals  Care Tool:  Bathing              Bathing assist       Upper Body Dressing/Undressing Upper body dressing        Upper body assist      Lower Body Dressing/Undressing Lower body dressing            Lower body assist       Toileting Toileting    Toileting assist       Transfers Chair/bed transfer  Transfers assist           Locomotion Ambulation   Ambulation assist              Walk 10 feet activity   Assist           Walk 50 feet activity   Assist           Walk 150 feet activity   Assist           Walk 10 feet on uneven surface  activity   Assist           Wheelchair     Assist               Wheelchair 50 feet with 2 turns activity    Assist            Wheelchair 150 feet activity     Assist          Blood pressure 134/75, pulse 70, temperature 98.7 F (37.1 C), temperature source Oral, resp. rate 18, height 5\' 8"  (1.727 m), weight 56.8 kg, SpO2 95 %.  Medical Problem List and Plan: 1.  Left-sided weakness with dysarthria functional deficits secondary to right  lacunar basal ganglia infarct             -patient may shower             -ELOS/Goals: 12-14 days, goals min assist with PT/OT/SLP  -Patient is beginning CIR therapies today including PT, OT, and SLP  2.  Antithrombotics: -DVT/anticoagulation:  Pharmaceutical: Lovenox             -antiplatelet therapy: Aspirin 81 mg daily and Plavix 75 mg daily x3 weeks and aspirin alone 3. Pain Management: Tylenol as needed 4. Mood: Provide emotional support             -antipsychotic agents: N/A 5. Neuropsych: This patient is capable of making decisions on his own behalf. 6. Skin/Wound Care: Routine skin checks 7. Fluids/Electrolytes/Nutrition: encourage PO  -I personally reviewed the patient's labs today.  Wnl  -continue NGT until eating enough (0nly 0-35% currently) 8.  Dysphagia.  Pt passed MBS today for D3/nectars             -tolerating, just needs to eat more 9.  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  Continue Vimpat 10.  History of  alcohol tobacco polysubstance use.  Urine drug screen positive cocaine as well as marijuana.  Provide counseling as appropriate 11.  Hyperlipidemia.  Lipitor    LOS: 1 days A FACE TO FACE EVALUATION WAS PERFORMED  Meredith Staggers 02/07/2021, 9:21 AM

## 2021-02-07 NOTE — Progress Notes (Signed)
Patient ID: Douglas Spencer, male   DOB: 04/04/1945, 75 y.o.   MRN: [id number] ° °Met with patient in room, introduced myself, and explained my roll in his care. Gave him additional education handouts on Stroke Prevention, High Cholesterol, Substance Use Disorder, Alcohol Use Disorder, Tobacco Use Disorder, Smoking Tobacco Information Adult, Steps to Quit Smoking, and Managing the Challenge of Quitting Smoking. Patient was very interested in handouts and stated he was going to start reading. Patient asked if he had been given his anti-anxiety medication because if he had, it wasn't working. I checked the MAR and found that it had not but I would notify his nurse that he was requesting it. He was grateful for my assistance. When I left the room I went to the nurses station and informed Autumn Jones, LPN and she would notify his nurse who was at lunch. Will continue to monitor patient's progress. ° ° , RN3, BSN, CBIS, CRRN, WTA °Care Coordinator, Inpatient Rehabilitation °Office [phone] °Cell [phone]  °

## 2021-02-07 NOTE — Evaluation (Signed)
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 No pertinent surgical history.  Assessment & Plan Clinical Impression: Patient is a 75 y.o. year old right-handed male with history of alcohol use/polysubstance and tobacco use as well as seizure disorder noncompliant with Keppra.  Per chart review lives with a friend.  Two-level home one-step to entry.  Reportedly independent prior to admission.  Presented 01/30/2021 with acute onset of left side weakness as well as dysarthric speech.  Cranial CT scan negative for acute changes.  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  CT angiogram of head and neck no large vessel occlusion.  MRI showed acute infarct right lenticulostriate territory.  Moderately large territory involved.  No intracranial hemorrhage.  Neurology follow-up patient consented to tNKASE.  Echocardiogram with ejection fraction of 60 to 65% no wall motion abnormalities.  Admission chemistries were unremarkable except sodium 133, urine drug screen positive for marijuana as well as cocaine.  Currently maintained on aspirin 81 mg  daily and Plavix 75 mg daily for CVA prophylaxis x3 weeks then aspirin alone.  Lovenox for DVT prophylaxis.  EEG showed no signs of seizure patient currently maintained on Vimpat.  Currently n.p.o. with  alternative means of nutritional support.  Therapy evaluations completed due to patient decreased functional ability left side weakness was admitted for a comprehensive rehab program. Patient transferred to [facility] on 02/06/2021 .   Patient currently requires min with mobility secondary to muscle weakness, decreased cardiorespiratoy endurance, abnormal tone and decreased coordination, decreased visual perceptual skills and decreased visual motor skills, decreased midline orientation and decreased attention to left, decreased attention, decreased awareness, decreased problem solving, decreased safety awareness, and decreased memory, and decreased sitting balance, decreased standing balance, decreased postural control, hemiplegia, and decreased balance strategies.  Prior to hospitalization, patient was independent  with mobility and lived with Significant other in a House home.  Home access is 1Stairs to enter.  Patient will benefit from skilled PT intervention to maximize safe functional mobility, minimize fall risk, and decrease caregiver burden for planned discharge home with intermittent assist.  Anticipate patient will benefit from follow up OP at discharge.  PT - End of Session Activity Tolerance: Tolerates 30+ min activity with multiple rests Endurance Deficit: Yes Endurance Deficit Description: Quick to fatigue PT Assessment Rehab Potential (ACUTE/IP ONLY): Good PT Barriers to Discharge: Home environment access/layout;Incontinence;Behavior PT Patient demonstrates impairments in the following area(s): Balance;Perception;Safety;Behavior;Edema;Sensory;Skin Integrity;Endurance;Motor;Nutrition;Pain PT Transfers Functional Problem(s): Bed Mobility;Bed to Chair;Car;Furniture PT Locomotion Functional Problem(s): Ambulation;Wheelchair Mobility;Stairs PT Plan PT Intensity: Minimum of 1-2 x/day ,45 to 90 minutes PT Frequency: 5 out of 7 days PT Duration Estimated Length of Stay: 12-14 days PT  Treatment/Interventions: Ambulation/gait training;Cognitive remediation/compensation;Discharge planning;DME/adaptive equipment instruction;Functional mobility training;Pain management;Psychosocial support;Splinting/orthotics;UE/LE Strength taining/ROM;Therapeutic Activities;Visual/perceptual remediation/compensation;Wheelchair propulsion/positioning;UE/LE Coordination activities;Therapeutic Exercise;Stair training;Skin care/wound management;Patient/family education;Neuromuscular re-education;Functional electrical stimulation;Disease management/prevention;Community reintegration;Balance/vestibular training PT Transfers Anticipated Outcome(s): mod I using LRAD PT Locomotion Anticipated Outcome(s): Supervision using  LRAD PT Recommendation Recommendations for Other Services: Neuropsych consult (reporting increased depression/anxiety since CVA) Follow Up Recommendations: Outpatient PT Patient destination: Home Equipment Recommended: To be determined   PT Evaluation Precautions/Restrictions Precautions Precautions: Fall Precaution Comments: L inattention Restrictions Weight Bearing Restrictions: No Pain Interference Pain Interference Pain Effect on Sleep: 0. Does not apply - I have not had any pain or hurting in the past 5 days Pain Interference with Therapy Activities: 0. Does not apply - I have not received rehabilitationtherapy in the past 5 days Pain Interference with Day-to-Day Activities: 1. Rarely or not at all Home Living/Prior [place] Available Help at Discharge: Family Type of Home: House Home Access: Stairs to enter Technical brewer of Steps: 1 Home Layout: Two level (split level) Alternate Level Stairs-Number of Steps: 6, per patient Alternate Level Stairs-Rails: Left Bathroom Shower/Tub: Multimedia programmer: Standard Bathroom Accessibility: Yes Additional Comments: information limited by patient's lethargy  Lives With: Significant  other Prior Function Level of Independence: Independent with basic ADLs;Independent with gait;Independent with transfers  Able to Take Stairs?: Yes Driving: No Vocation: Retired Radiographer, therapeutic - History Ability to See in Adequate Light: 0 Adequate Vision - Assessment Eye Alignment: Impaired (comment) Ocular Range of Motion: Restricted on the left Alignment/Gaze Preference: Gaze right Tracking/Visual Pursuits: Requires cues, head turns, or add eye shifts to track;Decreased smoothness of vertical tracking;Impaired - to be further tested in functional context Saccades: Additional head turns occurred during testing;Decreased speed of saccadic movement Convergence: Impaired (comment) Perception Perception: Impaired Inattention/Neglect: Does not attend to left visual field;Does not attend to left side of body Praxis Praxis: Intact  Cognition Overall Cognitive Status: Impaired/Different from baseline Arousal/Alertness: Awake/alert Orientation Level: Oriented X4 Year:  (2002) Month: December Day of Week: Correct Attention: Sustained Focused Attention: Impaired Focused Attention Impairment: Verbal complex Sustained Attention: Impaired Sustained Attention Impairment: Functional basic;Verbal basic Memory: Impaired Memory Impairment: Retrieval deficit;Decreased recall of new information Immediate Memory Recall: Sock;Blue;Bed Memory Recall Sock: Not able to recall Memory Recall Blue: Without Cue Memory Recall Bed: Without Cue Awareness: Impaired Awareness Impairment: Intellectual impairment Problem Solving: Impaired Problem Solving Impairment: Functional basic Sequencing: Impaired Sequencing Impairment: Verbal complex Organizing: Impaired Organizing Impairment: Verbal complex Safety/Judgment: Impaired Comments: Decreased knowledge of current deficits and decreased awareness of need for external assist. Sensation Sensation Light Touch: Appears Intact Hot/Cold:  Impaired by gross assessment Proprioception: Impaired by gross assessment (Impaired in L upper/lower extremity) Coordination Gross Motor Movements are Fluid and Coordinated: No Fine Motor Movements are Fluid and Coordinated: No Coordination and Movement Description: L hemiparesis, L inattention Finger Nose Finger Test: WFL on R; L hemiparesis Heel Shin Test: unable on L, WNL on R Motor  Motor Motor: Hemiplegia;Abnormal postural alignment and control Motor - Skilled Clinical Observations: L hemi   Trunk/Postural Assessment  Cervical Assessment Cervical Assessment: Exceptions to [facility] (Forward head) Thoracic Assessment Thoracic Assessment: Exceptions to [facility] (Rounded shoulders; kyphotoc) Lumbar Assessment Lumbar Assessment: Exceptions to [facility] (Posterior pelvic tilt) Postural Control Postural Control: Deficits on evaluation (Delayed; insufficient)  Balance Balance Balance Assessed: Yes Standardized Balance Assessment Standardized Balance Assessment: Berg Balance Test Berg Balance Test Sit to Stand: Needs minimal aid to stand or to stabilize Standing Unsupported: Unable to stand 30 seconds unassisted Sitting with Back Unsupported but Feet Supported on Floor or Stool: Able to sit 30 seconds Stand to Sit: Needs assistance to sit Transfers: Needs one person to assist Standing Unsupported with Eyes Closed: Needs help to keep from falling Standing Ubsupported with Feet  Together: Needs help to attain position and unable to hold for 15 seconds From Standing, Reach Forward with Outstretched Arm: Loses balance while trying/requires external support From Standing Position, Pick up Object from Floor: Unable to try/needs assist to keep balance From Standing Position, Turn to Look Behind Over each Shoulder: Needs assist to keep from losing balance and falling Turn 360 Degrees: Needs assistance while turning Standing Unsupported, Alternately Place Feet on Step/Stool: Needs assistance to keep from  falling or unable to try Standing Unsupported, One Foot in Front: Loses balance while stepping or standing Standing on One Leg: Unable to try or needs assist to prevent fall Total Score: 4 Static Sitting Balance Static Sitting - Balance Support: Right upper extremity supported;No upper extremity supported;Feet supported Static Sitting - Level of Assistance: 5: Stand by assistance Dynamic Sitting Balance Dynamic Sitting - Balance Support: Right upper extremity supported;No upper extremity supported;Feet supported Dynamic Sitting - Level of Assistance: 4: Min assist Sitting balance - Comments: R lateral lean, tactile cues for orienation to midline Static Standing Balance Static Standing - Balance Support: Bilateral upper extremity supported;During functional activity Static Standing - Level of Assistance: 4: Min assist Dynamic Standing Balance Dynamic Standing - Balance Support: Bilateral upper extremity supported;During functional activity Dynamic Standing - Level of Assistance: 3: Mod assist;4: Min assist Extremity Assessment  RLE Assessment RLE Assessment: Within Functional Limits Active Range of Motion (AROM) Comments: grossly WFL with functional mobility General Strength Comments: Grossly 4+ to 5/5 throughout in sitting LLE Assessment LLE Assessment: Exceptions to [facility] Active Range of Motion (AROM) Comments: Grossly WFL with functional mobility, except decreased knee extension in sitting General Strength Comments: Grossly 4- to 4/5 throughout in sitting  Care Tool Care Tool Bed Mobility Roll left and right activity   Roll left and right assist level: Minimal Assistance - Patient > 75%    Sit to lying activity   Sit to lying assist level: Minimal Assistance - Patient > 75%    Lying to sitting on side of bed activity   Lying to sitting on side of bed assist level: the ability to move from lying on the back to sitting on the side of the bed with no back support.: Minimal Assistance  - Patient > 75%     Care Tool Transfers Sit to stand transfer   Sit to stand assist level: Minimal Assistance - Patient > 75%    Chair/bed transfer   Chair/bed transfer assist level: Minimal Assistance - Patient > 75%     Toilet transfer   Assist Level: Moderate Assistance - Patient 50 - 74%    Car transfer   Car transfer assist level: Moderate Assistance - Patient 50 - 74%      Care Tool Locomotion Ambulation   Assist level: Minimal Assistance - Patient > 75% Assistive device: No Device Max distance: 36 ft  Walk 10 feet activity   Assist level: Minimal Assistance - Patient > 75% Assistive device: No Device   Walk 50 feet with 2 turns activity Walk 50 feet with 2 turns activity did not occur: Safety/medical concerns      Walk 150 feet activity Walk 150 feet activity did not occur: Safety/medical concerns      Walk 10 feet on uneven surfaces activity Walk 10 feet on uneven surfaces activity did not occur: Safety/medical concerns      Stairs   Assist level: Minimal Assistance - Patient > 75% Stairs assistive device: 1 hand rail Max number of stairs: 4  Walk  up/down 1 step activity   Walk up/down 1 step (curb) assist level: Minimal Assistance - Patient > 75% Walk up/down 1 step or curb assistive device: 1 hand rail  Walk up/down 4 steps activity   Walk up/down 4 steps assist level: Minimal Assistance - Patient > 75% Walk up/down 4 steps assistive device: 1 hand rail  Walk up/down 12 steps activity Walk up/down 12 steps activity did not occur: Safety/medical concerns      Pick up small objects from floor Pick up small object from the floor (from standing position) activity did not occur: Safety/medical concerns      Wheelchair Is the patient using a wheelchair?: Yes Type of Wheelchair: Manual   Wheelchair assist level: Minimal Assistance - Patient > 75% Max wheelchair distance: 52 ft  Wheel 50 feet with 2 turns activity   Assist Level: Minimal Assistance - Patient  > 75%  Wheel 150 feet activity   Assist Level: Total Assistance - Patient < 25%    Refer to Care Plan for Long Term Goals  SHORT TERM GOAL WEEK 1 PT Short Term Goal 1 (Week 1): Patient will perborm basic transfers with CGA using LRAD. PT Short Term Goal 2 (Week 1): Patient will ambulate >100 feet with CGA using LRAD. PT Short Term Goal 3 (Week 1): Patient will ascend/descend 6 steps with L rail with min A-CGA simulating home set-up. PT Short Term Goal 4 (Week 1): Patient will improve Berg Balance Scale score by MDC (7 points)  Recommendations for other services: Neuropsych  Skilled Therapeutic Intervention Evaluation completed (see details above and below) with education on PT POC and goals and individual treatment initiated with focus on functional mobility/transfers, LE strength, dynamic standing balance/coordination, ambulation, stair navigation, simulated car transfers, and improved endurance with activity Patient provided with 16"x16" manual wheelchair with foam cushion and adjustments made to promote optimal seating posture and pressure distribution. Patient also provided with RW for use in room and therapist adjusted to proper height for patient.  Patient in recliner in the room upon PT arrival. Patient alert and agreeable to PT session. Patient denied pain during session. Patient reporting increased anxiety and depression due to loss of functional independence and asking if he received his medications to manage this today, RN made aware with plans to follow-up with patient. Provided education on coping strategies and stroke recovery throughout session with improved anxiety per patient report.  Patient verbose with difficulty attending to tasks due to internal distraction. Also, had difficulty maintaining secretions with poor awareness of when he was drooling. Provided a wash cloth and max cues for management throughout session.   Therapeutic Activity: Bed Mobility: Patient performed  rolling R/L and supine to/from sit with min A on a mat table.  Transfers: Patient performed stand pivot recliner>w/c and sit to/from stand x2 with min A and toilet transfer with min-mod A performing stand pivot. Provided multimodal cues for initiation with L stepping, sequencing, and R hand placement for steadying support on arm rest or grab bar. Patient performed a simulated mid-size SUV height car transfer with min-mod A without and AD. Provided cues for safe technique, and sitting posture due to L lean. Patient was continent of bladder during toileting. Performed lower body clothing management with mod A on L side and hand hygiene seated in w/c with min cues for locating soap and paper towels.  Gait Training:  Patient ambulated 36 feet without AD with min A and 15 feet using RW with min-mod A, increased challenge  with L hand grip and L inattention with use of AD. Ambulated with decreased gait speed, decreased step length and height L>R, increased B hip and knee flexion in stance L>R, forward trunk lean, and downward head gaze. Provided verbal cues for erect posture, looking ahead, and facilitated weight shifting and use of AD. Patient ascended/descended 4x6" steps using R rail with min A. Performed reciprocal gait pattern while ascending and step-to gait pattern while descending. Provided cues for technique and sequencing.   Wheelchair Mobility:  Patient propelled wheelchair 52 feet, self-selected to propel with B lower extremities, with min A for initiation and steering. Provided verbal cues for steering technique and turns.   Neuromuscular Re-ed: Patient performed the following balance assessment: Patient demonstrates increased fall risk as noted by score of  4/56 on Berg Balance Scale.  (<36= high risk for falls, close to 100%; 37-45 significant >80%; 46-51 moderate >50%; 52-55 lower >25%)  Instructed pt in results of PT evaluation as detailed above, PT POC, rehab potential, rehab goals, and  discharge recommendations. Additionally discussed CIR's policies regarding fall safety and use of chair alarm and/or quick release belt. Pt verbalized understanding and in agreement. Will update pt's family members as they become available.   Patient in w/c in the room at end of session with breaks locked, seat belt alarm set, and all needs within reach.    Discharge Criteria: Patient will be discharged from PT if patient refuses treatment 3 consecutive times without medical reason, if treatment goals not met, if there is a change in medical status, if patient makes no progress towards goals or if patient is discharged from hospital.  The above assessment, treatment plan, treatment alternatives and goals were discussed and mutually agreed upon: by patient  Doreene Burke PT, DPT  02/07/2021, 12:51 PM

## 2021-02-07 NOTE — Progress Notes (Addendum)
Occupational Therapy Session Note  Patient Details  Name: Douglas Spencer MRN: [id number] Date of Birth: Jul 07, 1945  Today's Date: 02/07/2021 OT Individual Time: 1401-1434 OT Individual Time Calculation (min): 33 min    Short Term Goals: Week 1:  OT Short Term Goal 1 (Week 1): Patient will don UB clothing with Min A and good carryover of hemi technique. OT Short Term Goal 2 (Week 1): Patient will complete 2/3 parts of toileting task with Min A. OT Short Term Goal 3 (Week 1): Patient will locate 3 ADL items on L side of sink surface with supervision A and good carryover of compensatory visual techniques. OT Short Term Goal 4 (Week 1): Patient will complete toilet transfer with Min A and LRAD.  Skilled Therapeutic Interventions/Progress Updates:  Patient met seated in wc in agreement with OT treatment session. 0/10 pain reported at rest and with activity. Patient continues to express disdain about his mech soft diet (now NPO) and presence of [employer]. Extensive time spent explaining reason for dysphagia diet (would benefit from continued education 2/2 poor insight). Patient also with difficulty managing oral secretions with large amount of drool and food from lunch meal present on clothing. UB dressing to doff soiled shirt and don new shirt with Mod A and repeat reminders for hemi technique. Partner [name] called while OT present in room. [name] reports he the patient reside in a private residence with level entry but 4 STE (L rail) after front door. [name] also reports having a standard height commode and tub/shower combo with grab bars and curtain. Patient does not have any DME. Education provided to [name], patient CLOF and goals. [name] expressed verbal understanding. Session concluded with patient seated in wc with call bell within reach, belt alarm activated and all needs met.    Therapy Documentation Precautions:  Precautions Precautions: Fall Precaution Comments: L  inattention Restrictions Weight Bearing Restrictions: No General:  Therapy/Group: Individual Therapy  Julieta Rogalski R [place] 02/07/2021, 12:12 PM

## 2021-02-07 NOTE — Evaluation (Signed)
Speech Language Pathology Assessment and Plan  Patient Details  Name: Douglas Spencer MRN: [id number] Date of Birth: Mar 01, 1945  SLP Diagnosis: Dysphagia;Cognitive Impairments;Dysarthria  Rehab Potential: Good ELOS: 12-14 days   Today's Date: 02/07/2021 SLP Individual Time: 1300-1400 SLP Individual Time Calculation (min): 60 min  Hospital Problem: Principal Problem:   Infarction of right basal ganglia (HCC)  Past Medical History:  Past Medical History:  Diagnosis Date   ETOH abuse    Substance abuse ([place])    Past Surgical History: History reviewed. No pertinent surgical history.  Assessment / Plan / Recommendation Clinical Impression  Patient is a 75 y.o. year old right-handed male with history of alcohol use/polysubstance and tobacco use as well as seizure disorder noncompliant with Keppra.  Per chart review lives with a friend.  Two-level home one-step to entry.  Reportedly independent prior to admission.  Presented 01/30/2021 with acute onset of left side weakness as well as dysarthric speech.  Cranial CT scan negative for acute changes.  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  CT angiogram of head and neck no large vessel occlusion.  MRI showed acute infarct right lenticulostriate territory.  Moderately large territory involved.  No intracranial hemorrhage.  Neurology follow-up patient consented to tNKASE.  Echocardiogram with ejection fraction of 60 to 65% no wall motion abnormalities.  Admission chemistries were unremarkable except sodium 133, urine drug screen positive for marijuana as well as cocaine.  Currently maintained on aspirin 81 mg  daily and Plavix 75 mg daily for CVA prophylaxis x3 weeks then aspirin alone.  Lovenox for DVT prophylaxis.  EEG showed no signs of seizure patient currently maintained on Vimpat.  Currently n.p.o. with alternative means of nutritional support.  Therapy evaluations completed  due to patient decreased functional ability left side weakness was admitted for a comprehensive rehab program. Patient transferred to CIR on 02/06/2021  Pt presents with moderately severe oropharyngeal dysphagia at this time. Pt demonstrates poor ability to manage secretions this date, severe anterior loss of saliva and wet vocal quality throughout assessment.Pt seen consuming Dys 2/NTL lunch meal with consistent and overt s/s aspiration across Dys 2, puree, NTL and HTL consistencies. Pt demonstrating apparently delayed swallow initiation with all intake, sluggish hyolaryngeal excursion and multiple attempts to swallow small boluses. Pt requiring max cues to attempt to clear wet vocal quality (throat clear, cough, reswallow) which was only effective ~50% of attempts. Most recent MBS 12/27 recommended Dys 2/NTL with cues to "get ready to swallow", however overt clinical s/s aspiration today result in recommendation for downgrade to NPO status with re-initiation of tube feedings via NG tube. Communicated this information to PA, RN and NT.  Pt with very poor awareness of swallow impairments at this time and anterior loss of saliva/food/liquid despite max A. Pt wants to eat sandwiches and cheese despite extensive education on current swallow impairments and risk of aspiration. Pt frequently states "I'll walk out of here, I'll plan my escape" when discussing nutrition recommendations at this time.  Informal cognitive assessment not completed due to time constraints however pt demonstrates mild-mod cognitive impairment in areas of insight, awareness, attention, problem solving and recall. Previous SLUMS in acute was 9/30 on 02/01/21. Pt largest barrier during evaluation was impulsivity and poor insight into deficits. Pt will benefit from skilled ST in CIR to promote safety and independence with daily routine and determine safest and least restrictive diet.    Skilled Therapeutic Interventions  Pt  participating in Bedside Swallow Evaluation and non-standardized assessments of speech, language, and cognitive function. Please see above.    SLP Assessment  Patient will need skilled Speech Lanaguage Pathology Services during CIR admission    Recommendations  Recommended Consults: Consider GI evaluation SLP Diet Recommendations: Alternative means - temporary Medication Administration: Via alternative means Oral Care Recommendations: Oral care QID Recommendations for Other Services: Neuropsych consult Patient destination: Home Follow up Recommendations: Home Health SLP;Outpatient SLP Equipment Recommended: None recommended by SLP    SLP Frequency 3 to 5 out of 7 days   SLP Duration  SLP Intensity  SLP Treatment/Interventions 12-14 days  Minumum of 1-2 x/day, 30 to 90 minutes  Cognitive remediation/compensation;Cueing hierarchy;Dysphagia/aspiration precaution training;Functional tasks;Patient/family education;Therapeutic Activities;Therapeutic Exercise;Speech/Language facilitation;Oral motor exercises;Internal/external aids    Pain Pain Assessment Pain Scale: 0-10 Pain Score: 0-No pain  Prior Functioning Cognitive/Linguistic Baseline: Within functional limits Type of Home: House  Lives With: Significant other Available Help at Discharge: Family Vocation: Retired  SLP Evaluation Cognition Overall Cognitive Status: Impaired/Different from baseline Arousal/Alertness: Awake/alert Orientation Level: Oriented X4 Year: 2022 Month: December Day of Week: Correct Attention: Sustained Focused Attention: Impaired Focused Attention Impairment: Verbal basic Sustained Attention: Impaired Sustained Attention Impairment: Functional basic;Verbal basic Memory: Impaired Memory Impairment: Retrieval deficit;Decreased recall of new information Immediate Memory Recall: Sock;Blue;Bed Memory Recall Sock: Not able to recall Memory Recall Blue: Without Cue Memory Recall Bed: Without  Cue Awareness: Impaired Awareness Impairment: Intellectual impairment Problem Solving: Impaired Problem Solving Impairment: Functional basic;Verbal basic Executive Function: Sequencing;Organizing Sequencing: Impaired Sequencing Impairment: Verbal complex Organizing: Impaired Organizing Impairment: Verbal complex Safety/Judgment: Impaired Comments: severely impaired safety and insight into swallow deficits  Comprehension Auditory Comprehension Overall Auditory Comprehension: Appears within functional limits for tasks assessed Expression Expression Primary Mode of Expression: Verbal Verbal Expression Overall Verbal Expression: Appears within functional limits for tasks assessed Oral Motor Oral Motor/Sensory Function Overall Oral Motor/Sensory Function: Moderate impairment Facial ROM: Reduced left;Suspected CN VII (facial) dysfunction Facial Symmetry: Abnormal symmetry left;Suspected CN VII (facial) dysfunction Facial Strength: Reduced left;Suspected CN VII (facial) dysfunction Facial Sensation: Within Functional Limits Lingual ROM: Reduced right;Reduced left Lingual Symmetry: Within Functional Limits Lingual Strength: Within Functional Limits Lingual Sensation: Within Functional Limits Velum: Within Functional Limits Mandible: Within Functional Limits Motor Speech Overall Motor Speech: Impaired Phonation: Low vocal intensity Articulation: Impaired Level of Impairment: Conversation Intelligibility: Intelligibility reduced Motor Planning: Witnin functional limits  Care Tool Care Tool Cognition Ability to hear (with hearing aid or hearing appliances if normally used Ability to hear (with hearing aid or hearing appliances if normally used): 0. Adequate - no difficulty in normal conservation, social interaction, listening to TV   Expression of Ideas and Wants Expression of Ideas and Wants: 4. Without difficulty (complex and basic) - expresses complex messages without difficulty  and with speech that is clear and easy to understand   Understanding Verbal and Non-Verbal Content Understanding Verbal and Non-Verbal Content: 3. Usually understands - understands most conversations, but misses some part/intent of message. Requires cues at times to understand  Memory/Recall Ability Memory/Recall Ability : Current season;Location of own room;That he or she is in a hospital/hospital unit   Intelligibility: Intelligibility reduced  Bedside Swallowing Assessment General Date of Onset: 01/30/21 Previous Swallow Assessment: none Diet Prior to this Study: Dysphagia 2 (chopped);Thin liquids Temperature Spikes Noted: No Respiratory Status: Room air History of Recent Intubation: No Behavior/Cognition: Cooperative;Pleasant mood;Distractible Oral Cavity - Dentition: Dentures, top;Dentures, bottom Self-Feeding Abilities: Able to feed self Patient Positioning: Upright in  chair/Tumbleform Baseline Vocal Quality: Hoarse Volitional Cough: Wet Volitional Swallow: Able to elicit   Ice Chips Ice chips: Not tested Thin Liquid Thin Liquid: Not tested Nectar Thick Nectar Thick Liquid: Impaired Presentation: Cup Oral Phase Impairments: Poor awareness of bolus;Reduced labial seal Oral phase functional implications: Left anterior spillage;Oral residue Pharyngeal Phase Impairments: Cough - Immediate;Throat Clearing - Delayed;Wet Vocal Quality;Throat Clearing - Immediate;Multiple swallows;Suspected delayed Swallow Honey Thick Honey Thick Liquid: Impaired Presentation: Cup Oral Phase Impairments: Poor awareness of bolus;Reduced lingual movement/coordination Oral Phase Functional Implications: Left anterior spillage;Prolonged oral transit Pharyngeal Phase Impairments: Suspected delayed Swallow;Throat Clearing - Delayed;Cough - Immediate;Throat Clearing - Immediate;Wet Vocal Quality;Multiple swallows Puree Puree: Impaired Presentation: Spoon Oral Phase Impairments: Poor awareness of  bolus Oral Phase Functional Implications: Oral residue;Left anterior spillage Pharyngeal Phase Impairments: Multiple swallows;Suspected delayed Swallow;Wet Vocal Quality;Throat Clearing - Immediate;Throat Clearing - Delayed;Cough - Immediate Solid Solid: Impaired Presentation: Spoon Oral Phase Impairments: Reduced labial seal;Reduced lingual movement/coordination;Impaired mastication;Poor awareness of bolus Oral Phase Functional Implications: Left anterior spillage;Impaired mastication;Oral residue;Prolonged oral transit Pharyngeal Phase Impairments: Suspected delayed Swallow;Throat Clearing - Delayed;Cough - Immediate;Throat Clearing - Immediate;Wet Vocal Quality;Multiple swallows BSE Assessment Suspected Esophageal Findings Suspected Esophageal Findings: Globus sensation;Other (comment) (frequent hiccups) Risk for Aspiration Impact on safety and function: Moderate aspiration risk Other Related Risk Factors: History of esophageal-related issues;Cognitive impairment;Deconditioning  Short Term Goals: Week 1: SLP Short Term Goal 1 (Week 1): Pt will tolerate therapeutic trials of ice chips, NTL and puree solids with SLP only to determine readiness for MBS and/or diet upgrade provided mod A cues for swallow strategies SLP Short Term Goal 2 (Week 1): Pt will increase awareness into changes s/p CVA by identifying 3 cognitive and 3 physical impairments with mod A SLP Short Term Goal 3 (Week 1): Pt will increase recall of functional information within and between treatment sessions with mod A multimodal cues SLP Short Term Goal 4 (Week 1): Pt will increase awareness of and compensate for L anterior loss of secretions provided mod A verbal and visual cues SLP Short Term Goal 5 (Week 1): Patient will demonstrate functional problem solving for basic and familiar tasks with mod verbal cues.  Refer to Care Plan for Long Term Goals  Recommendations for other services: Neuropsych  Discharge Criteria:  Patient will be discharged from SLP if patient refuses treatment 3 consecutive times without medical reason, if treatment goals not met, if there is a change in medical status, if patient makes no progress towards goals or if patient is discharged from hospital.  The above assessment, treatment plan, treatment alternatives and goals were discussed and mutually agreed upon: by patient  Dewaine Conger 02/07/2021, 2:31 PM

## 2021-02-07 NOTE — Progress Notes (Incomplete)
Spoke with on call PA about pt not having any sliding scale or CBG along with continuous tube feedings. PA said that feedings were supposed to be discontinued since pt was upgraded to D3/Nectar thick diet. PA advised this nurse to discontinue feedings. Orders followed.

## 2021-02-07 NOTE — Evaluation (Addendum)
Occupational Therapy Assessment and Plan  Patient Details  Name: Rochester Serpe MRN: [id number] Date of Birth: Jul 11, 1945  OT Diagnosis: abnormal posture, cognitive deficits, disturbance of vision, hemiplegia affecting non-dominant side, and muscle weakness (generalized) Rehab Potential: Rehab Potential (ACUTE ONLY): Good ELOS: 2 weeks   Today's Date: 02/07/2021 OT Individual Time: 5456-2563 OT Individual Time Calculation (min): 63 min     Hospital Problem: Principal Problem:   Infarction of right basal ganglia (HCC)   Past Medical History:  Past Medical History:  Diagnosis Date   ETOH abuse    Substance abuse ([place])    Past Surgical History: History reviewed. No pertinent surgical history.  Assessment & Plan Clinical Impression: Jebidiah Baggerly is a 75 year old right-handed male with history of alcohol use/polysubstance and tobacco use as well as seizure disorder noncompliant with Keppra.  Per chart review lives with a friend.  Two-level home one-step to entry.  Reportedly independent prior to admission.  Presented 01/30/2021 with acute onset of left side weakness as well as dysarthric speech.  Cranial CT scan negative for acute changes.  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  CT angiogram of head and neck no large vessel occlusion.  MRI showed acute infarct right lenticulostriate territory.  Moderately large territory involved.  No intracranial hemorrhage.  Neurology follow-up patient consented to tNKASE.  Echocardiogram with ejection fraction of 60 to 65% no wall motion abnormalities.  Admission chemistries were unremarkable except sodium 133, urine drug screen positive for marijuana as well as cocaine.  Currently maintained on aspirin 81 mg  daily and Plavix 75 mg daily for CVA prophylaxis x3 weeks then aspirin alone.  Lovenox for DVT prophylaxis.  EEG showed no signs of seizure patient currently maintained on  Vimpat.  Currently n.p.o. with alternative means of nutritional support.  Therapy evaluations completed due to patient decreased functional ability left side weakness was admitted for a comprehensive rehab program. Pt had MBS today and was cleared for D3/nectars. Still has NGT in place. Patient transferred to CIR on 02/06/2021.    Patient currently requires mod with basic self-care skills secondary to muscle weakness and muscle paralysis, impaired timing and sequencing, abnormal tone, unbalanced muscle activation, decreased coordination, and decreased motor planning, decreased visual motor skills, decreased midline orientation, decreased attention to left, and left side neglect, and decreased initiation, decreased attention, decreased problem solving, decreased memory, and delayed processing.  Prior to hospitalization, patient could complete ADLs with independence.  Patient will benefit from skilled intervention to decrease level of assist with basic self-care skills and increase independence with basic self-care skills prior to discharge home with care partner.  Anticipate patient will require 24 hour supervision and follow up home health.  OT - End of Session Activity Tolerance: Tolerates 10 - 20 min activity with multiple rests Endurance Deficit: Yes Endurance Deficit Description: Quick to fatigue OT Assessment Rehab Potential (ACUTE ONLY): Good OT Barriers to Discharge: Inaccessible home environment;Home environment access/layout OT Patient demonstrates impairments in the following area(s): Balance;Cognition;Endurance;Motor;Nutrition;Safety;Sensory;Vision OT Basic ADL's Functional Problem(s): Eating;Bathing;Grooming;Dressing;Toileting OT Transfers Functional Problem(s): Toilet;Tub/Shower OT Additional Impairment(s): Fuctional Use of Upper Extremity OT Plan OT Intensity: Minimum of 1-2 x/day, 45 to 90 minutes OT Frequency: 5 out of 7 days OT Duration/Estimated Length of Stay: 2 weeks OT  Treatment/Interventions: Balance/vestibular training;Cognitive remediation/compensation;Community reintegration;Discharge planning;DME/adaptive equipment instruction;Functional electrical stimulation;Functional mobility training;Neuromuscular re-education;Patient/family education;Psychosocial support;Self Care/advanced ADL retraining;Therapeutic Activities;Therapeutic Exercise;UE/LE Strength taining/ROM;UE/LE Coordination activities;Visual/perceptual remediation/compensation OT Self Feeding Anticipated Outcome(s): Set-up OT Basic Self-Care Anticipated  Outcome(s): Supervision OT Toileting Anticipated Outcome(s): Supervision OT Bathroom Transfers Anticipated Outcome(s): Supervision OT Recommendation Recommendations for Other Services: Speech consult Patient destination: Home Follow Up Recommendations: Home health OT Equipment Recommended: To be determined   OT Evaluation Precautions/Restrictions  Precautions Precautions: Fall Precaution Comments: L inattention Restrictions Weight Bearing Restrictions: No General Chart Reviewed: Yes Family/Caregiver Present: No Vital Signs   Pain Pain Assessment Pain Scale: 0-10 Pain Score: 0-No pain Home Living/Prior Functioning Home Living Family/patient expects to be discharged to:: Private residence (Simultaneous filing. User may not have seen previous data.) Living Arrangements: Spouse/significant other Available Help at Discharge: Family Type of Home: House Home Access: Stairs to enter Technical brewer of Steps: 1 Entrance Stairs-Rails: Left Home Layout: Two level (split level) Alternate Level Stairs-Number of Steps: 6, per patient Alternate Level Stairs-Rails: Left Bathroom Shower/Tub: Chiropodist: Standard Bathroom Accessibility: Yes Additional Comments: [name] provided information via video chat  Lives With: Significant other IADL History Homemaking Responsibilities: No Current License: No Prior  Function Level of Independence: Independent with basic ADLs, Independent with gait, Independent with transfers  Able to Take Stairs?: Yes Driving: No Vocation: Retired Surveyor, mining Baseline Vision/History: 1 Wears glasses (Readers) Ability to See in Adequate Light: 0 Adequate Patient Visual Report: No change from baseline Vision Assessment?: Yes Eye Alignment: Impaired (comment) Ocular Range of Motion: Restricted on the left Alignment/Gaze Preference: Gaze right Tracking/Visual Pursuits: Requires cues, head turns, or add eye shifts to track;Decreased smoothness of vertical tracking;Impaired - to be further tested in functional context Saccades: Additional head turns occurred during testing;Decreased speed of saccadic movement Convergence: Impaired (comment) Visual Fields: Impaired-to be further tested in functional context Perception  Perception: Impaired Inattention/Neglect: Does not attend to left visual field;Does not attend to left side of body Praxis Praxis: Intact Cognition Overall Cognitive Status: Impaired/Different from baseline Arousal/Alertness: Awake/alert Orientation Level: Person;Place;Situation Person: Oriented Place: Oriented Situation: Oriented Year: Other (Comment) (2002) Month: December Day of Week: Correct Memory: Impaired Memory Impairment: Retrieval deficit;Decreased recall of new information Immediate Memory Recall: Sock;Blue;Bed Memory Recall Sock: Not able to recall Memory Recall Blue: Without Cue Memory Recall Bed: Without Cue Attention: Focused;Sustained Focused Attention: Impaired Focused Attention Impairment: Verbal complex Sustained Attention: Impaired Sustained Attention Impairment: Verbal complex Awareness: Impaired Awareness Impairment: Intellectual impairment Problem Solving: Impaired Problem Solving Impairment: Verbal complex Sequencing: Impaired Sequencing Impairment: Verbal complex Organizing: Impaired Organizing Impairment: Verbal  complex Safety/Judgment: Impaired Comments: Decreased knowledge of current deficits and decreased awareness of need for external assist. Sensation Sensation Light Touch: Impaired by gross assessment Hot/Cold: Impaired by gross assessment Proprioception: Impaired by gross assessment (Impaired in LUE with vision occluded.) Coordination Gross Motor Movements are Fluid and Coordinated: No Fine Motor Movements are Fluid and Coordinated: No Coordination and Movement Description: WFL on R; L hemiparesis Finger Nose Finger Test: WFL on R; L hemiparesis Motor  Motor Motor: Hemiplegia;Abnormal postural alignment and control Motor - Skilled Clinical Observations: L hemi  Trunk/Postural Assessment  Cervical Assessment Cervical Assessment: Exceptions to [facility] (Forward head) Thoracic Assessment Thoracic Assessment: Exceptions to [facility] (Rounded shoulders; kyphotoc) Lumbar Assessment Lumbar Assessment: Exceptions to [facility] (Posterior pelvic tilt) Postural Control Postural Control: Deficits on evaluation (Delayed; insufficient)  Balance Balance Balance Assessed: Yes Static Sitting Balance Static Sitting - Balance Support: Right upper extremity supported;No upper extremity supported;Feet supported Static Sitting - Level of Assistance: 5: Stand by assistance Dynamic Sitting Balance Dynamic Sitting - Balance Support: Right upper extremity supported;No upper extremity supported;Feet supported Dynamic Sitting - Level of Assistance: 4: Min  assist Sitting balance - Comments: R lateral lean, tactile cues for orienation to midline Static Standing Balance Static Standing - Balance Support: Bilateral upper extremity supported;During functional activity Static Standing - Level of Assistance: 4: Min assist Dynamic Standing Balance Dynamic Standing - Balance Support: Bilateral upper extremity supported;During functional activity Dynamic Standing - Level of Assistance: 3: Mod assist Extremity/Trunk  Assessment RUE Assessment RUE Assessment: Within Functional Limits LUE Assessment LUE Assessment: Exceptions to [facility] LUE Body System: Neuro Brunstrum levels for arm and hand: Arm;Hand Brunstrum level for arm: Stage II Synergy is developing Brunstrum level for hand: Stage II Synergy is developing  Care Tool Care Tool Self Care Eating   Eating Assist Level: Set up assist    Oral Care    Oral Care Assist Level: Moderate Assistance - Patient 50 - 74%    Bathing   Body parts bathed by patient: Left arm;Chest;Abdomen;Front perineal area;Right upper leg;Left upper leg;Face Body parts bathed by helper: Right arm;Buttocks;Right lower leg;Left lower leg   Assist Level: Moderate Assistance - Patient 50 - 74%    Upper Body Dressing(including orthotics)   What is the patient wearing?: Pull over shirt   Assist Level: Moderate Assistance - Patient 50 - 74%    Lower Body Dressing (excluding footwear)   What is the patient wearing?: Pants;Underwear/pull up Assist for lower body dressing: Moderate Assistance - Patient 50 - 74%    Putting on/Taking off footwear   What is the patient wearing?: Non-skid slipper socks         Care Tool Toileting Toileting activity Toileting Activity did not occur (Clothing management and hygiene only): N/A (no void or bm)       Care Tool Bed Mobility Roll left and right activity   Roll left and right assist level: Minimal Assistance - Patient > 75%    Sit to lying activity   Sit to lying assist level: Minimal Assistance - Patient > 75%    Lying to sitting on side of bed activity         Care Tool Transfers Sit to stand transfer   Sit to stand assist level: Minimal Assistance - Patient > 75%    Chair/bed transfer   Chair/bed transfer assist level: Moderate Assistance - Patient 50 - 74%     Toilet transfer         Care Tool Cognition  Expression of Ideas and Wants Expression of Ideas and Wants: 4. Without difficulty (complex and basic) -  expresses complex messages without difficulty and with speech that is clear and easy to understand  Understanding Verbal and Non-Verbal Content Understanding Verbal and Non-Verbal Content: 4. Understands (complex and basic) - clear comprehension without cues or repetitions   Memory/Recall Ability Memory/Recall Ability : Current season;Location of own room;That he or she is in a hospital/hospital unit   Refer to Care Plan for [place] 1 OT Short Term Goal 1 (Week 1): Patient will don UB clothing with Min A and good carryover of hemi technique. OT Short Term Goal 2 (Week 1): Patient will complete 2/3 parts of toileting task with Min A. OT Short Term Goal 3 (Week 1): Patient will locate 3 ADL items on L side of sink surface with supervision A and good carryover of compensatory visual techniques. OT Short Term Goal 4 (Week 1): Patient will complete toilet transfer with Min A and LRAD.  Recommendations for other services: Other: TBD    Skilled Therapeutic Intervention Patient met  lying supine in bed in agreement with OT treatment session. 0/10 pain reported at rest and with activity. OT provided education on purpose of OT, ELOS, goals, and expectations of rehab. Patient expressed verbal understanding but would benefit from continued education. Patient working on breakfast meal upon entry although poorly positioned. Able to reposition with Min A, use of bed rail and verbal/tactile cues for orientation to midline. Mod spillage and moderate coughing noted. Patient transitioned from supine to EOB with HOB elevated, use of bed rail and assist to elevate trunk. Seated EOB, patient completed UB/LB bathing/dressing with Mod A overall. Cues for attention to L side of body/L visual field and for hemi technique. Please refer below for additional details. Stand-pivot to recliner on R with use of RW and hand over hand assist to maintain position of LUE. Session concluded with patient  seated in recliner with call bell within reach, belt alarm activated and all needs met.   ADL ADL Eating: Moderate cueing;Minimal assistance Where Assessed-Eating: Bed level Grooming: Maximal assistance Where Assessed-Grooming: Edge of bed Upper Body Bathing: Moderate assistance Where Assessed-Upper Body Bathing: Edge of bed Lower Body Bathing: Moderate assistance Where Assessed-Lower Body Bathing: Edge of bed Upper Body Dressing: Moderate assistance Where Assessed-Upper Body Dressing: Edge of bed Lower Body Dressing: Moderate assistance Where Assessed-Lower Body Dressing: Edge of bed Toileting: Not assessed Toilet Transfer Method: Not assessed Tub/Shower Transfer: Not assessed ADL Comments: Cues for hemi technique and hand over hand assist to utilize LUE at gross assist level Mobility  Bed Mobility Bed Mobility: Rolling Right;Rolling Left;Supine to Sit;Sitting - Scoot to [employer] of Bed Rolling Right: Minimal Assistance - Patient > 75% Rolling Left: Minimal Assistance - Patient > 75% Supine to Sit: Minimal Assistance - Patient > 75% Sitting - Scoot to Edge of Bed: Minimal Assistance - Patient > 75% Transfers Sit to Stand: Minimal Assistance - Patient > 75%;Moderate Assistance - Patient 50-74% Stand to Sit: Minimal Assistance - Patient > 75%   Discharge Criteria: Patient will be discharged from OT if patient refuses treatment 3 consecutive times without medical reason, if treatment goals not met, if there is a change in medical status, if patient makes no progress towards goals or if patient is discharged from hospital.  The above assessment, treatment plan, treatment alternatives and goals were discussed and mutually agreed upon: by patient  Tiondra Fang R [place] 02/07/2021, 12:03 PM

## 2021-02-07 NOTE — Progress Notes (Signed)
Inpatient Rehabilitation  Patient information reviewed and entered into eRehab system by Meylin Stenzel Laurel Harnden, OTR/L.   Information including medical coding, functional ability and quality indicators will be reviewed and updated through discharge.    

## 2021-02-07 NOTE — Progress Notes (Signed)
Initial Nutrition Assessment  DOCUMENTATION CODES:  Not applicable  INTERVENTION:  Start Osmolite 1.5 @ 55 ml/hr (1320 ml/day) - ProSource TF 45 ml daily   Tube feeding regimen at goal provides 2020 kcal, 94 grams of protein, and 1006 ml of H2O.   - Free water flushes of 150 ml q 4 hours for a total of 1906 ml free water daily.  Monitor magnesium, potassium, and phosphorus BID for at least 3 days, MD to replete as needed, as pt is at risk for refeeding syndrome.   Discontinue Ensure Max daily given pt is NPO now.  NUTRITION DIAGNOSIS:  Inadequate oral intake related to inability to eat as evidenced by NPO status.  GOAL:  Patient will meet greater than or equal to 90% of their needs  MONITOR:  TF tolerance, Diet advancement, Labs, Weight trends, I & O's  REASON FOR ASSESSMENT:  Consult Enteral/tube feeding initiation and management  ASSESSMENT:  75 year old male who presented to the ED on 12/20 as a Code Stroke. Pt received TNKase in the ED. PMH of EtOH abuse (stopped drinking in January 2022), polysubstance abuse, smoking, seizures. Pt admitted with R lacunar basal ganglia infarct likely secondary to small vessel disease 12/23 - Cortrak placed (tip gastric)   RD working remotely.  Pt ate 0% of dinner last night and 50% of a Dysphagia 2/nectar thickened lunch today. Pt then transferred to NPO right after lunch.  Another RD just saw patient on 12/23 and ordered tube feeding as appropriate. This RD to order the same protocol to be started today via Cortrak tube.  Per Epic, pt has lost ~10 lbs (7%) in the last 11 months, which is not necessarily significant for the time frame.  Pt is likely still severely malnourished in the social setting, however this RD cannot definitively diagnose at this time.  Pt refeeding during admission with tube feeding. RD to confirm labs but to start at goal rate with TF to provide adequate nutrition.  Supplements: ProSource TF daily, Ensure Max  daily  Medications: reviewed  Labs: reviewed  NUTRITION - FOCUSED PHYSICAL EXAM: Unable to perform - defer to follow-up  Diet Order:   Diet Order             Diet NPO time specified  Diet effective now                  EDUCATION NEEDS:  No education needs have been identified at this time  Skin:  Skin Assessment: Reviewed RN Assessment  Last BM:  PTA  Height:  Ht Readings from Last 1 Encounters:  02/06/21 5\' 8"  (1.727 m)   Weight:  Wt Readings from Last 1 Encounters:  02/07/21 56.8 kg   BMI:  Body mass index is 19.04 kg/m.  Estimated Nutritional Needs:  Kcal:  1900-2100 Protein:  90-105 grams Fluid:  >1.9 L  Derrel Nip, RD, LDN (she/her/hers) Clinical Inpatient Dietitian RD Pager/After-Hours/Weekend Pager # in [phone]

## 2021-02-07 NOTE — Plan of Care (Signed)
°  Problem: RH Swallowing Goal: LTG Patient will consume least restrictive diet using compensatory strategies with assistance (SLP) Description: LTG:  Patient will consume least restrictive diet using compensatory strategies with assistance (SLP) Flowsheets (Taken 02/07/2021 1445) LTG: Pt Patient will consume least restrictive diet using compensatory strategies with assistance of (SLP): Minimal Assistance - Patient > 75% Goal: LTG Pt will demonstrate functional change in swallow as evidenced by bedside/clinical objective assessment (SLP) Description: LTG: Patient will demonstrate functional change in swallow as evidenced by bedside/clinical objective assessment (SLP) Flowsheets (Taken 02/07/2021 1445) LTG: Patient will demonstrate functional change in swallow as evidenced by bedside/clinical objective assessment: Oropharyngeal swallow   Problem: RH Expression Communication Goal: LTG Patient will increase speech intelligibility (SLP) Description: LTG: Patient will increase speech intelligibility at word/phrase/conversation level with cues, % of the time (SLP) Flowsheets (Taken 02/07/2021 1445) LTG: Patient will increase speech intelligibility (SLP): Modified Independent   Problem: RH Problem Solving Goal: LTG Patient will demonstrate problem solving for (SLP) Description: LTG:  Patient will demonstrate problem solving for basic/complex daily situations with cues  (SLP) Flowsheets (Taken 02/07/2021 1445) LTG: Patient will demonstrate problem solving for (SLP): Basic daily situations LTG Patient will demonstrate problem solving for: Supervision   Problem: RH Memory Goal: LTG Patient will use memory compensatory aids to (SLP) Description: LTG:  Patient will use memory compensatory aids to recall biographical/new, daily complex information with cues (SLP) Flowsheets (Taken 02/07/2021 1445) LTG: Patient will use memory compensatory aids to (SLP): Supervision   Problem: RH Attention Goal: LTG  Patient will demonstrate this level of attention during functional activites (SLP) Description: LTG:  Patient will will demonstrate this level of attention during functional activites (SLP) Flowsheets (Taken 02/07/2021 1445) Patient will demonstrate during cognitive/linguistic activities the attention type of: Sustained Patient will demonstrate this level of attention during cognitive/linguistic activities in: Home LTG: Patient will demonstrate this level of attention during cognitive/linguistic activities with assistance of (SLP): Supervision   Problem: RH Awareness Goal: LTG: Patient will demonstrate awareness during functional activites type of (SLP) Description: LTG: Patient will demonstrate awareness during functional activites type of (SLP) Flowsheets (Taken 02/07/2021 1445) Patient will demonstrate during cognitive/linguistic activities awareness type of: Emergent LTG: Patient will demonstrate awareness during cognitive/linguistic activities with assistance of (SLP): Minimal Assistance - Patient > 75%

## 2021-02-08 DIAGNOSIS — I639 Cerebral infarction, unspecified: Secondary | ICD-10-CM | POA: Diagnosis not present

## 2021-02-08 DIAGNOSIS — I69322 Dysarthria following cerebral infarction: Secondary | ICD-10-CM | POA: Diagnosis not present

## 2021-02-08 DIAGNOSIS — F1910 Other psychoactive substance abuse, uncomplicated: Secondary | ICD-10-CM | POA: Diagnosis not present

## 2021-02-08 DIAGNOSIS — G40909 Epilepsy, unspecified, not intractable, without status epilepticus: Secondary | ICD-10-CM | POA: Diagnosis not present

## 2021-02-08 LAB — URINALYSIS, COMPLETE (UACMP) WITH MICROSCOPIC
Bacteria, UA: NONE SEEN
Bilirubin Urine: NEGATIVE
Glucose, UA: NEGATIVE mg/dL
Hgb urine dipstick: NEGATIVE
Ketones, ur: NEGATIVE mg/dL
Leukocytes,Ua: NEGATIVE
Nitrite: NEGATIVE
Protein, ur: NEGATIVE mg/dL
Specific Gravity, Urine: 1.012 (ref 1.005–1.030)
pH: 6 (ref 5.0–8.0)

## 2021-02-08 LAB — GLUCOSE, CAPILLARY
Glucose-Capillary: 111 mg/dL — ABNORMAL HIGH (ref 70–99)
Glucose-Capillary: 114 mg/dL — ABNORMAL HIGH (ref 70–99)
Glucose-Capillary: 118 mg/dL — ABNORMAL HIGH (ref 70–99)
Glucose-Capillary: 126 mg/dL — ABNORMAL HIGH (ref 70–99)
Glucose-Capillary: 85 mg/dL (ref 70–99)
Glucose-Capillary: 93 mg/dL (ref 70–99)

## 2021-02-08 LAB — PHOSPHORUS
Phosphorus: 4.2 mg/dL (ref 2.5–4.6)
Phosphorus: 4.3 mg/dL (ref 2.5–4.6)

## 2021-02-08 LAB — MAGNESIUM
Magnesium: 2.3 mg/dL (ref 1.7–2.4)
Magnesium: 2.4 mg/dL (ref 1.7–2.4)

## 2021-02-08 MED ORDER — AMANTADINE HCL 50 MG/5ML PO SOLN
100.0000 mg | Freq: Two times a day (BID) | ORAL | Status: DC
  Administered 2021-02-09 – 2021-02-13 (×9): 100 mg
  Filled 2021-02-08 (×11): qty 10

## 2021-02-08 MED ORDER — POLYETHYLENE GLYCOL 3350 17 G PO PACK
17.0000 g | PACK | Freq: Every day | ORAL | Status: DC
  Administered 2021-02-08 – 2021-02-19 (×11): 17 g via ORAL
  Filled 2021-02-08 (×14): qty 1

## 2021-02-08 NOTE — Progress Notes (Signed)
Physical Therapy Session Note  Patient Details  Name: Douglas Spencer MRN: [id number] Date of Birth: 01-11-1946  Today's Date: 02/08/2021 PT Individual Time: 1135-1205 and 1440-1535 PT Individual Time Calculation (min): 30 min and 55 min   Short Term Goals: Week 1:  PT Short Term Goal 1 (Week 1): Patient will perborm basic transfers with CGA using LRAD. PT Short Term Goal 2 (Week 1): Patient will ambulate >100 feet with CGA using LRAD. PT Short Term Goal 3 (Week 1): Patient will ascend/descend 6 steps with L rail with min A-CGA simulating home set-up. PT Short Term Goal 4 (Week 1): Patient will improve Berg Balance Scale score by MDC (7 points)  Skilled Therapeutic Interventions/Progress Updates:     Session 1: Patient in bed upon PT arrival. Patient alert and agreeable to PT session. Patient denied pain during session. Patient frustrated about being in the bed, reported that "someone" had told him he needed to be in the bed, no indications via chart review and RN denied reason for patient to be in the bed at this time.   Tube feed held during session and resumed at end of session.   Patient reported that his cousin, [name], would be bringing some shoes and wanted to confirm that what she had was appropriate. Educated on need for good arch support and ankle support for improved stability when walking. [name] to bring tennis shoes for patient this afternoon.   Therapeutic Activity: Bed Mobility: Patient performed supine to sit with supervision-mod I.  Transfers: Patient performed stand pivot bed>w/c with mod-min A without AD. Provided verbal cues for R weight shift due to L lean in standing and increased L hip/knee extension when standing and in stance while stepping.  Neuromuscular Re-ed: Patient performed the following lower extremity motor control activities: -sit to/from stand and standing balance 30-60 sec x8 progressing from mod-min A to CGA with use of R hand to push up/reach back  for w/c arm rest; focused on midline orientation and L trunk/hip/knee elongation to come to standing -ambulation x55 feet holding onto IV pole on R for reciprocal stepping pattern and visual target for midline orientation with min A  Patient in w/c in the room at end of session with breaks locked, seat belt alarm set, and all needs within reach.    Session 2: Patient in w/c with his cousin in the room upon PT arrival. Patient alert and agreeable to PT session. Patient denied pain during session.  Patient's cousin brought tennis shoes for the patient. Donned shoes with total A for energy/time management. Noted good fit with increased space at great toes bilaterally. Patient stood with shoes tied together by the tag to assess fit in standing before tag was removed. Utilized increased care and cues for standing still with tag on for patient safety in standing with min A.  Provided [name] with update on patient's progress and discussed stroke prevention and life-style modifications to reduce risk of stroke with patient and his cousin during session.   Therapeutic Activity: Transfers: Patient performed sit to/from stand x6 with min A progressing to CGA. Provided verbal cues for forward and R weight shift.  Gait Training:  Patient ambulated 40 feet, 180 feet x2, 40 feet with mirror feedback, and 145 feet without an AD with min A. Ambulated with reciprocal gait pattern, decreased gait speed, decreased step length and height L>R with intermittent scuffing with tennis shoes donned, L trunk lean, and decreased L knee/hip extension in stance. Provided verbal cues for midline  and erect posture, increased hamstring and hip flexor activation in pre-swing, increased arm swing for balance, and use of auditory cue of scuffing for increased step height. Pre-gait: alternating step-taps x10 on 4" step focused on hamstring and hip flexor activation on L tap and L hip/knee extension and weight shift on L stance with min  and intermittent mod A, added mirror half way for visual feedback of L weight shift in midline  Patient in w/c in the room at end of session with breaks locked, seat belt alarm set, and all needs within reach.   Therapy Documentation Precautions:  Precautions Precautions: Fall Precaution Comments: L inattention Restrictions Weight Bearing Restrictions: No    Therapy/Group: Individual Therapy  Esteven Overfelt L Brieonna Crutcher PT, DPT  02/08/2021, 4:41 PM

## 2021-02-08 NOTE — Progress Notes (Signed)
Occupational Therapy Session Note  Patient Details  Name: Douglas Spencer MRN: [id number] Date of Birth: [birth year]  Today's Date: 02/08/2021 OT Individual Time: 0900-1000 OT Individual Time Calculation (min): 60 min    Short Term Goals: Week 1:  OT Short Term Goal 1 (Week 1): Patient will don UB clothing with Min A and good carryover of hemi technique. OT Short Term Goal 2 (Week 1): Patient will complete 2/3 parts of toileting task with Min A. OT Short Term Goal 3 (Week 1): Patient will locate 3 ADL items on L side of sink surface with supervision A and good carryover of compensatory visual techniques. OT Short Term Goal 4 (Week 1): Patient will complete toilet transfer with Min A and LRAD. Week 2:    Week 3:     Skilled Therapeutic Interventions/Progress Updates:    Patient seen for OT in room, patient completed BADL related task in bathing at EOB. Patient able to wash his face and UB with MinA secondary to challenges with UB strength and bilateral hand coordination. Patient encouraged to incorporate the bed rail to aid in dynamic sit balance for functional task performance. The patient was able to wash his perineal area with s/u, but required ModA fo his bottom and back.  The pt was required ModA for donning his UB clothing items and MaxA for donning his brief.  The pt required verbal and physical prompting for maintaining dynamic sit balance after >10 minute .  The pt was instructed in taking rest breaks and including relaxation breathing to improve safe compliance.  The pt  was instructed in proper position of the weaker extremity and to avoid jerking or hanging of the extremity to minimize his risk for subluxation.  The pt completed PROM exercise incorporating BUE in various planes with attention to joint position.  The pt returned to supine in bed with his bedside table in place, his call light and alarm activated and in place.  The pt had no c/o pain  this treatment session.   Therapy  Documentation Precautions:  Precautions Precautions: Fall Precaution Comments: L inattention Restrictions Weight Bearing Restrictions: No General:   Vital Signs:  Pain:   AVision   Perception    Praxis   Balance   Exercises:   Other Treatments:     Therapy/Group: Individual Therapy  Yvonne Kendall 02/08/2021, 12:03 PM

## 2021-02-08 NOTE — Progress Notes (Signed)
Speech Language Pathology Daily Session Note  Patient Details  Name: Douglas Spencer MRN: [id number] Date of Birth: Dec 24, 1945  Today's Date: 02/08/2021 SLP Individual Time: 1330-1430 SLP Individual Time Calculation (min): 60 min  Short Term Goals: Week 1: SLP Short Term Goal 1 (Week 1): Pt will tolerate therapeutic trials of ice chips, NTL and puree solids with SLP only to determine readiness for MBS and/or diet upgrade provided mod A cues for swallow strategies SLP Short Term Goal 2 (Week 1): Pt will increase awareness into changes s/p CVA by identifying 3 cognitive and 3 physical impairments with mod A SLP Short Term Goal 3 (Week 1): Pt will increase recall of functional information within and between treatment sessions with mod A multimodal cues SLP Short Term Goal 4 (Week 1): Pt will increase awareness of and compensate for L anterior loss of secretions provided mod A verbal and visual cues SLP Short Term Goal 5 (Week 1): Patient will demonstrate functional problem solving for basic and familiar tasks with mod verbal cues.  Skilled Therapeutic Interventions:   Patient seen for skilled ST session focused on dysphagia and cognitive goals. Patient's cousin (former Therapist, sports) present during second half of session and she was able to confirm some of history that patient providing. Patient stated that he had history of reflux and although he had been prescribed nexium, he only took Tums as he deemed necessary. He informed SLP that he has had issues with reflux and swallowing difficulties most of his life. Prior to PO's, patient's voice was mildly congested sounding and he was able to clear this by coughing and expectorating secretions. SLP then observed patient with PO intake of 2-3 ice chips. He exhibited briefly delayed explosive coughing response during which he became red in the face with congested, wet sounding cough. While he was coughing, he told SLP "this is normal for me". SLP then observed patient  with PO intake of cup sips of nectar thick liquids and bites of puree solids (pudding). He exhibited intermittent instances of congested coughing but this did not appear different from prior to PO intake. He demonstrated anterior leakage of saliva and PO's which he did independently manage using a dry washcloth to wipe mouth. During discussion with patient and his cousin, SLP was informed that patient has indeed had a long history of GERD and per cousin he has not seen a doctor since "the 12's" and "what you've got here is a non-compliant patient". Patient was fixated on talking about how he loves cheese, namely pimento cheese and throughout discussion he would mention this. SLP spent some time reviewing patient's MBS from 12/27 as well as reviewing recent ST notes. Based on this review as well as patient's adequate toleration of nectar thick liquids and puree solids, SLP recommended upgrade from NPO to Dys 1, nectar thick liquids. SLP spoke with patient's CIR PA to confirm this change with PA stating that they will wait to d/c Cortrak until patient tolerating PO diet without significant difficulty. SLP left patient in chair with all needs within reach and family member present in room. He continues to benefit from skilled SLP intervention to maximize cognitive and swallow function prior to discharge.  Pain Pain Assessment Pain Scale: 0-10 Pain Score: 0-No pain  Therapy/Group: Individual Therapy  Sonia Baller, MA, CCC-SLP Speech Therapy

## 2021-02-08 NOTE — Progress Notes (Signed)
Inpatient Rehabilitation Care Coordinator Assessment and Plan Patient Details  Name: Douglas Spencer MRN: [id number] Date of Birth: [birth year]  Today's Date: 02/08/2021  Hospital Problems: Principal Problem:   Infarction of right basal ganglia [facility])  Past Medical History:  Past Medical History:  Diagnosis Date   ETOH abuse    Substance abuse ([place])    Past Surgical History: History reviewed. No pertinent surgical history. Social History:  reports that he has been smoking cigarettes. He has been smoking an average of .5 packs per day. He does not have any smokeless tobacco history on file. He reports current alcohol use. He reports current drug use. Drugs: Marijuana and Cocaine.  Family / Support Systems Marital Status: Divorced How Long?: 50+ years ago Patient Roles: Parent Spouse/Significant Other: Divorced Children: Pt has two adult children. Reportsno communication. Other Supports: brother and cousin- however reports they are likely not going to be assisting. Anticipated Caregiver: [facility]. Ability/Limitations of Caregiver: [name] reports some back issues but will be providing 24/7 care to pt. Caregiver Availability: 24/7 Family Dynamics: Pt lives with friend [name]  Social History Preferred language: English Religion: Christian Cultural Background: Pt worked as Land, and Merchant navy officer: college grad; did not Therapist, sports. Health Literacy - How often do you need to have someone help you when you read instructions, pamphlets, or other written material from your doctor or pharmacy?: Rarely Writes: Yes Employment Status: Retired Date Retired/Disabled/Unemployed: Unknown Public relations account executive Issues: Denies Guardian/Conservator: N/A   Abuse/Neglect Abuse/Neglect Assessment Can Be Completed: Yes Physical Abuse: Denies Verbal Abuse: Denies Sexual Abuse: Denies Exploitation of patient/patient's resources: Denies Self-Neglect:  Denies  Patient response to: Social Isolation - How often do you feel lonely or isolated from those around you?: Never  Emotional Status Pt's affect, behavior and adjustment status: Pt in good spirits at time of visit Recent Psychosocial Issues: Admits to depression and anxiety since being in the hospital Psychiatric History: Denies Substance Abuse History: Admits to smoking cigarettes atleast 10 per day. Also varies depending on the day. 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 Pt admits to smokign marijuana atleast once a week or less.  Patient / Family Perceptions, Expectations & Goals Pt/Family understanding of illness & functional limitations: Pt and friend have a general understanding of pt care needs Premorbid pt/family roles/activities: Independent Anticipated changes in roles/activities/participation: Assistance with ADLs/IADLs Pt/family expectations/goals: Pt goal is "to be able to be mobile."  [employer]: None Premorbid Home Care/DME Agencies: None Transportation available at discharge: [facility] Is the patient able to respond to transportation needs?: Yes (Pt uses Medicaid transportation for medical appointments at times) In the past 12 months, has lack of transportation kept you from medical appointments or from getting medications?: No In the past 12 months, has lack of transportation kept you from meetings, work, or from getting things needed for daily living?: No Resource referrals recommended: Neuropsychology  Discharge Planning Living Arrangements: Non-relatives/Friends Support Systems: Friends/neighbors Type of Residence: Private residence Insurance Resources: [employer] (specify county), Multimedia programmer (specify) Scientist, clinical (histocompatibility and immunogenetics) Medicare (primary) and [employer]) Museum/gallery curator Resources: Radio broadcast assistant Screen Referred: No Living Expenses: Education officer, community Management: Patient Does the patient have any  problems obtaining your medications?: No Home Management: Patient's friend [name] help manage home care needs Patient/Family Preliminary Plans: No changes Care Coordinator Barriers to Discharge: Decreased caregiver support, Lack of/limited family support Care Coordinator Anticipated Follow Up Needs: HH/OP Expected length of stay: 12-14 days  Clinical Impression SW met  with pt and pt friend [name] in room to introduce self, explain role, and discuss discharge process. Pt is not a English as a second language teacher. No HCPOA. Pt reports verbally has agreed for friend [name] to make medical decisions. DME: handheld shower head, suction grab bars, and motion lighting.   Aubery Douthat A Shila Kruczek 02/08/2021, 3:26 PM

## 2021-02-08 NOTE — Care Management (Signed)
Inpatient [facility] Individual Statement of Services  Patient Name:  Douglas Spencer  Date:  02/08/2021  Welcome to the [facility].  Our goal is to provide you with an individualized program based on your diagnosis and situation, designed to meet your specific needs.  With this comprehensive rehabilitation program, you will be expected to participate in at least 3 hours of rehabilitation therapies Monday-Friday, with modified therapy programming on the weekends.  Your rehabilitation program will include the following services:  Physical Therapy (PT), Occupational Therapy (OT), Speech Therapy (ST), 24 hour per day rehabilitation nursing, Therapeutic Recreaction (TR), Psychology, Neuropsychology, Care Coordinator, Rehabilitation Medicine, [place], and Other  Weekly team conferences will be held on Tuesdays to discuss your progress.  Your Inpatient Rehabilitation Care Coordinator will talk with you frequently to get your input and to update you on team discussions.  Team conferences with you and your family in attendance may also be held.  Expected length of stay: 12-14 days    Overall anticipated outcome: Supervision  Depending on your progress and recovery, your program may change. Your Inpatient Rehabilitation Care Coordinator will coordinate services and will keep you informed of any changes. Your Inpatient Rehabilitation Care Coordinator's name and contact numbers are listed  below.  The following services may also be recommended but are not provided by the [facility] will be made to provide these services after discharge if needed.  Arrangements include referral to agencies that provide these services.  Your insurance has been verified to be:  [employer]  Your primary  doctor is:  No PCP listed  Pertinent information will be shared with your doctor and your insurance company.  Inpatient Rehabilitation Care Coordinator:  Cathleen Corti [phone] or ([phone]  Information discussed with and copy given to patient by: [name], 02/08/2021, 9:07 AM

## 2021-02-08 NOTE — Progress Notes (Signed)
PROGRESS NOTE   Subjective/Complaints: Pt denies any problems. Says he slept. Had problems with D2 diet yesterday, s/s aspiration and diet downgraded to NPO. Wants to eat "cheese"  ROS: Limited due to cognitive/behavioral .    Objective:   No results found. Recent Labs    02/06/21 0103 02/07/21 0510  WBC 7.3 9.8  HGB 14.9 16.1  HCT 42.8 46.0  PLT 171 191   Recent Labs    02/06/21 0103 02/07/21 0510  NA 139 138  K 3.9 4.1  CL 111 105  CO2 22 24  GLUCOSE 109* 98  BUN 16 18  CREATININE 0.80 0.89  CALCIUM 8.7* 8.9    Intake/Output Summary (Last 24 hours) at 02/08/2021 1053 Last data filed at 02/07/2021 1340 Gross per 24 hour  Intake 120 ml  Output --  Net 120 ml        Physical Exam: Vital Signs Blood pressure (!) 128/91, pulse 94, temperature 97.9 F (36.6 C), resp. rate 16, height 5\' 8"  (1.727 m), weight 57.4 kg, SpO2 95 %.  Constitutional: No distress . Vital signs reviewed. HEENT: NCAT, EOMI, oral membranes moist Neck: supple Cardiovascular: RRR without murmur. No JVD    Respiratory/Chest: CTA Bilaterally without wheezes or rales. Normal effort    GI/Abdomen: BS +, non-tender, non-distended Ext: no clubbing, cyanosis, or edema Psych: pleasant and cooperative, confused  Skin: Clean and intact without signs of breakdown Neuro:  pt seems less alert,  dysarthric. Voice wet. Talked about Pryor Curia of book he has in room.. Impaired insight. Right gaze preference but can be cued to left. Left central 7 and tongue deviation. LUE 1+ pec, 2+ biceps, trace hand. LLE 4/5 prox to distal. Decreased LT left arm>leg.  Musculoskeletal: no focal pain, normal rom    Assessment/Plan: 1. Functional deficits which require 3+ hours per day of interdisciplinary therapy in a comprehensive inpatient rehab setting. Physiatrist is providing close team supervision and 24 hour management of active medical problems listed  below. Physiatrist and rehab team continue to assess barriers to discharge/monitor patient progress toward functional and medical goals  Care Tool:  Bathing    Body parts bathed by patient: Left arm, Chest, Abdomen, Front perineal area, Right upper leg, Left upper leg, Face   Body parts bathed by helper: Right arm, Buttocks, Right lower leg, Left lower leg     Bathing assist Assist Level: Moderate Assistance - Patient 50 - 74%     Upper Body Dressing/Undressing Upper body dressing   What is the patient wearing?: Pull over shirt    Upper body assist Assist Level: Moderate Assistance - Patient 50 - 74%    Lower Body Dressing/Undressing Lower body dressing      What is the patient wearing?: Incontinence brief     Lower body assist Assist for lower body dressing: Maximal Assistance - Patient 25 - 49%     Toileting Toileting Toileting Activity did not occur (Clothing management and hygiene only): N/A (no void or bm)  Toileting assist Assist for toileting: Total Assistance - Patient < 25%     Transfers Chair/bed transfer  Transfers assist     Chair/bed transfer assist level: Moderate Assistance -  Patient 50 - 74%     Locomotion Ambulation   Ambulation assist      Assist level: Minimal Assistance - Patient > 75% Assistive device: No Device Max distance: 36 ft   Walk 10 feet activity   Assist     Assist level: Minimal Assistance - Patient > 75% Assistive device: No Device   Walk 50 feet activity   Assist Walk 50 feet with 2 turns activity did not occur: Safety/medical concerns         Walk 150 feet activity   Assist Walk 150 feet activity did not occur: Safety/medical concerns         Walk 10 feet on uneven surface  activity   Assist Walk 10 feet on uneven surfaces activity did not occur: Safety/medical concerns         Wheelchair     Assist Is the patient using a wheelchair?: Yes Type of Wheelchair: Manual    Wheelchair  assist level: Minimal Assistance - Patient > 75% Max wheelchair distance: 52 ft    Wheelchair 50 feet with 2 turns activity    Assist        Assist Level: Minimal Assistance - Patient > 75%   Wheelchair 150 feet activity     Assist      Assist Level: Total Assistance - Patient < 25%   Blood pressure (!) 128/91, pulse 94, temperature 97.9 F (36.6 C), resp. rate 16, height 5\' 8"  (1.727 m), weight 57.4 kg, SpO2 95 %.  Medical Problem List and Plan: 1.  Left-sided weakness with dysarthria functional deficits secondary to right lacunar basal ganglia infarct             -patient may shower             -ELOS/Goals: 12-14 days, goals min assist with PT/OT/SLP  -Continue CIR therapies including PT, OT, and SLP  2.  Antithrombotics: -DVT/anticoagulation:  Pharmaceutical: Lovenox             -antiplatelet therapy: Aspirin 81 mg daily and Plavix 75 mg daily x3 weeks and aspirin alone 3. Pain Management: Tylenol as needed 4. Mood/sleep-wake:  less alert  -add amantadine for arousal 100mg  bid             -antipsychotic agents: N/A 5. Neuropsych: This patient is capable of making decisions on his own behalf. 6. Skin/Wound Care: Routine skin checks 7. Fluids/Electrolytes/Nutrition: now only on NGT feeds  -labs ok yesterday 8.  Dysphagia.  made NPO d/t poor tolerance yesterday, wet voice, etc  -maximize arousal  -continue rx per SLP 9.  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  Continue Vimpat 10.  History of alcohol tobacco polysubstance use.  Urine drug screen positive cocaine as well as marijuana.  Provide counseling as appropriate 11.  Hyperlipidemia.  Lipitor    LOS: 2 days A FACE TO FACE EVALUATION WAS PERFORMED  Meredith Staggers 02/08/2021, 10:53 AM

## 2021-02-09 DIAGNOSIS — F1910 Other psychoactive substance abuse, uncomplicated: Secondary | ICD-10-CM | POA: Diagnosis not present

## 2021-02-09 DIAGNOSIS — G40909 Epilepsy, unspecified, not intractable, without status epilepticus: Secondary | ICD-10-CM | POA: Diagnosis not present

## 2021-02-09 DIAGNOSIS — I639 Cerebral infarction, unspecified: Secondary | ICD-10-CM | POA: Diagnosis not present

## 2021-02-09 DIAGNOSIS — I69322 Dysarthria following cerebral infarction: Secondary | ICD-10-CM | POA: Diagnosis not present

## 2021-02-09 LAB — URINE CULTURE

## 2021-02-09 LAB — PHOSPHORUS: Phosphorus: 3.5 mg/dL (ref 2.5–4.6)

## 2021-02-09 LAB — MAGNESIUM: Magnesium: 2.4 mg/dL (ref 1.7–2.4)

## 2021-02-09 MED ORDER — ENSURE ENLIVE PO LIQD
237.0000 mL | Freq: Three times a day (TID) | ORAL | Status: DC
  Administered 2021-02-09 – 2021-02-20 (×28): 237 mL via ORAL

## 2021-02-09 NOTE — Progress Notes (Signed)
Physical Therapy Session Note  Patient Details  Name: Douglas Spencer MRN: [id number] Date of Birth: 17-Oct-1945  Today's Date: 02/09/2021 PT Individual Time: 0947-1030 PT Individual Time Calculation (min): 43 min   Short Term Goals: Week 1:  PT Short Term Goal 1 (Week 1): Patient will perborm basic transfers with CGA using LRAD. PT Short Term Goal 2 (Week 1): Patient will ambulate >100 feet with CGA using LRAD. PT Short Term Goal 3 (Week 1): Patient will ascend/descend 6 steps with L rail with min A-CGA simulating home set-up. PT Short Term Goal 4 (Week 1): Patient will improve Berg Balance Scale score by MDC (7 points)  Skilled Therapeutic Interventions/Progress Updates: Pt presents sitting in w/c and agreeable to therapy.  Pt able to doff slipper socks using Figure-4 position, but total A for doffing socks and shoes for time conservation.  PT wheeled to dayroom for time conservation.  Pt transferred sit to stand w/ min A.  Pt negotiated through cone obstacle course w/ noted decreased BOS and scissoring, w/ turns to left.  Pt performed cone obstacle course and toe tap to cone w/ alternating feet, mod A for LOB, verbal cues for safety and speed.  Pt negotiated course and picking up cones singly and taking to table.  Increased LOB w/ impulsive turning and poor LLE placement.  Pt returned to room and remained sitting in w/c w/ chair alarm on and all needs in reach.     Therapy Documentation Precautions:  Precautions Precautions: Fall Precaution Comments: L inattention Restrictions Weight Bearing Restrictions: No General:   Vital Signs:  Pain:0/10 Pain Assessment Pain Scale: 0-10 Pain Score: 0-No pain Mobility:   Locomotion :    Trunk/Postural Assessment :    Balance:   Exercises:   Other Treatments:      Therapy/Group: Individual Therapy  Ladoris Gene 02/09/2021, 10:30 AM

## 2021-02-09 NOTE — Progress Notes (Signed)
PROGRESS NOTE   Subjective/Complaints: Pt says he slept. Happy to be back on diet. Says he's had a long term, chronic cough. Cough has continued here.  ROS: Patient denies fever, rash, sore throat, blurred vision, nausea, vomiting, diarrhea,   shortness of breath or chest pain, joint or back pain, headache, or mood change.    Objective:   No results found. Recent Labs    02/07/21 0510  WBC 9.8  HGB 16.1  HCT 46.0  PLT 191   Recent Labs    02/07/21 0510  NA 138  K 4.1  CL 105  CO2 24  GLUCOSE 98  BUN 18  CREATININE 0.89  CALCIUM 8.9    Intake/Output Summary (Last 24 hours) at 02/09/2021 1046 Last data filed at 02/09/2021 0829 Gross per 24 hour  Intake 120 ml  Output 250 ml  Net -130 ml        Physical Exam: Vital Signs Blood pressure (!) 145/74, pulse 68, temperature (!) 97.5 F (36.4 C), temperature source Oral, resp. rate 18, height 5\' 8"  (1.727 m), weight 57.4 kg, SpO2 100 %.  Constitutional: No distress . Vital signs reviewed. HEENT: NCAT, EOMI, oral membranes moist, NGT Neck: supple Cardiovascular: RRR without murmur. No JVD    Respiratory/Chest: scattered upper airways sounds. Non-labored   GI/Abdomen: BS +, non-tender, non-distended Ext: no clubbing, cyanosis, or edema Psych: pleasant and cooperative  Skin: Clean and intact without signs of breakdown Neuro:  more alert. Still dysarthric. Voice a little wet. Right gaze preference but can be cued to left. Left central 7 and tongue deviation. LUE 1+ pec, 2+ biceps, trace hand. LLE 4/5 prox to distal. Decreased LT left arm>leg.  Musculoskeletal: no focal pain, normal rom    Assessment/Plan: 1. Functional deficits which require 3+ hours per day of interdisciplinary therapy in a comprehensive inpatient rehab setting. Physiatrist is providing close team supervision and 24 hour management of active medical problems listed below. Physiatrist and  rehab team continue to assess barriers to discharge/monitor patient progress toward functional and medical goals  Care Tool:  Bathing    Body parts bathed by patient: Left arm, Chest, Abdomen, Front perineal area, Right upper leg, Left upper leg, Face   Body parts bathed by helper: Right arm, Buttocks, Right lower leg, Left lower leg     Bathing assist Assist Level: Moderate Assistance - Patient 50 - 74%     Upper Body Dressing/Undressing Upper body dressing   What is the patient wearing?: Pull over shirt    Upper body assist Assist Level: Moderate Assistance - Patient 50 - 74%    Lower Body Dressing/Undressing Lower body dressing      What is the patient wearing?: Incontinence brief     Lower body assist Assist for lower body dressing: Maximal Assistance - Patient 25 - 49%     Toileting Toileting Toileting Activity did not occur (Clothing management and hygiene only): N/A (no void or bm)  Toileting assist Assist for toileting: Minimal Assistance - Patient > 75%     Transfers Chair/bed transfer  Transfers assist     Chair/bed transfer assist level: Minimal Assistance - Patient > 75%  Locomotion Ambulation   Ambulation assist      Assist level: Minimal Assistance - Patient > 75% Assistive device: No Device Max distance: 50   Walk 10 feet activity   Assist     Assist level: Minimal Assistance - Patient > 75% Assistive device: No Device   Walk 50 feet activity   Assist Walk 50 feet with 2 turns activity did not occur: Safety/medical concerns  Assist level: Minimal Assistance - Patient > 75% Assistive device: No Device    Walk 150 feet activity   Assist Walk 150 feet activity did not occur: Safety/medical concerns  Assist level: Minimal Assistance - Patient > 75% Assistive device: No Device    Walk 10 feet on uneven surface  activity   Assist Walk 10 feet on uneven surfaces activity did not occur: Safety/medical concerns          Wheelchair     Assist Is the patient using a wheelchair?: Yes Type of Wheelchair: Manual    Wheelchair assist level: Minimal Assistance - Patient > 75% Max wheelchair distance: 52 ft    Wheelchair 50 feet with 2 turns activity    Assist        Assist Level: Minimal Assistance - Patient > 75%   Wheelchair 150 feet activity     Assist      Assist Level: Total Assistance - Patient < 25%   Blood pressure (!) 145/74, pulse 68, temperature (!) 97.5 F (36.4 C), temperature source Oral, resp. rate 18, height 5\' 8"  (1.727 m), weight 57.4 kg, SpO2 100 %.  Medical Problem List and Plan: 1.  Left-sided weakness with dysarthria functional deficits secondary to right lacunar basal ganglia infarct             -patient may shower             -ELOS/Goals: 12-14 days, goals min assist with PT/OT/SLP  -Continue CIR therapies including PT, OT, and SLP   2.  Antithrombotics: -DVT/anticoagulation:  Pharmaceutical: Lovenox             -antiplatelet therapy: Aspirin 81 mg daily and Plavix 75 mg daily x3 weeks and aspirin alone 3. Pain Management: Tylenol as needed 4. Mood/sleep-wake:  less alert  -added amantadine for arousal 100mg  bid--seems to have helped             -antipsychotic agents: N/A 5. Neuropsych: This patient is capable of making decisions on his own behalf. 6. Skin/Wound Care: Routine skin checks 7. Fluids/Electrolytes/Nutrition: now only on NGT feeds  -labs ok   12/30-if he eats 50% or greater of meals today and there are no concerns about aspiration, can dc NGT 12/31 8.  Dysphagia.  pt with apparent chronic cough, tolerated purees and nectars with SLP---> now on D1/nectar diet  -continue amantadine for arousal  -SLP following 9.  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  Continue Vimpat 10.  History of alcohol tobacco polysubstance use.  Urine drug screen positive cocaine as well as marijuana.  Provide counseling as appropriate 11.  Hyperlipidemia.  Lipitor     LOS: 3 days A FACE TO FACE EVALUATION WAS PERFORMED  Meredith Staggers 02/09/2021, 10:46 AM

## 2021-02-09 NOTE — Progress Notes (Signed)
Occupational Therapy Session Note  Patient Details  Name: Douglas Spencer MRN: [id number] Date of Birth: 07-12-1945   Session 1 Today's Date: 02/09/2021 OT Individual Time: 2947-6546 OT Individual Time Calculation (min): 57 min   Session 2  Today's Date: 02/09/2021 OT Individual Time: 1300-1359 OT Individual Time Calculation (min): 59 min    Short Term Goals: Week 1:  OT Short Term Goal 1 (Week 1): Patient will don UB clothing with Min A and good carryover of hemi technique. OT Short Term Goal 2 (Week 1): Patient will complete 2/3 parts of toileting task with Min A. OT Short Term Goal 3 (Week 1): Patient will locate 3 ADL items on L side of sink surface with supervision A and good carryover of compensatory visual techniques. OT Short Term Goal 4 (Week 1): Patient will complete toilet transfer with Min A and LRAD.   Skilled Therapeutic Interventions/Progress Updates:    Pt received from SLP supervising consumption of breakfast. Pt with intermittent coughing, SLP providing edu that this is baseline and likely not associated with any aspiration event. Pt continued eating breakfast, able to alternate between talking with OT and eating but appropriately waiting until food was not in his mouth. He completed bed mobility with min A and stand pivot transfer to the w/c with CGA. He completed oral care with min A for inclusion of the LUE as a gross stabilizer. Fixodent provided for dentures to have better grip. He completed peri care in standing with CGA while OT donned new brief. Pt completed 300 ft of functional mobility with min A - CGA, great pace and endurance, cueing required for stride length, LLE positioning, and gaze forward. He returned to his w/c and was given cut up foam blocks for a LUE exercise to do between therapy sessions. Chair alarm set and all needs met.    Session 2 Pt sitting in chair requesting to take shower. Ambulatory transfer to the bathroom with CGA overall. Poor  sequencing to sit on TTB. Edu provided re fall risk reduction strategies. He completed bathing seated on the TTB with assist required for forced use of the LUE. Once cued for attention and min A provided for positioning his LUE was able to wash his RUE with min A overall. He stood with min A and used grab bar for balance support while he completed peri hygiene. He transferred back to the w/c following the shower with CGA. Motor planning deficits evident during dressing, especially when teaching hemi dressing techniques. Mod A to don shirt, pants, and socks. He completed functional mobility to the therapy gym with min A, cueing for L foot clearance. He completed gravity reduced LUE shoulder flexion/extension with great activation. He demo-ed poor sustained attention to task, esp to the L, frequent cueing required. He returned to his room and was left sitting up with all needs met, chair alarm set.   Therapy Documentation Precautions:  Precautions Precautions: Fall Precaution Comments: L inattention Restrictions Weight Bearing Restrictions: No  Therapy/Group: Individual Therapy  Curtis Sites 02/09/2021, 6:31 AM

## 2021-02-09 NOTE — Progress Notes (Signed)
Speech Language Pathology Daily Session Note  Patient Details  Name: Douglas Spencer MRN: [id number] Date of Birth: August 21, 1945  Today's Date: 02/09/2021 SLP Individual Time: 4742-5956 SLP Individual Time Calculation (min): 45 min  Short Term Goals: Week 1: SLP Short Term Goal 1 (Week 1): Pt will tolerate therapeutic trials of ice chips, NTL and puree solids with SLP only to determine readiness for MBS and/or diet upgrade provided mod A cues for swallow strategies SLP Short Term Goal 2 (Week 1): Pt will increase awareness into changes s/p CVA by identifying 3 cognitive and 3 physical impairments with mod A SLP Short Term Goal 3 (Week 1): Pt will increase recall of functional information within and between treatment sessions with mod A multimodal cues SLP Short Term Goal 4 (Week 1): Pt will increase awareness of and compensate for L anterior loss of secretions provided mod A verbal and visual cues SLP Short Term Goal 5 (Week 1): Patient will demonstrate functional problem solving for basic and familiar tasks with mod verbal cues.  Skilled Therapeutic Interventions:   Patient seen for skilled ST session focusing on swallow function goals. Patient in bed with breakfast meal on tray but untouched as patient had been awaiting assistance. SLP setup meal tray and patient then able to feed self puree solids and nectar thick liquids. He exhibited a few instances of wet, congested sounding voice which eventually cleared. He exhibited one instance of delayed coughing approximately half-way through meal. Anterior spillage of liquids and solids was reduced as compared to previous session. Patient very pleased to be having food and did state that he eats a lot of very soft foods, pureed foods at home. SLP is recommending to continue with current PO diet with plans for repeat MBS prior to further upgrades. (Also recommend waiting for repeat MBS until after Cortrak feeding tube is removed. Patient left in bed with  all needs within reach and OT in room for next session. He continues to benefit from skilled SLP intervention to maximize cognitive and swallow function prior to discharge.  Pain Pain Assessment Pain Scale: 0-10 Pain Score: 0-No pain  Therapy/Group: Individual Therapy  Sonia Baller, MA, CCC-SLP Speech Therapy

## 2021-02-09 NOTE — Progress Notes (Signed)
Spoke with patient about history of aspiration and coughing. Pt explained that he has this horrible wet cough his entire life "since he was a baby" and that it "sounds worse then what it is". Patient stated even though he is coughing he doesn't feel as if there is any mucus that needs to be cleared. Pt stated that he knew how to use the suction but when asked to demonstrate, just put the wand in his mouth. This nurse educated patient on how to turn on/off the wand and how to use properly. Pt agreed and understands. Small clear secretion was suctioned while nurse in room. Staff should encourage suction when in room. This nurse also noticed that patient became a little clearer after taking sips of nectar thick water. Pt denies pain at this time, all needs are met, bed at lowest position, call light within reach.

## 2021-02-09 NOTE — Progress Notes (Signed)
Nutrition Follow-up  DOCUMENTATION CODES:   Not applicable  INTERVENTION:  Provide Ensure Enlive po TID (thickened to appropriate consistency), each supplement provides 350 kcal and 20 grams of protein.  Encourage adequate PO intake.   NUTRITION DIAGNOSIS:   Inadequate oral intake related to inability to eat as evidenced by NPO status; diet advanced; progressing  GOAL:   Patient will meet greater than or equal to 90% of their needs; progressing  MONITOR:   PO intake, Supplement acceptance, Diet advancement, Labs, Weight trends, Skin, I & O's  REASON FOR ASSESSMENT:   Consult Enteral/tube feeding initiation and management  ASSESSMENT:   75 year old male who presented to the ED on 12/20 as a Code Stroke. Pt received TNKase in the ED. PMH of EtOH abuse (stopped drinking in January 2022), polysubstance abuse, smoking, seizures. Pt admitted with R lacunar basal ganglia infarct likely secondary to small vessel disease 12/23 - Cortrak placed (tip gastric)   Pt on dysphagia 1 diet with nectar thick liquids. Meal completion 50-100%. Tube feeding orders discontinued yesterday. Cortrak NGT remains in place. Per MD, if po intake at meals remain 50% or more, may discontinue NGT tomorrow. Pt reports having a good appetite currently. RD to order Ensure to aid in caloric and protein needs.   Labs and medications reviewed.   Diet Order:   Diet Order             DIET - DYS 1 Room service appropriate? Yes; Fluid consistency: Nectar Thick  Diet effective now                   EDUCATION NEEDS:   No education needs have been identified at this time  Skin:  Skin Assessment: Reviewed RN Assessment  Last BM:  PTA  Height:   Ht Readings from Last 1 Encounters:  02/06/21 5\' 8"  (1.727 m)    Weight:   Wt Readings from Last 1 Encounters:  02/09/21 57.4 kg   BMI:  Body mass index is 19.24 kg/m.  Estimated Nutritional Needs:   Kcal:  1900-2100  Protein:  90-105  grams  Fluid:  >1.9 L  Corrin Parker, MS, RD, LDN RD pager number/after hours weekend pager number on Amion.

## 2021-02-09 NOTE — IPOC Note (Signed)
Overall Plan of Care [facility]) Patient Details Name: Douglas Spencer MRN: [id number] DOB: 1945-06-21  Admitting Diagnosis: Infarction of right basal ganglia [facility])  Hospital Problems: Principal Problem:   Infarction of right basal ganglia ([place])     Functional Problem List: Nursing Bowel, Edema, Endurance, Medication Management, Nutrition, Safety, Perception  PT Balance, Perception, Safety, Behavior, Edema, Sensory, Skin Integrity, Endurance, Motor, Nutrition, Pain  OT Balance, Cognition, Endurance, Motor, Nutrition, Safety, Sensory, Vision  SLP Behavior, Cognition, Endurance, Safety, Nutrition  TR         Basic ADLs: OT Eating, Bathing, Grooming, Dressing, Toileting     Advanced  ADLs: OT       Transfers: PT Bed Mobility, Bed to Chair, Musician, Manufacturing systems engineer, Metallurgist: PT Ambulation, Emergency planning/management officer, Stairs     Additional Impairments: OT Fuctional Use of Upper Extremity  SLP Swallowing, Social Cognition   Problem Solving, Attention, Awareness, Memory  TR      Anticipated Outcomes Item Anticipated Outcome  Self Feeding Set-up  Swallowing  min A   Basic self-care  Supervision  Toileting  Supervision   Bathroom Transfers Supervision  Bowel/Bladder  min assist  Transfers  mod I using LRAD  Locomotion  Supervision using LRAD  Communication  Mod I  Cognition  Supervision  Pain  n/a  Safety/Judgment  min assist with no falls   Therapy Plan: PT Intensity: Minimum of 1-2 x/day ,45 to 90 minutes PT Frequency: 5 out of 7 days PT Duration Estimated Length of Stay: 12-14 days OT Intensity: Minimum of 1-2 x/day, 45 to 90 minutes OT Frequency: 5 out of 7 days OT Duration/Estimated Length of Stay: 2 weeks SLP Intensity: Minumum of 1-2 x/day, 30 to 90 minutes SLP Frequency: 3 to 5 out of 7 days SLP Duration/Estimated Length of Stay: 12-14 days   Due to the current state of emergency, patients may not be receiving their 3-hours of  Medicare-mandated therapy.   Team Interventions: Nursing Interventions Patient/Family Education, Bowel Management, Disease Management/Prevention, Medication Management, Discharge Planning, Dysphagia/Aspiration Precaution Training  PT interventions Ambulation/gait training, Cognitive remediation/compensation, Discharge planning, DME/adaptive equipment instruction, Functional mobility training, Pain management, Psychosocial support, Splinting/orthotics, UE/LE Strength taining/ROM, Therapeutic Activities, Visual/perceptual remediation/compensation, Wheelchair propulsion/positioning, UE/LE Coordination activities, Therapeutic Exercise, Stair training, Skin care/wound management, Patient/family education, Neuromuscular re-education, Functional electrical stimulation, Disease management/prevention, Academic librarian, Training and development officer  OT Interventions Training and development officer, Cognitive remediation/compensation, Academic librarian, Discharge planning, DME/adaptive equipment instruction, Functional electrical stimulation, Functional mobility training, Neuromuscular re-education, Patient/family education, Psychosocial support, Self Care/advanced ADL retraining, Therapeutic Activities, Therapeutic Exercise, UE/LE Strength taining/ROM, UE/LE Coordination activities, Visual/perceptual remediation/compensation  SLP Interventions Cognitive remediation/compensation, Cueing hierarchy, Dysphagia/aspiration precaution training, Functional tasks, Patient/family education, Therapeutic Activities, Therapeutic Exercise, Speech/Language facilitation, Oral motor exercises, Internal/external aids  TR Interventions    SW/CM Interventions Discharge Planning, Patient/Family Education, Psychosocial Support   Barriers to Discharge MD  Medical stability  Nursing Decreased caregiver support, Home environment access/layout, Incontinence, Lack of/limited family support, Weight, Medication compliance, Nutrition  means Lives in 2 level home with 1 step and no rails. Able to live on main level with bedroom/bathroom. Partner can provide min assist at discharge.  PT Home environment access/layout, Incontinence, Behavior    OT Inaccessible home environment, Home environment access/layout    SLP      SW Decreased caregiver support, Lack of/limited family support     Team Discharge Planning: Destination: PT-Home ,OT- Home , SLP-Home Projected Follow-up: PT-Outpatient PT, OT-  Home health OT, SLP-Home Health  SLP, Outpatient SLP Projected Equipment Needs: PT-To be determined, OT- To be determined, SLP-None recommended by SLP Equipment Details: PT- , OT-  Patient/family involved in discharge planning: PT- Patient,  OT-Patient, SLP-Patient  MD ELOS: 12-14 days Medical Rehab Prognosis:  Excellent Assessment: The patient has been admitted for CIR therapies with the diagnosis of right basal ganglia infarct. The team will be addressing functional mobility, strength, stamina, balance, safety, adaptive techniques and equipment, self-care, bowel and bladder mgt, patient and caregiver education, swallowing, cognition, speech, community reentr. Goals have been set at supervision to mod I with basic self-care, mobility and speech/swallowing/cogniiton.   Due to the current state of emergency, patients may not be receiving their 3 hours per day of Medicare-mandated therapy.    Meredith Staggers, MD, FAAPMR     See Team Conference Notes for weekly updates to the plan of care

## 2021-02-10 DIAGNOSIS — I639 Cerebral infarction, unspecified: Secondary | ICD-10-CM | POA: Diagnosis not present

## 2021-02-10 MED ORDER — CALCIUM CARBONATE ANTACID 500 MG PO CHEW
1.0000 | CHEWABLE_TABLET | Freq: Two times a day (BID) | ORAL | Status: DC | PRN
  Administered 2021-02-10 – 2021-02-14 (×2): 200 mg via ORAL
  Filled 2021-02-10 (×2): qty 1

## 2021-02-10 NOTE — Progress Notes (Signed)
Cortrak removed per order, patient tolerated well.   Douglas Spencer

## 2021-02-10 NOTE — Progress Notes (Signed)
Occupational Therapy Session Note  Patient Details  Name: Douglas Spencer MRN: [id number] Date of Birth: July 17, 1945  Today's Date: 02/10/2021 OT Individual Time: 1300-1415 OT Individual Time Calculation (min): 75 min    Short Term Goals: Week 1:  OT Short Term Goal 1 (Week 1): Patient will don UB clothing with Min A and good carryover of hemi technique. OT Short Term Goal 2 (Week 1): Patient will complete 2/3 parts of toileting task with Min A. OT Short Term Goal 3 (Week 1): Patient will locate 3 ADL items on L side of sink surface with supervision A and good carryover of compensatory visual techniques. OT Short Term Goal 4 (Week 1): Patient will complete toilet transfer with Min A and LRAD.  Skilled Therapeutic Interventions/Progress Updates:  Pt greeted seated in w/c  agreeable to OT intervention. Session focus on LUE AROM, LUE FMC/ functional grasp, functional mobility, dynamic standing balance and decreasing overall caregiver burden.  Pt declined need for ADLs. Pt transported to gym with total A. Pt completed active assist ROM with LUE positioned on slanted stool and pt instructed to push stool forwards back backwards, pt completed task x20 reps 2-/5.  Worked on L shoulder ROM via horizontal ABD/ADD, scapular protraction/retraction, and circumduction with L hand placed on towel and pt instructed to slide towel R and L, forward/backwards and in circles. Graded task up and had pt complete task in standing with CGA. Worked on gross grasp with jenga blocks, clothespins and cup with pt having the most success with grasp cup with pt using mostly the first and second fingers to grasp. Education provided on using both hands to hold cups to promote bilateral integration. Issued pt foam block to work on functional grasp in L hand. Pt transported back to room with total A where pt continued to work on active shoulder ROM with gravity eliminated and pt completing x20 reps of elbow flexion/extension 2-/5.  Education provided on general stroke recovery, self ROM and positioning with pt verbalizing understanding. pt left seated in w/c with alarm belt activated and all needs within reach.                      Therapy Documentation Precautions:  Precautions Precautions: Fall Precaution Comments: L inattention Restrictions Weight Bearing Restrictions: No   Pain: no pain reported during session    Therapy/Group: Individual Therapy  Precious Haws 02/10/2021, 3:21 PM

## 2021-02-10 NOTE — Progress Notes (Signed)
Physical Therapy Session Note  Patient Details  Name: Douglas Spencer MRN: [id number] Date of Birth: [birth year]  Today's Date: 02/10/2021 PT Individual Time: 0902-0959 PT Individual Time Calculation (min): 57 min   Short Term Goals: Week 1:  PT Short Term Goal 1 (Week 1): Patient will perborm basic transfers with CGA using LRAD. PT Short Term Goal 2 (Week 1): Patient will ambulate >100 feet with CGA using LRAD. PT Short Term Goal 3 (Week 1): Patient will ascend/descend 6 steps with L rail with min A-CGA simulating home set-up. PT Short Term Goal 4 (Week 1): Patient will improve Berg Balance Scale score by MDC (7 points)  Skilled Therapeutic Interventions/Progress Updates:     Pt received seated in [facility] and agrees to therapy. No complaint of pain. PT provides pt with washcloth and provides cues for wiping face on L side of chin. WC transport to gym for time management. Pt performs multiple reps of sit to stand during session, requiring CGA to minA, depending on sequencing and adequately shifting weight forward. Pt ambulates x150' with light minA and cues to increase stride length on L for symmetrical reciprocal gait pattern. Following seated rest break, pt performs NMR for standing balance with toe taps on 6 inch step. Pt requires minA with multiple slight LOBs backward. Mirror then provided for visual feedback. Pt then performs with improved balance and sequencing, with PT providing intermittent step-by-step multimodal cueing. Pt performs alternating toe taps, repeated toe taps with L and then R lower extremity, and then step ups with L leg leading to promote increased strengthening and NM feedback, then stepping back with R foot first. Pt generally requires light minA to facilitate stability as well as lateral weight shifting.  Pt performs sit to stand with PT cueing to place L hand over L distal thigh and R hand over L to promote L lateral weight shifting and anterior weight transition. Pt  performs sit to stand with minA. Pt ambulates x15' to Nustep with CGA and cues for positioning. Pt completes Nustep for primarily for reciprocal coordination training but also for strength and endurance training. Pt has difficulty maintaining L grip so PT ace wraps L hand to facilitate grip and provide NM feedback. Pt completes at workload of 4 for 10:00 with average steps per minute ~40. Stand step transfer back to [facility] with CGA. Pt left seated in WC with alarm intact and all needs within reach.   Therapy Documentation Precautions:  Precautions Precautions: Fall Precaution Comments: L inattention Restrictions Weight Bearing Restrictions: No   Therapy/Group: Individual Therapy  Breck Coons, PT, DPT 02/10/2021, 12:57 PM

## 2021-02-10 NOTE — Progress Notes (Signed)
Speech Language Pathology Daily Session Note  Patient Details  Name: Douglas Spencer MRN: [id number] Date of Birth: [birth year]  Today's Date: 02/10/2021 SLP Individual Time: 4098-1191 SLP Individual Time Calculation (min): 45 min  Short Term Goals: Week 1: SLP Short Term Goal 1 (Week 1): Pt will tolerate therapeutic trials of ice chips, NTL and puree solids with SLP only to determine readiness for MBS and/or diet upgrade provided mod A cues for swallow strategies SLP Short Term Goal 2 (Week 1): Pt will increase awareness into changes s/p CVA by identifying 3 cognitive and 3 physical impairments with mod A SLP Short Term Goal 3 (Week 1): Pt will increase recall of functional information within and between treatment sessions with mod A multimodal cues SLP Short Term Goal 4 (Week 1): Pt will increase awareness of and compensate for L anterior loss of secretions provided mod A verbal and visual cues SLP Short Term Goal 5 (Week 1): Patient will demonstrate functional problem solving for basic and familiar tasks with mod verbal cues.  Skilled Therapeutic Interventions: Pt seen for skilled ST with focus on cognitive and swallowing goals, pt in bed having finished AM meal. Pt continues to endorse constant coughing/spluttering with PO intake at home ("since I was a baby") and pureeing food at baseline. Pt reports eating 100% of breakfast, having mixed together eggs, sausage and grits with syrup. Pt requesting coffee, SLP thickening to nectar consistently and pt consuming with occasional s/s aspiration (coughing and wet vocal quality). Pt benefits from overall min A cues for use of swallow precautions during intake. Pt demonstrates increased awareness into changes following CVA as well as recommended lifestyle changes. Pt reports desire to stop smoking, plans to be compliant with medication and monitoring blood pressure at home. Pt motivated to regain independence, requires overall min A verbal cues for  simple problem solving this date. Pt appreciative for therapy session. Pt left in bed with alarm set and all needs within reach. Cont ST POC.   Pain Pain Assessment Pain Scale: 0-10 Pain Score: 0-No pain  Therapy/Group: Individual Therapy  Dewaine Conger 02/10/2021, 12:24 PM

## 2021-02-10 NOTE — Progress Notes (Signed)
PROGRESS NOTE   Subjective/Complaints: Asks if NGT can be removed- att 100% of food despite not liking D1 diet as is eager to get NGT removed- placed order for d/c Asks for outpatient dermatology referral regarding facial lesion  ROS: Patient denies fever, rash, sore throat, blurred vision, nausea, vomiting, diarrhea,   shortness of breath or chest pain, joint or back pain, headache, or mood change.    Objective:   No results found. No results for input(s): WBC, HGB, HCT, PLT in the last 72 hours.  No results for input(s): NA, K, CL, CO2, GLUCOSE, BUN, CREATININE, CALCIUM in the last 72 hours.   Intake/Output Summary (Last 24 hours) at 02/10/2021 1258 Last data filed at 02/10/2021 1238 Gross per 24 hour  Intake 594 ml  Output 450 ml  Net 144 ml        Physical Exam: Vital Signs Blood pressure 134/81, pulse 71, temperature 97.7 F (36.5 C), temperature source Oral, resp. rate 18, height 5\' 8"  (1.727 m), weight 57.4 kg, SpO2 98 %. Gen: no distress, normal appearing HEENT: oral mucosa pink and moist, NCAT Cardio: Reg rate Chest: normal effort, normal rate of breathing Abd: soft, non-distended Ext: no edema Psych: pleasant, normal affect Skin: Clean and intact without signs of breakdown Neuro:  more alert. Still dysarthric. Voice a little wet. Right gaze preference but can be cued to left. Left central 7 and tongue deviation. LUE 1+ pec, 2+ biceps, trace hand. LLE 4/5 prox to distal. Decreased LT left arm>leg.  Musculoskeletal: no focal pain, normal rom    Assessment/Plan: 1. Functional deficits which require 3+ hours per day of interdisciplinary therapy in a comprehensive inpatient rehab setting. Physiatrist is providing close team supervision and 24 hour management of active medical problems listed below. Physiatrist and rehab team continue to assess barriers to discharge/monitor patient progress toward functional  and medical goals  Care Tool:  Bathing    Body parts bathed by patient: Left arm, Chest, Abdomen, Front perineal area, Right upper leg, Left upper leg, Face, Right arm, Buttocks, Left lower leg, Right lower leg   Body parts bathed by helper: Right arm, Buttocks, Right lower leg, Left lower leg     Bathing assist Assist Level: Minimal Assistance - Patient > 75%     Upper Body Dressing/Undressing Upper body dressing   What is the patient wearing?: Pull over shirt    Upper body assist Assist Level: Moderate Assistance - Patient 50 - 74%    Lower Body Dressing/Undressing Lower body dressing      What is the patient wearing?: Incontinence brief, Pants     Lower body assist Assist for lower body dressing: Moderate Assistance - Patient 50 - 74%     Toileting Toileting Toileting Activity did not occur Landscape architect and hygiene only): N/A (no void or bm)  Toileting assist Assist for toileting: Minimal Assistance - Patient > 75%     Transfers Chair/bed transfer  Transfers assist     Chair/bed transfer assist level: Minimal Assistance - Patient > 75%     Locomotion Ambulation   Ambulation assist      Assist level: Minimal Assistance - Patient > 75% Assistive  device: No Device Max distance: 50   Walk 10 feet activity   Assist     Assist level: Minimal Assistance - Patient > 75% Assistive device: No Device   Walk 50 feet activity   Assist Walk 50 feet with 2 turns activity did not occur: Safety/medical concerns  Assist level: Minimal Assistance - Patient > 75% Assistive device: No Device    Walk 150 feet activity   Assist Walk 150 feet activity did not occur: Safety/medical concerns  Assist level: Minimal Assistance - Patient > 75% Assistive device: No Device    Walk 10 feet on uneven surface  activity   Assist Walk 10 feet on uneven surfaces activity did not occur: Safety/medical concerns         Wheelchair     Assist Is  the patient using a wheelchair?: Yes Type of Wheelchair: Manual    Wheelchair assist level: Minimal Assistance - Patient > 75% Max wheelchair distance: 52 ft    Wheelchair 50 feet with 2 turns activity    Assist        Assist Level: Minimal Assistance - Patient > 75%   Wheelchair 150 feet activity     Assist      Assist Level: Total Assistance - Patient < 25%   Blood pressure 134/81, pulse 71, temperature 97.7 F (36.5 C), temperature source Oral, resp. rate 18, height 5\' 8"  (1.727 m), weight 57.4 kg, SpO2 98 %.  Medical Problem List and Plan: 1.  Left-sided weakness with dysarthria functional deficits secondary to right lacunar basal ganglia infarct             -patient may shower             -ELOS/Goals: 12-14 days, goals min assist with PT/OT/SLP  Continue CIR therapies including PT, OT, and SLP   2.  Antithrombotics: -DVT/anticoagulation:  Pharmaceutical: Lovenox             -antiplatelet therapy: Aspirin 81 mg daily and Plavix 75 mg daily x3 weeks and aspirin alone 3. Pain Management: Tylenol as needed 4. Mood/sleep-wake:  less alert  -added amantadine for arousal 100mg  bid--seems to have helped             -antipsychotic agents: N/A 5. Neuropsych: This patient is capable of making decisions on his own behalf. 6. Skin/Wound Care: Routine skin checks 7. Fluids/Electrolytes/Nutrition:   -labs ok  8.  Dysphagia.  pt with apparent chronic cough, tolerated purees and nectars with SLP---> now on D1/nectar diet  -continue amantadine for arousal  -SLP following  D/c NGT 9.  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  Continue Vimpat 10.  History of alcohol tobacco polysubstance use.  Urine drug screen positive cocaine as well as marijuana.  Provide counseling as appropriate 11.  Hyperlipidemia.  Lipitor 12. Facial lesion: outpatient referral to dermatology upon d/c    LOS: 4 days A FACE TO FACE EVALUATION WAS PERFORMED  Clide Deutscher [place] 02/10/2021, 12:58 PM

## 2021-02-11 DIAGNOSIS — I639 Cerebral infarction, unspecified: Secondary | ICD-10-CM | POA: Diagnosis not present

## 2021-02-11 NOTE — Progress Notes (Signed)
PROGRESS NOTE   Subjective/Complaints: He is very happy that NGT has been removed. He has been making his food palatable by mixing it together. Says he has always coughed and gagged on his foods since he was a child and it used to worry his mother He is happy we will get him dermatology follow-up for his facial lesion  ROS: Patient denies fever, rash, sore throat, blurred vision, nausea, vomiting, diarrhea,   shortness of breath or chest pain, joint or back pain, headache, or mood change.    Objective:   No results found. No results for input(s): WBC, HGB, HCT, PLT in the last 72 hours.  No results for input(s): NA, K, CL, CO2, GLUCOSE, BUN, CREATININE, CALCIUM in the last 72 hours.   Intake/Output Summary (Last 24 hours) at 02/11/2021 1038 Last data filed at 02/11/2021 0830 Gross per 24 hour  Intake 356 ml  Output 250 ml  Net 106 ml        Physical Exam: Vital Signs Blood pressure 124/77, pulse 65, temperature 97.9 F (36.6 C), temperature source Oral, resp. rate 18, height 5\' 8"  (1.727 m), weight 58.4 kg, SpO2 97 %. Gen: no distress, normal appearing HEENT: oral mucosa pink and moist, NCAT Cardio: Reg rate Chest: normal effort, normal rate of breathing Abd: soft, non-distended Ext: no edema Psych: pleasant, normal affect Skin: facial lesion Neuro:  more alert. Still dysarthric. Voice a little wet. Right gaze preference but can be cued to left. Left central 7 and tongue deviation. LUE 1+ pec, 2+ biceps, trace hand. LLE 4/5 prox to distal. Decreased LT left arm>leg.  Musculoskeletal: no focal pain, normal rom    Assessment/Plan: 1. Functional deficits which require 3+ hours per day of interdisciplinary therapy in a comprehensive inpatient rehab setting. Physiatrist is providing close team supervision and 24 hour management of active medical problems listed below. Physiatrist and rehab team continue to assess barriers  to discharge/monitor patient progress toward functional and medical goals  Care Tool:  Bathing    Body parts bathed by patient: Left arm, Chest, Abdomen, Front perineal area, Right upper leg, Left upper leg, Face, Right arm, Buttocks, Left lower leg, Right lower leg   Body parts bathed by helper: Right arm, Buttocks, Right lower leg, Left lower leg     Bathing assist Assist Level: Minimal Assistance - Patient > 75%     Upper Body Dressing/Undressing Upper body dressing   What is the patient wearing?: Pull over shirt    Upper body assist Assist Level: Moderate Assistance - Patient 50 - 74%    Lower Body Dressing/Undressing Lower body dressing      What is the patient wearing?: Incontinence brief, Pants     Lower body assist Assist for lower body dressing: Moderate Assistance - Patient 50 - 74%     Toileting Toileting Toileting Activity did not occur Landscape architect and hygiene only): N/A (no void or bm)  Toileting assist Assist for toileting: Minimal Assistance - Patient > 75%     Transfers Chair/bed transfer  Transfers assist     Chair/bed transfer assist level: Minimal Assistance - Patient > 75%     Locomotion Ambulation  Ambulation assist      Assist level: Minimal Assistance - Patient > 75% Assistive device: No Device Max distance: 50   Walk 10 feet activity   Assist     Assist level: Minimal Assistance - Patient > 75% Assistive device: No Device   Walk 50 feet activity   Assist Walk 50 feet with 2 turns activity did not occur: Safety/medical concerns  Assist level: Minimal Assistance - Patient > 75% Assistive device: No Device    Walk 150 feet activity   Assist Walk 150 feet activity did not occur: Safety/medical concerns  Assist level: Minimal Assistance - Patient > 75% Assistive device: No Device    Walk 10 feet on uneven surface  activity   Assist Walk 10 feet on uneven surfaces activity did not occur: Safety/medical  concerns         Wheelchair     Assist Is the patient using a wheelchair?: Yes Type of Wheelchair: Manual    Wheelchair assist level: Minimal Assistance - Patient > 75% Max wheelchair distance: 52 ft    Wheelchair 50 feet with 2 turns activity    Assist        Assist Level: Minimal Assistance - Patient > 75%   Wheelchair 150 feet activity     Assist      Assist Level: Total Assistance - Patient < 25%   Blood pressure 124/77, pulse 65, temperature 97.9 F (36.6 C), temperature source Oral, resp. rate 18, height 5\' 8"  (1.727 m), weight 58.4 kg, SpO2 97 %.  Medical Problem List and Plan: 1.  Left-sided weakness with dysarthria functional deficits secondary to right lacunar basal ganglia infarct             -patient may shower             -ELOS/Goals: 12-14 days, goals min assist with PT/OT/SLP  Continue CIR therapies including PT, OT, and SLP   2.  Antithrombotics: -DVT/anticoagulation:  Pharmaceutical: Lovenox             -antiplatelet therapy: Aspirin 81 mg daily and Plavix 75 mg daily x3 weeks and aspirin alone 3. Pain Management: Tylenol as needed 4. Mood/sleep-wake:  less alert  -added amantadine for arousal 100mg  bid--seems to have helped             -antipsychotic agents: N/A 5. Neuropsych: This patient is capable of making decisions on his own behalf. 6. Skin/Wound Care: Routine skin checks 7. Fluids/Electrolytes/Nutrition:   -labs ok  8.  Dysphagia.  pt with apparent chronic cough, tolerated purees and nectars with SLP---> now on D1/nectar diet  -continue amantadine for arousal  -SLP following  D/c NGT 9.  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  continue Vimpat 10.  History of alcohol tobacco polysubstance use.  Urine drug screen positive cocaine as well as marijuana.  Provide counseling as appropriate 11.  Hyperlipidemia.  continue Lipitor 12. Facial lesion: outpatient referral to dermatology upon d/c    LOS: 5 days A FACE TO FACE EVALUATION  WAS PERFORMED  Clide Deutscher Bayla Mcgovern 02/11/2021, 10:38 AM

## 2021-02-12 DIAGNOSIS — I639 Cerebral infarction, unspecified: Secondary | ICD-10-CM | POA: Diagnosis not present

## 2021-02-12 NOTE — Progress Notes (Signed)
Physical Therapy Session Note  Patient Details  Name: Douglas Spencer MRN: [id number] Date of Birth: 1945/12/10  Today's Date: 02/12/2021 PT Individual Time: 1432-1530 PT Individual Time Calculation (min): 58 min   Short Term Goals: Week 1:  PT Short Term Goal 1 (Week 1): Patient will perborm basic transfers with CGA using LRAD. PT Short Term Goal 2 (Week 1): Patient will ambulate >100 feet with CGA using LRAD. PT Short Term Goal 3 (Week 1): Patient will ascend/descend 6 steps with L rail with min A-CGA simulating home set-up. PT Short Term Goal 4 (Week 1): Patient will improve Berg Balance Scale score by MDC (7 points)   Skilled Therapeutic Interventions/Progress Updates:  Patient seated upright in recliner on entrance to room. Patient alert and agreeable to PT session. RN arriving in room and pt requesting Adderax for depression/ anxiety.   Patient with no pain complaint throughout session.  Therapeutic Activity: Transfers: Patient performed sit<>stand and stand pivot transfers throughout session with CGA for safety/ balance without use of AD. Provided verbal cues for safe technique and hand placement.  Gait Training:  Patient ambulated 130' x2 using no AD with overall CGA and up to MinA to maintain balance with fatigue and melt to L side as well as narrowing of step width and crossover stepping. Provided vc/ tc throughout for conscious focus to LLE and need for higher knee flexion, larger step length/ height, heel strike, push off. With cues, pt demos improved quality of gait, but does require consistent cueing throughout to maintain.  Neuromuscular Re-ed: NMR facilitated during session with focus on standing balance, muscle activation, proprioception. Pt guided in continuous reciprocation of BUE and BLE using NuStep L3 x 65min with focus on L hemibody use. L hand grasp provided with assist for maintaining hold to handle throughout and assist to elbow and scapula for support and increased  coordinated movement. After 5 min, pt continues with BLE only for another 4 min with focus on full LLE extension. Pt states feeling as though he is using and activating the muscles in his L leg better than with any other exercise so far.   Pt guided in standing balance challenge using horseshoes to pt's L side and basketball hoop in front of pt. Pt requested to pick up horseshoe with R hand, hand to L hand and then reach forward with single step using LLE with active assist to hook horseshoe to rim of hoop. Pt requires vc for grasp of L hand, ModA for reach to hoop, MinA for maintaining balance especially with fatigue and strong melt to L side.   NMR performed for improvements in motor control and coordination, balance, sequencing, judgement, and self confidence/ efficacy in performing all aspects of mobility at highest level of independence.   Patient seated  in recliner at end of session with brakes locked, belt alarm set, and all needs within reach.    Therapy Documentation Precautions:  Precautions Precautions: Fall Precaution Comments: L inattention Restrictions Weight Bearing Restrictions: No General:   Vital Signs:  Pain:  No pain complaint this session.  Therapy/Group: Individual Therapy  Alger Simons PT, DPT 02/12/2021, 5:56 PM

## 2021-02-12 NOTE — Progress Notes (Signed)
Patient ID: Douglas Spencer, male   DOB: 08/24/1945, 76 y.o.   MRN: [id number]  SW left message for pt friend [name] [phone]) per his request to nursing.   Loralee Pacas, MSW, [place] Office: [phone] Cell: [phone] Fax: [phone]

## 2021-02-12 NOTE — Progress Notes (Signed)
Speech Language Pathology Daily Session Note  Patient Details  Name: Douglas Spencer MRN: [id number] Date of Birth: 12-Feb-1946  Today's Date: 02/12/2021 SLP Individual Time: 1000-1030 SLP Individual Time Calculation (min): 30 min  Short Term Goals: Week 1: SLP Short Term Goal 1 (Week 1): Pt will tolerate therapeutic trials of ice chips, NTL and puree solids with SLP only to determine readiness for MBS and/or diet upgrade provided mod A cues for swallow strategies SLP Short Term Goal 2 (Week 1): Pt will increase awareness into changes s/p CVA by identifying 3 cognitive and 3 physical impairments with mod A SLP Short Term Goal 3 (Week 1): Pt will increase recall of functional information within and between treatment sessions with mod A multimodal cues SLP Short Term Goal 4 (Week 1): Pt will increase awareness of and compensate for L anterior loss of secretions provided mod A verbal and visual cues SLP Short Term Goal 5 (Week 1): Patient will demonstrate functional problem solving for basic and familiar tasks with mod verbal cues.  Skilled Therapeutic Interventions: Pt seen for skilled ST with focus on swallowing and cognitive goals, pt upright in recliner and agreeable to therapeutic tasks. Pt Cortrak has been removed, observed with NTL via cup with significantly decreased L anterior loss compared with evaluation and previous txs. Pt with intermittent wet vocal quality which required cues to increase awareness of and clear effectively. Pt with one large coughing episode during trials which is states is his baseline. SLP administering the SLUMS with pt scoring 29/30, only error was recall of 4/5 words with delay. This is a significant improvement from score of 9/22 on December 22. Pt remains with deficits in attention and insight. Pt left in recliner with MD present for rounds. Cont ST POC.   Pain Pain Assessment Pain Scale: 0-10 Pain Score: 0-No pain  Therapy/Group: Individual Therapy  Dewaine Conger 02/12/2021, 10:31 AM

## 2021-02-12 NOTE — Progress Notes (Signed)
PROGRESS NOTE   Subjective/Complaints: Fluid intake improving despite thickened liquids   ROS: Patient denies CP, SOB, N/V/D   Objective:   No results found. No results for input(s): WBC, HGB, HCT, PLT in the last 72 hours.  No results for input(s): NA, K, CL, CO2, GLUCOSE, BUN, CREATININE, CALCIUM in the last 72 hours.   Intake/Output Summary (Last 24 hours) at 02/12/2021 1022 Last data filed at 02/12/2021 0956 Gross per 24 hour  Intake 980 ml  Output --  Net 980 ml         Physical Exam: Vital Signs Blood pressure 131/82, pulse 61, temperature 98.2 F (36.8 C), temperature source Oral, resp. rate 14, height 5\' 8"  (1.727 m), weight 58.4 kg, SpO2 95 %.  General: No acute distress Mood and affect are appropriate Heart: Regular rate and rhythm no rubs murmurs or extra sounds Lungs: Clear to auscultation, breathing unlabored, no rales or wheezes Abdomen: Positive bowel sounds, soft nontender to palpation, nondistended Extremities: No clubbing, cyanosis, or edema Skin: No evidence of breakdown, no evidence of rash   Skin: facial lesion Neuro:  more alert. Still dysarthric. Voice a little wet. Right gaze preference but can be cued to left. Left central 7 and tongue deviation. LUE 1+ pec, 2+ biceps, trace hand. LLE 4/5 prox to distal. Decreased LT left arm>leg.  Musculoskeletal: no focal pain, normal rom    Assessment/Plan: 1. Functional deficits which require 3+ hours per day of interdisciplinary therapy in a comprehensive inpatient rehab setting. Physiatrist is providing close team supervision and 24 hour management of active medical problems listed below. Physiatrist and rehab team continue to assess barriers to discharge/monitor patient progress toward functional and medical goals  Care Tool:  Bathing    Body parts bathed by patient: Left arm, Chest, Abdomen, Front perineal area, Right upper leg, Left upper  leg, Face, Right arm, Buttocks, Left lower leg, Right lower leg   Body parts bathed by helper: Right arm, Buttocks, Right lower leg, Left lower leg     Bathing assist Assist Level: Minimal Assistance - Patient > 75%     Upper Body Dressing/Undressing Upper body dressing   What is the patient wearing?: Pull over shirt    Upper body assist Assist Level: Moderate Assistance - Patient 50 - 74%    Lower Body Dressing/Undressing Lower body dressing      What is the patient wearing?: Incontinence brief, Pants     Lower body assist Assist for lower body dressing: Moderate Assistance - Patient 50 - 74%     Toileting Toileting Toileting Activity did not occur Landscape architect and hygiene only): N/A (no void or bm)  Toileting assist Assist for toileting: Minimal Assistance - Patient > 75%     Transfers Chair/bed transfer  Transfers assist     Chair/bed transfer assist level: Contact Guard/Touching assist     Locomotion Ambulation   Ambulation assist      Assist level: Minimal Assistance - Patient > 75% Assistive device: No Device Max distance: 50   Walk 10 feet activity   Assist     Assist level: Minimal Assistance - Patient > 75% Assistive device: No Device  Walk 50 feet activity   Assist Walk 50 feet with 2 turns activity did not occur: Safety/medical concerns  Assist level: Minimal Assistance - Patient > 75% Assistive device: No Device    Walk 150 feet activity   Assist Walk 150 feet activity did not occur: Safety/medical concerns  Assist level: Minimal Assistance - Patient > 75% Assistive device: No Device    Walk 10 feet on uneven surface  activity   Assist Walk 10 feet on uneven surfaces activity did not occur: Safety/medical concerns         Wheelchair     Assist Is the patient using a wheelchair?: Yes Type of Wheelchair: Manual    Wheelchair assist level: Minimal Assistance - Patient > 75% Max wheelchair distance: 52  ft    Wheelchair 50 feet with 2 turns activity    Assist        Assist Level: Minimal Assistance - Patient > 75%   Wheelchair 150 feet activity     Assist      Assist Level: Total Assistance - Patient < 25%   Blood pressure 131/82, pulse 61, temperature 98.2 F (36.8 C), temperature source Oral, resp. rate 14, height 5\' 8"  (1.727 m), weight 58.4 kg, SpO2 95 %.  Medical Problem List and Plan: 1.  Left-sided weakness with dysarthria functional deficits secondary to right lacunar basal ganglia infarct             -patient may shower             -ELOS/Goals: 12-14 days, goals min assist with PT/OT/SLP  Continue CIR therapies including PT, OT, and SLP   2.  Antithrombotics: -DVT/anticoagulation:  Pharmaceutical: Lovenox             -antiplatelet therapy: Aspirin 81 mg daily and Plavix 75 mg daily x3 weeks and aspirin alone 3. Pain Management: Tylenol as needed 4. Mood/sleep-wake:  less alert  -added amantadine for arousal 100mg  bid--seems to have helped             -antipsychotic agents: N/A 5. Neuropsych: This patient is capable of making decisions on his own behalf. 6. Skin/Wound Care: Routine skin checks 7. Fluids/Electrolytes/Nutrition:   -labs ok  8.  Dysphagia.  pt with apparent chronic cough, tolerated purees and nectars with SLP---> now on D1/nectar diet  -continue amantadine for arousal  -SLP following  If symptoms persist after stroke dysphagia recovery would rec f/u with PCP, possible GI referral as OP  9.  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  continue Vimpat 10.  History of alcohol tobacco polysubstance use.  Urine drug screen positive cocaine as well as marijuana.  Provide counseling as appropriate 11.  Hyperlipidemia.  continue Lipitor 12. Facial lesion: outpatient referral to dermatology upon d/c    LOS: 6 days A FACE TO FACE EVALUATION WAS PERFORMED  Charlett Blake 02/12/2021, 10:22 AM

## 2021-02-12 NOTE — Progress Notes (Signed)
Occupational Therapy Session Note  Patient Details  Name: Douglas Spencer MRN: [id number] Date of Birth: 10/19/1945  Today's Date: 02/12/2021 OT Individual Time: 1330-1430 OT Individual Time Calculation (min): 60 min    Short Term Goals: Week 1:  OT Short Term Goal 1 (Week 1): Patient will don UB clothing with Min A and good carryover of hemi technique. OT Short Term Goal 2 (Week 1): Patient will complete 2/3 parts of toileting task with Min A. OT Short Term Goal 3 (Week 1): Patient will locate 3 ADL items on L side of sink surface with supervision A and good carryover of compensatory visual techniques. OT Short Term Goal 4 (Week 1): Patient will complete toilet transfer with Min A and LRAD.  Skilled Therapeutic Interventions/Progress Updates:    Pt received sitting in the recliner with no c/o pain. He completed sit > stand with min A. Ambulatory transfer to the bathroom with min A. He required mod A for toileting tasks overall. Poor L attention, requiring mod cues to locate items in L visual field. Continent urine void. He completed oral care at the sink seated with cueing for L attention and set up assist. He completed 100 ft of functional mobility to the therapy gym with min A, no AD. He transitioned into quadroped on the mat for LUE forced weightbearing and core stabilization. He required min A overall for midline orientation and for core stability. He was able to keep his LUE in extension with only CGA- min A with fatigue. Prone push ups with min-mod facilitation for the LUE to keep up with the RUE. Rest breaks provided throughout session. Pt transitioned to EOM with min A. From here he worked on functional reaching and grasp/release with his LUE to min-mod facilitation and mod cueing for L attention. He returned to his room and was left sitting up in the recliner with all needs met. Chair alarm set.   Therapy Documentation Precautions:  Precautions Precautions: Fall Precaution Comments: L  inattention Restrictions Weight Bearing Restrictions: No   Therapy/Group: Individual Therapy  Curtis Sites 02/12/2021, 7:18 AM

## 2021-02-12 NOTE — Progress Notes (Signed)
Occupational Therapy Session Note  Patient Details  Name: Douglas Spencer MRN: [id number] Date of Birth: [birth year]  Today's Date: 02/12/2021 OT Individual Time: 8295-6213 and 1100-1135 OT Individual Time Calculation (min): 35 min and 35 min   Short Term Goals: Week 1:  OT Short Term Goal 1 (Week 1): Patient will don UB clothing with Min A and good carryover of hemi technique. OT Short Term Goal 2 (Week 1): Patient will complete 2/3 parts of toileting task with Min A. OT Short Term Goal 3 (Week 1): Patient will locate 3 ADL items on L side of sink surface with supervision A and good carryover of compensatory visual techniques. OT Short Term Goal 4 (Week 1): Patient will complete toilet transfer with Min A and LRAD.  Skilled Therapeutic Interventions/Progress Updates:    Visit 1: Pain: no c/o pain Pt eager for a shower today. Received in bed and able to sit to EOB with S. With CGA he ambulated to sit on toilet with small urine void.  Transferred to shower. Able to actively lift L arm to wash underneath arm pit. Hand over hand guidance with L hand to wash arm with good activity of biceps and triceps. Pt able to grasp bottles of self care items and open fingers 40% of the way.   Min A with shower to wash R arm thoroughly and guard pt in standing as he washed his bottom and sitting as he crossed his L leg to wash foot.  Pt then dried off and ambulated to recliner to dress.  Min cues for hemidressing strategies - CGA for balance with donning pants. Able to cross legs to don socks.   Focused on symmetrical sit to stands with pt focusing on "keeping his nose between his feet" as he tends to compensate with trunk rotation. Due to short session time, had NT come in to finish getting pt set up with alarm belt, call light, etc.   Visit 2:  Pain: no c/o pain Pt received in recliner, he had liquid food dripping out of L side of mouth. Gave him a washcloth to wipe his mouth, but it kept dripping so  suggested we walk to sink to thoroughly rinse out his mouth.   Had pt doff non slip socks with S and don socks  with min A, he was able to slip on shoes.  A to tie shoes.  Had pt practice standing up using "nose between knees" technique to avoid leaning toward right.  He was able to rise to stand with min A and then needed cue to engage his "glutes" to stand all the way up.  Pt felt this cue was helpful. Pt amb to sink with min A.  Standing at sink needed CONSTANT cues to engage LLE and wt shift to R as he leans to left.  Responds well to each cue and self corrects but had difficulty maintaining balance.  At sink he had to rinse and spit several times to get all food out of his mouth.    Had pt continue to work on static standing as I did PROM to L scapula. He has limited scapular AROM due to tight musculature.  Moved to sit EOB and work on AROM of sliding L hand from knee to thigh 20x.  Then worked on reaching forward to grasp lotion bottle and then place bottle on bed with light assist to support arm. Pt actively using finger flexors and extensors. Repeated 12x with having pt rotate torso to reach  for bottle on his R and rotate to place bottle on the L.  The more repetitions he did, the smoother and faster he arm movements became becoming more natural.  Pt stated he was really pleased with what he could do today. Pt ambulated back to recliner to rest.  Belt alarm on and all needs met.   Therapy Documentation Precautions:  Precautions Precautions: Fall Precaution Comments: L inattention Restrictions Weight Bearing Restrictions: No    Pain Assessment Pain Scale: 0-10 Pain Score: 0-No pain ADL: ADL Eating: Moderate cueing, Minimal assistance Where Assessed-Eating: Bed level Grooming: Maximal assistance Where Assessed-Grooming: Edge of bed Upper Body Bathing: Minimal assistance Where Assessed-Upper Body Bathing: Shower Lower Body Bathing: Minimal assistance Where Assessed-Lower Body Bathing:  Shower Upper Body Dressing: Minimal cueing Where Assessed-Upper Body Dressing: Chair Lower Body Dressing: Minimal cueing, Minimal assistance Where Assessed-Lower Body Dressing: Chair Toileting: Minimal cueing, Minimal assistance Where Assessed-Toileting: Glass blower/designer: Therapist, music Method: Counselling psychologist: Energy manager: Not assessed Social research officer, government: Curator Method: Heritage manager: Grab bars, Transfer tub bench ADL Comments: Cues for hemi technique and hand over hand assist to utilize LUE at gross assist level  Therapy/Group: Individual Therapy  Dana 02/12/2021, 10:37 AM

## 2021-02-13 DIAGNOSIS — I639 Cerebral infarction, unspecified: Secondary | ICD-10-CM | POA: Diagnosis not present

## 2021-02-13 MED ORDER — CLOPIDOGREL BISULFATE 75 MG PO TABS
75.0000 mg | ORAL_TABLET | Freq: Every day | ORAL | Status: DC
  Administered 2021-02-14 – 2021-02-21 (×8): 75 mg via ORAL
  Filled 2021-02-13 (×8): qty 1

## 2021-02-13 MED ORDER — HYDROXYZINE HCL 25 MG PO TABS
25.0000 mg | ORAL_TABLET | Freq: Four times a day (QID) | ORAL | Status: DC | PRN
  Administered 2021-02-13 – 2021-02-21 (×10): 25 mg via ORAL
  Filled 2021-02-13 (×11): qty 1

## 2021-02-13 MED ORDER — SENNOSIDES-DOCUSATE SODIUM 8.6-50 MG PO TABS
1.0000 | ORAL_TABLET | Freq: Every evening | ORAL | Status: DC | PRN
  Administered 2021-02-15: 1 via ORAL

## 2021-02-13 MED ORDER — ATORVASTATIN CALCIUM 40 MG PO TABS
40.0000 mg | ORAL_TABLET | Freq: Every day | ORAL | Status: DC
  Administered 2021-02-14 – 2021-02-21 (×8): 40 mg via ORAL
  Filled 2021-02-13 (×8): qty 1

## 2021-02-13 MED ORDER — ASPIRIN 81 MG PO CHEW
81.0000 mg | CHEWABLE_TABLET | Freq: Every day | ORAL | Status: DC
  Administered 2021-02-14 – 2021-02-21 (×8): 81 mg via ORAL
  Filled 2021-02-13 (×8): qty 1

## 2021-02-13 MED ORDER — LACOSAMIDE 50 MG PO TABS
50.0000 mg | ORAL_TABLET | Freq: Two times a day (BID) | ORAL | Status: DC
  Administered 2021-02-13 – 2021-02-21 (×16): 50 mg via ORAL
  Filled 2021-02-13 (×17): qty 1

## 2021-02-13 MED ORDER — AMANTADINE HCL 50 MG/5ML PO SOLN
100.0000 mg | Freq: Two times a day (BID) | ORAL | Status: DC
  Administered 2021-02-13 – 2021-02-20 (×14): 100 mg via ORAL
  Filled 2021-02-13 (×17): qty 10

## 2021-02-13 MED ORDER — PANTOPRAZOLE 2 MG/ML SUSPENSION
40.0000 mg | Freq: Every day | ORAL | Status: DC
  Administered 2021-02-14 – 2021-02-21 (×8): 40 mg via ORAL
  Filled 2021-02-13 (×6): qty 20

## 2021-02-13 NOTE — Progress Notes (Signed)
Physical Therapy Session Note  Patient Details  Name: Douglas Spencer MRN: [id number] Date of Birth: 1946/01/10  Today's Date: 02/13/2021 PT Individual Time: 1301-1358 PT Individual Time Calculation (min): 57 min   Short Term Goals: Week 1:  PT Short Term Goal 1 (Week 1): Patient will perborm basic transfers with CGA using LRAD. PT Short Term Goal 2 (Week 1): Patient will ambulate >100 feet with CGA using LRAD. PT Short Term Goal 3 (Week 1): Patient will ascend/descend 6 steps with L rail with min A-CGA simulating home set-up. PT Short Term Goal 4 (Week 1): Patient will improve Berg Balance Scale score by MDC (7 points)   Skilled Therapeutic Interventions/Progress Updates:  Patient seated upright in w/c on entrance to room. Just completing lunch with friend. Patient alert and agreeable to PT session. Pt transferred to gym in w/c Max A fo time.   Patient with no pain complaint throughout session.  Therapeutic Activity: Transfers: Patient performed sit<>stand and stand pivot transfers throughout session with no AD and supervision/ CGA. Provided verbal cues for improving balance and technique.  Gait Training:  Patient ambulated throughout hallways to/ from therapy gym with no AD and main focus on L attention. Demonstrated very close proximity to objects/ obstacles in hallway to L side but does not run into any objects. Does not give therapist room to remain to pt's L side. Provided vc/ tc when nearing objects to L side.  Neuromuscular Re-ed: NMR facilitated during session with focus on standing balance, LLE strengthening/ coordination, motor planning, weight shifting. Pt guided in lateral stepping with HHA prn covering 15' in each direction. Progressed to forward gait with toe touches to cones placed laterally using LLE only. Then RLE only. After seated rest, added large obstacles to walk around, stepping over low height obstacles, then up/down four 6" steps using L side HR to ascend. Pt is  able to clear all with no AD and HHA prn. NMR performed for improvements in motor control and coordination, balance, sequencing, judgement, and self confidence/ efficacy in performing all aspects of mobility at highest level of independence.   Patient seated  in recliner at end of session with brakes locked, belt alarm set, and all needs within reach.    Therapy Documentation Precautions:  Precautions Precautions: Fall Precaution Comments: L inattention Restrictions Weight Bearing Restrictions: No General:   Pain:  No pain complaint this session.  Therapy/Group: Individual Therapy  Alger Simons PT, DPT 02/13/2021, 4:31 PM

## 2021-02-13 NOTE — Progress Notes (Signed)
Physical Therapy Session Note  Patient Details  Name: Douglas Spencer MRN: [id number] Date of Birth: [birth year]  Today's Date: 02/13/2021 PT Individual Time: 1110-1205 PT Individual Time Calculation (min): 55 min   Short Term Goals: Week 1:  PT Short Term Goal 1 (Week 1): Patient will perborm basic transfers with CGA using LRAD. PT Short Term Goal 2 (Week 1): Patient will ambulate >100 feet with CGA using LRAD. PT Short Term Goal 3 (Week 1): Patient will ascend/descend 6 steps with L rail with min A-CGA simulating home set-up. PT Short Term Goal 4 (Week 1): Patient will improve Berg Balance Scale score by MDC (7 points)  Skilled Therapeutic Interventions/Progress Updates:    Pt received sitting in w/c with a friend visiting and pt agreeable to therapy session. Sit>stands, no AD, with CGA progressing towards supervision for steadying throughout session. Gait training ~157ft to main therapy gym, no AD, with CGA/light min assist for balance - slight excessive L LE adduction and poor foot clearance with minor L lean that worsens with fatigue. Without a rest break, performed stair navigation ascending/descending 12 steps using B HRs - requires max cuing to place L hand on handrail - cuing for reciprocal stepping pattern for L LE NMR, no knee instability noted - improved L hand attention with repetition - light min assist for balance.  Dynamic gait training with L UE NMR in hallway of finding numbered disks in order and picking them up with L hand (disks placed to allow easier ability to get digits on either side to pinch the disk) - gradually decreasing L foot clearance and narrow BOS with worsening L lean during gait causing minor L anterior LOB - min assist for balance and cuing for increased width of BOS (keeps L foot too close to center).  Pt reports sudden urge to have BM.  Donned 4lb weight on L LE and gait training ~165ft back to room working on L attention and navigation with 3 L turns -  continues to require light min assist for balance - cuing for increased L foot clearance and step length as pt slides foot forward on ground. Cuing to use L hand to open doors for NMR.  Standing with CGA/light min assist for balance able to manage LB clothing with min assist for L side. Unable to void BM despite attempts. Gait out of bathroom with light min assist. Standing close supervision during hand hygiene at sink.  Pt excitedly reports he feels he has improving movement in L UE today. At end of session, pt left seated in  w/c with needs in reach and seat belt alarm on.  Therapy Documentation Precautions:  Precautions Precautions: Fall Precaution Comments: L inattention Restrictions Weight Bearing Restrictions: No   Pain: Denies pain during session.   Therapy/Group: Individual Therapy  Tawana Scale , PT, DPT, NCS, CSRS  02/13/2021, 11:28 AM

## 2021-02-13 NOTE — Progress Notes (Signed)
Speech Language Pathology Daily Session Note  Patient Details  Name: Douglas Spencer MRN: [id number] Date of Birth: 1945-03-31  Today's Date: 02/13/2021 SLP Individual Time: 6381-7711 SLP Individual Time Calculation (min): 24 min  Short Term Goals: Week 1: SLP Short Term Goal 1 (Week 1): Pt will tolerate therapeutic trials of ice chips, NTL and puree solids with SLP only to determine readiness for MBS and/or diet upgrade provided mod A cues for swallow strategies SLP Short Term Goal 2 (Week 1): Pt will increase awareness into changes s/p CVA by identifying 3 cognitive and 3 physical impairments with mod A SLP Short Term Goal 3 (Week 1): Pt will increase recall of functional information within and between treatment sessions with mod A multimodal cues SLP Short Term Goal 4 (Week 1): Pt will increase awareness of and compensate for L anterior loss of secretions provided mod A verbal and visual cues SLP Short Term Goal 5 (Week 1): Patient will demonstrate functional problem solving for basic and familiar tasks with mod verbal cues.  Skilled Therapeutic Interventions: Skilled ST treatment focused on swallowing goals. Pt seen upright in wheelchair. Pt presented with wet vocal quality and throat clearing prior to consumption of PO intake. Pt expressed "I've been coughing like this all my life." SLP acknowledged pt's report of chronic cough, however also educated on physiologic changes on swallow function s/p CVA per MBS obtained on 12/27. SLP facilitated skilled observation with nectar thick liquids via cup. Pt exhibited mild anterior loss on L, intermittent wet vocal quality, and intermittent throat clearing. During one instance patient attempted to speak immediately following NTL cup sip resulting in significant coughing. He then attempted to take additional sips while still coughing. After this resolved patient expressed he never stops eating or drinking when he's coughing because "it's too good to stop."  SLP reinforced safe swallowing precautions and strategies. Overall pt required min A cues for awareness of vocal quality and to implement swallow safety during trials. Pt verbalized understanding and expressed "i'll try my best." Patient was left in chair with alarm activated and immediate needs within reach at end of session. Continue per current plan of care.      Pain    Therapy/Group: Individual Therapy  Patty Sermons 02/13/2021, 10:28 AM

## 2021-02-13 NOTE — Progress Notes (Signed)
Patient ID: Douglas Spencer, male   DOB: Nov 28, 1945, 76 y.o.   MRN: [id number]  SW met with pt and pt friend in room to provide updates from team conference, and d/c date 1/11. Pt aware SW to follow-up with his friend [name].  *SW later provided pt with HHA list for HHA preference and SW will follow-up.   [place] spoke with pt friend [name] [phone]) to provide above updates. SW discussed family education. He will confirm with SW if Monday (1/9) is an option.   Loralee Pacas, MSW, [place] Office: [phone] Cell: [phone] Fax: [phone]

## 2021-02-13 NOTE — Progress Notes (Signed)
PROGRESS NOTE   Subjective/Complaints:  Pt wants to leave, however wants strength to get better as well.  LBM 12/31- feels a little constipated.  Spoke with nurse to get him Sorbitol prn today since no BM lately.    ROS:  Pt denies SOB, abd pain, CP, N/V/C/D, and vision changes  Objective:   No results found. No results for input(s): WBC, HGB, HCT, PLT in the last 72 hours.  No results for input(s): NA, K, CL, CO2, GLUCOSE, BUN, CREATININE, CALCIUM in the last 72 hours.   Intake/Output Summary (Last 24 hours) at 02/13/2021 1008 Last data filed at 02/12/2021 1700 Gross per 24 hour  Intake 300 ml  Output --  Net 300 ml        Physical Exam: Vital Signs Blood pressure (!) 143/85, pulse (!) 58, temperature 98 F (36.7 C), resp. rate 15, height 5\' 8"  (1.727 m), weight 56.5 kg, SpO2 96 %.    General: awake, alert, appropriate, NAD HENT: conjugate gaze; oropharynx moist- looks like chewing, but not eating CV: regular rate; no JVD Pulmonary: CTA B/L; no W/R/R- good air movement GI: soft, NT, ND, (+)BS; hyperactive Psychiatric: appropriate- flat Neurological: alert-  Skin: facial lesion Neuro:  more alert. Still dysarthric. Voice a little wet. Right gaze preference but can be cued to left. Left central 7 and tongue deviation. LUE 1+ pec, 2+ biceps, trace hand. LLE 4/5 prox to distal. Decreased LT left arm>leg.  Musculoskeletal: no focal pain, normal rom    Assessment/Plan: 1. Functional deficits which require 3+ hours per day of interdisciplinary therapy in a comprehensive inpatient rehab setting. Physiatrist is providing close team supervision and 24 hour management of active medical problems listed below. Physiatrist and rehab team continue to assess barriers to discharge/monitor patient progress toward functional and medical goals  Care Tool:  Bathing    Body parts bathed by patient: Left arm, Chest, Abdomen,  Front perineal area, Right upper leg, Left upper leg, Face, Right arm, Buttocks, Left lower leg, Right lower leg   Body parts bathed by helper: Right arm, Buttocks, Right lower leg, Left lower leg     Bathing assist Assist Level: Minimal Assistance - Patient > 75%     Upper Body Dressing/Undressing Upper body dressing   What is the patient wearing?: Pull over shirt    Upper body assist Assist Level: Minimal Assistance - Patient > 75%    Lower Body Dressing/Undressing Lower body dressing      What is the patient wearing?: Incontinence brief, Pants     Lower body assist Assist for lower body dressing: Minimal Assistance - Patient > 75%     Toileting Toileting Toileting Activity did not occur (Clothing management and hygiene only): N/A (no void or bm)  Toileting assist Assist for toileting: Minimal Assistance - Patient > 75%     Transfers Chair/bed transfer  Transfers assist     Chair/bed transfer assist level: Contact Guard/Touching assist     Locomotion Ambulation   Ambulation assist      Assist level: Minimal Assistance - Patient > 75% Assistive device: No Device Max distance: 50   Walk 10 feet activity   Assist  Assist level: Minimal Assistance - Patient > 75% Assistive device: No Device   Walk 50 feet activity   Assist Walk 50 feet with 2 turns activity did not occur: Safety/medical concerns  Assist level: Minimal Assistance - Patient > 75% Assistive device: No Device    Walk 150 feet activity   Assist Walk 150 feet activity did not occur: Safety/medical concerns  Assist level: Minimal Assistance - Patient > 75% Assistive device: No Device    Walk 10 feet on uneven surface  activity   Assist Walk 10 feet on uneven surfaces activity did not occur: Safety/medical concerns         Wheelchair     Assist Is the patient using a wheelchair?: Yes Type of Wheelchair: Manual    Wheelchair assist level: Minimal Assistance -  Patient > 75% Max wheelchair distance: 52 ft    Wheelchair 50 feet with 2 turns activity    Assist        Assist Level: Minimal Assistance - Patient > 75%   Wheelchair 150 feet activity     Assist      Assist Level: Total Assistance - Patient < 25%   Blood pressure (!) 143/85, pulse (!) 58, temperature 98 F (36.7 C), resp. rate 15, height 5\' 8"  (1.727 m), weight 56.5 kg, SpO2 96 %.  Medical Problem List and Plan: 1.  Left-sided weakness with dysarthria functional deficits secondary to right lacunar basal ganglia infarct             -patient may shower             -ELOS/Goals: 12-14 days, goals min assist with PT/OT/SLP  Team conference today- con't CIR  2.  Antithrombotics: -DVT/anticoagulation:  Pharmaceutical: Lovenox             -antiplatelet therapy: Aspirin 81 mg daily and Plavix 75 mg daily x3 weeks and aspirin alone 3. Pain Management: Tylenol as needed 4. Mood/sleep-wake:  less alert  -added amantadine for arousal 100mg  bid--seems to have helped             -antipsychotic agents: N/A 5. Neuropsych: This patient is capable of making decisions on his own behalf. 6. Skin/Wound Care: Routine skin checks 7. Fluids/Electrolytes/Nutrition:   -labs ok  8.  Dysphagia.  pt with apparent chronic cough, tolerated purees and nectars with SLP---> now on D1/nectar diet  -continue amantadine for arousal  -SLP following  If symptoms persist after stroke dysphagia recovery would rec f/u with PCP, possible GI referral as OP  9.  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  continue Vimpat 10.  History of alcohol tobacco polysubstance use.  Urine drug screen positive cocaine as well as marijuana.  Provide counseling as appropriate 11.  Hyperlipidemia.  continue Lipitor 12. Facial lesion: outpatient referral to dermatology upon d/c 13. Constipation  1/3- will give Sorbitol today- no BM since 12/31    LOS: 7 days A FACE TO FACE EVALUATION WAS PERFORMED  Douglas Spencer 02/13/2021,  10:08 AM

## 2021-02-13 NOTE — Patient Care Conference (Signed)
Inpatient RehabilitationTeam Conference and Plan of Care Update Date: 02/13/2021   Time: 10:12 AM    Patient Name: Douglas Spencer      Medical Record Number: [id number]  Date of Birth: [birth year] Sex: Male         Room/Bed: 4W21C/4W21C-01 Payor Info: Payor: [employer] MEDICARE / Plan: [employer] MEDICARE HMO/PPO / Product Type: *No Product type* /    Admit Date/Time:  02/06/2021  4:56 PM  Primary Diagnosis:  Infarction of right basal ganglia [facility])  Hospital Problems: Principal Problem:   Infarction of right basal ganglia [facility])    Expected Discharge Date: Expected Discharge Date: 02/21/21  Team Members Present: Physician leading conference: Dr. Leeroy Cha Social Worker Present: Loralee Pacas, [place] Nurse Present: Dorthula Nettles, RN PT Present: Tereasa Coop, PT OT Present: Laverle Hobby, OT SLP Present: Sherren Kerns, SLP PPS Coordinator present : Gunnar Fusi, SLP     Current Status/Progress Goal Weekly Team Focus  Bowel/Bladder   Patient is continent and incontinent of bladder and Bowel, LBM 1/123  Regain continence of bladder/bowel  Assess toileting needs QS/PRN,   Swallow/Nutrition/ Hydration   dys 1 diet, nectar thick liquids, min A  min A for swallow safety  tolerance of dys 1 textures, nectar thick liquids, safe swallowing precautions   ADL's   Min A UB ADLs, mod A LB and toileting tasks. Poor L attention. LUE with good active movement- 80 degrees of shoulder flexion  supervision overall  LUE NMR, L attention, functional mobility, ADLs   Mobility   Bed mobility = supervision to MinA for sit-->supine; Transfers = supervision/ CGA; ambulation = up to MinA especially with fatigue and melt to L side  IND for bed mobility; IND/SUP for transfers; SUP for ambulation; CGA for stairs  L hemibody NMR, increasing general strength and activity tolerance, improving standing balance, improving LOA with transfers, gait, stairs, improving quality of gait, improving sustained attention  during functional mobility   Communication             Safety/Cognition/ Behavioral Observations  min A - significantly improved score on SLUMS  sup-to-min A  intellectual/emergent awareness, attention   Pain   Denies pain  < 3  Assess QS/PRN   Skin               Discharge Planning:  D/c to home with support from his caregiver/friend [name] who will provide 24/7 care. Caregiver has reported limited physical abilities due to back injuries but willing to provide all of pt care needs. SW will continue to confirm there are no barrieres to discharge.   Team Discussion: NG tube discontinued, tolerating diet well. Will give Sorbitol today due to LBM 02/10/21. Will give Dermatology referral at discharge for skin lesion. Will also schedule follow-up GI appointment at discharge. Incontinent bladder, continent bowel. Currently min assist for upper body ADL's and mod assist lower body ADL's and toileting. Poor attention awareness. Supervision/min assist with bed mobility, contact guard transfers. Up to min assist with ambulation. Cognitively doing well. Supervision during meals, recommending MBS before discharge. States wet cough has always been there. Continuing education of use of suction.  Patient on target to meet rehab goals: yes, supervision goals with ADL's and mobility. Supervision/min assist with SLP.  *See Care Plan and progress notes for long and short-term goals.   Revisions to Treatment Plan:  Discontinued NG tube, Dys 1, nectar thick liquids.  Teaching Needs: Family education, medication management, bowel/bladder management, safety awareness, transfer/gait training, etc.  Current Barriers to  Discharge: Decreased caregiver support, Home enviroment access/layout, Incontinence, Wound care, Lack of/limited family support, and Medication compliance  Possible Resolutions to Barriers: Family education Time toileting schedule Follow-up PT, OT, SLP Follow-up Dermatology  Follow-up with  GI     Medical Summary               I attest that I was present, lead the team conference, and concur with the assessment and plan of the team.   Cristi Loron 02/13/2021, 1:46 PM

## 2021-02-13 NOTE — Progress Notes (Signed)
Occupational Therapy Session Note  Patient Details  Name: Douglas Spencer MRN: [id number] Date of Birth: April 25, 1945  Today's Date: 02/13/2021 OT Individual Time: 1164-3539 OT Individual Time Calculation (min): 60 min    Short Term Goals: Week 1:  OT Short Term Goal 1 (Week 1): Patient will don UB clothing with Min A and good carryover of hemi technique. OT Short Term Goal 2 (Week 1): Patient will complete 2/3 parts of toileting task with Min A. OT Short Term Goal 3 (Week 1): Patient will locate 3 ADL items on L side of sink surface with supervision A and good carryover of compensatory visual techniques. OT Short Term Goal 4 (Week 1): Patient will complete toilet transfer with Min A and LRAD.  Skilled Therapeutic Interventions/Progress Updates:    Pt received supine with no c/o pain. Pt completed bed mobility with CGA to the EOB. Ambulatory transfer to the bathroom with min A. He required mod cueing for L attention during transfer and bathroom tasks. Min A for toileting tasks overall. He completed peri care in standing with CGA. Pt incontinent of urine in brief and bed saturated as well- somewhat aware. Pt changed shirt with min A for L attention and to thread shirt over LUE. He donned pants with mod A. He completed 250 ft of functional mobility to the therapy gym with min A- CGA. He used the BITS for intensive blocked practice of the LUE- reaching for 4 in dots. 37% accuracy on size 10 and min-mod facilitation overall for shoulder flexion and horizontal adduction. 2 trials with similar accuracy and assist level. Pt returned to his room and was provided with a cup of nectar thickened coffee per his request. He was left sitting up with all needs met, chair alarm set.   Therapy Documentation Precautions:  Precautions Precautions: Fall Precaution Comments: L inattention Restrictions Weight Bearing Restrictions: No  Therapy/Group: Individual Therapy  Curtis Sites 02/13/2021, 6:28 AM

## 2021-02-14 DIAGNOSIS — I639 Cerebral infarction, unspecified: Secondary | ICD-10-CM | POA: Diagnosis not present

## 2021-02-14 NOTE — Progress Notes (Signed)
Patient ID: Douglas Spencer, male   DOB: 1945/06/21, 76 y.o.   MRN: [id number]  SW met with pt in room to discuss HHA preference. Pt still reviewing and will follow-up with [place], MSW, [place] Office: [phone] Cell: [phone] Fax: [phone]

## 2021-02-14 NOTE — Progress Notes (Signed)
PROGRESS NOTE   Subjective/Complaints: Pt does not recall having BM yesterday No abd pain DIscussed stroke location and symptoms   ROS:  Pt denies SOB, abd pain, CP, N/V/C/D, and vision changes  Objective:   No results found. No results for input(s): WBC, HGB, HCT, PLT in the last 72 hours.  No results for input(s): NA, K, CL, CO2, GLUCOSE, BUN, CREATININE, CALCIUM in the last 72 hours.   Intake/Output Summary (Last 24 hours) at 02/14/2021 0809 Last data filed at 02/13/2021 1700 Gross per 24 hour  Intake 240 ml  Output --  Net 240 ml         Physical Exam: Vital Signs Blood pressure (!) 144/86, pulse (!) 59, temperature 97.8 F (36.6 C), resp. rate 16, height 5\' 8"  (1.727 m), weight 60.6 kg, SpO2 97 %.   General: No acute distress Mood and affect are appropriate Heart: Regular rate and rhythm no rubs murmurs or extra sounds Lungs: Clear to auscultation, breathing unlabored, no rales or wheezes Abdomen: Positive bowel sounds, soft nontender to palpation, nondistended Extremities: No clubbing, cyanosis, or edema   Skin: facial lesion Neuro:  more alert. Still dysarthric. Voice a little wet. Right gaze preference but can be cued to left. Left central 7 and tongue deviation. LUE 1+ pec, 2+ biceps, trace hand. LLE 4/5 prox to distal. Decreased LT left arm>leg.  Musculoskeletal: no focal pain, normal rom    Assessment/Plan: 1. Functional deficits which require 3+ hours per day of interdisciplinary therapy in a comprehensive inpatient rehab setting. Physiatrist is providing close team supervision and 24 hour management of active medical problems listed below. Physiatrist and rehab team continue to assess barriers to discharge/monitor patient progress toward functional and medical goals  Care Tool:  Bathing    Body parts bathed by patient: Left arm, Chest, Abdomen, Front perineal area, Right upper leg, Left upper  leg, Face, Right arm, Buttocks, Left lower leg, Right lower leg   Body parts bathed by helper: Right arm, Buttocks, Right lower leg, Left lower leg     Bathing assist Assist Level: Minimal Assistance - Patient > 75%     Upper Body Dressing/Undressing Upper body dressing   What is the patient wearing?: Pull over shirt    Upper body assist Assist Level: Minimal Assistance - Patient > 75%    Lower Body Dressing/Undressing Lower body dressing      What is the patient wearing?: Incontinence brief, Pants     Lower body assist Assist for lower body dressing: Minimal Assistance - Patient > 75%     Toileting Toileting Toileting Activity did not occur (Clothing management and hygiene only): N/A (no void or bm)  Toileting assist Assist for toileting: Minimal Assistance - Patient > 75%     Transfers Chair/bed transfer  Transfers assist     Chair/bed transfer assist level: Contact Guard/Touching assist     Locomotion Ambulation   Ambulation assist      Assist level: Minimal Assistance - Patient > 75% Assistive device: No Device Max distance: 178ft   Walk 10 feet activity   Assist     Assist level: Minimal Assistance - Patient > 75% Assistive device: No  Device   Walk 50 feet activity   Assist Walk 50 feet with 2 turns activity did not occur: Safety/medical concerns  Assist level: Minimal Assistance - Patient > 75% Assistive device: No Device    Walk 150 feet activity   Assist Walk 150 feet activity did not occur: Safety/medical concerns  Assist level: Minimal Assistance - Patient > 75% Assistive device: No Device    Walk 10 feet on uneven surface  activity   Assist Walk 10 feet on uneven surfaces activity did not occur: Safety/medical concerns         Wheelchair     Assist Is the patient using a wheelchair?: Yes Type of Wheelchair: Manual    Wheelchair assist level: Minimal Assistance - Patient > 75% Max wheelchair distance: 52 ft     Wheelchair 50 feet with 2 turns activity    Assist        Assist Level: Minimal Assistance - Patient > 75%   Wheelchair 150 feet activity     Assist      Assist Level: Total Assistance - Patient < 25%   Blood pressure (!) 144/86, pulse (!) 59, temperature 97.8 F (36.6 C), resp. rate 16, height 5\' 8"  (1.727 m), weight 60.6 kg, SpO2 97 %.  Medical Problem List and Plan: 1.  Left-sided weakness with dysarthria functional deficits secondary to right lacunar basal ganglia infarct             -patient may shower             -ELOS/Goals: 12-14 days, goals min assist with PT/OT/SLP  Team conference today- con't CIR  2.  Antithrombotics: -DVT/anticoagulation:  Pharmaceutical: Lovenox             -antiplatelet therapy: Aspirin 81 mg daily and Plavix 75 mg daily x3 weeks and aspirin alone 3. Pain Management: Tylenol as needed 4. Mood/sleep-wake:  less alert  -added amantadine for arousal 100mg  bid--seems to have helped             -antipsychotic agents: N/A 5. Neuropsych: This patient is capable of making decisions on his own behalf. 6. Skin/Wound Care: Routine skin checks 7. Fluids/Electrolytes/Nutrition:   -labs ok  8.  Dysphagia.  pt with apparent chronic cough, tolerated purees and nectars with SLP---> now on D1/nectar diet  -continue amantadine for arousal  -SLP following  If symptoms persist after stroke dysphagia recovery would rec f/u with PCP, possible GI referral as OP  9.  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  continue Vimpat 10.  History of alcohol tobacco polysubstance use.  Urine drug screen positive cocaine as well as marijuana.  Provide counseling as appropriate 11.  Hyperlipidemia.  continue Lipitor 12. Facial lesion: outpatient referral to dermatology upon d/c 13. Constipation  1/3- Lg stool type 4, pt did not remember having this     LOS: 8 days A FACE TO [place] E Douglas Spencer 02/14/2021, 8:09 AM

## 2021-02-14 NOTE — Progress Notes (Signed)
Occupational Therapy Weekly Progress Note  Patient Details  Name: Douglas Spencer MRN: [id number] Date of Birth: 1945-09-06  Beginning of progress report period: 02/07/21 End of progress report period: 02/15/20  Today's Date: 02/14/2021 OT Individual Time: 0812-0900 OT Individual Time Calculation (min): 48 min   Session 2: Today's Date: 02/14/2021 OT Individual Time: 4098-1191 OT Individual Time Calculation (min): 38 min    Patient has met 3 of 4 short term goals. Balinda Quails has made excellent progress so far in OT. He has progressed with his ADL transfers and requires min A- CGA consistently. He is able to don a shirt with min A and pants with mod A. He still present with deficits in L attention but has progressed in his ability to follow cueing for compensatory strategies. Family edu with his friend [name] is being scheduled currently for next weeks d/c.   Patient continues to demonstrate the following deficits: muscle weakness, impaired timing and sequencing, unbalanced muscle activation, decreased coordination, and decreased motor planning, decreased attention to left, decreased awareness and delayed processing, and decreased standing balance, decreased postural control, hemiplegia, and decreased balance strategies and therefore will continue to benefit from skilled OT intervention to enhance overall performance with BADL and iADL.  Patient progressing toward long term goals..  Continue plan of care.  OT Short Term Goals Week 1:  OT Short Term Goal 1 (Week 1): Patient will don UB clothing with Min A and good carryover of hemi technique. OT Short Term Goal 1 - Progress (Week 1): Met OT Short Term Goal 2 (Week 1): Patient will complete 2/3 parts of toileting task with Min A. OT Short Term Goal 2 - Progress (Week 1): Met OT Short Term Goal 3 (Week 1): Patient will locate 3 ADL items on L side of sink surface with supervision A and good carryover of compensatory visual techniques. OT Short Term  Goal 3 - Progress (Week 1): Progressing toward goal OT Short Term Goal 4 (Week 1): Patient will complete toilet transfer with Min A and LRAD. OT Short Term Goal 4 - Progress (Week 1): Met Week 2:  OT Short Term Goal 1 (Week 2): STG= LTG d/t ELOS  Skilled Therapeutic Interventions/Progress Updates:    Pt received sitting in the w/c with no c/o pain, eating breakfast. He had one large coughing episode when he ate several bites at once. Cueing/education provided re SLP strategies to reduce choking risk. He completed oral care at the sink with set up assist. Pt completed ambulatory transfer from his room to the ADL apt with CGA, 200 ft. Session focused on LUE NMR- functional grasp, hand prehension, wrist stabilization, and shoulder flexion for functional reaching into cabinets, grasping real food items of different weights to also challenge force modulation. He required min facilitation and additional time to allow his LUE to activate. He required 2 seated rest breaks. Discussed CIMT techniques with pt and he consented to OT putting a mitt on his R hand to encourage forced use of the LUE. He returned to his room and was left sitting up with all needs met, awaiting PT.   Session 2: Pt received in the w/c with no c/o pain. Mitt still on, pt able to still use hand through mitt so a washcloth was placed in the mitt to further discourage dominant hand use. He completed 200 ft of functional mobility to the therapy gym with CGA. BITS used to have intensive blocked practice using the LUE for NMR. He completed functional reaching on moving target  and tracing activity with min-mod facilitation overall. Improved L attention overall in scanning BITS screen. He returned to his room and was left sitting up in the w.c with all needs met, chair alarm set. Encouraged pt to attempt and use the LUE to eat lunch.     Therapy Documentation Precautions:  Precautions Precautions: Fall Precaution Comments: L  inattention Restrictions Weight Bearing Restrictions: No  Therapy/Group: Individual Therapy  Curtis Sites 02/14/2021, 6:22 AM

## 2021-02-14 NOTE — Progress Notes (Signed)
Speech Language Pathology Weekly Progress and Session Note ° °Patient Details  °Name: Douglas Spencer °MRN: [id number] °Date of Birth: 04/03/1945 ° °Beginning of progress report period: February 06, 2022 °End of progress report period: February 14, 2021 ° °Today's Date: 02/14/2021 °SLP Individual Time: 1400-1500 °SLP Individual Time Calculation (min): 60 min ° °Short Term Goals: °Week 1: SLP Short Term Goal 1 (Week 1): Pt will tolerate therapeutic trials of ice chips, NTL and puree solids with SLP only to determine readiness for MBS and/or diet upgrade provided mod A cues for swallow strategies °SLP Short Term Goal 1 - Progress (Week 1): Met °SLP Short Term Goal 2 (Week 1): Pt will increase awareness into changes s/p CVA by identifying 3 cognitive and 3 physical impairments with mod A °SLP Short Term Goal 2 - Progress (Week 1): Met °SLP Short Term Goal 3 (Week 1): Pt will increase recall of functional information within and between treatment sessions with mod A multimodal cues °SLP Short Term Goal 3 - Progress (Week 1): Met °SLP Short Term Goal 4 (Week 1): Pt will increase awareness of and compensate for L anterior loss of secretions provided mod A verbal and visual cues °SLP Short Term Goal 4 - Progress (Week 1): Met °SLP Short Term Goal 5 (Week 1): Patient will demonstrate functional problem solving for basic and familiar tasks with mod verbal cues. °SLP Short Term Goal 5 - Progress (Week 1): Met ° °  °New Short Term Goals: °Week 2: SLP Short Term Goal 1 (Week 2): STGs=LTGs due to ELOS ° °Weekly Progress Updates: Patient has made functional gains and has met 5 of 5 STGs this reporting period. Currently, patient is consuming Dys. 1 textures with nectar-thick liquids with intermittent overt s/s of aspiration requiring Min-Mod A verbal cues for use of swallowing compensatory strategies and to monitor anterior spillage. Patient demonstrates improved speech intelligibility and is ~90% intelligible with Min verbal cues needed  to self-monitor and correct his wet vocal quality and utilize an increased vocal intensity. Patient's overall cognitive functioning has also improved but patient continues to demonstrate decreased awareness of deficits and recall of functional information. Patient and family education ongoing. Patient would benefit from continued skilled SLP intervention to maximize his swallowing and cognitive functioning as well as his speech intelligibility prior to discharge.  °  ° °Intensity: Minumum of 1-2 x/day, 30 to 90 minutes °Frequency: 3 to 5 out of 7 days °Duration/Length of Stay: 02/21/21 °Treatment/Interventions: Cognitive remediation/compensation;Cueing hierarchy;Dysphagia/aspiration precaution training;Functional tasks;Patient/family education;Therapeutic Activities;Therapeutic Exercise;Speech/Language facilitation;Internal/external aids;Environmental controls ° ° °Daily Session ° °Skilled Therapeutic Interventions:  Skilled treatment session focused on dysphagia and cognitive goals. SLP facilitated session by providing a snack of nectar-thick liquids and pureed textures. Patient initially consumed the snack with minimal overt s/s of aspiration but as the snack progressed, overt s/s of aspiration increased. Suspect due to increased rate of PO intake. Overall Mod verbal cues were needed for use of swallowing compensatory strategies, especially not talking with a full oral cavity. SLP also provided general education regarding aspiration and reflux precautions. Patient verbalized understanding but also acknowledged difficulty utilizing strategies. Recommend patient continue current diet. Patient continues to endorse significant swallowing deficits at baseline, patient's longtime friend of 25 years present and confirmed patient's reports. Patient left upright in wheelchair with alarm on and all needs within reach. Continue with current plan of care.    ° ° °Pain °No/Denies Pain  ° °Therapy/Group: Individual  Therapy ° °,  °02/14/2021, 3:36 PM ° ° ° ° ° ° °

## 2021-02-14 NOTE — Progress Notes (Signed)
Physical Therapy Weekly Progress Note  Patient Details  Name: Douglas Spencer MRN: [id number] Date of Birth: [birth year]  Beginning of progress report period: February 07, 2021 End of progress report period: February 14, 2021  Today's Date: 02/14/2021 PT Individual Time: 0900-1015 PT Individual Time Calculation (min): 75 min   Patient has met 3 of 3 short term goals.  Pt progressing toward long term goals. Pt ambulates with no AD CGA over 300 ft consistently. Supervision to CGA at times for transfers. Demoes improved balance as indicated by score of 31/56 on Berg balance scale.   Patient continues to demonstrate the following deficits muscle weakness, decreased coordination, and decreased standing balance and hemiplegia and therefore will continue to benefit from skilled PT intervention to increase functional independence with mobility.  Patient progressing toward long term goals..  Continue plan of care.  PT Short Term Goals Week 1:  PT Short Term Goal 1 (Week 1): Patient will perborm basic transfers with CGA using LRAD. PT Short Term Goal 1 - Progress (Week 1): Met PT Short Term Goal 2 (Week 1): Patient will ambulate >100 feet with CGA using LRAD. PT Short Term Goal 2 - Progress (Week 1): Met PT Short Term Goal 3 (Week 1): Patient will ascend/descend 6 steps with L rail with min A-CGA simulating home set-up. PT Short Term Goal 3 - Progress (Week 1): Met PT Short Term Goal 4 (Week 1): Patient will improve Berg Balance Scale score by MDC (7 points) PT Short Term Goal 4 - Progress (Week 1): Met Week 2:     Skilled Therapeutic Interventions/Progress Updates:  Pt seated in w/c on arrival and agreeable to therapy. Wearing CIMT mitt on RUE throughout session. No complaint of pain. Pt ambulated throughout session with CGA and no AD.  Sit to stand with supervision throughout, cues to turn all the way to chair when sitting throughout.   Gait training:  Pt ambulated with `CGA to/from gym  throughout session with CGA and no AD. High intensity gait training x 540 ft, and x 900 ft. Demoes listing to the L, occ bumping into objects on L side, poor foot clearance, decr stance on LLE,  which worsens with fatigue (>200 ft).  Stair training, 5 x 4 6"steps with CGA, LUE on hand rail. Pt required cues 50% of the time to move L hand on rail.    At this time, pt reported urge to void. Ambulated back to room and to bathroom. Supervision hygiene, min A for clothing management for time. Pt then directed in berg balance scale as documented below. Pt remained in w/c at end of session and was left with all needs in reach and alarm active.   Patient demonstrates increased fall risk as noted by score of   31/56 on Berg Balance Scale.  (<36= high risk for falls, close to 100%; 37-45 significant >80%; 46-51 moderate >50%; 52-55 lower >25%)      Therapy Documentation Precautions:  Precautions Precautions: Fall Precaution Comments: L inattention Restrictions Weight Bearing Restrictions: No  Balance: Berg Balance Test Sit to Stand: Able to stand without using hands and stabilize independently Standing Unsupported: Able to stand 2 minutes with supervision Sitting with Back Unsupported but Feet Supported on Floor or Stool: Able to sit safely and securely 2 minutes Stand to Sit: Controls descent by using hands Transfers: Able to transfer safely, definite need of hands Standing Unsupported with Eyes Closed: Able to stand 10 seconds with supervision Standing Ubsupported with Feet Together: Able to  place feet together independently but unable to hold for 30 seconds From Standing, Reach Forward with Outstretched Arm: Reaches forward but needs supervision From Standing Position, Pick up Object from Floor: Able to pick up shoe, needs supervision From Standing Position, Turn to Look Behind Over each Shoulder: Needs supervision when turning Turn 360 Degrees: Needs close supervision or verbal  cueing Standing Unsupported, Alternately Place Feet on Step/Stool: Able to complete >2 steps/needs minimal assist Standing Unsupported, One Foot in Front: Loses balance while stepping or standing Standing on One Leg: Able to lift leg independently and hold equal to or more than 3 seconds Total Score: 31   Therapy/Group: Individual Therapy  Mickel Fuchs 02/14/2021, 12:39 PM

## 2021-02-15 DIAGNOSIS — G40909 Epilepsy, unspecified, not intractable, without status epilepticus: Secondary | ICD-10-CM | POA: Diagnosis not present

## 2021-02-15 DIAGNOSIS — I639 Cerebral infarction, unspecified: Secondary | ICD-10-CM | POA: Diagnosis not present

## 2021-02-15 DIAGNOSIS — I69322 Dysarthria following cerebral infarction: Secondary | ICD-10-CM | POA: Diagnosis not present

## 2021-02-15 DIAGNOSIS — F1910 Other psychoactive substance abuse, uncomplicated: Secondary | ICD-10-CM | POA: Diagnosis not present

## 2021-02-15 NOTE — Progress Notes (Signed)
Physical Therapy Session Note  Patient Details  Name: Douglas Spencer MRN: [id number] Date of Birth: 05/06/1945  Today's Date: 02/15/2021 PT Individual Time: 3536-1443 and 1540-0867 PT Individual Time Calculation (min): 74 min and 47 min  Short Term Goals: Week 2:  PT Short Term Goal 1 (Week 2): = to LTGs based on ELOS  Skilled Therapeutic Interventions/Progress Updates:   Session 1: Pt received sitting in recliner and agreeable to therapy session. Sit>stand from recliner, no AD, cuing to push up with L UE from armrest for NMR - requires min assist to maintain anterior weight shift and come to rise without posterior LOB. Gait training ~115ft to main therapy gym, no UE support, with CGA/light min assist for balance due to intermittent L lean LOB - continues to have quick L swing phase advancement with poor R weight shift and decreased L LE foot clearance during swing - with cuing pt able to improve but not able to sustain when distracted or for long distances.  Dynamic gait training including the following using the agility ladder:  - forward reciprocal stepping with pt requiring light min assist due to L anterior lean/LOB - side stepping with min progressed to CGA  as pt had improved midline orientation then progressed to side stepping while holding a large ball in both hands for dual-task, abe to do with CGA - more difficulty stepping towards L due to L LOB when bringing R foot over towards the L  - backwards step-to leading with L LE one direction then R LE back - has worse balance leading with R LE as pt not able to weight shift back onto R LE to allow him to bring L foot back (due to L lean towards involved side) - requires min assist for balance throughout  Dynamic standing balance task of 1/2 circle cone tapping with light min assist for balance due to L lean/LOB - pt has significant difficulty keeping R weight shift onto R LE in order to allow L LE to tap cone - multimodal cuing to maintain  upright posture and to improve weight shift.  Stair navigation training ascending/descending 4 (6" height) steps using L UE support on each HR only - requires light mod assist for balance due to strong L lean when pt not utilizing R UE support especially on the descent - step-to pattern to maintain pt safety while trying with less UE support.  Dynamic standing balance using [employer] starting with "% weight bearing" to educate pt on poor midline posture with L lean/weight shift progressed to "limites of stability" with pt having significant difficulty motor planning how to weight shift to move dot towards the target despite multimodal cuing for initiating movement at the hips. Discontinued this task to avoid pt frustration.  Dynamic gait training, L UE NMR dual-task with focus on L attention by having pt ambulate to pick up cones dispersed throughout therapy gym - light min assist for balance as pt often doesn't step L LE under his COM prior to starting to reach for the cone causing him to have worse L lean/LOB and poor ability to alternate attention to stop reaching and regain his balance requiring verbal cuing.   Gait training back to room as described above. Pt left seated in recliner with needs in reach and seat belt alarm on.  Session 2: Pt received sitting in recliner and agreeable to therapy session. Sit>stand from recliner, no AD, with light min assist for rising to stand due to poor anterior lean/weight shift -  cuing to push up with L hand from armrest for NMR. Gait training >1,020ft to outside, no AD, including on/off elevators - had pt use L UE to manage pushing elevator buttons - varying CGA to light min assist for balance. Dynamic gait training outside >1,371ft, no AD, with only 1x seated break throughout - including brick pathways, unlevel ramps, 14 stairs using R UE support on each HR and then 8 additional long depth stairs using R UE support on each HR all with CGA/light min assist and  intermittent mod assist primarily when pt turning towards R, going downhill on the ramp, or when the ramp was slopped worsening the pt's L lean. Pt continues to demo L lean bias with quick L swing phase with poor foot clearance and short R stance phase without full R weight shift. Pt able to navigate the stairs with reciprocal pattern in both directions with min assist; however, does have a hard time placing L foot correctly on the step during descent causing his heel to catch the stair requiring him to readjust. Had pt navigate back up to the CIR floor with therapist telling him the destination and pt having to utilize hospital signs with max question cuing - pt continues to demonstrate L inattention with therapist providing extensive education throughout session on importance of turning head and visually attending to the therapist and other stimulus on that side. Dynamic standing balance task on kinteron against 25cm/sec resistance targeting midline orientation and learning to weight shift towards R by pushing that petal down to the floor- with fatigue requires heavy min assist for balance due to progressively worsening L lean. Gait training back to room as described above and pt left seated in w/c with needs in reach, seat belt alarm on, and meal tray set-up with nurse aware.   Therapy Documentation Precautions:  Precautions Precautions: Fall Precaution Comments: L inattention Restrictions Weight Bearing Restrictions: No   Pain: Session 1: Denies pain during session.  Session 2: Denies pain during session.    Therapy/Group: Individual Therapy  Tawana Scale , PT, DPT, NCS, CSRS  02/15/2021, 12:55 PM

## 2021-02-15 NOTE — Progress Notes (Signed)
PROGRESS NOTE   Subjective/Complaints: Pt up in bed. Sipping on nectar juice. Remembered that I was on vacation and asked me how my time off was  ROS: Patient denies fever, rash, sore throat, blurred vision, nausea, vomiting, diarrhea, cough, shortness of breath or chest pain, joint or back pain, headache, or mood change.   Objective:   No results found. No results for input(s): WBC, HGB, HCT, PLT in the last 72 hours.  No results for input(s): NA, K, CL, CO2, GLUCOSE, BUN, CREATININE, CALCIUM in the last 72 hours.   Intake/Output Summary (Last 24 hours) at 02/15/2021 1027 Last data filed at 02/15/2021 0700 Gross per 24 hour  Intake 474 ml  Output 250 ml  Net 224 ml        Physical Exam: Vital Signs Blood pressure 134/77, pulse 66, temperature 98.5 F (36.9 C), resp. rate 16, height 5\' 8"  (1.727 m), weight 58.6 kg, SpO2 96 %.   Constitutional: No distress . Vital signs reviewed. HEENT: NCAT, EOMI, oral membranes moist Neck: supple Cardiovascular: RRR without murmur. No JVD    Respiratory/Chest: CTA Bilaterally without wheezes or rales. Normal effort    GI/Abdomen: BS +, non-tender, non-distended Ext: no clubbing, cyanosis, or edema Psych: pleasant and cooperative  Skin: facial lesion Neuro:  very alert. Improved insight. Phonation much improved, speech clearer. Right gaze preference but can be cued to left. Left central 7 and tongue deviation. LUE 1+ pec, 2+ biceps, trace hand. LLE 4/5 prox to distal. Decreased LT left arm>leg.  Musculoskeletal: no focal pain, normal rom    Assessment/Plan: 1. Functional deficits which require 3+ hours per day of interdisciplinary therapy in a comprehensive inpatient rehab setting. Physiatrist is providing close team supervision and 24 hour management of active medical problems listed below. Physiatrist and rehab team continue to assess barriers to discharge/monitor patient progress  toward functional and medical goals  Care Tool:  Bathing    Body parts bathed by patient: Left arm, Chest, Abdomen, Front perineal area, Right upper leg, Left upper leg, Face, Right arm, Buttocks, Left lower leg, Right lower leg   Body parts bathed by helper: Right arm, Buttocks, Right lower leg, Left lower leg     Bathing assist Assist Level: Minimal Assistance - Patient > 75%     Upper Body Dressing/Undressing Upper body dressing   What is the patient wearing?: Pull over shirt    Upper body assist Assist Level: Minimal Assistance - Patient > 75%    Lower Body Dressing/Undressing Lower body dressing      What is the patient wearing?: Incontinence brief, Pants     Lower body assist Assist for lower body dressing: Minimal Assistance - Patient > 75%     Toileting Toileting Toileting Activity did not occur (Clothing management and hygiene only): N/A (no void or bm)  Toileting assist Assist for toileting: Minimal Assistance - Patient > 75%     Transfers Chair/bed transfer  Transfers assist     Chair/bed transfer assist level: Contact Guard/Touching assist     Locomotion Ambulation   Ambulation assist      Assist level: Minimal Assistance - Patient > 75% Assistive device: No Device Max  distance: 176ft   Walk 10 feet activity   Assist     Assist level: Minimal Assistance - Patient > 75% Assistive device: No Device   Walk 50 feet activity   Assist Walk 50 feet with 2 turns activity did not occur: Safety/medical concerns  Assist level: Minimal Assistance - Patient > 75% Assistive device: No Device    Walk 150 feet activity   Assist Walk 150 feet activity did not occur: Safety/medical concerns  Assist level: Minimal Assistance - Patient > 75% Assistive device: No Device    Walk 10 feet on uneven surface  activity   Assist Walk 10 feet on uneven surfaces activity did not occur: Safety/medical concerns          Wheelchair     Assist Is the patient using a wheelchair?: Yes Type of Wheelchair: Manual    Wheelchair assist level: Minimal Assistance - Patient > 75% Max wheelchair distance: 52 ft    Wheelchair 50 feet with 2 turns activity    Assist        Assist Level: Minimal Assistance - Patient > 75%   Wheelchair 150 feet activity     Assist      Assist Level: Total Assistance - Patient < 25%   Blood pressure 134/77, pulse 66, temperature 98.5 F (36.9 C), resp. rate 16, height 5\' 8"  (1.727 m), weight 58.6 kg, SpO2 96 %.  Medical Problem List and Plan: 1.  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  Antithrombotics: -DVT/anticoagulation:  Pharmaceutical: Lovenox             -antiplatelet therapy: Aspirin 81 mg daily and Plavix 75 mg daily x3 weeks and aspirin alone 3. Pain Management: Tylenol as needed 4. Mood/sleep-wake:  less alert  -added amantadine for arousal 100mg  bid which has helped             -antipsychotic agents: N/A 5. Neuropsych: This patient is capable of making decisions on his own behalf. 6. Skin/Wound Care: Routine skin checks 7. Fluids/Electrolytes/Nutrition:   -labs have been reviewed  8.  Dysphagia.  pt with apparent chronic cough, tolerated purees and nectars with SLP---> now on D1/nectar diet  -continue amantadine for arousal  -SLP following. He's doing better with comp strategies. Less cough!  ?possible GI referral as OP  9.  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  continue Vimpat 10.  History of alcohol tobacco polysubstance use.  Urine drug screen positive cocaine as well as marijuana.  Provide counseling as appropriate 11.  Hyperlipidemia.  continue Lipitor 12. Facial lesion: outpatient referral to dermatology upon d/c 13.  Constipation  Moving bowels     LOS: 9 days A FACE TO FACE EVALUATION WAS PERFORMED  Meredith Staggers 02/15/2021, 10:27 AM

## 2021-02-15 NOTE — Progress Notes (Signed)
Speech Language Pathology Daily Session Note  Patient Details  Name: Douglas Spencer MRN: [id number] Date of Birth: 10/08/1945  Today's Date: 02/15/2021 SLP Individual Time: 0700-0745 SLP Individual Time Calculation (min): 45 min  Short Term Goals: Week 2: SLP Short Term Goal 1 (Week 2): STGs=LTGs due to ELOS  Skilled Therapeutic Interventions: Skilled treatment session focused on cognitive and dysphagia goals. Patient independently recalled and utilized he swallowing compensatory strategies he was educated on during the previous SLP session throughout his breakfast meal of Dys. 1 textures with nectar-thick liquids. SLP utilized the video of his most recent MBS to reinforce his pharyngeal residue and the importance of utilizing multiple swallows. Patient verbalized understanding. Patient with significantly reduced coughing this session with his meal, suspect due to increased use of swallowing compensatory strategies. Patient acknowledged improvement and importance of using strategies to continue to maximize safety with meals. Recommend patient continue current diet. Patient left upright in bed with alarm on and all needs within reach. Continue with current plan of care.      Pain No/Denies Pain   Therapy/Group: Individual Therapy  Asim Gersten 02/15/2021, 7:48 AM

## 2021-02-15 NOTE — Progress Notes (Deleted)
Patient became anxious and agitated during therapy this AM. Patient packed his bags and stool at locked door waiting to leave. Several staff members attempted to redirect patient. Patient continued to yell and curse. Writer called girlfriend Vicente Males who was in the lobby waiting to come up. Security called for safety. Staff escorted Vicente Males from [employer] and was able to redirect patient into enclosure bed. Writer contacted [place] PA regarding patients behavior. No new orders at this time.

## 2021-02-15 NOTE — Progress Notes (Signed)
Occupational Therapy Session Note  Patient Details  Name: Douglas Spencer MRN: [id number] Date of Birth: 1945-11-29  Today's Date: 02/15/2021 OT Individual Time: 1245-8099 OT Individual Time Calculation (min): 60 min    Short Term Goals: Week 2:  OT Short Term Goal 1 (Week 2): STG= LTG d/t ELOS  Skilled Therapeutic Interventions/Progress Updates:    Pt received in room ready for therapy.  He did not need to shower today but he did want to work on oral care. Pt ambulated to sink with CGA.  Standing at sink pt was actively using L hand as an active assist with holding toothbrush while R hand applied toothpaste.  Then he removed dentures and held in L hand to brush with R hand. He did need A to cut and replace his seabond to dentures.  During standing, pt needed constant tactile cue of tapping his L quad to engage his leg as he would "sink" to his L side frequently.  Pt taken via wc to dayroom gym for LUE NMR.   Pt sat on mat and worked on AROM of sh with ball rolls forward and back and then sliding a bean bag on the table at an angle. He sits with L lean onto L hip, so had him work on large dynamic reach overhead to R for dynamic R side trunk elongation and L side contraction. He then had to pick up bag using L hand from R hand and place on table to his L for multiple reps.  For isometric L finger extension strength had pt hold large foam block between hands and then actively flex and extend elbows.  For sit to stand work. Pt held large foam board in both hands and was cued to keep it centered over thighs. He then had to rise to stand and sit while keeping his center of gravity to avoid leaning to the left. He did extremely well with this activity 10 x WITH NO ASSIST.  Videoed him doing again 10x on his phone so he could see how symmetrical his alignment was.   Excellent participation. Pt returned to room with all needs met.  Belt alarm set as pt resting in recliner.   Therapy  Documentation Precautions:  Precautions Precautions: Fall Precaution Comments: L inattention Restrictions Weight Bearing Restrictions: No   Pain: no c/o pain   ADL: ADL Eating: Moderate cueing, Minimal assistance Where Assessed-Eating: Bed level Grooming: Maximal assistance Where Assessed-Grooming: Edge of bed Upper Body Bathing: Minimal assistance Where Assessed-Upper Body Bathing: Shower Lower Body Bathing: Minimal assistance Where Assessed-Lower Body Bathing: Shower Upper Body Dressing: Minimal cueing Where Assessed-Upper Body Dressing: Chair Lower Body Dressing: Minimal cueing, Minimal assistance Where Assessed-Lower Body Dressing: Chair Toileting: Minimal cueing, Minimal assistance Where Assessed-Toileting: Glass blower/designer: Therapist, music Method: Counselling psychologist: Energy manager: Not assessed Social research officer, government: Curator Method: Heritage manager: Grab bars, Transfer tub bench ADL Comments: Cues for hemi technique and hand over hand assist to utilize LUE at gross assist level      Therapy/Group: Individual Therapy  Rayson Rando 02/15/2021, 8:28 AM

## 2021-02-16 DIAGNOSIS — G40909 Epilepsy, unspecified, not intractable, without status epilepticus: Secondary | ICD-10-CM | POA: Diagnosis not present

## 2021-02-16 DIAGNOSIS — I639 Cerebral infarction, unspecified: Secondary | ICD-10-CM | POA: Diagnosis not present

## 2021-02-16 DIAGNOSIS — I69322 Dysarthria following cerebral infarction: Secondary | ICD-10-CM | POA: Diagnosis not present

## 2021-02-16 DIAGNOSIS — F1910 Other psychoactive substance abuse, uncomplicated: Secondary | ICD-10-CM | POA: Diagnosis not present

## 2021-02-16 NOTE — Progress Notes (Signed)
Speech Language Pathology Daily Session Note  Patient Details  Name: Douglas Spencer MRN: [id number] Date of Birth: Jul 07, 1945  Today's Date: 02/16/2021 SLP Individual Time: 1405-1505 SLP Individual Time Calculation (min): 60 min  Short Term Goals: Week 2: SLP Short Term Goal 1 (Week 2): STGs=LTGs due to ELOS  Skilled Therapeutic Interventions: Skilled treatment session focused on dysphagia and cognitive goals. SLP facilitated session by providing supervision level verbal cues for recall of his swallowing compensatory strategies. Patient consumed nectar-thick liquids via cup with intermittent verbal cues needed for use of multiple swallows with only 1 cough noted while drinking 8 oz. Patient consumed trials of Dys. 2 textures and demonstrated efficient mastication with complete oral clearance without overt s/s of aspiration. Recommend trial tray prior to upgrade. SLP also educated on pharyngeal strengthening exercises. Patient performed exercises with Min verbal cues. Patient handed off to PT. Continue with current plan of care.      Pain No/Denies Pain   Therapy/Group: Individual Therapy  Liany Mumpower 02/16/2021, 3:37 PM

## 2021-02-16 NOTE — Progress Notes (Signed)
Physical Therapy Session Note  Patient Details  Name: Douglas Spencer MRN: [id number] Date of Birth: Nov 27, 1945  Today's Date: 02/16/2021 PT Individual Time: [id number] and 4782-9562 PT Individual Time Calculation (min): 30 min and 62 min  Short Term Goals: Week 1:  PT Short Term Goals - Week 2 PT Short Term Goal 1 (Week 2): = to LTGs based on ELOS  Skilled Therapeutic Interventions/Progress Updates:  Session 1: Patient seated in w/c completing oral care with dentures along with OT on entrance to room. Patient alert and agreeable to PT session.   Patient with no pain complaint throughout session.  Therapeutic Activity: Transfers: Patient performed sit<>stand and stand pivot transfers throughout session with CGA/ MinA and demos impulsivity in transfer performance. Continues to use BLE extensor push into seated surface for rise to stand.  Provided verbal cues for improved technique.  Gait Training:  Patient ambulated >200 ft from room to main therapy gym and back using no AD with CGA. GT/ NMR in disc finding activity in hallway numbered 1-10 in order to promote scanning of visual field. Demonstrated good ability to look to both sides of hallway with turn of body rather than turn of head. Provided vc throughout for continued turn of head and eyes rather than turn of body. Only one cue needed for reminder of next number in sequence.  Neuromuscular Re-ed: NMR facilitated during session with focus on standing balance and scanning of visual field. Pt demos improved clearance of obstacles in hallway and improved space to his L side for therapist. NMR performed for improvements in motor control and coordination, balance, sequencing, judgement, and self confidence/ efficacy in performing all aspects of mobility at highest level of independence.   Room reorganized so that recliner is on opposite side of bed with forced use of L turn of head to see TV and look out of window.   Patient seated  in  recliner at end of session with brakes locked, seat pad alarm set, and all needs within reach.  Session 2: Patient seated in recliner on entrance to room. Patient alert and agreeable to PT session. Agreeable to PT-student present for observation.   Patient with no pain complaint throughout session.  Therapeutic Activity: Transfers: Patient performed sit<>stand and stand pivot transfers throughout session with CGA. One instance of MinA required for posterior LOB when pt rising too quickly with continued use of BLE pushing into seated surface to assist with rise to stand. Pt also continues to stand prior to staff/ therapy being ready to supervise. Provided vc for taking time and conscious focus in order to improve balance and decrease lean to L.  Gait Training:  Patient ambulated 200' x1/ 130' x1 with no AD and CGA. Demonstrated continued decrease of time on RLE but improving awareness to L side. Provided vc/ tc for increased time to R side and increased lean to R side. Introduced [facility] in order to promote lean to R side. Pt requires education on proper advancement of SPC but is able to pick this up quickly. Continued training under NMR activities.   Neuromuscular Re-ed: NMR facilitated during session with focus on dynamic standing balance/ dynamic gait, and increasing lean to R side/ decreasing lean to L side. Pt guided in toe touches to cones to pt's L side with LLE in order to increase cues to promote lean to R side.  VC and tc increased until finally asked pt to physically lean the R side of his body into L side of therapist's body  in order to promote increased lean. Minimal touching from pt. He relates that brain is telling him that he is going to fall if he leans to the R. Increased lean facilitated by gently increasing pressure into further lean. With increase of lean, pt is able to spend time on RLE, find balance, take time to pinpoint L toe to target and bring foot back to ground with minimal sway  or potential LOB. NMR performed for improvements in motor control and coordination, balance, sequencing, judgement, and self confidence/ efficacy in performing all aspects of mobility at highest level of independence.    Patient seated upright in recliner at end of session with brakes locked, belt alarm set, and all needs within reach.    Therapy Documentation Precautions:  Precautions Precautions: Fall Precaution Comments: L inattention Restrictions Weight Bearing Restrictions: No General:   Pain: Pain Assessment Pain Scale: 0-10 Pain Score: 0-No pain  Therapy/Group: Individual Therapy  Alger Simons PT, DPT 02/16/2021, 8:29 AM

## 2021-02-16 NOTE — Progress Notes (Signed)
Occupational Therapy Session Note  Patient Details  Name: Douglas Spencer MRN: [id number] Date of Birth: [birth year]  Today's Date: 02/16/2021 OT Individual Time: 2620-3559 OT Individual Time Calculation (min): 55 min    Short Term Goals: Week 2:  OT Short Term Goal 1 (Week 2): STG= LTG d/t ELOS  Skilled Therapeutic Interventions/Progress Updates:    Pt received supine finishing breakfast with no c/o pain. Bed mobility with increased time/effort and min cueing with CGA. He stood with min A to correct posterior bias, then once he self corrected trunk stability he was able to complete ambulatory transfer to the bathroom with CGA. He completed toileting tasks with CGA. Transfer into walk in shower with CGA. During shower, great spontaneous use of the LUE in bathing, crossing midline and reaching distally. He required min guard when standing to complete peri hygiene. [name] also requires cueing for safety/thoroughness in descending to sit at times, prematurely descending when still too far from the seat. He used the TTB with occasional cueing to sit when closing eyes to wash hair, etc. He returned to the w/c to dress.  Cueing for the LUE being a stabilizer when manipulating self care items, lotion, soap, toothpaste. Pt able to manipulate these containers with only CGA. Shirt donned with (S), pants with min A and cueing for LUE positioning. Pt was assisted in denture care and then handed off to PT in room.   Therapy Documentation Precautions:  Precautions Precautions: Fall Precaution Comments: L inattention Restrictions Weight Bearing Restrictions: No   Therapy/Group: Individual Therapy  Curtis Sites 02/16/2021, 6:11 AM

## 2021-02-16 NOTE — Progress Notes (Signed)
Nutrition Follow-up  DOCUMENTATION CODES:   Severe malnutrition in context of social or environmental circumstances  INTERVENTION:  Continue Ensure Enlive po TID (thickened to appropriate consistency), each supplement provides 350 kcal and 20 grams of protein.   Encourage adequate PO intake.   NUTRITION DIAGNOSIS:   Severe Malnutrition related to social / environmental circumstances (polysubstance abuse) as evidenced by severe muscle depletion, severe fat depletion  GOAL:   Patient will meet greater than or equal to 90% of their needs  Progressing-addressing needs through dysphagia 1 diet and thickened nutrition supplements  MONITOR:   PO intake, Supplement acceptance, Diet advancement, Labs, Weight trends  REASON FOR ASSESSMENT:   Consult Enteral/tube feeding initiation and management  ASSESSMENT:   76 year old male who presented to the ED on 12/20 as a Code Stroke. Pt received TNKase in the ED. PMH of EtOH abuse (stopped drinking in January 2022), polysubstance abuse, smoking, seizures. 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 He states that he is drinking 1-3 thickened Ensure daily. Noted 90-100% meal completions. Pt reports that he is having trouble with his dentures as they continue to fall out and he is scheduled to follow up with his dentist to be refit soon.  Admit weight: 59.6 kg (131.4 lbs)  Current weight: 58.9 kg (129.9 lbs)  Labs and medications reviewed  NUTRITION - FOCUSED PHYSICAL EXAM:  Flowsheet Row Most Recent Value  Orbital Region Severe depletion  Upper Arm Region Severe depletion  Thoracic and Lumbar Region Severe depletion  Buccal Region Moderate depletion  Temple Region Severe depletion  Clavicle Bone Region Severe depletion  Clavicle and Acromion Bone Region Severe depletion  Scapular Bone Region Severe depletion  Dorsal Hand  Moderate depletion  Patellar Region Severe depletion  Anterior Thigh Region Severe depletion  Posterior Calf Region Severe depletion  Edema (RD Assessment) None  Hair Reviewed  Eyes Reviewed  Mouth Reviewed  Skin Reviewed  Nails Reviewed       Diet Order:   Diet Order             DIET - DYS 1 Room service appropriate? Yes; Fluid consistency: Nectar Thick  Diet effective now                   EDUCATION NEEDS:   No education needs have been identified at this time  Skin:  Skin Assessment: Reviewed RN Assessment  Last BM:  02/16/20  Height:   Ht Readings from Last 1 Encounters:  02/06/21 5\' 8"  (1.727 m)    Weight:   Wt Readings from Last 1 Encounters:  02/16/21 58.9 kg   BMI:  Body mass index is 19.74 kg/m.  Estimated Nutritional Needs:   Kcal:  1900-2100  Protein:  90-105 grams  Fluid:  >1.9 L  Clayborne Dana, RDN, LDN Clinical Nutrition

## 2021-02-16 NOTE — Plan of Care (Signed)
°  Problem: Consults Goal: RH STROKE PATIENT EDUCATION Description: See Patient Education module for education specifics  Outcome: Progressing Goal: Nutrition Consult-if indicated Outcome: Progressing   Problem: RH BOWEL ELIMINATION Goal: RH STG MANAGE BOWEL WITH ASSISTANCE Description: STG Manage Bowel with [place]. Outcome: Progressing Goal: RH STG MANAGE BOWEL W/MEDICATION W/ASSISTANCE Description: STG Manage Bowel with Medication with [place]. Outcome: Progressing   Problem: RH SKIN INTEGRITY Goal: RH STG MAINTAIN SKIN INTEGRITY WITH ASSISTANCE Description: STG Maintain Skin Integrity With [place]. Outcome: Progressing   Problem: RH SAFETY Goal: RH STG ADHERE TO SAFETY PRECAUTIONS W/ASSISTANCE/DEVICE Description: STG Adhere to Safety Precautions With Cues and Reminders. Outcome: Progressing Goal: RH STG DECREASED RISK OF FALL WITH ASSISTANCE Description: STG Decreased Risk of Fall With [employer]. Outcome: Progressing   Problem: RH KNOWLEDGE DEFICIT Goal: RH STG INCREASE KNOWLEGDE OF HYPERLIPIDEMIA Description: Patient will demonstrate knowledge of medications used for HLD, dietary recommendations, and important lab values with educational materials and handouts provided by staff independently at discharge. Outcome: Progressing Goal: RH STG INCREASE KNOWLEDGE OF STROKE PROPHYLAXIS Description: Patient will demonstrate knowledge of medications used to prevent future strokes with educational materials and handouts provided by staff independently at discharge. Outcome: Progressing   Problem: RH Vision Goal: RH LTG Vision (Specify) Outcome: Progressing

## 2021-02-16 NOTE — Progress Notes (Signed)
PROGRESS NOTE   Subjective/Complaints: Pt slept real well. Up early with OT in shower. No pain complaints today. Still occasional cough. Doing well with diet since he uses comp strategies.   ROS: Patient denies fever, rash, sore throat, blurred vision, nausea, vomiting, diarrhea,   shortness of breath or chest pain, joint or back pain, headache, or mood change.   Objective:   No results found. No results for input(s): WBC, HGB, HCT, PLT in the last 72 hours.  No results for input(s): NA, K, CL, CO2, GLUCOSE, BUN, CREATININE, CALCIUM in the last 72 hours.   Intake/Output Summary (Last 24 hours) at 02/16/2021 0904 Last data filed at 02/16/2021 0706 Gross per 24 hour  Intake 220 ml  Output 675 ml  Net -455 ml        Physical Exam: Vital Signs Blood pressure 140/76, pulse 63, temperature 98.1 F (36.7 C), resp. rate 17, height 5\' 8"  (1.727 m), weight 58.9 kg, SpO2 96 %.   Constitutional: No distress . Vital signs reviewed. HEENT: NCAT, EOMI, oral membranes moist Neck: supple Cardiovascular: RRR without murmur. No JVD    Respiratory/Chest: Chest clear Bilaterally without wheezes or rales. Normal effort    GI/Abdomen: BS +, non-tender, non-distended Ext: no clubbing, cyanosis, or edema Psych: pleasant and cooperative  Skin: facial lesion present (chronic) Neuro:  very alert. Improved insight. Phonation/speech are improved. Right gaze preference but can be cued to left. Left central 7 and tongue deviation. LUE 1+ pec, 2+ biceps, trace hand. LLE 4/5 prox to distal. Decreased LT left arm>leg.  Musculoskeletal: no focal pain, normal rom    Assessment/Plan: 1. Functional deficits which require 3+ hours per day of interdisciplinary therapy in a comprehensive inpatient rehab setting. Physiatrist is providing close team supervision and 24 hour management of active medical problems listed below. Physiatrist and rehab team continue  to assess barriers to discharge/monitor patient progress toward functional and medical goals  Care Tool:  Bathing    Body parts bathed by patient: Left arm, Chest, Abdomen, Front perineal area, Right upper leg, Left upper leg, Face, Right arm, Buttocks, Left lower leg, Right lower leg   Body parts bathed by helper: Right arm, Buttocks, Right lower leg, Left lower leg     Bathing assist Assist Level: Contact Guard/Touching assist     Upper Body Dressing/Undressing Upper body dressing   What is the patient wearing?: Pull over shirt    Upper body assist Assist Level: Supervision/Verbal cueing    Lower Body Dressing/Undressing Lower body dressing      What is the patient wearing?: Incontinence brief, Pants     Lower body assist Assist for lower body dressing: Minimal Assistance - Patient > 75%     Toileting Toileting Toileting Activity did not occur (Clothing management and hygiene only): N/A (no void or bm)  Toileting assist Assist for toileting: Contact Guard/Touching assist     Transfers Chair/bed transfer  Transfers assist     Chair/bed transfer assist level: Contact Guard/Touching assist     Locomotion Ambulation   Ambulation assist      Assist level: Minimal Assistance - Patient > 75% Assistive device: No Device Max distance: >1,098ft  Walk 10 feet activity   Assist     Assist level: Minimal Assistance - Patient > 75% Assistive device: No Device   Walk 50 feet activity   Assist Walk 50 feet with 2 turns activity did not occur: Safety/medical concerns  Assist level: Minimal Assistance - Patient > 75% Assistive device: No Device    Walk 150 feet activity   Assist Walk 150 feet activity did not occur: Safety/medical concerns  Assist level: Minimal Assistance - Patient > 75% Assistive device: No Device    Walk 10 feet on uneven surface  activity   Assist Walk 10 feet on uneven surfaces activity did not occur: Safety/medical  concerns   Assist level: Moderate Assistance - Patient - 50 - 74% Assistive device: Other (comment) (none)   Wheelchair     Assist Is the patient using a wheelchair?: Yes Type of Wheelchair: Manual    Wheelchair assist level: Minimal Assistance - Patient > 75% Max wheelchair distance: 52 ft    Wheelchair 50 feet with 2 turns activity    Assist        Assist Level: Minimal Assistance - Patient > 75%   Wheelchair 150 feet activity     Assist      Assist Level: Total Assistance - Patient < 25%   Blood pressure 140/76, pulse 63, temperature 98.1 F (36.7 C), resp. rate 17, height 5\' 8"  (1.727 m), weight 58.9 kg, SpO2 96 %.  Medical Problem List and Plan: 1.  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  Antithrombotics: -DVT/anticoagulation:  Pharmaceutical: Lovenox             -antiplatelet therapy: Aspirin 81 mg daily and Plavix 75 mg daily x3 weeks and aspirin alone 3. Pain Management: Tylenol as needed 4. Mood/sleep-wake:  less alert  -continue amantadine for arousal 100mg  bid which has helped             -antipsychotic agents: N/A 5. Neuropsych: This patient is capable of making decisions on his own behalf. 6. Skin/Wound Care: Routine skin checks 7. Fluids/Electrolytes/Nutrition:   - po intake is excellent  -recheck labs monday 8.  Dysphagia.  pt with apparent chronic cough, tolerated purees and nectars with SLP---> now on D1/nectar diet  -continue amantadine for arousal  -SLP following. He's doing better with comp strategies. Less cough!  ?possible GI referral as OP depending upon his progress here 9.  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  continue Vimpat 10.  History of alcohol tobacco polysubstance use.  Urine drug screen positive cocaine as  well as marijuana.  Provide counseling as appropriate 11.  Hyperlipidemia.  continue Lipitor 12. Facial lesion: outpatient referral to dermatology upon d/c 13. Constipation  Moving bowels    Greater than 35 total minutes was spent in examination of patient, assessment of pertinent data,  formulation of a treatment plan, and in discussion with patient and/or family.     LOS: 10 days A FACE TO FACE EVALUATION WAS PERFORMED  Meredith Staggers 02/16/2021, 9:04 AM

## 2021-02-16 NOTE — Progress Notes (Signed)
Patient ID: Douglas Spencer, male   DOB: 10-Jul-1945, 76 y.o.   MRN: [id number]  SW submitted CAP/DA referral with [facility]. Expect packet within 10 days.   Loralee Pacas, MSW, [place] Office: [phone] Cell: [phone] Fax: [phone]

## 2021-02-17 DIAGNOSIS — I639 Cerebral infarction, unspecified: Secondary | ICD-10-CM | POA: Diagnosis not present

## 2021-02-17 NOTE — Progress Notes (Signed)
PROGRESS NOTE   Subjective/Complaints:  Pt reports doing pretty well- LBM yesterday x2; hates nectar thick liquids, but is drinking them well.    ROS:  Pt denies SOB, abd pain, CP, N/V/C/D, and vision changes  Objective:   No results found. No results for input(s): WBC, HGB, HCT, PLT in the last 72 hours.  No results for input(s): NA, K, CL, CO2, GLUCOSE, BUN, CREATININE, CALCIUM in the last 72 hours.   Intake/Output Summary (Last 24 hours) at 02/17/2021 1416 Last data filed at 02/17/2021 1300 Gross per 24 hour  Intake 718 ml  Output --  Net 718 ml        Physical Exam: Vital Signs Blood pressure 135/76, pulse (!) 56, temperature 97.7 F (36.5 C), temperature source Oral, resp. rate 16, height 5\' 8"  (1.727 m), weight 61 kg, SpO2 97 %.    General: awake, alert, appropriate, sitting up in bedside chair- great Eyeglasses;  NAD HENT: conjugate gaze; oropharynx moist CV: regular rate; no JVD Pulmonary: CTA B/L; no W/R/R- good air movement GI: soft, NT, ND, (+)BS Psychiatric: appropriate Neurological: alert Ext: no clubbing, cyanosis, or edema Psych: pleasant and cooperative  Skin: facial lesion present (chronic) Neuro:  very alert. Improved insight. Phonation/speech are improved. Right gaze preference but can be cued to left. Left central 7 and tongue deviation. LUE 1+ pec, 2+ biceps, trace hand. LLE 4/5 prox to distal. Decreased LT left arm>leg.  Musculoskeletal: no focal pain, normal rom    Assessment/Plan: 1. Functional deficits which require 3+ hours per day of interdisciplinary therapy in a comprehensive inpatient rehab setting. Physiatrist is providing close team supervision and 24 hour management of active medical problems listed below. Physiatrist and rehab team continue to assess barriers to discharge/monitor patient progress toward functional and medical goals  Care Tool:  Bathing    Body parts bathed  by patient: Left arm, Chest, Abdomen, Front perineal area, Right upper leg, Left upper leg, Face, Right arm, Buttocks, Left lower leg, Right lower leg   Body parts bathed by helper: Right arm, Buttocks, Right lower leg, Left lower leg     Bathing assist Assist Level: Contact Guard/Touching assist     Upper Body Dressing/Undressing Upper body dressing   What is the patient wearing?: Pull over shirt    Upper body assist Assist Level: Supervision/Verbal cueing    Lower Body Dressing/Undressing Lower body dressing      What is the patient wearing?: Incontinence brief, Pants     Lower body assist Assist for lower body dressing: Minimal Assistance - Patient > 75%     Toileting Toileting Toileting Activity did not occur (Clothing management and hygiene only): N/A (no void or bm)  Toileting assist Assist for toileting: Contact Guard/Touching assist     Transfers Chair/bed transfer  Transfers assist     Chair/bed transfer assist level: Contact Guard/Touching assist     Locomotion Ambulation   Ambulation assist      Assist level: Minimal Assistance - Patient > 75% Assistive device: No Device Max distance: >1,020ft   Walk 10 feet activity   Assist     Assist level: Minimal Assistance - Patient > 75% Assistive device:  No Device   Walk 50 feet activity   Assist Walk 50 feet with 2 turns activity did not occur: Safety/medical concerns  Assist level: Minimal Assistance - Patient > 75% Assistive device: No Device    Walk 150 feet activity   Assist Walk 150 feet activity did not occur: Safety/medical concerns  Assist level: Minimal Assistance - Patient > 75% Assistive device: No Device    Walk 10 feet on uneven surface  activity   Assist Walk 10 feet on uneven surfaces activity did not occur: Safety/medical concerns   Assist level: Moderate Assistance - Patient - 50 - 74% Assistive device: Other (comment) (none)   Wheelchair     Assist Is  the patient using a wheelchair?: Yes Type of Wheelchair: Manual    Wheelchair assist level: Minimal Assistance - Patient > 75% Max wheelchair distance: 52 ft    Wheelchair 50 feet with 2 turns activity    Assist        Assist Level: Minimal Assistance - Patient > 75%   Wheelchair 150 feet activity     Assist      Assist Level: Total Assistance - Patient < 25%   Blood pressure 135/76, pulse (!) 56, temperature 97.7 F (36.5 C), temperature source Oral, resp. rate 16, height 5\' 8"  (1.727 m), weight 61 kg, SpO2 97 %.  Medical Problem List and Plan: 1.  Left-sided weakness with dysarthria functional deficits secondary to right lacunar basal ganglia infarct             -patient may shower             -ELOS/Goals: 1/11, goals min assist with PT/OT/SLP  -Continue CIR- PT, OT and SLP 2.  Antithrombotics: -DVT/anticoagulation:  Pharmaceutical: Lovenox             -antiplatelet therapy: Aspirin 81 mg daily and Plavix 75 mg daily x3 weeks and aspirin alone 3. Pain Management: Tylenol as needed 4. Mood/sleep-wake:  less alert  -continue amantadine for arousal 100mg  bid which has helped             -antipsychotic agents: N/A 5. Neuropsych: This patient is capable of making decisions on his own behalf. 6. Skin/Wound Care: Routine skin checks 7. Fluids/Electrolytes/Nutrition:   - po intake is excellent  -recheck labs monday 8.  Dysphagia.  pt with apparent chronic cough, tolerated purees and nectars with SLP---> now on D1/nectar diet  -continue amantadine for arousal  -SLP following. He's doing better with comp strategies. Less cough!  ?possible GI referral as OP depending upon his progress here  1/7- tolerating nectar thick liquids, but is hopeful will improve soon.  9.  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  continue Vimpat 10.  History of alcohol tobacco polysubstance use.  Urine drug screen positive cocaine as well as marijuana.  Provide counseling as appropriate 11.   Hyperlipidemia.  continue Lipitor 12. Facial lesion: outpatient referral to dermatology upon d/c 13. Constipation  Moving bowels    LOS: 11 days A FACE TO FACE EVALUATION WAS PERFORMED  Marisela Line 02/17/2021, 2:16 PM

## 2021-02-17 NOTE — Plan of Care (Signed)
°  Problem: Consults Goal: RH STROKE PATIENT EDUCATION Description: See Patient Education module for education specifics  Outcome: Progressing Goal: Nutrition Consult-if indicated Outcome: Progressing   Problem: RH BOWEL ELIMINATION Goal: RH STG MANAGE BOWEL WITH ASSISTANCE Description: STG Manage Bowel with [place]. Outcome: Progressing Goal: RH STG MANAGE BOWEL W/MEDICATION W/ASSISTANCE Description: STG Manage Bowel with Medication with [place]. Outcome: Progressing   Problem: RH SKIN INTEGRITY Goal: RH STG MAINTAIN SKIN INTEGRITY WITH ASSISTANCE Description: STG Maintain Skin Integrity With [place]. Outcome: Progressing   Problem: RH SAFETY Goal: RH STG ADHERE TO SAFETY PRECAUTIONS W/ASSISTANCE/DEVICE Description: STG Adhere to Safety Precautions With Cues and Reminders. Outcome: Progressing Goal: RH STG DECREASED RISK OF FALL WITH ASSISTANCE Description: STG Decreased Risk of Fall With [employer]. Outcome: Progressing   Problem: RH KNOWLEDGE DEFICIT Goal: RH STG INCREASE KNOWLEGDE OF HYPERLIPIDEMIA Description: Patient will demonstrate knowledge of medications used for HLD, dietary recommendations, and important lab values with educational materials and handouts provided by staff independently at discharge. Outcome: Progressing Goal: RH STG INCREASE KNOWLEDGE OF STROKE PROPHYLAXIS Description: Patient will demonstrate knowledge of medications used to prevent future strokes with educational materials and handouts provided by staff independently at discharge. Outcome: Progressing   Problem: RH Vision Goal: RH LTG Vision (Specify) Outcome: Progressing

## 2021-02-17 NOTE — Progress Notes (Signed)
Occupational Therapy Session Note  Patient Details  Name: Douglas Spencer MRN: [id number] Date of Birth: 1945/02/26  Today's Date: 02/18/2021 OT Individual Time: 1400-1456 OT Individual Time Calculation (min): 56 min   Skilled Therapeutic Interventions/Progress Updates:    Pt greeted in the recliner with no c/o pain, requesting to shower today. CGA for ambulatory transfer without AD to the TTB. Pt able to doff clothing with 1 cue to initiate. He did need a cue to sit more safely on the TTB (scooting towards his Lt side) due to inattention. He then bathed at sit<stand level with CGA-Min balance assist overall in standing. He had a few Lt lateral LOBs which pt attributed to feeling "drowsy" today. Able to wash feet using figure 4 position bilaterally without LOBs. Note that in the shower pt able to reach for the soap bottle a few times using the Lt hand and also used his affected hand to lather hair with shampoo and wash his Rt underarm. He dressed EOB at sit<stand level without AD, the same assistance required for standing balance due to a few Lt lateral LOBs. Taught him an adaptive method to don his gripper socks. Worked on Lt grip strengthening when applying lotion to lower legs, Min A to dispense lotion. Hair combing completed while standing in front of the sink with CGA for balance and then he returned to the recliner. Pt remained sitting up at close of session, all needs within reach and chair alarm set. Tx focus placed on ADL retraining, Lt NMR, functional transfers, and dynamic balance.   Pt also provided lavender aromatherapy to address feelings of anxiety, pt appreciative  Therapy Documentation Precautions:  Precautions Precautions: Fall Precaution Comments: L inattention Restrictions Weight Bearing Restrictions: No Vital Signs: Therapy Vitals Temp: 98.1 F (36.7 C) Temp Source: Oral Pulse Rate: 63 Resp: 18 BP: 140/79 Patient Position (if appropriate): Sitting Oxygen  Therapy SpO2: 99 % O2 Device: Room Air Pain: Pain Assessment Pain Scale: 0-10 Pain Score: 0-No pain ADL: ADL Eating: Moderate cueing, Minimal assistance Where Assessed-Eating: Bed level Grooming: Maximal assistance Where Assessed-Grooming: Edge of bed Upper Body Bathing: Minimal assistance Where Assessed-Upper Body Bathing: Shower Lower Body Bathing: Minimal assistance Where Assessed-Lower Body Bathing: Shower Upper Body Dressing: Minimal cueing Where Assessed-Upper Body Dressing: Chair Lower Body Dressing: Minimal cueing, Minimal assistance Where Assessed-Lower Body Dressing: Chair Toileting: Minimal cueing, Minimal assistance Where Assessed-Toileting: Glass blower/designer: Therapist, music Method: Counselling psychologist: Energy manager: Not assessed Social research officer, government: Curator Method: Heritage manager: Grab bars, Transfer tub bench ADL Comments: Cues for hemi technique and hand over hand assist to utilize LUE at gross assist level   Therapy/Group: Individual Therapy  Findley Blankenbaker A Vaneta Hammontree 02/18/2021, 3:51 PM

## 2021-02-18 NOTE — Plan of Care (Signed)
°  Problem: Consults Goal: RH STROKE PATIENT EDUCATION Description: See Patient Education module for education specifics  Outcome: Progressing Goal: Nutrition Consult-if indicated Outcome: Progressing   Problem: RH BOWEL ELIMINATION Goal: RH STG MANAGE BOWEL WITH ASSISTANCE Description: STG Manage Bowel with [place]. Outcome: Progressing Goal: RH STG MANAGE BOWEL W/MEDICATION W/ASSISTANCE Description: STG Manage Bowel with Medication with [place]. Outcome: Progressing   Problem: RH SKIN INTEGRITY Goal: RH STG MAINTAIN SKIN INTEGRITY WITH ASSISTANCE Description: STG Maintain Skin Integrity With [place]. Outcome: Progressing   Problem: RH SAFETY Goal: RH STG ADHERE TO SAFETY PRECAUTIONS W/ASSISTANCE/DEVICE Description: STG Adhere to Safety Precautions With Cues and Reminders. Outcome: Progressing Goal: RH STG DECREASED RISK OF FALL WITH ASSISTANCE Description: STG Decreased Risk of Fall With [employer]. Outcome: Progressing   Problem: RH KNOWLEDGE DEFICIT Goal: RH STG INCREASE KNOWLEGDE OF HYPERLIPIDEMIA Description: Patient will demonstrate knowledge of medications used for HLD, dietary recommendations, and important lab values with educational materials and handouts provided by staff independently at discharge. Outcome: Progressing Goal: RH STG INCREASE KNOWLEDGE OF STROKE PROPHYLAXIS Description: Patient will demonstrate knowledge of medications used to prevent future strokes with educational materials and handouts provided by staff independently at discharge. Outcome: Progressing   Problem: RH Vision Goal: RH LTG Vision (Specify) Outcome: Progressing

## 2021-02-18 NOTE — Progress Notes (Signed)
Slept good. Denies pain or anxiety. Likes thickened ensure and thickened coffee. Taking meds crushed with applesauce. Ambulates to BR with HHA. LBM 01/07. Douglas Spencer A

## 2021-02-18 NOTE — Progress Notes (Signed)
Physical Therapy Session Note  Patient Details  Name: Douglas Spencer MRN: [id number] Date of Birth: [birth year]  Today's Date: 02/18/2021 PT Individual Time: 1005-1100 PT Individual Time Calculation (min): 55 min   Short Term Goals: Week 2:  PT Short Term Goal 1 (Week 2): = to LTGs based on ELOS  Skilled Therapeutic Interventions/Progress Updates:     Patient in recliner in the room upon PT arrival. Patient alert and agreeable to PT session. Patient denied pain during session.  Focused session on AD assessment during home and community level gait. Patient reports he will not use a cane or RW at home, but would allow his partner to assist him both in the home and in the community. Performed gait trials with RW and without AD, patient and PT in agreement on supervision household mobility and CGA-min A community mobility with 1 person assist for safety based on assessment below.   Patient reported that his goal is to be able to walk to the store ~1 mile up the street from his house with his partner, [name]. Patient reports the path is approximately 5 steps with B rails to the deck>25 ft level path>30 ft gravel driveway>2 blocks on the side of the street>3-4 blocks on the sidewalk (mild slope), 1 major road crossing at a light with walk signals>1 city block to the store Educated patient on barriers due to present deficits for this task such as balance, environmental distractions, need for navigation over multiple unlevel surfaces, and gait speed to cross the street safely. Addressed these barriers with interventions below.   Therapeutic Activity: Transfers: Patient performed sit to/from stand with close progressing to distant supervision without an AD without LOB or concerns for safety awareness.  Gait Training:  Patient ambulated >150 feet, >50 feet x2, and >300 feet without an AD with CGA progressing to close supervision. Ambulated with decreased gait speed, step height and length, decreased R  weight shift and stance time, decreased L arm swing, and variable foot placement with intermittent scissoring on turns only. Required min A x1 during R turn and x1 with increased environmental distraction. Provided verbal cues for equal SLS time and step length, increased arm swing and gait speed for improved balance, awareness of foot placement and reduced speed on turns, and mod cues for path finding throughout the unit. He ambulated in the ADL apartment transitioning from the doorway to the bathroom, then the recliner to the kitchen all with supervision and reaching out for stabilizing support on furniture x1. Required x2 attempts to get up from the recliner without increased assist due to rocking motion of the chair. Patient then performed a similar pattern through the apartment using a RW with supervision with reduced gait speed, catching RW x2 on furniture, and poor safety awareness with AD.  Patient ambulated up/down a ramp, over 10 feet of mulch (unlevel surface), and up/down a curb to simulate community ambulation over unlevel surfaces with close supervision for safety without an AD. Provided cues for awareness of change in surfaces x2.  Patient performed an obstacle course to simulate navigating and ambulating over unlevel and unpredictable surfaces.  Course 1: step over green exercise step, step around 4" step then tall mirror, watch foot placement with 3 ft path of rubber Legos scattered on the floor, step over 2 stacked glove boxes, walk around a chair and return through the course Trial 1: required CGA-close supervision with min A x1 due to LOB with foot placement around Legos Trial 2: provided min A  HHA to simulate walking with his partner, no LOB with added support on the R from HHA, increased gait speed and navigation through the course Course 2: step up and over green exercise step, step around 4" step then tall mirror, watch foot placement with 3 ft path of rubber Legos in new scattered  pattern on the floor, step over weighted stick lying on the floor, step over 2 stacked glove boxes, walk up and over blue foam wedge, walk around a chair with another weighted stick on the non-visible side, and return through the course Trial 1 and 2 with the same performance as previous course  Patient in recliner in the room at end of session with breaks locked, chair alarm set, and all needs within reach.   Therapy Documentation Precautions:  Precautions Precautions: Fall Precaution Comments: L inattention Restrictions Weight Bearing Restrictions: No    Therapy/Group: Individual Therapy  Elven Laboy L Kentaro Alewine PT, DPT  02/18/2021, 7:44 PM

## 2021-02-19 DIAGNOSIS — G40909 Epilepsy, unspecified, not intractable, without status epilepticus: Secondary | ICD-10-CM | POA: Diagnosis not present

## 2021-02-19 DIAGNOSIS — I639 Cerebral infarction, unspecified: Secondary | ICD-10-CM | POA: Diagnosis not present

## 2021-02-19 DIAGNOSIS — F1910 Other psychoactive substance abuse, uncomplicated: Secondary | ICD-10-CM | POA: Diagnosis not present

## 2021-02-19 DIAGNOSIS — I69322 Dysarthria following cerebral infarction: Secondary | ICD-10-CM | POA: Diagnosis not present

## 2021-02-19 LAB — CBC
HCT: 42.1 % (ref 39.0–52.0)
Hemoglobin: 14.6 g/dL (ref 13.0–17.0)
MCH: 30.1 pg (ref 26.0–34.0)
MCHC: 34.7 g/dL (ref 30.0–36.0)
MCV: 86.8 fL (ref 80.0–100.0)
Platelets: 259 10*3/uL (ref 150–400)
RBC: 4.85 MIL/uL (ref 4.22–5.81)
RDW: 13.5 % (ref 11.5–15.5)
WBC: 8.2 10*3/uL (ref 4.0–10.5)
nRBC: 0 % (ref 0.0–0.2)

## 2021-02-19 LAB — BASIC METABOLIC PANEL
Anion gap: 8 (ref 5–15)
BUN: 25 mg/dL — ABNORMAL HIGH (ref 8–23)
CO2: 29 mmol/L (ref 22–32)
Calcium: 8.9 mg/dL (ref 8.9–10.3)
Chloride: 101 mmol/L (ref 98–111)
Creatinine, Ser: 1.19 mg/dL (ref 0.61–1.24)
GFR, Estimated: >60 mL/min (ref >60)
Glucose, Bld: 93 mg/dL (ref 70–99)
Potassium: 4.2 mmol/L (ref 3.5–5.1)
Sodium: 138 mmol/L (ref 135–145)

## 2021-02-19 NOTE — Progress Notes (Signed)
Occupational Therapy Session Note  Patient Details  Name: Douglas Spencer MRN: [id number] Date of Birth: 1945/06/25 Today's Date: 02/19/2021 OT Individual Time: 8329-1916 OT Individual Time Calculation (min): 70 min    Short Term Goals: Week 2:  OT Short Term Goal 1 (Week 2): STG= LTG d/t ELOS   Skilled Therapeutic Interventions/Progress Updates:    Pt received sitting in the w/c with no c/o pain. Pt completed oral care in standing at the sink with close (S), occasional L sway but no LOB. OT assisted in cutting adhesive pads for dentures while he completed brushing of dentures. Pt completed 200 ft of functional mobility to the ADL apt with close (S)- CGA. Pt completed functional reaching tasks with the LUE, demonstrating great improvement in hand prehension, wrist stabilization and pronation/supination,and in shoulder flexion. He required extra time to motor plan and CGA facilitation with higher level distal manipulation. Pt's friend [name] arrived and a family education session was completed. Verbal education provided re fall risk reduction, energy conservation strategies, home carryover of transfer training, ADLs, and IADLs. Demonstration and hands on training completed for pt performance of UB/LB bathing and dressing at (S)- CGA level, toileting hygiene and transfers, and shower transfers. He also completed stair training with cueing provided for technique. [name] was able to provide the recommended CGA- (S) level care. Pt was passed off to PT in room.    Therapy Documentation Precautions:  Precautions Precautions: Fall Precaution Comments: L inattention Restrictions Weight Bearing Restrictions: No  Therapy/Group: Individual Therapy  Curtis Sites 02/19/2021, 6:12 AM

## 2021-02-19 NOTE — Discharge Summary (Signed)
Physician Discharge Summary  Patient ID: Douglas Spencer MRN: [id number] DOB/AGE: 03/27/1945 76 y.o.  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 COMPARISON:  01/31/2021, 01/30/2021 FINDINGS: Brain: Normal anatomic configuration. Parenchymal volume loss is commensurate with the patient's age. Mild periventricular white matter changes are present likely reflecting the sequela of small vessel ischemia. Subacute infarct involving the right lentiform nucleus and caudate body is again. Remote left cerebellar infarct again noted. No abnormal intra or extra-axial mass lesion or fluid collection. No abnormal mass effect or midline shift. No evidence of acute intracranial hemorrhage. Ventricular size is normal. Cerebellum unremarkable. Vascular: Stable hyperdensity of the a right MCA. Skull: Intact Sinuses/Orbits: Mild mucosal thickening within the frontal sinuses again noted. Remaining paranasal sinuses are clear. Orbits are unremarkable. Other: Mastoid air cells and middle ear cavities are clear. IMPRESSION: Involving infarct within the right basal ganglia again identified. Stable hyperdensity within the right MCA. No superimposed acute intracranial hemorrhage. Electronically Signed   By: Fidela Salisbury M.D.   On: 01/31/2021 22:58   CT HEAD WO CONTRAST (5MM)  Result Date: 01/31/2021 CLINICAL DATA:  Mental status change, unknown cause EXAM: CT HEAD WITHOUT CONTRAST  TECHNIQUE: Contiguous axial images were obtained from the base of the skull through the vertex without intravenous contrast. COMPARISON:  CTA and CT head from yesterday (01/30/2021). FINDINGS: Brain: Interval development of a hypodensity within the posterior right putamen and adjacent white matter. No significant mass effect. No midline shift. Age indeterminate infarct in the inferior left cerebellum. No acute hemorrhage. No visible mass lesion or extra-axial fluid collection. Similar remote infarct in the left caudate. Additional patchy white matter hypoattenuation, nonspecific but compatible with chronic microvascular disease. Vascular: Hyperdense appearance of some of the vessels is favored to reflect residual contrast from prior CTA and/or artifact. Skull: No acute fracture. Sinuses/Orbits: Mild paranasal sinus mucosal thickening. Unremarkable orbits. Other: No mastoid effusions. Cerumen in bilateral external auditory canals. IMPRESSION: 1. Interval development of a hypodensity in the right putamen and adjacent white matter, concerning for acute perforator infarct. Age indeterminate infarct in the inferior left cerebellum. If the patient is able, recommend MRI to further evaluate. 2. No acute hemorrhage or significant mass effect. 3. 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 These results will be called to the ordering clinician or representative by the Radiologist Assistant, and communication documented in the PACS or [employer]. Electronically Signed   By: Margaretha Sheffield M.D.   On: 01/31/2021 08:11   CT Head Wo Contrast  Result Date: 01/30/2021 CLINICAL DATA:  Left hemineglect, stroke status post tPA administration EXAM: CT HEAD WITHOUT CONTRAST TECHNIQUE: Contiguous axial images were obtained from the base of the skull through the vertex without intravenous contrast. COMPARISON:  9:46 p.m. FINDINGS: Brain: Normal anatomic configuration. Parenchymal volume loss is  commensurate with the patient's age. Mild periventricular white matter changes are present likely reflecting the sequela of small vessel ischemia. Remote left cerebellar infarct again noted. No abnormal intra or extra-axial mass lesion or fluid collection. No abnormal mass effect or midline shift. No  evidence of acute intracranial hemorrhage or infarct. Ventricular size is normal. Cerebellum unremarkable. Vascular: No asymmetric hyperdense vasculature at the skull base. Skull: Intact Sinuses/Orbits: Mild mucosal thickening within the frontal sinuses. Remaining paranasal sinuses are clear. Orbits are unremarkable. Other: Mastoid air cells and middle ear cavities are clear. IMPRESSION: No acute intracranial hemorrhage or infarct. Stable mild senescent change. Unchanged remote left cerebellar infarct. Electronically Signed   By: Fidela Salisbury M.D.   On: 01/30/2021 23:13   MR BRAIN WO CONTRAST  Result Date: 02/01/2021 CLINICAL DATA:  Acute neuro deficit. Rule out stroke. History of seizure. EXAM: MRI HEAD WITHOUT CONTRAST TECHNIQUE: Multiplanar, multiecho pulse sequences of the brain and surrounding structures were obtained without intravenous contrast. COMPARISON:  CT head 01/31/2021 FINDINGS: Brain: Acute infarct lenticulostriate territory on the right. Infarct involves the body of the caudate, putamen, external capsule, and corona radiata. No other acute infarct. Mild atrophy. Mild chronic microvascular ischemic change in the white matter. Negative for hemorrhage or mass. Vascular: Normal arterial flow voids in the skull base. Skull and upper cervical spine: No focal lesion. Sinuses/Orbits: Prominent superior ophthalmic vein bilaterally right greater than left. Review of the recent CTA head and neck 01/30/2021 reveals no significant enlargement of this vessel. No thrombus. No evidence of cavernous carotid fistula on CTA. Cavernous sinus is normal. No orbital mass. Mild mucosal edema paranasal sinuses. Other:  None IMPRESSION: Acute infarct right lenticulostriate territory. Moderately large territory involved. No intracranial hemorrhage Atrophy and mild chronic microvascular ischemic change Prominent superior ophthalmic vein bilaterally right greater left best seen on axial and coronal FLAIR. No associated abnormality on recent CT angio. This may be flow related artifact Electronically Signed   By: Franchot Gallo M.D.   On: 02/01/2021 13:18   DG Abd Portable 1V  Result Date: 02/02/2021 CLINICAL DATA:  NG tube placement EXAM: PORTABLE ABDOMEN - 1 VIEW COMPARISON:  None. FINDINGS: Weighted tip enteric tube with distal tip in the body of the stomach. Nonobstructive bowel gas pattern. 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 IMPRESSION: Weighted tip enteric tube with distal tip in the body of the stomach. Electronically Signed   By: Keane Police D.O.   On: 02/02/2021 12:43   DG Swallowing Func-Speech Pathology  Result Date: 02/20/2021 Table formatting from the original result was not included. Objective Swallowing Evaluation: Type of Study: MBS-Modified Barium Swallow Study  Patient Details Name: Taijon Vink MRN: [id number] Date of Birth: 1945/03/24 Today's Date: 02/20/2021 Past Medical History: Past Medical History: Diagnosis Date  ETOH abuse   Substance abuse ([place])  Past Surgical History: No past surgical history on file. HPI: See H&P  Subjective: its my birthday  Recommendations for follow up therapy are one component of a multi-disciplinary discharge planning process, led by the attending physician.  Recommendations may be updated based on patient status, additional functional criteria and insurance authorization. Assessment / Plan / Recommendation Clinical Impressions 02/20/2021 Clinical Impression Patient continues to demonstrate a moderate oropharyngeal dysphagia. Oral phase is characterized by impaired mastication and left anterior spillage with both solids and liquids. Pharyngeal phase is  characterized by mild base of tongue weakness and reduced epiglottic inversion resulting in mild residue that cleared with a second swallow. Patient also demonstrated reduced laryngeal closure with inconsistent timing resulting in intermittent penetration and instance of sensed aspiration of thin liquids.  Patient also with one episode of penetration of pureed textures to the level of the vocal cords that cleared with a cued throat clear.  Recommend patient continue  current diet of Dys. 2 textures with nectar-thick liquids.  Patient verbalized understanding and agreement. Of note, patient with what appears to be a prominent cricopharyngeal segment and mild retention of bolus within the upper esophagus with patient reports a long history of esophageal related symptoms. Discussed with physician the need for a possible GI consult. SLP Visit Diagnosis Dysphagia, oropharyngeal phase (R13.12) Attention and concentration deficit following -- Frontal lobe and executive function deficit following -- Impact on safety and function Moderate aspiration risk   Treatment Recommendations 02/20/2021 Treatment Recommendations Therapy as outlined in treatment plan below   Prognosis 02/20/2021 Prognosis for Safe Diet Advancement Good Barriers to Reach Goals Severity of deficits Barriers/Prognosis Comment -- Diet Recommendations 02/20/2021 SLP Diet Recommendations Dysphagia 2 (Fine chop) solids;Nectar thick liquid Liquid Administration via Cup Medication Administration Crushed with puree Compensations Slow rate;Small sips/bites;Follow solids with liquid;Clear throat intermittently;Effortful swallow;Multiple dry swallows after each bite/sip Postural Changes --   Other Recommendations 02/20/2021 Recommended Consults -- Oral Care Recommendations Oral care BID Other Recommendations -- Follow Up Recommendations Outpatient SLP Assistance recommended at discharge Frequent or constant Supervision/Assistance Functional Status Assessment Patient has  had a recent decline in their functional status and demonstrates the ability to make significant improvements in function in a reasonable and predictable amount of time. Frequency and Duration  02/20/2021 Speech Therapy Frequency (ACUTE ONLY) min 3x week Treatment Duration 1 week   Oral Phase 02/20/2021 Oral Phase Impaired Oral - Pudding Teaspoon -- Oral - Pudding Cup -- Oral - Honey Teaspoon -- Oral - Honey Cup -- Oral - Nectar Teaspoon NT Oral - Nectar Cup Left anterior bolus loss;Lingual/palatal residue Oral - Nectar Straw NT Oral - Thin Teaspoon -- Oral - Thin Cup Left anterior bolus loss;Lingual/palatal residue Oral - Thin Straw NT Oral - Puree Lingual/palatal residue Oral - Mech Soft Lingual/palatal residue Oral - Regular -- Oral - Multi-Consistency -- Oral - Pill NT Oral Phase - Comment --  Pharyngeal Phase 02/20/2021 Pharyngeal Phase Impaired Pharyngeal- Pudding Teaspoon -- Pharyngeal -- Pharyngeal- Pudding Cup -- Pharyngeal -- Pharyngeal- Honey Teaspoon -- Pharyngeal -- Pharyngeal- Honey Cup -- Pharyngeal -- Pharyngeal- Nectar Teaspoon NT Pharyngeal -- Pharyngeal- Nectar Cup Reduced epiglottic inversion;Reduced airway/laryngeal closure;Reduced tongue base retraction;Pharyngeal residue - valleculae Pharyngeal Material does not enter airway Pharyngeal- Nectar Straw NT Pharyngeal -- Pharyngeal- Thin Teaspoon -- Pharyngeal -- Pharyngeal- Thin Cup Reduced epiglottic inversion;Reduced airway/laryngeal closure;Penetration/Aspiration during swallow;Trace aspiration Pharyngeal Material enters airway, passes BELOW cords and not ejected out despite cough attempt by patient;Material does not enter airway;Material enters airway, remains ABOVE vocal cords and not ejected out Pharyngeal- Thin Straw NT Pharyngeal -- Pharyngeal- Puree Reduced epiglottic inversion;Reduced tongue base retraction;Penetration/Aspiration during swallow;Pharyngeal residue - valleculae Pharyngeal Material does not enter airway;Material enters airway,  passes BELOW cords then ejected out Pharyngeal- Mechanical Soft Reduced epiglottic inversion;Reduced tongue base retraction;Pharyngeal residue - valleculae Pharyngeal -- Pharyngeal- Regular -- Pharyngeal -- Pharyngeal- Multi-consistency -- Pharyngeal -- Pharyngeal- Pill -- Pharyngeal -- Pharyngeal Comment --  Cervical Esophageal Phase  02/20/2021 Cervical Esophageal Phase Impaired Pudding Teaspoon -- Pudding Cup -- Honey Teaspoon -- Honey Cup -- Nectar Teaspoon -- Nectar Cup Prominent cricopharyngeal segment Nectar Straw -- Thin Teaspoon Prominent cricopharyngeal segment Thin Cup Prominent cricopharyngeal segment Thin Straw NT Puree -- Mechanical Soft Prominent cricopharyngeal segment Regular NT Multi-consistency NT Pill NT Cervical Esophageal Comment -- PAYNE, COURTNEY 02/20/2021, 11:55 AM      Weston Anna, MA, CCC-SLP  DG Swallowing Func-Speech Pathology  Result Date: 02/06/2021 Table formatting from the original result was not included. Objective Swallowing Evaluation: Type of Study: MBS-Modified Barium Swallow Study  Patient Details Name: Sahas Sluka MRN: [id number] Date of Birth: 02-23-1945 Today's Date: 02/06/2021 Time: SLP Start Time (ACUTE ONLY): 0820 -SLP Stop Time (ACUTE ONLY): 0847 SLP Time Calculation (min) (ACUTE ONLY): 27 min Past Medical History: No past medical history on file. Past Surgical History: No past surgical history on file. HPI: Pt is a 76 y.o. male who presented to the ED secondary to right-sided gaze, left-sided weakness and drift, slurred speech. TNK given. 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 EEG 12/21,12/22 were WNL. 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  Subjective: its my birthday  Recommendations for follow up therapy are one component of a multi-disciplinary discharge  planning process, led by the attending physician.  Recommendations may be updated based on patient status, additional functional criteria and insurance authorization. Assessment / Plan / Recommendation Clinical Impressions 02/06/2021 Clinical Impression Pt demonstrates moderate oropharyngeal dysphagia with instance of trace sensed aspiration during the swallow with thin and nectar thick liquids with larger boluses, likely due to decreased laryngeal closure. Pt is able to achieve early and firm laryngeal closure with cues to "get ready to swallow" with nectar and thin liquids. Pt also with mild base of tongue weakness and reduced epiglottic deflection leading to mild residue with solids. Pt able to clear most penetrate/aspirate and residue with a throat clear and second swallow. Recommend a dys 2, vegetarian diet with nectar thick liquids. Pt is motivated to eat and following instructions well . Suggest removal of NG tube which may improve swallowing. SLP will f/u for further swallowing interventions. SLP Visit Diagnosis Dysphagia, oropharyngeal phase (R13.12) Attention and concentration deficit following -- Frontal lobe and executive function deficit following -- Impact on safety and function Moderate aspiration risk   Treatment Recommendations 02/06/2021 Treatment Recommendations Therapy as outlined in treatment plan below   Prognosis 02/06/2021 Prognosis for Safe Diet Advancement Good Barriers to Reach Goals -- Barriers/Prognosis Comment -- Diet Recommendations 02/06/2021 SLP Diet Recommendations Dysphagia 2 (Fine chop) solids;Nectar thick liquid Liquid Administration via Cup;Straw Medication Administration Whole meds with puree Compensations Slow rate;Small sips/bites;Follow solids with liquid;Clear throat intermittently;Effortful swallow;Multiple dry swallows after each bite/sip Postural Changes --   Other Recommendations 02/06/2021 Recommended Consults -- Oral Care Recommendations Oral care BID Other  Recommendations -- Follow Up Recommendations Acute inpatient rehab (3hours/day) Assistance recommended at discharge Frequent or constant Supervision/Assistance Functional Status Assessment Patient has had a recent decline in their functional status and demonstrates the ability to make significant improvements in function in a reasonable and predictable amount of time. Frequency and Duration  02/06/2021 Speech Therapy Frequency (ACUTE ONLY) min 2x/week Treatment Duration --   Oral Phase 02/06/2021 Oral Phase Impaired Oral - Pudding Teaspoon -- Oral - Pudding Cup -- Oral - Honey Teaspoon -- Oral - Honey Cup -- Oral - Nectar Teaspoon Lingual/palatal residue Oral - Nectar Cup Lingual/palatal residue Oral - Nectar Straw Lingual/palatal residue Oral - Thin Teaspoon -- Oral - Thin Cup Lingual/palatal residue Oral - Thin Straw Lingual/palatal residue Oral - Puree Lingual/palatal residue Oral - Mech Soft Lingual/palatal residue Oral - Regular -- Oral - Multi-Consistency -- Oral - Pill Lingual/palatal residue Oral Phase - Comment --  Pharyngeal Phase 02/06/2021 Pharyngeal Phase Impaired  Pharyngeal- Pudding Teaspoon -- Pharyngeal -- Pharyngeal- Pudding Cup -- Pharyngeal -- Pharyngeal- Honey Teaspoon -- Pharyngeal -- Pharyngeal- Honey Cup -- Pharyngeal -- Pharyngeal- Nectar Teaspoon Reduced epiglottic inversion;Reduced tongue base retraction;Reduced airway/laryngeal closure;Pharyngeal residue - valleculae Pharyngeal -- Pharyngeal- Nectar Cup Reduced epiglottic inversion;Reduced tongue base retraction;Reduced airway/laryngeal closure;Penetration/Aspiration during swallow;Trace aspiration;Pharyngeal residue - valleculae Pharyngeal Material enters airway, passes BELOW cords then ejected out;Material enters airway, passes BELOW cords and not ejected out despite cough attempt by patient;Material does not enter airway Pharyngeal- Nectar Straw Reduced epiglottic inversion;Reduced tongue base retraction;Pharyngeal residue - valleculae  Pharyngeal -- Pharyngeal- Thin Teaspoon -- Pharyngeal -- Pharyngeal- Thin Cup Penetration/Aspiration during swallow;Penetration/Aspiration before swallow;Reduced epiglottic inversion;Reduced tongue base retraction;Reduced airway/laryngeal closure;Pharyngeal residue - valleculae;Trace aspiration Pharyngeal Material enters airway, passes BELOW cords then ejected out;Material enters airway, passes BELOW cords and not ejected out despite cough attempt by patient;Material does not enter airway Pharyngeal- Thin Straw Penetration/Aspiration during swallow;Penetration/Aspiration before swallow;Reduced epiglottic inversion;Reduced tongue base retraction;Reduced airway/laryngeal closure;Pharyngeal residue - valleculae;Trace aspiration Pharyngeal Material enters airway, passes BELOW cords then ejected out;Material enters airway, passes BELOW cords and not ejected out despite cough attempt by patient;Material does not enter airway Pharyngeal- Puree Reduced epiglottic inversion;Reduced tongue base retraction;Pharyngeal residue - valleculae Pharyngeal -- Pharyngeal- Mechanical Soft Reduced epiglottic inversion;Reduced tongue base retraction;Pharyngeal residue - valleculae Pharyngeal -- Pharyngeal- Regular -- Pharyngeal -- Pharyngeal- Multi-consistency -- Pharyngeal -- Pharyngeal- Pill -- Pharyngeal -- Pharyngeal Comment --  No flowsheet data found. DeBlois, Katherene Ponto 02/06/2021, 9:56 AM                     EEG adult  Result Date: 01/31/2021 Lora Havens, MD     01/31/2021  8:22 AM Patient Name: Desiree Fleming MRN: [id number] Epilepsy Attending: Lora Havens Referring Physician/Provider: Dr Donnetta Simpers Date: 01/31/2021 Duration: 26.32 mins Patient history: 76 y.o. male with PMH significant for prior history of alcohol use but he quit drinking in January 2022, prior history of polysubstance abuse and smoker, prior history of seizure and noncompliant with his Keppra who presents with acute L sided weakness +  R gaze deviation and extinction on the left. EEG to evaluate for seizure Level of alertness: Awake AEDs during EEG study: LEV Technical aspects: This EEG study was done with scalp electrodes positioned according to the 10-20 International system of electrode placement. Electrical activity was acquired at a sampling rate of 500Hz  and reviewed with a high frequency filter of 70Hz  and a low frequency filter of 1Hz . EEG data were recorded continuously and digitally stored. Description: The posterior dominant rhythm consists of 9-10 Hz activity of moderate voltage (25-35 uV) seen predominantly in posterior head regions, symmetric and reactive to eye opening and eye closing. Hyperventilation and photic stimulation were not performed.   IMPRESSION: This study is within normal limits. No seizures or epileptiform discharges were seen throughout the recording. Priyanka [name]   Overnight EEG with video  Result Date: 01/31/2021 Lora Havens, MD     02/01/2021  9:26 AM Patient Name: Ventura Hollenbeck MRN: [id number] Epilepsy Attending: Lora Havens Referring Physician/Provider: Dr Donnetta Simpers Duration: 01/31/2021 0149 to 02/01/2021 0149  Patient history: 76 y.o. 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 EEG to evaluate for seizure  Level of alertness: Awake, asleep  AEDs during EEG study: LCM  Technical aspects: This EEG study was done with scalp electrodes positioned according to the 10-20 International system of electrode placement. Electrical activity was acquired at a sampling rate of 500Hz  and reviewed with a high frequency filter of 70Hz  and a low frequency filter of 1Hz . EEG data were recorded continuously and digitally stored.  Description: The posterior  dominant rhythm consists of 9-10 Hz activity of moderate voltage (25-35 uV) seen predominantly in posterior head regions, symmetric and reactive to eye opening and eye closing. Hyperventilation and photic stimulation were not performed.    IMPRESSION: This study is within normal limits. No seizures or epileptiform discharges were seen throughout the recording.  Lora Havens   ECHOCARDIOGRAM COMPLETE  Result Date: 01/31/2021    ECHOCARDIOGRAM REPORT   Patient Name:   MAYO FAULK Date of Exam: 01/31/2021 Medical Rec #:  [id number]      Height:       68.0 in Accession #:    [id number]     Weight:       131.4 lb Date of Birth:  02/18/1945     BSA:          1.710 m Patient Age:    59 years       BP:           153/92 mmHg Patient Gender: M              HR:           63 bpm. Exam Location:  Inpatient Procedure: 2D Echo, Cardiac Doppler and Color Doppler Indications:    Stroke  History:        Patient has no prior history of Echocardiogram examinations.  Sonographer:    Merrie Roof RDCS Referring Phys: [id number] [place]  1. Left ventricular ejection fraction, by estimation, is 60 to 65%. The left ventricle has normal function. The left ventricle has no regional wall motion abnormalities. Left ventricular diastolic parameters are consistent with Grade I diastolic dysfunction (impaired relaxation).  2. Right ventricular systolic function is normal. The right ventricular size is normal.  3. The mitral valve is myxomatous. Mild mitral valve regurgitation. No evidence of mitral stenosis.  4. The aortic valve is tricuspid. There is moderate calcification of the aortic valve. There is moderate thickening of the aortic valve. Aortic valve regurgitation is not visualized. Aortic valve sclerosis/calcification is present, without any evidence of aortic stenosis.  5. The inferior vena cava is normal in size with greater than 50% respiratory variability, suggesting right atrial pressure of 3 mmHg.  FINDINGS  Left Ventricle: Left ventricular ejection fraction, by estimation, is 60 to 65%. The left ventricle has normal function. The left ventricle has no regional wall motion abnormalities. The left ventricular internal cavity size was normal in size. There is  no left ventricular hypertrophy. Left ventricular diastolic parameters are consistent with Grade I diastolic dysfunction (impaired relaxation). Right Ventricle: The right ventricular size is normal. No increase in right ventricular wall thickness. Right ventricular systolic function is normal. Left Atrium: Left atrial size was normal in size. Right Atrium: Right atrial size was normal in size. Prominent Eustachian valve. Pericardium: There is no evidence of pericardial effusion. 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 There is mild thickening of the mitral valve leaflet(s). Mild mitral annular calcification. Mild mitral valve regurgitation, with centrally-directed jet. No evidence of mitral valve stenosis. Tricuspid Valve: The tricuspid valve is normal in  structure. Tricuspid valve regurgitation is trivial. No evidence of tricuspid stenosis. Aortic Valve: The aortic valve is tricuspid. There is moderate calcification of the aortic valve. There is moderate thickening of the aortic valve. Aortic valve regurgitation is not visualized. Aortic valve sclerosis/calcification is present, without any  evidence of aortic stenosis. Aortic valve mean gradient measures 3.0 mmHg. Aortic valve peak gradient measures 5.1 mmHg. Aortic valve area, by VTI measures 2.21 cm. Pulmonic Valve: The pulmonic valve was normal in structure. Pulmonic valve regurgitation is not visualized. No evidence of pulmonic stenosis. Aorta: The aortic root is normal in size and structure. Venous: The inferior vena cava is normal in size with greater than 50% respiratory variability, suggesting right atrial pressure of 3 mmHg. IAS/Shunts: No atrial level shunt detected by color flow  Doppler.  LEFT VENTRICLE PLAX 2D LVIDd:         3.70 cm   Diastology LVIDs:         2.50 cm   LV e' medial:    5.77 cm/s LV PW:         0.90 cm   LV E/e' medial:  12.6 LV IVS:        0.60 cm   LV e' lateral:   6.09 cm/s LVOT diam:     1.90 cm   LV E/e' lateral: 12.0 LV SV:         58 LV SV Index:   34 LVOT Area:     2.84 cm  RIGHT VENTRICLE RV Basal diam:  3.90 cm LEFT ATRIUM             Index        RIGHT ATRIUM           Index LA diam:        2.50 cm 1.46 cm/m   RA Area:     10.90 cm LA Vol (A2C):   36.8 ml 21.53 ml/m  RA Volume:   24.20 ml  14.16 ml/m LA Vol (A4C):   19.8 ml 11.58 ml/m LA Biplane Vol: 28.6 ml 16.73 ml/m  AORTIC VALVE AV Area (Vmax):    2.50 cm AV Area (Vmean):   2.45 cm AV Area (VTI):     2.21 cm AV Vmax:           113.00 cm/s AV Vmean:          76.800 cm/s AV VTI:            0.263 m AV Peak Grad:      5.1 mmHg AV Mean Grad:      3.0 mmHg LVOT Vmax:         99.80 cm/s LVOT Vmean:        66.400 cm/s LVOT VTI:          0.205 m LVOT/AV VTI ratio: 0.78  AORTA Ao Root diam: 2.90 cm MITRAL VALVE MV Area (PHT): 2.76 cm    SHUNTS MV Decel Time: 275 msec    Systemic VTI:  0.20 m MV E velocity: 72.80 cm/s  Systemic Diam: 1.90 cm MV A velocity: 88.70 cm/s MV E/A ratio:  0.82 Mihai Croitoru MD Electronically signed by Sanda Klein MD Signature Date/Time: 01/31/2021/6:37:43 PM    Final    CT HEAD CODE STROKE WO CONTRAST  Result Date: 01/30/2021 CLINICAL DATA:  Code stroke. EXAM: CT HEAD WITHOUT CONTRAST TECHNIQUE: Contiguous axial images were obtained from the base of the skull through the vertex without intravenous contrast. COMPARISON:  Prior MRI from  02/28/2020 FINDINGS: Brain: Age-related cerebral atrophy with mild chronic small vessel ischemic disease. Remote left cerebellar infarct. No acute intracranial hemorrhage. No acute large vessel territory infarct. No mass lesion, midline shift or mass effect. No hydrocephalus or extra-axial fluid collection. Vascular: No hyperdense vessel.  Skull: Scalp soft tissues and calvarium demonstrate no acute finding. Sinuses/Orbits: Right gaze noted. Mild scattered mucosal thickening noted within the ethmoidal air cells and maxillary sinuses. Paranasal sinuses are otherwise largely clear. No mastoid effusion. Other: None. ASPECTS [facility] Stroke Program Early CT Score) - Ganglionic level infarction (caudate, lentiform nuclei, internal capsule, insula, M1-M3 cortex): 7 - Supraganglionic infarction (M4-M6 cortex): 3 Total score (0-10 with 10 being normal): 10 IMPRESSION: 1. No acute intracranial abnormality. 2. ASPECTS is 10. 3. Age-related cerebral atrophy with chronic microvascular ischemic disease, with small remote left cerebellar infarct. These results were communicated to Dr. Lorrin Goodell at 10:01 pm on 01/30/2021 by text page via the [facility] messaging system. Electronically Signed   By: Jeannine Boga M.D.   On: 01/30/2021 22:04   CT ANGIO HEAD NECK W WO CM (CODE STROKE)  Result Date: 01/30/2021 CLINICAL DATA:  Initial evaluation for neuro deficit, stroke suspected. EXAM: CT ANGIOGRAPHY HEAD AND NECK TECHNIQUE: Multidetector CT imaging of the head and neck was performed using the standard protocol during bolus administration of intravenous contrast. Multiplanar CT image reconstructions and MIPs were obtained to evaluate the vascular anatomy. Carotid stenosis measurements (when applicable) are obtained utilizing NASCET criteria, using the distal internal carotid diameter as the denominator. CONTRAST:  33mL OMNIPAQUE IOHEXOL 350 MG/ML SOLN COMPARISON:  Head CT from earlier the same day. FINDINGS: CTA NECK FINDINGS Aortic arch: Visualized aortic arch normal caliber with normal 3 vessel morphology. Mild plaque about the arch itself. No hemodynamically significant stenosis about the origin the great vessels. Right carotid system: Right common and internal carotid arteries patent without stenosis or dissection. Mild for age atheromatous change about the  right carotid bulb without stenosis. Left carotid system: Left common and internal carotid arteries patent without stenosis or dissection. Minimal for age atheromatous change about the left carotid bulb without stenosis. Vertebral arteries: Both vertebral arteries arise from the subclavian arteries. No significant proximal subclavian artery stenosis. Vertebral arteries patent without stenosis, dissection or occlusion. Skeleton: No worrisome lytic or blastic osseous lesions. Mild for age cervical spondylosis. Patient is edentulous. Other neck: No other acute soft tissue abnormality within the neck. Upper chest: Visualized upper chest demonstrates no acute finding. Review of the MIP images confirms the above findings CTA HEAD FINDINGS Anterior circulation: Both internal carotid arteries widely patent to the termini without stenosis. A1 segments widely patent. Normal anterior communicating artery complex. Both anterior cerebral arteries widely patent to their distal aspects without stenosis. No M1 stenosis or occlusion. Normal MCA bifurcations. Distal MCA branches well perfused and symmetric. Posterior circulation: Both V4 segments patent to the vertebrobasilar junction without stenosis. Both PICA origins patent and normal. Basilar widely patent to its distal aspect without stenosis. Superior cerebellar arteries patent bilaterally. Both PCAs primarily supplied via the basilar and are well perfused to there distal aspects. Venous sinuses: Patent allowing for timing the contrast bolus. Anatomic variants: None significant.  No aneurysm. Review of the MIP images confirms the above findings IMPRESSION: 1. Negative CTA for large vessel occlusion. 2. Mild for age atheromatous change about the carotid bifurcations without hemodynamically significant stenosis. Critical Value/emergent results were called by telephone at the time of interpretation on 01/30/2021 at 10:07 pm to provider [facility] ,  who verbally acknowledged  these results. Electronically Signed   By: Jeannine Boga M.D.   On: 01/30/2021 22:19    Labs:  Basic Metabolic Panel: Recent Labs  Lab 02/19/21 0545 02/20/21 0512  NA 138 140  K 4.2 4.0  CL 101 104  CO2 29 27  GLUCOSE 93 85  BUN 25* 21  CREATININE 1.19 1.12  CALCIUM 8.9 8.8*    CBC: Recent Labs  Lab 02/19/21 0545  WBC 8.2  HGB 14.6  HCT 42.1  MCV 86.8  PLT 259    CBG: No results for input(s): GLUCAP in the last 168 hours.  Family history.  Positive for hypertension as well as hyperlipidemia.  Denies any colon cancer esophageal cancer or rectal cancer  Brief HPI:   Mase Dhondt is a 76 y.o. right-handed male with history of alcohol as well as polysubstance abuse, seizure disorder noncompliant with Keppra.  Per chart review lives with a friend.  Independent prior to admission.  Presented 01/30/2021 with acute onset of left-sided weakness as well as dysarthric speech.  Cranial CT scan negative for acute changes.  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  CT angiogram head and neck no large vessel occlusion.  MRI showed acute infarct right lenticulostriate territory.  Moderately large territory involved.  No intracranial hemorrhage.  Neurology follow-up patient consented to [facility]  Echocardiogram with ejection fraction of 60 to 65% no wall motion abnormalities.  Admission chemistries unremarkable except sodium 133 urine drug screen positive for marijuana as well as cocaine.  Maintained on aspirin and Plavix x3 weeks then aspirin alone.  Lovenox for DVT prophylaxis.  EEG showed no signs of seizure patient currently maintained on Vimpat.  N.p.o. with alternative means of nutritional support.  Therapy evaluations completed due to patient decreased functional mobility was admitted for a comprehensive rehab program.   Hospital Course: Coy Rochford was admitted to rehab 02/06/2021 for inpatient therapies to  consist of PT, ST and OT at least three hours five days a week. Past admission physiatrist, therapy team and rehab RN have worked together to provide customized collaborative inpatient rehab.  Pertaining to patient's right lacunar basal ganglia infarction remained stable aspirin and Plavix x3 weeks then aspirin alone.  Patient would follow-up neurology services.  Subcutaneous Lovenox for DVT prophylaxis no bleeding episodes.  Diet was slowly advanced to a dysphagia #1 nectar thick liquid.  History of seizure disorder EEG negative Vimpat as advised.  History of polysubstance alcohol abuse urine drug screen positive marijuana patient did receive counts regards to cessation of illicit drug products and nicotine.  Lipitor for hyperlipidemia.  He did have a facial lesion outpatient referral dermatology as needed.   Blood pressures were monitored on TID basis and soft and monitored     Rehab course: During patient's stay in rehab weekly team conferences were held to monitor patient's progress, set goals and discuss barriers to discharge. At admission, patient required moderate assist stand pivot transfers moderate assist 10 feet with a platform walker  Physical exam.  Blood pressure 133/82 pulse 70 temperature 97.8 respirations 18 oxygen saturation 95% room air Constitutional.  No acute distress HEENT Head.  Normocephalic and atraumatic Eyes.  Pupils round and reactive to light no discharge.nystagmus Neck.  Supple nontender no JVD without thyromegaly Cardiac regular rate and rhythm any extra sounds or murmur heard Abdomen.  Soft nontender positive bowel sounds without rebound Skin.  Few scattered abrasions and ecchymosis Neurologic.  Alert fair insight  and awareness.  Speech dysarthric.  Right gaze preference.  No focal field cuts.  Left upper extremity 1+ packs 2+ biceps and trace finger flexion.  Left lower extremity 4/5 hip flexors knee extension ankle dorsi plantarflexion.  Slightly decreased light  touch on left arm and leg but can sense pain.  He/She  has had improvement in activity tolerance, balance, postural control as well as ability to compensate for deficits. He/She has had improvement in functional use RUE/LUE  and RLE/LLE as well as improvement in awareness.  Perform gait trials with rolling walker and without assistive device.  Patient continued to reiterate he would not use a cane or rolling walker and have assistance from his partner.  Perform sit to stand with close supervision.  Ambulates to the ADL apartment transitioning from the doorway to the bathroom.  Contact-guard for amatory transfers without assistive device to the TTP.  Contact-guard minimal assist balance assist overall for standing.  Speech therapy follow-up on dysphagia diet.  Full family teaching completed plan discharged to home       Disposition: Discharged home    Diet: Dysphagia #1 nectar liquids  Special Instructions: No driving smoking alcohol or illicit drug use  Medications at discharge 1.  Tylenol as needed 2.  Amantadine 100 mg p.o.  daily 3.  Lipitor 40 mg p.o. daily 4.  Vimpat 50 mg p.o. twice daily 5.  Hydroxyzine 25 mg every 6 hours as needed anxiety 6.  MiraLAX daily 7.  Aspirin 81 mg p.o. daily 8  Plavix 75 mg daily x6 more days and stop 9.  Norvasc 10 mg daily   30-35 minutes were spent completing discharge summary and discharge planning  Discharge Instructions     Ambulatory referral to Neurology   Complete by: As directed    An appointment is requested in approximately: 4 weeks right basal ganglia infarction   Ambulatory referral to Physical Medicine Rehab   Complete by: As directed    Moderate complexity follow-up 1 to 2 weeks right basal ganglia infarction        Follow-up Information     Meredith Staggers, MD Follow up.   Specialty: Physical Medicine and Rehabilitation Why: Office to call for appointment Contact information: [address] [place] [address] [phone]                 Signed: Cathlyn Parsons 02/21/2021, 5:47 AM

## 2021-02-19 NOTE — Progress Notes (Signed)
Speech Language Pathology Daily Session Note  Patient Details  Name: Douglas Spencer MRN: [id number] Date of Birth: May 08, 1945  Today's Date: 02/19/2021 SLP Individual Time: 1200-1245 SLP Individual Time Calculation (min): 45 min  Short Term Goals: Week 2: SLP Short Term Goal 1 (Week 2): STGs=LTGs due to ELOS  Skilled Therapeutic Interventions: Skilled treatment session focused on dysphagia and cognitive goals. Patient independently recalled his swallowing compensatory strategies at the beginning of the meal. SLP facilitated session by providing skilled observation with trial tray of Dys. 2 textures. Patient demonstrated efficient mastication with mild oral residue and anterior spillage that patient self-monitored and corrected with supervision level verbal cues. Patient reporting increased dryness with Dys. 2 textures and utilized mashed potatoes and applesauce to add moisture.  Patient with mild intermittent overt s/s of aspiration throughout meal which patient reports is mostly due to liquids. Although overt s/s of aspiration persist, they appear significantly improved when patient is utilizing compensatory strategies. Recommend patient upgrade to Dys. 2 textures and remain on nectar-thick liquids. Patient verbalized understanding and agreement. Patient left upright in the recliner with alarm on and all needs within reach. Continue with current plan of care.      Pain No/Denies Pain   Therapy/Group: Individual Therapy  Tiphany Fayson 02/19/2021, 2:50 PM

## 2021-02-19 NOTE — Progress Notes (Signed)
Speech Language Pathology Daily Session Note  Patient Details  Name: Douglas Spencer MRN: [id number] Date of Birth: [birth year]  Today's Date: 02/19/2021 SLP Individual Time: 0932-6712 SLP Individual Time Calculation (min): 30 min  Short Term Goals: Week 2: SLP Short Term Goal 1 (Week 2): STGs=LTGs due to ELOS  Skilled Therapeutic Interventions: Skilled ST treatment focused on swallowing and cognitive goals. SLP facilitated session by discussing MBS scheduled for tomorrow to further evaluate oropharyngeal swallow function. Patient verbalized agreement. SLP addressed questions patient had regarding dysphagia, aspiration, and reducing risk for adverse events related to dysphagia. Discussed oral care recommendations including taking dentures out at night as an opportunity for them to be cleaned. Patient expressed he feels he cannot sleep without his dentures and prefers to sleep with them in. SLP assessed recall of safe swallowing compensatory strategies in which he independently recalled and verbalized with 100% accuracy (i.e., small bites/sips, slow rate of consumption, no talking with food/drink in mouth). Patient demonstrated use of these strategies while taking sips of nectar thick liquids during session with sup A and delayed throat clearing. Patient was left in wheelchair with alarm activated and immediate needs within reach at end of session. Continue per current plan of care.       Pain Pain Assessment Pain Scale: 0-10 Pain Score: 0-No pain  Therapy/Group: Individual Therapy  Patty Sermons 02/19/2021, 3:34 PM

## 2021-02-19 NOTE — Progress Notes (Addendum)
PROGRESS NOTE   Subjective/Complaints:  Up in bed. No new complaints. Doing well with diet even though he doesn't like it!  ROS: Patient denies fever, rash, sore throat, blurred vision, nausea, vomiting, diarrhea,  shortness of breath or chest pain, joint or back pain, headache, or mood change.   Objective:   No results found. Recent Labs    02/19/21 0545  WBC 8.2  HGB 14.6  HCT 42.1  PLT 259    Recent Labs    02/19/21 0545  NA 138  K 4.2  CL 101  CO2 29  GLUCOSE 93  BUN 25*  CREATININE 1.19  CALCIUM 8.9     Intake/Output Summary (Last 24 hours) at 02/19/2021 1013 Last data filed at 02/19/2021 0900 Gross per 24 hour  Intake 598 ml  Output --  Net 598 ml        Physical Exam: Vital Signs Blood pressure 139/73, pulse (!) 57, temperature (!) 97.4 F (36.3 C), resp. rate 18, height 5\' 8"  (1.727 m), weight 59.8 kg, SpO2 96 %.    Constitutional: No distress . Vital signs reviewed. HEENT: NCAT, EOMI, oral membranes moist Neck: supple Cardiovascular: RRR without murmur. No JVD    Respiratory/Chest: CTA Bilaterally without wheezes or rales. Normal effort    GI/Abdomen: BS +, non-tender, non-distended Ext: no clubbing, cyanosis, or edema Psych: pleasant and cooperative  Skin: facial lesion present (chronic) Neuro:  very alert. Improved insight. Phonation/speech are improved but still dysarthric. Right gaze preference slight.  Left central 7 and tongue deviation. LUE 1+ pec, 2+ biceps, trace hand. LLE 4/5 prox to distal. Decreased LT left arm>leg.  Musculoskeletal: no focal pain, normal rom    Assessment/Plan: 1. Functional deficits which require 3+ hours per day of interdisciplinary therapy in a comprehensive inpatient rehab setting. Physiatrist is providing close team supervision and 24 hour management of active medical problems listed below. Physiatrist and rehab team continue to assess barriers to  discharge/monitor patient progress toward functional and medical goals  Care Tool:  Bathing    Body parts bathed by patient: Left arm, Chest, Abdomen, Front perineal area, Right upper leg, Left upper leg, Face, Right arm, Buttocks, Left lower leg, Right lower leg   Body parts bathed by helper: Right arm, Buttocks, Right lower leg, Left lower leg     Bathing assist Assist Level: Contact Guard/Touching assist     Upper Body Dressing/Undressing Upper body dressing   What is the patient wearing?: Pull over shirt    Upper body assist Assist Level: Set up assist    Lower Body Dressing/Undressing Lower body dressing      What is the patient wearing?: Incontinence brief, Pants     Lower body assist Assist for lower body dressing: Minimal Assistance - Patient > 75%     Toileting Toileting Toileting Activity did not occur (Clothing management and hygiene only): N/A (no void or bm)  Toileting assist Assist for toileting: Contact Guard/Touching assist     Transfers Chair/bed transfer  Transfers assist     Chair/bed transfer assist level: Supervision/Verbal cueing     Locomotion Ambulation   Ambulation assist      Assist level: Supervision/Verbal  cueing Assistive device: No Device Max distance: >300 ft   Walk 10 feet activity   Assist     Assist level: Supervision/Verbal cueing Assistive device: No Device   Walk 50 feet activity   Assist Walk 50 feet with 2 turns activity did not occur: Safety/medical concerns  Assist level: Supervision/Verbal cueing Assistive device: No Device    Walk 150 feet activity   Assist Walk 150 feet activity did not occur: Safety/medical concerns  Assist level: Supervision/Verbal cueing Assistive device: No Device    Walk 10 feet on uneven surface  activity   Assist Walk 10 feet on uneven surfaces activity did not occur: Safety/medical concerns   Assist level: Minimal Assistance - Patient > 75% Assistive device:  Other (comment) (none)   Wheelchair     Assist Is the patient using a wheelchair?: No Type of Wheelchair: Manual    Wheelchair assist level: Minimal Assistance - Patient > 75% Max wheelchair distance: 52 ft    Wheelchair 50 feet with 2 turns activity    Assist        Assist Level: Minimal Assistance - Patient > 75%   Wheelchair 150 feet activity     Assist      Assist Level: Total Assistance - Patient < 25%   Blood pressure 139/73, pulse (!) 57, temperature (!) 97.4 F (36.3 C), resp. rate 18, height 5\' 8"  (1.727 m), weight 59.8 kg, SpO2 96 %.  Medical Problem List and Plan: 1.  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  Antithrombotics: -DVT/anticoagulation:  Pharmaceutical: Lovenox             -antiplatelet therapy: Aspirin 81 mg daily and Plavix 75 mg daily x3 weeks and aspirin alone 3. Pain Management: Tylenol as needed 4. Mood/sleep-wake:  less alert  -continue amantadine for arousal 100mg  bid which has helped             -antipsychotic agents: N/A 5. Neuropsych: This patient is capable of making decisions on his own behalf. 6. Skin/Wound Care: Routine skin checks 7. Fluids/Electrolytes/Nutrition:   - eating well  -1/9  BUN up to 25===>push fluids!  Recheck tomorrow 8.  Dysphagia.  pt with apparent chronic cough, tolerated purees and nectars with SLP---> now on D2/nectar diet  -continue amantadine for arousal  -SLP following. He's doing better with comp strategies. Less cough!  ?possible GI referral as OP depending upon his progress here  1/9- tolerating nectar thick liquids  9.  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  continue Vimpat 10.  History of alcohol tobacco polysubstance use.  Urine drug screen positive cocaine as well as marijuana.   Provide counseling as appropriate 11.  Hyperlipidemia.  continue Lipitor 12. Facial lesion: outpatient referral to dermatology upon d/c 13. Constipation  Moving bowels    LOS: 13 days A FACE TO FACE EVALUATION WAS PERFORMED  Meredith Staggers 02/19/2021, 10:13 AM

## 2021-02-19 NOTE — Consult Note (Signed)
Neuropsychological Consultation   Patient:   Douglas Spencer   DOB:   14-Jan-1946  MR Number:  [id number]  Location:  [facility] [id number] [place] [address] Dept: [place]: [phone]           Date of Service:   02/19/2021  Start Time:   10 AM End Time:   11 AM  Provider/Observer:  Ilean Skill, Psy.D.       Clinical Neuropsychologist       Billing Code/Service: [phone]  Chief Complaint:    Douglas Spencer is a 76 year old male with a past history of alcohol use/polysubstance abuse and tobacco use.  Patient has been diagnosed with a seizure disorder and has had times when he is noncompliant with Keppra.  Patient presented on 01/30/2021 with acute onset of left-sided weakness as well as dysarthria.  Cranial CT scan was negative for acute changes but identified age-related cerebral atrophy and chronic microvascular ischemic disease and indication of remote left cerebellar infarct.  MRI showed acute infarct right lenticulostriate territory.  Patient had therapy evaluations due to decreased functional ability with left-sided weakness and was admitted to CIR.  Reason for Service:  Patient was referred for neuropsychological consult due to adjustment issues and frustration and history of medication noncompliance.  Below is the HPI for the current admission.  HPI: Douglas Spencer is a 76 year old right-handed male with history of alcohol use/polysubstance and tobacco use as well as seizure disorder noncompliant with Keppra.  Per chart review lives with a friend.  Two-level home one-step to entry.  Reportedly independent prior to admission.  Presented 01/30/2021 with acute onset of left side weakness as well as dysarthric speech.  Cranial CT scan negative for acute changes.  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  CT  angiogram of head and neck no large vessel occlusion.  MRI showed acute infarct right lenticulostriate territory.  Moderately large territory involved.  No intracranial hemorrhage.  Neurology follow-up patient consented to tNKASE.  Echocardiogram with ejection fraction of 60 to 65% no wall motion abnormalities.  Admission chemistries were unremarkable except sodium 133, urine drug screen positive for marijuana as well as cocaine.  Currently maintained on aspirin 81 mg  daily and Plavix 75 mg daily for CVA prophylaxis x3 weeks then aspirin alone.  Lovenox for DVT prophylaxis.  EEG showed no signs of seizure patient currently maintained on Vimpat.  Currently n.p.o. with alternative means of nutritional support.  Therapy evaluations completed due to patient decreased functional ability left side weakness was admitted for a comprehensive rehab program. Pt had MBS today and was cleared for D3/nectars.  Current Status:  Upon entering the room, the patient was in his wheelchair and his partner was in the room with him.  Patient acknowledged significant frustration in both indicated prior difficulties with coordination and increasing emotional dyscontrol.  Some of these difficulties were attributed in the past to Keppra use.  Patient did describe symptoms develop in the past consistent with cerebellar infarcts.  Patient having difficulty adjusting to significant loss of functioning with primary changes having to do with significant slowing of motor functioning and cognitive speed.  Patient is described to have completely stopped any alcohol use after the development of seizure but admits to difficulty with Keppra.  Patient also is having difficulties with his denture and was set up for dentist appointment prior to his most recent stroke.  Behavioral Observation: Douglas Spencer  presents as a 76 y.o.-year-old Right handed Caucasian Male who appeared his stated age. his dress was Appropriate and he was Well Groomed and  his manners were Appropriate to the situation.  his participation was indicative of Inattentive and Redirectable behaviors.  There were physical disabilities noted.  he displayed an appropriate level of cooperation and motivation.     Interactions:    Active Redirectable  Attention:   abnormal and attention span appeared shorter than expected for age  Memory:   abnormal; remote memory intact, recent memory impaired  Visuo-spatial:  not examined  Speech (Volume):  low  Speech:   normal; slowed  Thought Process:  Coherent and Relevant  Though Content:  WNL; not suicidal and not homicidal  Orientation:   person and place  Judgment:   Fair  Planning:   Poor  Affect:    Irritable  Mood:    Dysphoric  Insight:   Fair  Intelligence:   normal  Substance Use:  Patient with history of polysubstance use and significant history of alcohol abuse  Medical History:   Past Medical History:  Diagnosis Date   ETOH abuse    Substance abuse ([place])          Patient Active Problem List   Diagnosis Date Noted   Protein-calorie malnutrition, severe 02/06/2021   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 No pertinent family history.   Impression/DX:  Douglas Spencer is a 76 year old male with a past history of alcohol use/polysubstance abuse and tobacco use.  Patient has been diagnosed with a seizure disorder and has had times when he is noncompliant with Keppra.  Patient presented on 01/30/2021 with acute onset of left-sided weakness as well as dysarthria.  Cranial CT scan was negative for acute changes but identified age-related cerebral atrophy and chronic microvascular ischemic disease and indication of remote left cerebellar infarct.  MRI showed acute infarct right lenticulostriate territory.  Patient had therapy  evaluations due to decreased functional ability with left-sided weakness and was admitted to CIR.  Upon entering the room, the patient was in his wheelchair and his partner was in the room with him.  Patient acknowledged significant frustration in both indicated prior difficulties with coordination and increasing emotional dyscontrol.  Some of these difficulties were attributed in the past to Keppra use.  Patient did describe symptoms develop in the past consistent with cerebellar infarcts.  Patient having difficulty adjusting to significant loss of functioning with primary changes having to do with significant slowing of motor functioning and cognitive speed.  Patient is described to have completely stopped any alcohol use after the development of seizure but admits to difficulty with Keppra.  Patient also is having difficulties with his denture and was set up for dentist appointment prior to his most recent stroke.  Disposition/Plan:  Today we worked on coping and adjustment issues with the patient as well as communicating with his significant other regarding plan and expectations going forward.  The patient will clearly need significant assistance upon discharge.  Diagnosis:    Infarction of right basal ganglia (HCC) - Plan: Ambulatory referral to Neurology, Ambulatory referral to Physical Medicine Rehab         Electronically Signed   _______________________ Ilean Skill, Psy.D. Clinical Neuropsychologist

## 2021-02-19 NOTE — Progress Notes (Signed)
Physical Therapy Session Note  Patient Details  Name: Douglas Spencer MRN: [id number] Date of Birth: 1945/11/22  Today's Date: 02/19/2021 PT Individual Time: 0942-1000 and 1400-1455 PT Individual Time Calculation (min): 18 min and 55 min   Short Term Goals: Week 2:  PT Short Term Goal 1 (Week 2): = to LTGs based on ELOS  Skilled Therapeutic Interventions/Progress Updates:     Session 1: Patient in recliner with [name], his care partner, in the room upon PT arrival. [place] off from [place], [place] for brief family education session due to [place] needing to leave prior to scheduled PT time. Reviewed providing supervision for all household mobility and HHA for community level mobility, [name] demonstrated technique well, following PT demonstration. Reviewed recommendations for following OPPT guide for progressing towards patient's goal of walking to the store with [name]. Encouraged initiation of outdoor mobility on the porch and to the driveway with HHA at this time, patient and [name] in agreement. Educated patient on fall risk/prevention, home modifications to prevent falls, and activation of emergency services in the event of a fall during session. Discussed reasoning for not recommending an AD device due to increased safety concerns due to attention and coordination challenges with AD use at this time. Patient and [name] appreciative of education and denied any further questions or concerns at this time. Patient in the recliner with [name] in the room with breaks locked, chair alarm set, and all needs in reach at end of session.  Session 2: Patient in recliner in the room upon PT arrival. Patient alert and agreeable to PT session. Patient denied pain during session.  Therapeutic Activity: Transfers: Patient performed sit to/from stand from the hospital recliner, toilet, and outdoor bench with supervision with x2 attempts on first stand. Provided verbal cues for forward and R weight shift x1. Patient was  continent of bowl during toileting, reports that his stool is loose, LPN made aware. Performed peri-care with set-up assist and lower body clothing management with mod A for L side.   Gait Training:  Patient ambulated >3000 feet without an AD with supervision over level indoor surfaces and with HHA with CGA and min A x2 due to minor LOB over unlevel sidewalk, sloped concrete, mulch, gravel, and asphalt, navigating around fallen limbs, rocks, over mounds and holes, and up down 1 curb to simulate community mobility for walking to the store with [name]. Ambulated with reciprocal gait pattern, decreased gait speed, decreased step length and height L>R, mild forward trunk lean, and downward head gaze. Provided verbal cues for attention to gait and slowing speed when approaching obstacles or curbs for improved safety.  Spend increased time discussing return to community level mobility and safety concerns walking near high traffic areas. Patient able to identify strategies to reduce risk of falling and for safely crossing street. Patient demonstrates improved safety awareness and awareness of deficits, but continues to have challenges with setting appropriate goals for his present deficits until they are pointed out to him at this time.   Patient in recliner in the room at end of session with breaks locked, chair alarm set, and all needs within reach.   Therapy Documentation Precautions:  Precautions Precautions: Fall Precaution Comments: L inattention Restrictions Weight Bearing Restrictions: No   Therapy/Group: Individual Therapy  Elynore Dolinski L Aycen Porreca PT, DPT  02/19/2021, 5:37 PM

## 2021-02-19 NOTE — Progress Notes (Addendum)
Patient ID: Douglas Spencer, male   DOB: 07-Jul-1945, 76 y.o.   MRN: [id number]  SW received phone call from pt friend/roommate [name] concerned about pt discharge needs and plan due to transportation concerns. SW informed that pt can be set up for [employer] since patient has cognitive deficits and cannot transport on his own. SW explained transportation only for patient. He was unsure on how patient can get to appointments safely. Stating patient will need to have home care instead. SW indicated will need to get updates from team members on patient abilities since patient has progressed to outpatient therapies. SW processed with pt alternative options on how he is able to get to locations with patient, he indicated he was unsure. SW reminded him he can setup pt for Medicaid transportation as well. SW explained discharge process. He is working on transportation for patient to get home.   SW sent referral for [employer]. SW faxed outpatient PT/OT/SLP to [facility] (O:[phone]).  Loralee Pacas, MSW, [place] Office: [phone] Cell: [phone] Fax: [phone]

## 2021-02-20 ENCOUNTER — Other Ambulatory Visit (HOSPITAL_COMMUNITY): Payer: Self-pay

## 2021-02-20 ENCOUNTER — Inpatient Hospital Stay (HOSPITAL_COMMUNITY): Payer: Medicare HMO

## 2021-02-20 DIAGNOSIS — G40909 Epilepsy, unspecified, not intractable, without status epilepticus: Secondary | ICD-10-CM | POA: Diagnosis not present

## 2021-02-20 DIAGNOSIS — F1910 Other psychoactive substance abuse, uncomplicated: Secondary | ICD-10-CM | POA: Diagnosis not present

## 2021-02-20 DIAGNOSIS — I69322 Dysarthria following cerebral infarction: Secondary | ICD-10-CM | POA: Diagnosis not present

## 2021-02-20 DIAGNOSIS — I639 Cerebral infarction, unspecified: Secondary | ICD-10-CM | POA: Diagnosis not present

## 2021-02-20 LAB — BASIC METABOLIC PANEL
Anion gap: 9 (ref 5–15)
BUN: 21 mg/dL (ref 8–23)
CO2: 27 mmol/L (ref 22–32)
Calcium: 8.8 mg/dL — ABNORMAL LOW (ref 8.9–10.3)
Chloride: 104 mmol/L (ref 98–111)
Creatinine, Ser: 1.12 mg/dL (ref 0.61–1.24)
GFR, Estimated: >60 mL/min (ref >60)
Glucose, Bld: 85 mg/dL (ref 70–99)
Potassium: 4 mmol/L (ref 3.5–5.1)
Sodium: 140 mmol/L (ref 135–145)

## 2021-02-20 MED ORDER — LACOSAMIDE 50 MG PO TABS
50.0000 mg | ORAL_TABLET | Freq: Two times a day (BID) | ORAL | 0 refills | Status: DC
  Filled 2021-02-20: qty 60, 30d supply, fill #0

## 2021-02-20 MED ORDER — ATORVASTATIN CALCIUM 40 MG PO TABS
40.0000 mg | ORAL_TABLET | Freq: Every day | ORAL | 0 refills | Status: DC
  Filled 2021-02-20: qty 30, 30d supply, fill #0

## 2021-02-20 MED ORDER — AMANTADINE HCL 50 MG/5ML PO SOLN
100.0000 mg | Freq: Two times a day (BID) | ORAL | 0 refills | Status: DC
  Filled 2021-02-20: qty 473, 24d supply, fill #0

## 2021-02-20 MED ORDER — CLOPIDOGREL BISULFATE 75 MG PO TABS
75.0000 mg | ORAL_TABLET | Freq: Every day | ORAL | 0 refills | Status: DC
  Filled 2021-02-20: qty 6, 6d supply, fill #0

## 2021-02-20 MED ORDER — ASPIRIN 81 MG PO CHEW: 81.0000 mg | CHEWABLE_TABLET | Freq: Every day | ORAL | Status: DC

## 2021-02-20 MED ORDER — POLYETHYLENE GLYCOL 3350 17 G PO PACK: 17.0000 g | PACK | Freq: Every day | ORAL | 0 refills | Status: DC

## 2021-02-20 MED ORDER — AMLODIPINE BESYLATE 10 MG PO TABS
10.0000 mg | ORAL_TABLET | Freq: Every day | ORAL | 0 refills | Status: DC
  Filled 2021-02-20: qty 30, 30d supply, fill #0

## 2021-02-20 MED ORDER — AMANTADINE HCL 50 MG/5ML PO SOLN
100.0000 mg | Freq: Every day | ORAL | 0 refills | Status: DC
  Filled 2021-02-20 (×2): qty 473, 47d supply, fill #0

## 2021-02-20 MED ORDER — HYDROXYZINE HCL 25 MG PO TABS
25.0000 mg | ORAL_TABLET | Freq: Four times a day (QID) | ORAL | 0 refills | Status: DC | PRN
  Filled 2021-02-20: qty 30, 8d supply, fill #0

## 2021-02-20 MED ORDER — AMANTADINE HCL 50 MG/5ML PO SOLN
100.0000 mg | Freq: Every day | ORAL | Status: DC
  Administered 2021-02-21: 100 mg via ORAL
  Filled 2021-02-20: qty 10

## 2021-02-20 MED ORDER — PANTOPRAZOLE SODIUM 40 MG PO PACK
40.0000 mg | PACK | Freq: Every day | ORAL | 0 refills | Status: DC
  Filled 2021-02-20: qty 30, 30d supply, fill #0

## 2021-02-20 MED ORDER — ACETAMINOPHEN 325 MG PO TABS: 650.0000 mg | ORAL_TABLET | ORAL | Status: DC | PRN

## 2021-02-20 NOTE — Progress Notes (Signed)
Occupational Therapy Discharge Summary  Patient Details  Name: Douglas Spencer MRN: [id number] Date of Birth: 27-May-1945  Today's Date: 02/20/2021 OT Individual Time: 8185-6314 OT Individual Time Calculation (min): 55 min    Patient has met 13 of 13 long term goals due to improved activity tolerance, improved balance, postural control, ability to compensate for deficits, functional use of  LEFT upper and LEFT lower extremity, improved attention, improved awareness, and improved coordination.  Patient to discharge at overall Supervision level.  Patient's care partner is independent to provide the necessary physical and cognitive assistance at discharge. Eliyohu has made fantastic progress in CIR. His LUE functional use has improved significantly and he can spontaneously grasp functional items and use it as a non-dominant level. Pt's friend [name] has attended OT family education and was able to demonstrate understanding of cueing and physical assist when needed.    Reasons goals not met: All treatment goals met.   Recommendation:  Patient will benefit from ongoing skilled OT services in outpatient setting to continue to advance functional skills in the area of iADL.  Equipment: BSC and TTB  Reasons for discharge: treatment goals met and discharge from hospital  Patient/family agrees with progress made and goals achieved: Yes  Skilled OT intervention: Pt received in the recliner with no c/o pain, agreeable to OT session focused on ADLs at shower level. Pt completed ambulatory transfer to the bathroom with close (S). He doffed clothing sit <> stand with close (S). Bathing at supervision level overall, occasional cueing required for L visual attention to improve L FMC. Pt completed UB dressing seated with set up assist, LB with (S). He demonstrates great carryover of previously provided edu in re to safety and fall risk reduction. Socks and shoes donned with set up assist. Pt completed 200 ft of  functional mobility at the (S) level to the therapy gym. He completed the 9 hold peg test with the R but was unable with the L, so used [facility] assist to work on [facility], but did not time L attempt. He then completed functional mobility over uneven surfaces- including mulch and moderate incline ramp with close (S). He returned to his room and was left sitting up with all needs met, chair alarm set.   OT Discharge Precautions/Restrictions  Precautions Precautions: Fall Precaution Comments: L inattention Restrictions Weight Bearing Restrictions: No   Pain Pain Assessment Pain Scale: 0-10 Pain Score: 0-No pain ADL ADL Eating: Set up Where Assessed-Eating: Chair Grooming: Supervision/safety Where Assessed-Grooming: Edge of bed Upper Body Bathing: Supervision/safety Where Assessed-Upper Body Bathing: Shower Lower Body Bathing: Supervision/safety Where Assessed-Lower Body Bathing: Shower Upper Body Dressing: Supervision/safety Where Assessed-Upper Body Dressing: Edge of bed Lower Body Dressing: Supervision/safety Where Assessed-Lower Body Dressing: Edge of bed Toileting: Supervision/safety Where Assessed-Toileting: Glass blower/designer: Close supervision Toilet Transfer Method: Counselling psychologist: Energy manager: Close [place] Transfer: Close supervision Social research officer, government Method: Heritage manager: Radio broadcast assistant ADL Comments: Cues for hemi technique and hand over hand assist to utilize LUE at gross assist level Vision Baseline Vision/History: 1 Wears glasses Patient Visual Report: No change from baseline Vision Assessment?: No apparent visual deficits Perception  Perception: Impaired Inattention/Neglect: Does not attend to left visual field;Does not attend to left side of body Praxis Praxis: Intact Cognition Overall Cognitive Status: Impaired/Different from baseline Arousal/Alertness:  Awake/alert Orientation Level: Oriented X4 Year: 2023 Month: January Day of Week: Correct Attention: Selective Selective Attention: Appears intact Memory: Impaired Memory Impairment: Retrieval  deficit;Decreased recall of new information Immediate Memory Recall: Sock;Blue;Bed Memory Recall Sock: Without Cue Memory Recall Blue: Without Cue Memory Recall Bed: Without Cue Awareness: Impaired Awareness Impairment: Anticipatory impairment Safety/Judgment: Impaired Comments: improved safety awareness, does require cues for slowing down and attending to tasks to maintain safety with mobility Sensation Sensation Light Touch: Appears Intact Hot/Cold: Appears Intact Proprioception: Appears Intact Coordination Gross Motor Movements are Fluid and Coordinated: No Fine Motor Movements are Fluid and Coordinated: No Coordination and Movement Description: mild L hemi Finger Nose Finger Test: WFL on BUE 9 Hole Peg Test: R: 23, L: 30 Motor  Motor Motor: Hemiplegia;Abnormal postural alignment and control Motor - Discharge Observations: mild L hemi UE>LE Mobility  Bed Mobility Bed Mobility: Rolling Right;Rolling Left;Supine to Sit;Sitting - Scoot to [employer] of Bed;Sit to Supine Rolling Right: Independent Rolling Left: Independent Supine to Sit: Independent Sit to Supine: Independent Transfers Sit to Stand: Independent Stand to Sit: Independent  Trunk/Postural Assessment  Cervical Assessment Cervical Assessment: Within Functional Limits Thoracic Assessment Thoracic Assessment: Exceptions to [facility] (rounded shoulders) Lumbar Assessment Lumbar Assessment: Exceptions to [facility] (posterior pelvic tilt) Postural Control Postural Control: Deficits on evaluation (righting reactions delayed)  Balance Balance Balance Assessed: Yes Static Sitting Balance Static Sitting - Balance Support: Feet supported Static Sitting - Level of Assistance: 7: Independent Dynamic Sitting Balance Dynamic Sitting -  Balance Support: Feet supported Dynamic Sitting - Level of Assistance: 7: Independent Static Standing Balance Static Standing - Balance Support: During functional activity Static Standing - Level of Assistance: 7: Independent Dynamic Standing Balance Dynamic Standing - Balance Support: During functional activity Dynamic Standing - Level of Assistance: 5: Stand by assistance Extremity/Trunk Assessment RUE Assessment RUE Assessment: Within Functional Limits LUE Assessment LUE Assessment: Exceptions to [facility] Active Range of Motion (AROM) Comments: Shoulder flexion to 100 degrees LUE Body System: Neuro Brunstrum levels for arm and hand: Arm;Hand Brunstrum level for arm: Stage V Relative Independence from [employer] Brunstrum level for hand: Stage VI Isolated joint movements   Curtis Sites 02/20/2021, 1:51 PM

## 2021-02-20 NOTE — TOC Benefit Eligibility Note (Signed)
Patient Advocate Encounter   Received notification that prior authorization for Hyrdoxyzine HLC 25 mg tablets is required.   PA submitted on 02/20/2021 Key [id number] Status is pending       Lyndel Safe, [place] Patient Advocate Specialist [place] Patient Advocate Team Direct Number: [phone]  Fax: [phone]

## 2021-02-20 NOTE — Progress Notes (Signed)
Speech Language Pathology Daily Session Note  Patient Details  Name: Douglas Spencer MRN: [id number] Date of Birth: [birth year]  Today's Date: 02/20/2021 SLP Individual Time: 0930-1000 SLP Individual Time Calculation (min): 30 min  Short Term Goals: Week 2: SLP Short Term Goal 1 (Week 2): STGs=LTGs due to ELOS  Skilled Therapeutic Interventions: Skilled treatment session focused on dysphagia goals. SLP facilitated session by providing education regarding results of repeat MBS while utilizing the video for reinforcement of information. SLP also educated on importance of utilizing swallowing compensatory strategies with meals, patient verbalized understanding and agreement. Patient consumed several sips of thin liquids via cup resulting in a wet vocal quality or explosive coughing episodes. Recommend patient continue nectar-thick liquids. Patient verbalized understanding. Patient left upright in recliner with alarm on and all needs within reach. Continue with current plan of care.      Pain Pain Assessment Pain Scale: 0-10 Pain Score: 0-No pain  Therapy/Group: Individual Therapy  Aldine Grainger 02/20/2021, 11:56 AM

## 2021-02-20 NOTE — TOC Benefit Eligibility Note (Addendum)
Patient Advocate Encounter  Prior Authorization for Hydroxyzine HCL 25 mg tablets has been approved.    PA# [id number] Effective dates: 02/20/2021 through 02/10/2022      Lyndel Safe, [place] Patient Advocate Specialist [place] Patient Advocate Team Direct Number: [phone]  Fax: [phone]

## 2021-02-20 NOTE — Progress Notes (Signed)
Physical Therapy Session Note  Patient Details  Name: Douglas Spencer MRN: [id number] Date of Birth: 1945/11/25  Today's Date: 02/20/2021 PT Individual Time: 9244-6286 PT Individual Time Calculation (min): 24 min   Short Term Goals: Week 1:  PT Short Term Goal 1 (Week 1): Patient will perborm basic transfers with CGA using LRAD. PT Short Term Goal 1 - Progress (Week 1): Met PT Short Term Goal 2 (Week 1): Patient will ambulate >100 feet with CGA using LRAD. PT Short Term Goal 2 - Progress (Week 1): Met PT Short Term Goal 3 (Week 1): Patient will ascend/descend 6 steps with L rail with min A-CGA simulating home set-up. PT Short Term Goal 3 - Progress (Week 1): Met PT Short Term Goal 4 (Week 1): Patient will improve Berg Balance Scale score by MDC (7 points) PT Short Term Goal 4 - Progress (Week 1): Met Week 2:  PT Short Term Goal 1 (Week 2): = to LTGs based on ELOS  Skilled Therapeutic Interventions/Progress Updates:   Received pt sitting in recliner, pt agreeable to PT treatment, and denied any pain during session. Pt with no questions/concerns regarding discharge home tomorrow. Pt states he feels like he is getting back to "normal" and that his L lateral lean has improved significantly. Session with emphasis on discharge planning and outcome measures. Pt performed all transfers without AD and supervision throughout session with 1 minor instance of posterior LOB initially with standing up from chair, requiring CGA to stabilize. Pt performed 5x sit<>stand in 15 seconds (>15 seconds indicates risk of recurrent falls in individuals >10 years of age). Pt able to stand and pick up lotion bottle from floor with CGA and no AD. Pt then performed 6MWT without AD and CGA (but just to hold pants up, otherwise supervision) and pt ambulated a total distance of 958f with no standing rest breaks. Concluded session with pt sitting in recliner, needs within reach, and chair pad alarm on.   Therapy  Documentation Precautions:  Precautions Precautions: Fall Precaution Comments: L inattention Restrictions Weight Bearing Restrictions: No  Therapy/Group: Individual Therapy AAlfonse AlpersPT, DPT   02/20/2021, 10:59 AM

## 2021-02-20 NOTE — Progress Notes (Signed)
Patient ID: Douglas Spencer, male   DOB: 09/22/1945, 75 y.o.   MRN: [id number] ° °SW met with pt in room to provide updates from team conference, and d/c remains tomorrow. Pt would like SW to find PCP. He was informed that [facility] transportation will allow his friend to travel with him. He is aware SW will follow-up with [name].  ° °*SW left message for pt friend [name] informing on above, and encouraged follow-up.  ° ° , MSW, LCSWA °Office: [phone] °Cell: [phone] °Fax: [phone]  ° °

## 2021-02-20 NOTE — Progress Notes (Signed)
Occupational Therapy Session Note  Patient Details  Name: Douglas Spencer MRN: [id number] Date of Birth: 1946/01/25  Today's Date: 02/20/2021 OT Individual Time: 1130-1200 OT Individual Time Calculation (min): 30 min    Short Term Goals: Week 2:  OT Short Term Goal 1 (Week 2): STG= LTG d/t ELOS  Skilled Therapeutic Interventions/Progress Updates:    Pt received sitting in the w/c with no c/o pin, agreeable to OT session. He completed 200 ft of functional mobility with close (S) to CGA. Session focused on sequencing turns during ADL transfers to increase safety and dynamic balance, as well as to work on concentric/eccentric control during sit <> stands. Min cueing for L inattention during session. Pt completed 100 ft of functional mobility back to his room, with close (S). He was given a pack of theraputty and two handouts for [facility] exercises and for theraputty instructions. Provided demo re theraputty use. Instructed pt to wash hands after use and before eating. Pt was left sitting up with all needs met, chair alarm set.   Therapy Documentation Precautions:  Precautions Precautions: Fall Precaution Comments: L inattention Restrictions Weight Bearing Restrictions: No  Therapy/Group: Individual Therapy  Curtis Sites 02/20/2021, 6:26 AM

## 2021-02-20 NOTE — Progress Notes (Addendum)
PROGRESS NOTE   Subjective/Complaints:  Pt without new complaints. Excited about going home tomorrow  ROS: Patient denies fever, rash, sore throat, blurred vision, nausea, vomiting, diarrhea, cough, shortness of breath or chest pain, joint or back pain, headache, or mood change.   Objective:   No results found. Recent Labs    02/19/21 0545  WBC 8.2  HGB 14.6  HCT 42.1  PLT 259    Recent Labs    02/19/21 0545 02/20/21 0512  NA 138 140  K 4.2 4.0  CL 101 104  CO2 29 27  GLUCOSE 93 85  BUN 25* 21  CREATININE 1.19 1.12  CALCIUM 8.9 8.8*     Intake/Output Summary (Last 24 hours) at 02/20/2021 0913 Last data filed at 02/20/2021 0308 Gross per 24 hour  Intake 480 ml  Output 500 ml  Net -20 ml        Physical Exam: Vital Signs Blood pressure 135/81, pulse 72, temperature (!) 97.5 F (36.4 C), temperature source Oral, resp. rate 16, height 5\' 8"  (1.727 m), weight 59.8 kg, SpO2 97 %.    Constitutional: No distress . Vital signs reviewed. HEENT: NCAT, EOMI, oral membranes moist Neck: supple Cardiovascular: RRR without murmur. No JVD    Respiratory/Chest: CTA Bilaterally without wheezes or rales. Normal effort    GI/Abdomen: BS +, non-tender, non-distended Ext: no clubbing, cyanosis, or edema Psych: pleasant and cooperative  Skin: facial lesion present (chronic) Neuro:  alert, mild dysarthria. Better phonation  Left central 7 and tongue deviation. LUE 1+ pec, 2+ biceps, trace hand. LLE 4/5 prox to distal. Decreased LT left arm>leg.  Musculoskeletal: Full ROM, No pain with AROM or PROM in the neck, trunk, or extremities. Posture appropriate     Assessment/Plan: 1. Functional deficits which require 3+ hours per day of interdisciplinary therapy in a comprehensive inpatient rehab setting. Physiatrist is providing close team supervision and 24 hour management of active medical problems listed below. Physiatrist  and rehab team continue to assess barriers to discharge/monitor patient progress toward functional and medical goals  Care Tool:  Bathing    Body parts bathed by patient: Left arm, Chest, Abdomen, Front perineal area, Right upper leg, Left upper leg, Face, Right arm, Buttocks, Left lower leg, Right lower leg   Body parts bathed by helper: Right arm, Buttocks, Right lower leg, Left lower leg     Bathing assist Assist Level: Contact Guard/Touching assist     Upper Body Dressing/Undressing Upper body dressing   What is the patient wearing?: Pull over shirt    Upper body assist Assist Level: Set up assist    Lower Body Dressing/Undressing Lower body dressing      What is the patient wearing?: Incontinence brief, Pants     Lower body assist Assist for lower body dressing: Minimal Assistance - Patient > 75%     Toileting Toileting Toileting Activity did not occur (Clothing management and hygiene only): N/A (no void or bm)  Toileting assist Assist for toileting: Contact Guard/Touching assist     Transfers Chair/bed transfer  Transfers assist     Chair/bed transfer assist level: Supervision/Verbal cueing     Locomotion Ambulation   Ambulation  assist      Assist level: Supervision/Verbal cueing Assistive device: No Device Max distance: >300 ft   Walk 10 feet activity   Assist     Assist level: Supervision/Verbal cueing Assistive device: No Device   Walk 50 feet activity   Assist Walk 50 feet with 2 turns activity did not occur: Safety/medical concerns  Assist level: Supervision/Verbal cueing Assistive device: No Device    Walk 150 feet activity   Assist Walk 150 feet activity did not occur: Safety/medical concerns  Assist level: Supervision/Verbal cueing Assistive device: No Device    Walk 10 feet on uneven surface  activity   Assist Walk 10 feet on uneven surfaces activity did not occur: Safety/medical concerns   Assist level: Minimal  Assistance - Patient > 75% Assistive device: Other (comment) (none)   Wheelchair     Assist Is the patient using a wheelchair?: No Type of Wheelchair: Manual    Wheelchair assist level: Minimal Assistance - Patient > 75% Max wheelchair distance: 52 ft    Wheelchair 50 feet with 2 turns activity    Assist        Assist Level: Minimal Assistance - Patient > 75%   Wheelchair 150 feet activity     Assist      Assist Level: Total Assistance - Patient < 25%   Blood pressure 135/81, pulse 72, temperature (!) 97.5 F (36.4 C), temperature source Oral, resp. rate 16, height 5\' 8"  (1.727 m), weight 59.8 kg, SpO2 97 %.  Medical Problem List and Plan: 1.  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  Antithrombotics: -DVT/anticoagulation:  Pharmaceutical: Lovenox             -antiplatelet therapy: Aspirin 81 mg daily and Plavix 75 mg daily x3 weeks and aspirin alone 3. Pain Management: Tylenol as needed 4. Mood/sleep-wake:  less alert  -decrease amantadine to 100mg  daily             -antipsychotic agents: N/A 5. Neuropsych: This patient is capable of making decisions on his own behalf. 6. Skin/Wound Care: Routine skin checks 7. Fluids/Electrolytes/Nutrition:   - eating well  -1/10 today's labs reviewed. Bun down to 21. Discussed importance of pushing fluids especially if he stays on nectars 8.  Dysphagia.  pt with apparent chronic cough, tolerated purees and nectars with SLP---> now on D2/nectar diet  -continue amantadine for arousal  - He's doing better with comp strategies. Less cough!  ?possible GI referral as OP depending upon his progress here  1/10- tolerating nectar thick liquids so far.   -MBS today with SLP 9.  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   continue Vimpat 10.  History of alcohol tobacco polysubstance use.  Urine drug screen positive cocaine as well as marijuana.  Provide counseling as appropriate 11.  Hyperlipidemia.  continue Lipitor 12. Facial lesion: outpatient referral to dermatology upon d/c 13. Constipation  Moving bowels    LOS: 14 days A FACE TO FACE EVALUATION WAS PERFORMED  Meredith Staggers 02/20/2021, 9:13 AM

## 2021-02-20 NOTE — Patient Care Conference (Signed)
Inpatient RehabilitationTeam Conference and Plan of Care Update Date: 02/20/2021   Time: 10:17 AM    Patient Name: Douglas Spencer      Medical Record Number: [id number]  Date of Birth: 03/15/1945 Sex: Male         Room/Bed: 4W21C/4W21C-01 Payor Info: Payor: [employer] MEDICARE / Plan: [employer] MEDICARE HMO/PPO / Product Type: *No Product type* /    Admit Date/Time:  02/06/2021  4:56 PM  Primary Diagnosis:  Infarction of right basal ganglia [facility])  Hospital Problems: Principal Problem:   Infarction of right basal ganglia [facility])    Expected Discharge Date: Expected Discharge Date: 02/21/21  Team Members Present: Physician leading conference: Dr. Alger Simons Social Worker Present: Loralee Pacas, [place] Nurse Present: Dorthula Nettles, RN PT Present: Canary Brim, PT OT Present: Laverle Hobby, OT SLP Present: Weston Anna, SLP PPS Coordinator present : Gunnar Fusi, SLP     Current Status/Progress Goal Weekly Team Focus  Bowel/Bladder   Continent of B/B. LBM 1/9  Remain continent  Assist with toileting needs as needed   Swallow/Nutrition/ Hydration   Dys. 2 textures with nectar-thick liquids, Intermittent supervision for use of compensatory strategies  Min A for use of strategies with least restrictive diet  Repeat MBS on 1/10 to assess swallow function, ongoing education with patient and caregiver   ADL's   (S) UB ADLs, min A LB ADLs and CGA toileting. Greatly improved LUE- 100 degrees shoulder AROM with functional grasp and reach.  supervision overall  LUE NMR, L attention, functional mobility, ADL, d/c planning   Mobility   Supervision overall, CGA community mobility >3000 ft, family education complete on 1/9  IND for bed mobility; IND/SUP for transfers; SUP for ambulation; CGA for stairs  d/c planning, safety awareness, outcome measure assessment, patient/caregiver education   Communication             Safety/Cognition/ Behavioral Observations  Supervision  Supervision-Min A   recall of compenstory strateies, selective attention, emergent awareness   Pain   No c/o pain  pain <3/10  Assess Qshift and prn   Skin   Skin intact  Maintain skin integrity  Assess Qshift and prn     Discharge Planning:  D/c to home with support from his caregiver/friend [name] who will provide 24/7 care. Caregiver has reported limited physical abilities due to back injuries but willing to provide all of pt care needs. SW will continue to confirm there are no barrieres to discharge.   Team Discussion: Had MBS today. Continue to push fluids. Discharging home with friend. No new nursing concerns. Doing great with therapy. Supervision upper body, contact guard lower body, toileting. Left inattention improving. Supervision to independent with transfers. Supervision with stairs. Ambulating >3000 ft. Swallowing improved. Trace aspiration, continues to cough consistently on unit. Dys 2 diet, nectar thick liquids discharge recommendation. Will have SLP education with friend tomorrow. Family education completed. Patient on target to meet rehab goals: yes, supervision with ADL's, independent to supervision for transfers, supervision with ambulation, and contact guard assist with stairs. Supervision to min assist for swallow safety and cognition.  *See Care Plan and progress notes for long and short-term goals.   Revisions to Treatment Plan:  Finalizing discharge plans  Teaching Needs: Family education, medication management, safety awareness, transfer/gait training, etc.  Current Barriers to Discharge: Decreased caregiver support, Home enviroment access/layout, Lack of/limited family support, and Medication compliance  Possible Resolutions to Barriers: Family education Follow-up SLP      Medical Summary Current Status: improving  left sided motor function. tolerating diet. needing to push fluids. bp controlled. ?outpt gi consult  Barriers to Discharge: Medical stability   Possible  Resolutions to [employer] Focus: daily assessment of labs and patient data, finalize medical plan for discharge   Continued Need for Acute Rehabilitation Level of Care: The patient requires daily medical management by a physician with specialized training in physical medicine and rehabilitation for the following reasons: Direction of a multidisciplinary physical rehabilitation program to maximize functional independence : Yes Medical management of patient stability for increased activity during participation in an intensive rehabilitation regime.: Yes Analysis of laboratory values and/or radiology reports with any subsequent need for medication adjustment and/or medical intervention. : Yes   I attest that I was present, lead the team conference, and concur with the assessment and plan of the team.   Cristi Loron 02/20/2021, 2:33 PM

## 2021-02-20 NOTE — Progress Notes (Signed)
Occupational Therapy Session Note  Patient Details  Name: Douglas Spencer MRN: [id number] Date of Birth: 12/19/1945  Today's Date: 02/20/2021 OT Individual Time: 5750-5183 OT Individual Time Calculation (min): 45 min    Short Term Goals: Week 2:  OT Short Term Goal 1 (Week 2): STG= LTG d/t ELOS  Skilled Therapeutic Interventions/Progress Updates:  Patient met seated in recliner in agreement with OT treatment session. 0/10 pain reported at rest and with activity. OT treatment session with focus on household mobility and LUE NMR in prep for ADLs. Sit to stand from recliner and functional mobility to and from therapy gym with close supervision to [place] guard. 1 L lateral LOB requiring Min guard to correct. Seated on mat table in rehab gym, patient completed grasp and release/stacking task with small disks initially. Activity downgraded to larger disks with patient requiring verbal cues for extension of wrist/digits. Able to stack 14 disks with increased time/effort. Patient easily distracted requiring cues for attention to task. Min A to don R/L gloves. Patient then able to clean all disks with wet wipe. Focus on translation of disk from palm to fingertips (although difficult). Session concluded with patient seated in recliner with call bell within reach, chair alarm activated and all needs met.   Therapy Documentation Precautions:  Precautions Precautions: Fall Precaution Comments: L inattention Restrictions Weight Bearing Restrictions: No General:    Therapy/Group: Individual Therapy  Ambika Zettlemoyer R [place]-Davis 02/20/2021, 10:24 AM

## 2021-02-20 NOTE — Progress Notes (Signed)
Modified Barium Swallow Progress Note  Patient Details  Name: Douglas Spencer MRN: [id number] Date of Birth: 03/17/1945  Today's Date: 02/20/2021  Modified Barium Swallow completed.  Full report located under Chart Review in the Imaging Section.  Brief recommendations include the following:  Clinical Impression  Patient continues to demonstrate a moderate oropharyngeal dysphagia. Oral phase is characterized by impaired mastication and left anterior spillage with both solids and liquids. Pharyngeal phase is characterized by mild base of tongue weakness and reduced epiglottic inversion resulting in mild residue that cleared with a second swallow. Patient also demonstrated reduced laryngeal closure with inconsistent timing resulting in intermittent penetration and instance of sensed aspiration of thin liquids.  Patient also with one episode of penetration of pureed textures to the level of the vocal cords that cleared with a cued throat clear.  Recommend patient continue current diet of Dys. 2 textures with nectar-thick liquids.  Patient verbalized understanding and agreement. Of note, patient with what appears to be a prominent cricopharyngeal segment and mild retention of bolus within the upper esophagus with patient reports a long history of esophageal related symptoms. Discussed with physician the need for a possible GI consult.   Swallow Evaluation Recommendations       SLP Diet Recommendations: Dysphagia 2 (Fine chop) solids;Nectar thick liquid   Liquid Administration via: Cup   Medication Administration: Crushed with puree   Supervision: Patient able to self feed;Intermittent supervision to cue for compensatory strategies   Compensations: Slow rate;Small sips/bites;Follow solids with liquid;Clear throat intermittently;Effortful swallow;Multiple dry swallows after each bite/sip       Oral Care Recommendations: Oral care BID        [name] 02/20/2021,11:53 AM

## 2021-02-20 NOTE — Progress Notes (Signed)
Physical Therapy Discharge Summary  Patient Details  Name: Douglas Spencer MRN: [id number] Date of Birth: January 09, 1946  Today's Date: 02/20/2021  Patient has met 9 of 10 long term goals due to improved activity tolerance, improved balance, improved postural control, increased strength, ability to compensate for deficits, improved attention, improved awareness, and improved coordination.  Patient to discharge at an ambulatory level Supervision without an AD, HHA community mobility.  Patient's care partner is independent to provide the necessary physical and cognitive assistance at discharge.  Reasons goals not met: Patient continues to require supervision for dynamic balance tasks without upper extremity support, patient and caregiver educated on need for supervision for all ambulation and standing activities at d/c, caregiver demonstrated ability to provide this during family education.   Recommendation:  Patient will benefit from ongoing skilled PT services in outpatient setting to continue to advance safe functional mobility, address ongoing impairments in balance, L hemi-body strength/motor control, activity tolerance, functional mobility, gait and stair training, community integration, patient/caregiver education, and minimize fall risk.  Equipment: No equipment provided  Reasons for discharge: treatment goals met  Patient/family agrees with progress made and goals achieved: Yes  PT Discharge Precautions/Restrictions Precautions Precautions: Fall Restrictions Weight Bearing Restrictions: No Pain Pain Assessment Pain Scale: 0-10 Pain Score: 0-No pain Pain Interference Pain Interference Pain Effect on Sleep: 0. Does not apply - I have not had any pain or hurting in the past 5 days Pain Interference with Therapy Activities: 1. Rarely or not at all Pain Interference with Day-to-Day Activities: 1. Rarely or not at all Vision/Perception  Vision - History Ability to See in Adequate  Light: 0 Adequate Perception Perception: Impaired Inattention/Neglect: Does not attend to left visual field;Does not attend to left side of body Praxis Praxis: Intact  Cognition Overall Cognitive Status: Impaired/Different from baseline Arousal/Alertness: Awake/alert Orientation Level: Oriented X4 Attention: Sustained Sustained Attention: Impaired Sustained Attention Impairment: Verbal complex;Functional basic Memory: Impaired Memory Impairment: Retrieval deficit;Decreased recall of new information Awareness Impairment: Emergent impairment Safety/Judgment: Impaired Comments: improved safety awareness, does require cues for slowing down and attending to tasks to maintain safety with mobility Sensation Sensation Light Touch: Appears Intact Hot/Cold: Appears Intact Proprioception: Appears Intact Coordination Gross Motor Movements are Fluid and Coordinated: No Fine Motor Movements are Fluid and Coordinated: No Coordination and Movement Description: mild L hemi, decreased balance/postural control Finger Nose Finger Test: WFL on BUE 9 Hole Peg Test: R hand: 30.12 L hand: unable Motor  Motor Motor: Hemiplegia;Abnormal postural alignment and control Motor - Discharge Observations: mild L hemi UE>LE  Mobility Bed Mobility Rolling Right: Independent Rolling Left: Independent Supine to Sit: Independent Sit to Supine: Independent Transfers Sit to Stand: Independent Stand to Sit: Independent Stand Pivot Transfers: Independent Transfer (Assistive device): None Locomotion  Gait Gait Assistance: Supervision/Verbal cueing Gait Distance (Feet): 909 Feet Assistive device: None Gait Gait Pattern: Impaired Gait Pattern: Step-through pattern;Decreased stance time - right;Decreased stride length;Decreased hip/knee flexion - left;Decreased weight shift to right;Lateral hip instability;Lateral trunk lean to left;Trunk flexed;Decreased trunk rotation;Poor foot clearance - left Gait  velocity: decreased Stairs / Additional Locomotion Stairs: Yes Stairs Assistance: Supervision/Verbal cueing Stair Management Technique: One rail Right;One rail Left Number of Stairs: 12 Height of Stairs: 6 Ramp: Supervision/Verbal cueing Curb: Supervision/Verbal cueing Wheelchair Mobility Wheelchair Mobility: No  Trunk/Postural Assessment  Cervical Assessment Cervical Assessment: Exceptions to [facility] (Forward head) Thoracic Assessment Thoracic Assessment: Exceptions to [facility] (Rounded shoulders; kyphotoc) Lumbar Assessment Lumbar Assessment: Exceptions to [facility] (Posterior pelvic tilt) Postural Control Postural Control: Deficits  on evaluation (Delayed; insufficient)  Balance Berg Balance Test Sit to Stand: Able to stand without using hands and stabilize independently Standing Unsupported: Able to stand safely 2 minutes Sitting with Back Unsupported but Feet Supported on Floor or Stool: Able to sit safely and securely 2 minutes Stand to Sit: Sits safely with minimal use of hands Transfers: Able to transfer safely, definite need of hands Standing Unsupported with Eyes Closed: Able to stand 10 seconds with supervision Standing Ubsupported with Feet Together: Able to place feet together independently but unable to hold for 30 seconds From Standing, Reach Forward with Outstretched Arm: Reaches forward but needs supervision From Standing Position, Pick up Object from Floor: Able to pick up shoe, needs supervision From Standing Position, Turn to Look Behind Over each Shoulder: Needs supervision when turning Turn 360 Degrees: Needs close supervision or verbal cueing Standing Unsupported, Alternately Place Feet on Step/Stool: Able to complete >2 steps/needs minimal assist Standing Unsupported, One Foot in Front: Loses balance while stepping or standing Standing on One Leg: Able to lift leg independently and hold equal to or more than 3 seconds Total Score: 33 Static Sitting Balance Static Sitting  - Level of Assistance: 7: Independent Dynamic Sitting Balance Dynamic Sitting - Level of Assistance: 7: Independent Static Standing Balance Static Standing - Balance Support: During functional activity Static Standing - Level of Assistance: 7: Independent Dynamic Standing Balance Dynamic Standing - Balance Support: During functional activity Dynamic Standing - Level of Assistance: 5: Stand by assistance Extremity Assessment  RLE Assessment RLE Assessment: Within Functional Limits Active Range of Motion (AROM) Comments: grossly WFL with functional mobility General Strength Comments: Grossly 4+/5 LLE Assessment LLE Assessment: Exceptions to [facility] Active Range of Motion (AROM) Comments: Grossly WFL with functional mobility General Strength Comments: Grossly 4/5  6 Min Walk Test:  Instructed patient to ambulate as quickly and as safely as possible for 6 minutes using LRAD. Patient was allowed to take standing rest breaks without stopping the test, but if the patient required a sitting rest break the clock would be stopped and the test would be over.  Results: 909 feet (277 meters, Avg speed 0.8 m/s) without AD with supervision. Results indicate that the patient has reduced endurance with ambulation compared to age matched norms (Males 82-79 y.o. = 527 meters).  Five times Sit to Stand Test (FTSS) Method: Use a straight back chair with a solid seat that is 16-18 high. Ask participant to sit on the chair with arms folded across their chest.   Instructions: Stand up and sit down as quickly as possible 5 times, keeping your arms folded across your chest.   Measurement: Stop timing when the participant stands the 5th time.  TIME: 15 seconds  Times > 13.6 seconds is associated with increased disability and morbidity (Guralnik, 2000) Times > 15 seconds is predictive of recurrent falls in healthy individuals aged 77 and older (Buatois, et al., 2008) Normal performance values in community  dwelling individuals aged 66 and older (Bohannon, 2006): 60-69 years: 11.4 seconds 70-79 years: 12.6 seconds 80-89 years: 14.8 seconds  MCID: ? 2.3 seconds for Vestibular Disorders Mariah Milling, 2006)    Doreene Burke PT, DPT Becky Sax PT, DPT  02/20/2021, 10:00 AM

## 2021-02-21 ENCOUNTER — Other Ambulatory Visit (HOSPITAL_COMMUNITY): Payer: Self-pay

## 2021-02-21 DIAGNOSIS — I69391 Dysphagia following cerebral infarction: Secondary | ICD-10-CM | POA: Diagnosis not present

## 2021-02-21 DIAGNOSIS — I639 Cerebral infarction, unspecified: Secondary | ICD-10-CM | POA: Diagnosis not present

## 2021-02-21 MED ORDER — CLOPIDOGREL BISULFATE 75 MG PO TABS
ORAL_TABLET | ORAL | 0 refills | Status: DC
  Filled 2021-02-21: qty 6, fill #0

## 2021-02-21 NOTE — Progress Notes (Signed)
Speech Language Pathology Discharge Summary  Patient Details  Name: Douglas Spencer MRN: [id number] Date of Birth: 19-Jun-1945  Today's Date: 02/21/2021 SLP Individual Time: 1100-1130 SLP Individual Time Calculation (min): 30 min   Skilled Therapeutic Interventions:  Skilled treatment session focused on completion of patient and caregiver education with the patient's friend, [name]. [name] was provided education regarding patient's current swallowing function, diet recommendations, appropriate textures, medication administration, swallowing compensatory strategies, how to appropriately thicken liquids, pharyngeal strengthening exercises, the importance of participating in f/u therapy and the need for a GI consult.  Handouts were given to reinforce information. Throughout session, patient's caregiver appeared mildly overwhelmed. The caregiver would ask a question and when the SLP would attempt to answer the question, he would begin to talk over her. There was another friend present, Pam who encouraged [name] to "listen" the SLP. By end of session, both friends verbalized understanding of all education. Patient will discharge home today with friends.   Patient has met 7 of 7 long term goals.  Patient to discharge at overall Supervision level.   Reasons goals not met: N/A   Clinical Impression/Discharge Summary: Patient has made excellent gains and has met 7 of 7 LTGS this admission. Patient was initially NPO with an extensive history of s/s of dysphagia at baseline without medical f/u or intervention. Based on patient reports and results from [facility], suspect some type of esophageal involvement with recommendations for a GI consult as well as current pharyngeal impairments.  Patient is currently consuming Dys. 2 textures with nectar-thick liquids. Patient with repeat MBS on 1/10 that showed intermittent, trace aspiration with thin liquids. Patient recommended to continue nectar-thick liquids. Patient is  consuming his current diet with minimal overt s/s of aspiration and requires intermittent supervision verbal cues for use of swallowing compensatory strategies. Patient continues to demonstrate an intermittent wet vocal quality with a low vocal intensity which impacts his overall speech intelligibility at times. Patient also requires overall intermittent supervision verbal cues to complete functional and familiar tasks safely in regards to problem solving, attention and awareness as patient can be impulsive at times. Patient and family education is complete and patient will discharge home with 24 hour supervision from friends. Patient would benefit from f/u SLP services to maximize his cognitive and swallowing function in order to reduce caregiver burden.   Care Partner:  Caregiver Able to Provide Assistance: Yes  Type of Caregiver Assistance: Physical;Cognitive  Recommendation:  Outpatient SLP (Intermittent supervision)  Rationale for SLP Follow Up: Maximize cognitive function and independence;Maximize swallowing safety;Reduce caregiver burden   Equipment: Thickener   Reasons for discharge: Discharged from hospital;Treatment goals met   Patient/Family Agrees with Progress Made and Goals Achieved: Yes    Raenah Murley, [place] 02/21/2021, 6:27 AM

## 2021-02-21 NOTE — Progress Notes (Signed)
Inpatient Rehabilitation Care Coordinator Discharge Note   Patient Details  Name: Douglas Spencer MRN: [id number] Date of Birth: 1945/11/13   Discharge location: D/c to home with friend Douglas Spencer that will be primary caregiver  Length of Stay: 14 days  Discharge activity level: Supervision  Home/community participation: Limited  Patient response EX:HBZJIR Literacy - How often do you need to have someone help you when you read instructions, pamphlets, or other written material from your doctor or pharmacy?: Never  Patient response CV:ELFYBO Isolation - How often do you feel lonely or isolated from those around you?: Never  Services provided included: MD, RD, PT, OT, SLP, RN, CM, TR, Pharmacy, Neuropsych, SW  Financial Services:  Financial Services Utilized: Medicaid ([place]) [employer] offered to/list presented to: Yes  Follow-up services arranged:  Outpatient, DME    Outpatient Servicies: [facility] for PT/OT/SLP DME : [employer] for 3in1 BSC and TTB    Patient response to transportation need: Is the patient able to respond to transportation needs?: Yes In the past 12 months, has lack of transportation kept you from medical appointments or from getting medications?: No In the past 12 months, has lack of transportation kept you from meetings, work, or from getting things needed for daily living?: No  Comments (or additional information):  Patient/Family verbalized understanding of follow-up arrangements:  Yes  Individual responsible for coordination of the follow-up plan: contact pt [phone]  or pt friend Douglas Spencer [phone]  Confirmed correct DME delivered: [name] 02/21/2021    [name]

## 2021-02-21 NOTE — Progress Notes (Signed)
Patient ID: Douglas Spencer, male   DOB: [birth year], 76 y.o.   MRN: [id number]  Pt scheduled for PCP on 3/6 with Dr. Scarlette Calico at 1:20pm with [facility].   SW met with pt and pt friend [name] in room to review discharge. SW went over outpatient therapies and [employer]. SW will have TTB and 3in1 BSC to be delivered to the home.  SW ordered DME with [employer] via parachute.   Loralee Pacas, MSW, [place] Office: [phone] Cell: [phone] Fax: [phone]

## 2021-02-21 NOTE — Progress Notes (Signed)
PROGRESS NOTE   Subjective/Complaints:  No new issues. Slept well. Excited about dc  ROS: Patient denies fever, rash, sore throat, blurred vision, nausea, vomiting, diarrhea, cough, shortness of breath or chest pain, joint or back pain, headache, or mood change.   Objective:   DG Swallowing Func-Speech Pathology  Result Date: 02/20/2021 Table formatting from the original result was not included. Objective Swallowing Evaluation: Type of Study: MBS-Modified Barium Swallow Study  Patient Details Name: Douglas Spencer MRN: [id number] Date of Birth: Jun 13, 1945 Today's Date: 02/20/2021 Past Medical History: Past Medical History: Diagnosis Date  ETOH abuse   Substance abuse ([place])  Past Surgical History: No past surgical history on file. HPI: See H&P  Subjective: its my birthday  Recommendations for follow up therapy are one component of a multi-disciplinary discharge planning process, led by the attending physician.  Recommendations may be updated based on patient status, additional functional criteria and insurance authorization. Assessment / Plan / Recommendation Clinical Impressions 02/20/2021 Clinical Impression Patient continues to demonstrate a moderate oropharyngeal dysphagia. Oral phase is characterized by impaired mastication and left anterior spillage with both solids and liquids. Pharyngeal phase is characterized by mild base of tongue weakness and reduced epiglottic inversion resulting in mild residue that cleared with a second swallow. Patient also demonstrated reduced laryngeal closure with inconsistent timing resulting in intermittent penetration and instance of sensed aspiration of thin liquids.  Patient also with one episode of penetration of pureed textures to the level of the vocal cords that cleared with a cued throat clear.  Recommend patient continue current diet of Dys. 2 textures with nectar-thick liquids.  Patient verbalized  understanding and agreement. Of note, patient with what appears to be a prominent cricopharyngeal segment and mild retention of bolus within the upper esophagus with patient reports a long history of esophageal related symptoms. Discussed with physician the need for a possible GI consult. SLP Visit Diagnosis Dysphagia, oropharyngeal phase (R13.12) Attention and concentration deficit following -- Frontal lobe and executive function deficit following -- Impact on safety and function Moderate aspiration risk   Treatment Recommendations 02/20/2021 Treatment Recommendations Therapy as outlined in treatment plan below   Prognosis 02/20/2021 Prognosis for Safe Diet Advancement Good Barriers to Reach Goals Severity of deficits Barriers/Prognosis Comment -- Diet Recommendations 02/20/2021 SLP Diet Recommendations Dysphagia 2 (Fine chop) solids;Nectar thick liquid Liquid Administration via Cup Medication Administration Crushed with puree Compensations Slow rate;Small sips/bites;Follow solids with liquid;Clear throat intermittently;Effortful swallow;Multiple dry swallows after each bite/sip Postural Changes --   Other Recommendations 02/20/2021 Recommended Consults -- Oral Care Recommendations Oral care BID Other Recommendations -- Follow Up Recommendations Outpatient SLP Assistance recommended at discharge Frequent or constant Supervision/Assistance Functional Status Assessment Patient has had a recent decline in their functional status and demonstrates the ability to make significant improvements in function in a reasonable and predictable amount of time. Frequency and Duration  02/20/2021 Speech Therapy Frequency (ACUTE ONLY) min 3x week Treatment Duration 1 week   Oral Phase 02/20/2021 Oral Phase Impaired Oral - Pudding Teaspoon -- Oral - Pudding Cup -- Oral - Honey Teaspoon -- Oral - Honey Cup -- Oral - Nectar Teaspoon NT Oral - Nectar Cup Left anterior bolus loss;Lingual/palatal  residue Oral - Nectar Straw NT Oral - Thin  Teaspoon -- Oral - Thin Cup Left anterior bolus loss;Lingual/palatal residue Oral - Thin Straw NT Oral - Puree Lingual/palatal residue Oral - Mech Soft Lingual/palatal residue Oral - Regular -- Oral - Multi-Consistency -- Oral - Pill NT Oral Phase - Comment --  Pharyngeal Phase 02/20/2021 Pharyngeal Phase Impaired Pharyngeal- Pudding Teaspoon -- Pharyngeal -- Pharyngeal- Pudding Cup -- Pharyngeal -- Pharyngeal- Honey Teaspoon -- Pharyngeal -- Pharyngeal- Honey Cup -- Pharyngeal -- Pharyngeal- Nectar Teaspoon NT Pharyngeal -- Pharyngeal- Nectar Cup Reduced epiglottic inversion;Reduced airway/laryngeal closure;Reduced tongue base retraction;Pharyngeal residue - valleculae Pharyngeal Material does not enter airway Pharyngeal- Nectar Straw NT Pharyngeal -- Pharyngeal- Thin Teaspoon -- Pharyngeal -- Pharyngeal- Thin Cup Reduced epiglottic inversion;Reduced airway/laryngeal closure;Penetration/Aspiration during swallow;Trace aspiration Pharyngeal Material enters airway, passes BELOW cords and not ejected out despite cough attempt by patient;Material does not enter airway;Material enters airway, remains ABOVE vocal cords and not ejected out Pharyngeal- Thin Straw NT Pharyngeal -- Pharyngeal- Puree Reduced epiglottic inversion;Reduced tongue base retraction;Penetration/Aspiration during swallow;Pharyngeal residue - valleculae Pharyngeal Material does not enter airway;Material enters airway, passes BELOW cords then ejected out Pharyngeal- Mechanical Soft Reduced epiglottic inversion;Reduced tongue base retraction;Pharyngeal residue - valleculae Pharyngeal -- Pharyngeal- Regular -- Pharyngeal -- Pharyngeal- Multi-consistency -- Pharyngeal -- Pharyngeal- Pill -- Pharyngeal -- Pharyngeal Comment --  Cervical Esophageal Phase  02/20/2021 Cervical Esophageal Phase Impaired Pudding Teaspoon -- Pudding Cup -- Honey Teaspoon -- Honey Cup -- Nectar Teaspoon -- Nectar Cup Prominent cricopharyngeal segment Nectar Straw -- Thin Teaspoon  Prominent cricopharyngeal segment Thin Cup Prominent cricopharyngeal segment Thin Straw NT Puree -- Mechanical Soft Prominent cricopharyngeal segment Regular NT Multi-consistency NT Pill NT Cervical Esophageal Comment -- PAYNE, COURTNEY 02/20/2021, 11:55 AM      Weston Anna, MA, CCC-SLP                Recent Labs    02/19/21 0545  WBC 8.2  HGB 14.6  HCT 42.1  PLT 259    Recent Labs    02/19/21 0545 02/20/21 0512  NA 138 140  K 4.2 4.0  CL 101 104  CO2 29 27  GLUCOSE 93 85  BUN 25* 21  CREATININE 1.19 1.12  CALCIUM 8.9 8.8*     Intake/Output Summary (Last 24 hours) at 02/21/2021 0832 Last data filed at 02/21/2021 0752 Gross per 24 hour  Intake 1080 ml  Output 800 ml  Net 280 ml        Physical Exam: Vital Signs Blood pressure 132/84, pulse 63, temperature 97.6 F (36.4 C), resp. rate 16, height 5\' 8"  (1.727 m), weight 62.1 kg, SpO2 96 %.    Constitutional: No distress . Vital signs reviewed. HEENT: NCAT, EOMI, oral membranes moist Neck: supple Cardiovascular: RRR without murmur. No JVD    Respiratory/Chest: CTA Bilaterally without wheezes or rales. Normal effort    GI/Abdomen: BS +, non-tender, non-distended Ext: no clubbing, cyanosis, or edema Psych: pleasant and cooperative  Skin: facial lesion present (chronic) Neuro:  alert, mild dysarthria. Better phonation  Left central 7 and tongue deviation. LUE 2-3/5  LLE 4/5 prox to distal. Decreased LT left arm>leg.  Musculoskeletal: Full ROM, No pain with AROM or PROM in the neck, trunk, or extremities. Posture appropriate     Assessment/Plan: 1. Functional deficits which require 3+ hours per day of interdisciplinary therapy in a comprehensive inpatient rehab setting. Physiatrist is providing close team supervision and 24 hour management of active medical problems listed below. Physiatrist and rehab  team continue to assess barriers to discharge/monitor patient progress toward functional and medical goals  Care  Tool:  Bathing    Body parts bathed by patient: Left arm, Chest, Abdomen, Front perineal area, Right upper leg, Left upper leg, Face, Right arm, Buttocks, Left lower leg, Right lower leg   Body parts bathed by helper: Right arm, Buttocks, Right lower leg, Left lower leg     Bathing assist Assist Level: Set up assist     Upper Body Dressing/Undressing Upper body dressing   What is the patient wearing?: Pull over shirt    Upper body assist Assist Level: Set up assist    Lower Body Dressing/Undressing Lower body dressing      What is the patient wearing?: Incontinence brief, Pants     Lower body assist Assist for lower body dressing: Supervision/Verbal cueing     Toileting Toileting Toileting Activity did not occur (Clothing management and hygiene only): N/A (no void or bm)  Toileting assist Assist for toileting: Supervision/Verbal cueing     Transfers Chair/bed transfer  Transfers assist     Chair/bed transfer assist level: Independent     Locomotion Ambulation   Ambulation assist      Assist level: Supervision/Verbal cueing Assistive device: No Device Max distance: 909 ft   Walk 10 feet activity   Assist     Assist level: Supervision/Verbal cueing Assistive device: No Device   Walk 50 feet activity   Assist Walk 50 feet with 2 turns activity did not occur: Safety/medical concerns  Assist level: Supervision/Verbal cueing Assistive device: No Device    Walk 150 feet activity   Assist Walk 150 feet activity did not occur: Safety/medical concerns  Assist level: Supervision/Verbal cueing Assistive device: No Device    Walk 10 feet on uneven surface  activity   Assist Walk 10 feet on uneven surfaces activity did not occur: Safety/medical concerns   Assist level: Supervision/Verbal cueing Assistive device: Other (comment) (none)   Wheelchair     Assist Is the patient using a wheelchair?: No Type of Wheelchair:  Manual Wheelchair activity did not occur: N/A  Wheelchair assist level: Minimal Assistance - Patient > 75% Max wheelchair distance: 52 ft    Wheelchair 50 feet with 2 turns activity    Assist    Wheelchair 50 feet with 2 turns activity did not occur: N/A   Assist Level: Minimal Assistance - Patient > 75%   Wheelchair 150 feet activity     Assist  Wheelchair 150 feet activity did not occur: N/A   Assist Level: Total Assistance - Patient < 25%   Blood pressure 132/84, pulse 63, temperature 97.6 F (36.4 C), resp. rate 16, height 5\' 8"  (1.727 m), weight 62.1 kg, SpO2 96 %.  Medical Problem List and Plan: 1.  Left-sided weakness with dysarthria functional deficits secondary to right lacunar basal ganglia infarct             dc home today  -f/u with pcp, [facility], derm referral for facial lesion, ?GI referral at some point 2.  Antithrombotics: -DVT/anticoagulation:  Pharmaceutical: Lovenox             -antiplatelet therapy: Aspirin 81 mg daily and Plavix 75 mg daily x3 weeks and aspirin alone 3. Pain Management: Tylenol as needed 4. Mood/sleep-wake:  less alert  -decreased amantadine to 100mg  daily--can continue for 1-2 weeks then stop             -antipsychotic agents: N/A 5. Neuropsych: This patient is  capable of making decisions on his own behalf. 6. Skin/Wound Care: Routine skin checks 7. Fluids/Electrolytes/Nutrition:   - eating well  -1/11 will need to continue pushing fluids at home 8.  Dysphagia.  pt with apparent chronic cough, tolerated purees and nectars with SLP---> now on D2/nectar diet. MBS reviewed, continue per SLP recs given aspiration of thins   - He's doing better with comp strategies. Less cough!  ?possible GI referral as OP depending upon his progress here   9.  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  continue Vimpat 10.  History of alcohol tobacco polysubstance use.  Urine drug screen positive cocaine as well as marijuana.  Provide counseling as  appropriate 11.  Hyperlipidemia.  continue Lipitor 12. Facial lesion: outpatient referral to dermatology at d/c 13. Constipation  Moving bowels    LOS: 15 days A FACE TO FACE EVALUATION WAS PERFORMED  Meredith Staggers 02/21/2021, 8:32 AM

## 2021-02-22 DIAGNOSIS — I639 Cerebral infarction, unspecified: Secondary | ICD-10-CM | POA: Diagnosis not present

## 2021-02-23 ENCOUNTER — Telehealth: Payer: Self-pay

## 2021-02-23 NOTE — Telephone Encounter (Signed)
Transitional Care call-- [name] (friend)    Are you/is patient experiencing any problems since coming home? Are there any questions regarding any aspect of care? Caregiver [name] states that the patient is being stubborn, but no other problems. Are there any questions regarding medications administration/dosing? Patient was advised by Linna Hoff to get Miralax OTC, but they did not. He does have stool softeners, but caregiver does not want to give to him because he was not informed to give to him. Patient is supposed to be putting thickener in drinks, but is refusing to do so. Caregiver is wondering if it is necessary because he was informed that insurance will not cover it. He states the patient has been drinking drinks without chocking. Are meds being taken as prescribed? Yes Patient should review meds with caller to confirm Have there been any falls? No Has Home Health been to the house and/or have they contacted you? If not, have you tried to contact them? Can we help you contact them? Patient is doing outpatient for PT, OT and SLP with [facility] Are bowels and bladder emptying properly? Yes Are there any unexpected incontinence issues? No If applicable, is patient following bowel/bladder programs? N/A Any fevers, problems with breathing, unexpected pain? No Are there any skin problems or new areas of breakdown? No Has the patient/family member arranged specialty MD follow up (ie cardiology/neurology/renal/surgical/etc)?  Patient has scheduled appointment with PCP. Can we help arrange? N/A Does the patient need any other services or support that we can help arrange? No Are caregivers following through as expected in assisting the patient? Yes Has the patient quit smoking, drinking alcohol, or using drugs as recommended? Yes  Appointment time, arrive time and who it is with here 03/07/21 at 1:40 arrival time 1:20 with Danella Sensing, NP [place]

## 2021-02-23 NOTE — Progress Notes (Signed)
Inpatient Rehabilitation Discharge Medication Review by a Pharmacist  A complete drug regimen review was completed for this patient to identify any potential clinically significant medication issues.  High Risk Drug Classes Is patient taking? Indication by Medication  Antipsychotic No   Anticoagulant No   Antibiotic No   Opioid No   Antiplatelet Yes ASA + Plavix (through 1/15) for CVA prophx.  Hypoglycemics/insulin No   Vasoactive Medication Yes Norvasc for HTN  Chemotherapy No   Other Yes Vimpat for seizure DO    Clinically significant medication issues were identified that warrant physician communication and completion of prescribed/recommended actions by midnight of the next day:  No Time spent performing this drug regimen review (minutes):  59min.  Evert Wenrich S. Alford Highland, PharmD, BCPS Clinical Staff Pharmacist [email or web address]  Wayland Salinas 02/21/2021 7:27 AM

## 2021-02-28 ENCOUNTER — Ambulatory Visit: Payer: Medicare HMO | Admitting: Occupational Therapy

## 2021-02-28 ENCOUNTER — Ambulatory Visit: Payer: Self-pay | Admitting: Physical Therapy

## 2021-02-28 ENCOUNTER — Ambulatory Visit: Payer: Medicare HMO

## 2021-02-28 ENCOUNTER — Other Ambulatory Visit: Payer: Self-pay

## 2021-02-28 ENCOUNTER — Ambulatory Visit: Payer: Medicare HMO | Attending: Physician Assistant | Admitting: Physical Therapy

## 2021-02-28 ENCOUNTER — Encounter: Payer: Self-pay | Admitting: Occupational Therapy

## 2021-02-28 DIAGNOSIS — R41841 Cognitive communication deficit: Secondary | ICD-10-CM | POA: Diagnosis present

## 2021-02-28 DIAGNOSIS — R278 Other lack of coordination: Secondary | ICD-10-CM

## 2021-02-28 DIAGNOSIS — I69354 Hemiplegia and hemiparesis following cerebral infarction affecting left non-dominant side: Secondary | ICD-10-CM | POA: Diagnosis present

## 2021-02-28 DIAGNOSIS — I639 Cerebral infarction, unspecified: Secondary | ICD-10-CM | POA: Diagnosis present

## 2021-02-28 DIAGNOSIS — R41842 Visuospatial deficit: Secondary | ICD-10-CM

## 2021-02-28 DIAGNOSIS — R2689 Other abnormalities of gait and mobility: Secondary | ICD-10-CM | POA: Diagnosis present

## 2021-02-28 DIAGNOSIS — R41840 Attention and concentration deficit: Secondary | ICD-10-CM | POA: Diagnosis present

## 2021-02-28 DIAGNOSIS — R2681 Unsteadiness on feet: Secondary | ICD-10-CM

## 2021-02-28 DIAGNOSIS — M6281 Muscle weakness (generalized): Secondary | ICD-10-CM | POA: Diagnosis present

## 2021-02-28 DIAGNOSIS — R262 Difficulty in walking, not elsewhere classified: Secondary | ICD-10-CM | POA: Diagnosis present

## 2021-02-28 DIAGNOSIS — R1312 Dysphagia, oropharyngeal phase: Secondary | ICD-10-CM | POA: Diagnosis present

## 2021-02-28 NOTE — Therapy (Signed)
[facility] [address] [place], [place], [address] Phone: [phone]   Fax:  [phone]  Physical Therapy Evaluation  Patient Details  Name: Douglas Spencer MRN: [id number] Date of Birth: [birth year] Referring Provider (PT): Cathlyn Parsons, PA-C   Encounter Date: 02/28/2021   PT End of Session - 02/28/21 1359     Visit Number 1    Number of Visits 16    Date for PT Re-Evaluation 04/25/21    Authorization Type [employer]    Progress Note Due on Visit 10    PT Start Time 1359    PT Stop Time 1445    PT Time Calculation (min) 46 min    Equipment Utilized During Treatment Gait belt    Activity Tolerance Patient tolerated treatment well    Behavior During Therapy WFL for tasks assessed/performed             Past Medical History:  Diagnosis Date   ETOH abuse    Substance abuse ([place])     No past surgical history on file.  There were no vitals filed for this visit.    Subjective Assessment - 02/28/21 1403     Subjective Pt reports having CVA Jan 30, 2021. Pt reports left hand and leg were affected. Caregiver states that cognitively he also seems to have changed. Caregiver notes that they also incidentally found multiple chronic "mini strokes" on imaging. Caregiver states pt has been doing "Little projects" since returning home but has not much assistance. Pt has not been cooking (caregiver was doing it before the CVA). Caregiver states pt is more uneasy going out. Pt is normally able to walk to the store which is ~10 minutes away. Caregiver has been taking care of pt since before the CVA and stays with him 24 hours/day for the most part. Caregiver is not sure how much of pt's stability and balance is affected by his medication vs the CVA -- he notes that in general it is worse than prior to the CVA.    Limitations Walking;Lifting    How long can you sit comfortably? n/a    How long can you stand  comfortably? n/a    How long can you walk comfortably? Has not tried going to grocery store    Patient Stated Goals Regain all of his strength and be able to walk outside to the grocery store again    Currently in Pain? No/denies                [facility] PT Assessment - 02/28/21 0001       Assessment   Medical Diagnosis I63.9 (ICD-10-CM) - Cerebral infarction, unspecified    Referring Provider (PT) Cathlyn Parsons, PA-C    Hand Dominance Right    Prior Therapy PT in the hospital x 2 weeks      Precautions   Precautions Fall      Restrictions   Weight Bearing Restrictions No      Balance Screen   Has the patient fallen in the past 6 months Yes    How many times? 1   when he had stroke   Has the patient had a decrease in activity level because of a fear of falling?  No    Is the patient reluctant to leave their home because of a fear of falling?  No      Home Ecologist residence    Living Arrangements --  Caregiver   Available Help at Discharge Personal care attendant;Available 24 hours/day    Type of Home House    Home Access Stairs to enter    Entrance Stairs-Number of Steps 4    Entrance Stairs-Rails Right    Home Layout One level      Prior Function   Level of Independence Independent    Leisure "tinker around"      Cognition   Overall Cognitive Status Within Functional Limits for tasks assessed      Observation/Other Assessments   Focus on Therapeutic Outcomes (FOTO)  65 (risk adjusted 58); predicted 75 in 16 visits      Sensation   Light Touch Appears Intact      Tone   Assessment Location Left Lower Extremity      ROM / Strength   AROM / PROM / Strength Strength      Strength   Strength Assessment Site Hip;Knee;Ankle    Right/Left Hip Right;Left    Right Hip Flexion 4/5    Right Hip Extension 4/5    Right Hip ABduction 3+/5    Left Hip Flexion 3+/5    Left Hip Extension 4-/5    Left Hip ABduction 3/5     Right/Left Knee Right;Left    Right Knee Flexion 4/5    Right Knee Extension 4/5    Left Knee Flexion 4-/5    Left Knee Extension 4-/5    Right/Left Ankle Right;Left    Right Ankle Dorsiflexion 4/5    Right Ankle Plantar Flexion 4+/5    Left Ankle Dorsiflexion 3+/5    Left Ankle Plantar Flexion 4/5      Transfers   Five time sit to stand comments  18.29   1 LOB to the left on initial standing     Ambulation/Gait   Ambulation Distance (Feet) 115 Feet    Assistive device None    Gait Pattern Step-through pattern;Decreased dorsiflexion - left;Poor foot clearance - left;Decreased weight shift to left    Ambulation Surface Level;Indoor    Gait velocity 0.48 m/s      Standardized Balance Assessment   Standardized Balance Assessment Berg Balance Test      Berg Balance Test   Sit to Stand Able to stand without using hands and stabilize independently    Standing Unsupported Able to stand safely 2 minutes    Sitting with Back Unsupported but Feet Supported on Floor or Stool Able to sit safely and securely 2 minutes    Stand to Sit Controls descent by using hands    Transfers Able to transfer safely, definite need of hands    Standing Unsupported with Eyes Closed Able to stand 10 seconds safely    Standing Unsupported with Feet Together Able to place feet together independently and stand for 1 minute with supervision    From Standing, Reach Forward with Outstretched Arm Can reach forward >12 cm safely (5")    From Standing Position, Pick up Object from Floor Able to pick up shoe, needs supervision    From Standing Position, Turn to Look Behind Over each Shoulder Looks behind one side only/other side shows less weight shift   "I feel like I'm falling to the left"   Turn 360 Degrees Able to turn 360 degrees safely but slowly    Standing Unsupported, Alternately Place Feet on Step/Stool Able to stand independently and complete 8 steps >20 seconds    Standing Unsupported, One Foot in Front Able  to take small step  independently and hold 30 seconds    Standing on One Leg Tries to lift leg/unable to hold 3 seconds but remains standing independently    Total Score 42    Berg comment: 42/56      Functional Gait  Assessment   Gait assessed  Yes    Gait Level Surface Walks 20 ft, slow speed, abnormal gait pattern, evidence for imbalance or deviates 10-15 in outside of the 12 in walkway width. Requires more than 7 sec to ambulate 20 ft.    Change in Gait Speed Able to smoothly change walking speed without loss of balance or gait deviation. Deviate no more than 6 in outside of the 12 in walkway width.    Gait with Horizontal Head Turns Performs head turns smoothly with slight change in gait velocity (eg, minor disruption to smooth gait path), deviates 6-10 in outside 12 in walkway width, or uses an assistive device.    Gait with Vertical Head Turns Performs task with slight change in gait velocity (eg, minor disruption to smooth gait path), deviates 6 - 10 in outside 12 in walkway width or uses assistive device    Gait and Pivot Turn Pivot turns safely in greater than 3 sec and stops with no loss of balance, or pivot turns safely within 3 sec and stops with mild imbalance, requires small steps to catch balance.    Step Over Obstacle Is able to step over one shoe box (4.5 in total height) without changing gait speed. No evidence of imbalance.    Gait with Narrow Base of Support Ambulates 7-9 steps.    Gait with Eyes Closed Walks 20 ft, slow speed, abnormal gait pattern, evidence for imbalance, deviates 10-15 in outside 12 in walkway width. Requires more than 9 sec to ambulate 20 ft.    Ambulating Backwards Walks 20 ft, slow speed, abnormal gait pattern, evidence for imbalance, deviates 10-15 in outside 12 in walkway width.    Steps Alternating feet, must use rail.    Total Score 18    FGA comment: 18/30      LLE Tone   LLE Tone Modified Ashworth      LLE Tone   Modified Ashworth Scale for  Grading Hypertonia LLE Slight increase in muscle tone, manifested by a catch and release or by minimal resistance at the end of the range of motion when the affected part(s) is moved in flexion or extension                        Objective measurements completed on examination: See above findings.                PT Education - 02/28/21 1452     Education Details Exam findings, POC and HEP. Discussed normal gait and importance of practicing at home.    Person(s) Educated Patient;Caregiver(s)    Methods Explanation;Demonstration;Tactile cues;Verbal cues    Comprehension Verbalized understanding;Returned demonstration;Verbal cues required;Tactile cues required              PT Short Term Goals - 02/28/21 1511       PT SHORT TERM GOAL #1   Title Pt will be able to perfrom HEP with SBA    Time 4    Period Weeks    Status New    Target Date 03/28/21      PT SHORT TERM GOAL #2   Title Pt will demo improved 5x STS to </=15 sec  Baseline 18 sec    Time 4    Period Weeks    Status New    Target Date 03/28/21      PT SHORT TERM GOAL #3   Title Pt will demo improved gait speed to at least 0.6 m/s for improved limited community amb    Baseline 0.4 m/s    Time 4    Period Weeks    Status New    Target Date 03/28/21      PT SHORT TERM GOAL #4   Title Pt will have improved Berg Balance Score to at least 45/56 to demo decrased fall risk    Baseline 43/56    Time 4    Period Weeks    Status New    Target Date 03/28/21               PT Long Term Goals - 02/28/21 1513       PT LONG TERM GOAL #1   Title Pt will be independent with maintaining exercises at home    Time 8    Period Weeks    Status New    Target Date 04/25/21      PT LONG TERM GOAL #2   Title Pt will be able to perform 5x STS in </=12 sec to demo increased functional LE strength    Time 8    Period Weeks    Status New    Target Date 04/25/21      PT LONG TERM GOAL #3    Title Pt will have improved FGA score to at least 26/30 to demo decreased fall risk    Baseline 18/30    Time 8    Period Weeks    Status New    Target Date 04/25/21      PT LONG TERM GOAL #4   Title Pt will be able to amb to the grocery store (~10 min walk outside) with SBA to demo return to PLOF    Baseline Has not attempted    Time 8    Period Weeks    Status New    Target Date 04/25/21      PT LONG TERM GOAL #5   Title Pt will have improved FOTO score to at least 75    Baseline 65    Time 8    Period Weeks    Status New    Target Date 04/25/21                    Plan - 02/28/21 1453     Clinical Impression Statement Mr. 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 Pt with history of alcohol as well as polysubstance abuse, seizure disorder noncompliant with Keppra. Pt lives with his caregiver. On assessment, pt demos L>R LE weakness, decreased balance, gait instability, and decreased confidence in his overall mobility affecting safe community ambulation. Pt would benefit from PT to improve on these issues and return to PLOF.    Personal Factors and Comorbidities Age;Time since onset of injury/illness/exacerbation;Past/Current Experience    Examination-Activity Limitations Locomotion Level;Squat;Transfers    Examination-Participation Restrictions Community Activity;Yard Work    Merchant navy officer Evolving/Moderate complexity    Clinical Decision Making Moderate    Rehab Potential Good    PT Frequency 2x / week    PT Duration 8 weeks    PT Treatment/Interventions ADLs/Self Care Home Management;Aquatic Therapy;DME Instruction;Gait training;Stair training;Functional  mobility training;Therapeutic activities;Therapeutic exercise;Balance training;Neuromuscular re-education;Manual techniques;Patient/family education;Dry needling;Orthotic Fit/Training;Taping;Vestibular    PT Next Visit Plan Has pt been practicing  heel strike/arm swing during gait? Initiate strengthening and balance HEP. Work on narrow BOS, compliant surfaces, L LE stability.    PT Home Exercise Plan Pt to practice heel strike and arm swing    Recommended Other Services Getting OT and ST as well    Consulted and Agree with Plan of Care Patient;Family member/caregiver    Family Member Consulted Caregiver, [name]             Patient will benefit from skilled therapeutic intervention in order to improve the following deficits and impairments:  Abnormal gait, Difficulty walking, Impaired UE functional use, Decreased endurance, Decreased activity tolerance, Decreased balance, Decreased mobility, Decreased strength  Visit Diagnosis: Infarction of right basal ganglia (HCC)  Muscle weakness (generalized)  Difficulty in walking, not elsewhere classified  Other abnormalities of gait and mobility  Unsteadiness on feet     Problem List Patient Active Problem List   Diagnosis Date Noted   Protein-calorie malnutrition, severe 02/06/2021   Infarction of right basal ganglia ([place]) 02/06/2021   Stroke determined by clinical assessment [facility]) 01/30/2021    Tywanda Rice April Gordy Levan, PT, DPT 02/28/2021, 3:17 PM  [facility] [address] [place], [place], [address] Phone: [phone]   Fax:  [phone]  Name: Douglas Spencer MRN: [id number] Date of Birth: Nov 10, 1945

## 2021-02-28 NOTE — Therapy (Signed)
[facility] [address] [place], [place], [address] Phone: [phone]   Fax:  [phone]  Occupational Therapy Evaluation  Patient Details  Name: Douglas Spencer MRN: [id number] Date of Birth: 1945-11-07 Referring Provider (OT): Dr Naaman Plummer   Encounter Date: 02/28/2021   OT End of Session - 02/28/21 1631     Visit Number 1    Number of Visits 17    Authorization Type TBD    Progress Note Due on Visit 10    OT Start Time 8250    OT Stop Time 1530    OT Time Calculation (min) 45 min    Behavior During Therapy Flat affect             Past Medical History:  Diagnosis Date   ETOH abuse    Substance abuse ([place])     History reviewed. No pertinent surgical history.  There were no vitals filed for this visit.   Subjective Assessment - 02/28/21 1452     Subjective  I don't read or do crossword puzzles like I used to.    Patient is accompanied by: --   friend   Currently in Pain? 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      ADL   Eating/Feeding Modified independent   has some swallowing problems - longstanding problem, also ill fitting dentures   Grooming Modified independent    Upper Body Bathing Modified independent    Lower Body Bathing Modified independent    Upper Body Dressing Increased time;Needs assist for fasteners    Lower Body Dressing Needs assist for fasteners;Increased time;Modified independent    Toilet Transfer  Modified independent    Toileting - Clothing Manipulation Modified independent    Toileting -  Hygiene Modified Independent    Tub/Shower Transfer Modified independent    ADL comments Difficulty with bed mobility      IADL   Prior Level of Function Shopping Independent    Shopping Needs to be accompanied on any shopping trip    Prior Level of Function Community Mobility did not drive    Prior Level of Function Medication Managment did not take medication    Medication Management Takes responsibility if medication is prepared in advance in seperate dosage      Written Expression   Dominant Hand Right      Vision - History   Baseline Vision Wears glasses only for reading    Visual History --    Additional Comments reports no change      Vision Assessment   Eye Alignment Impaired (comment)    Ocular Range of Motion Restricted on left   and looking up   Alignment/Gaze Preference Gaze Right    Tracking/Visual Pursuits Requires cues, head turns, or add eye shifts to track  Comment Decreased ability to maintain gaze right and superior right      Activity Tolerance   Activity Tolerance Comments Decreased since stroke      Cognition   Overall Cognitive Status Impaired/Different from baseline    Attention Focused    Awareness Impaired    Awareness Impairment Intellectual impairment    Cognition Comments Delayed processing, attends for seconds - limited eye contact on left.  Reports decreased interest in reading, crossword puzzles which he used to love to do.      Observation/Other Assessments   Focus on Therapeutic Outcomes (FOTO)  65 (risk adjusted 58);      Posture/Postural Control   Posture/Postural Control Postural limitations    Postural Limitations Posterior pelvic tilt;Flexed trunk      Sensation   Light Touch Appears Intact    Stereognosis Not tested      Coordination   Gross Motor Movements are Fluid and Coordinated No    Fine Motor Movements are Fluid and  Coordinated No    9 Hole Peg Test Right;Left    Right 9 Hole Peg Test 33.47    Left 9 Hole Peg Test --   unable   Box and Blocks Left-7      Perception   Perception Impaired    Inattention/Neglect Does not attend to left visual field      Praxis   Praxis Intact      Tone   Assessment Location Left Upper Extremity      ROM / Strength   AROM / PROM / Strength AROM;Strength      AROM   Overall AROM  Deficits    Overall AROM Comments shoulder to 75*flex, elbow - lacks 25*extension      Strength   Overall Strength Deficits    Overall Strength Comments NT LUE - Hypertonic, lacks full range      Hand Function   Right Hand Gross Grasp Functional    Right Hand Grip (lbs) 52.4    Right Hand Lateral Pinch 14 lbs    Left Hand Gross Grasp Impaired    Left Hand Grip (lbs) 8.5    Left Hand Lateral Pinch 3 lbs      LUE Tone   LUE Tone Hypertonic      LUE Tone   Hypertonic Details elbow flexors                              OT Education - 02/28/21 1630     Education Details potential goasl and OT evaluation findings    Person(s) Educated Patient;Caregiver(s)    Methods Explanation    Comprehension Need further instruction              OT Short Term Goals - 02/28/21 1645       OT SHORT TERM GOAL #1   Title Patient will complete an HEP designed to improve range of motion in LUE    Time 4    Period Weeks    Status New    Target Date 04/14/21      OT SHORT TERM GOAL #2   Title Patient will dress his upper body with increased time and min assistance using BUE - includes buttons/zippers    Time 4    Period Weeks    Status New      OT SHORT TERM GOAL #3   Title Patient will tie shoes with increased time using BUE  Time 4    Period Weeks    Status New      OT SHORT TERM GOAL #4   Title Patient will demonstrate 3 lb increase in grip strength in LUE    Time 4    Period Weeks    Status New               OT Long Term Goals - 02/28/21  1647       OT LONG TERM GOAL #1   Title Patient will complete updated HEP designed to improve functional movement, strength and coordiantion in LUE    Time 8    Period Weeks    Status New    Target Date 05/14/21      OT LONG TERM GOAL #2   Title Patient will complete Box and Blocks - at last 20 blocks - LUE    Baseline 7    Time 8    Period Weeks    Status New      OT LONG TERM GOAL #3   Title Patient will demonstrate 10 lb increase in grip strength, and 3 lb increase in lateral pinch strength with LUE    Time 8    Period Weeks    Status New      OT LONG TERM GOAL #4   Title Patient will dress himself - includes fasteners, in reasonable amount ot itme, and using BUE    Time 8    Period Weeks    Status New      OT LONG TERM GOAL #5   Title Patient will visually scan on page to read full paragraph with environmental cueing    Time 8    Period Weeks    Status New                   Plan - 02/28/21 1632     Clinical Impression Statement Patient is a 76 yr old man referred to OT following acute right basal ganglia infarct on 12/20, several subacute strokes also indicated on imaging.    Patient received intense inpatient rehab from 12/27-1/11/23.  Patient presents to OT eval with right gaze preference, decreased sustained attention, left hemiplegia, decreased balance, decreased coordination all limiting his ability to independently complete ADL/IADL.  Patient with a PMH significant for seizure disorder - with documented non-compliance with medication, and polysubstance use. Patient will benefit from skilled OT intervention to increase patient's ability to return to more independent performance of  ADL/IADL.    OT Occupational Profile and History Detailed Assessment- Review of Records and additional review of physical, cognitive, psychosocial history related to current functional performance    Occupational performance deficits (Please refer to evaluation for details):  ADL's;IADL's;Leisure    Body Structure / Function / Physical Skills ADL;Decreased knowledge of use of DME;Strength;Balance;Dexterity;GMC;Tone;Body mechanics;Proprioception;UE functional use;Endurance;IADL;ROM;Vestibular;Vision;Coordination;Flexibility;Mobility;Decreased knowledge of precautions;FMC    Cognitive Skills Attention;Problem Solve;Safety Awareness;Sequencing;Temperament/Personality;Learn;Thought;Memory;Understand;Orientation;Perception    Psychosocial Skills Coping Strategies    Rehab Potential Fair    Clinical Decision Making Several treatment options, min-mod task modification necessary    Comorbidities Affecting Occupational Performance: May have comorbidities impacting occupational performance    Modification or Assistance to Complete Evaluation  Min-Moderate modification of tasks or assist with assess necessary to complete eval    OT Frequency 2x / week    OT Duration 8 weeks    OT Treatment/Interventions Self-care/ADL training;Moist Heat;Fluidtherapy;DME and/or AE instruction;Splinting;Balance training;Aquatic Therapy;Therapeutic activities;Ultrasound;Therapeutic exercise;Cognitive remediation/compensation;Coping strategies training;Visual/perceptual remediation/compensation;Passive range of motion;Functional Mobility Training;Neuromuscular education;Electrical  Stimulation;Manual Therapy;Patient/family education    Plan NMR LUE, Needs dynamic balance training and functional visual scanning to left - improve attention from seconds to minutes    Consulted and Agree with Plan of Care Patient;Family member/caregiver    Family Member Consulted Caregiver [name]             Patient will benefit from skilled therapeutic intervention in order to improve the following deficits and impairments:   Body Structure / Function / Physical Skills: ADL, Decreased knowledge of use of DME, Strength, Balance, Dexterity, GMC, Tone, Body mechanics, Proprioception, UE functional use, Endurance, IADL,  ROM, Vestibular, Vision, Coordination, Flexibility, Mobility, Decreased knowledge of precautions, [facility] Cognitive Skills: Attention, Problem Solve, Safety Awareness, Sequencing, Temperament/Personality, Learn, Thought, Memory, Understand, Orientation, Perception Psychosocial Skills: Coping Strategies   Visit Diagnosis: Hemiplegia and hemiparesis following cerebral infarction affecting left non-dominant side ([place]) - Plan: Ot plan of care cert/re-cert  Visuospatial deficit - Plan: Ot plan of care cert/re-cert  Other lack of coordination - Plan: Ot plan of care cert/re-cert  Muscle weakness (generalized) - Plan: Ot plan of care cert/re-cert  Unsteadiness on feet - Plan: Ot plan of care cert/re-cert  Attention and concentration deficit - Plan: Ot plan of care cert/re-cert    Problem List Patient Active Problem List   Diagnosis Date Noted   Protein-calorie malnutrition, severe 02/06/2021   Infarction of right basal ganglia ([place]) 02/06/2021   Stroke determined by clinical assessment [facility]) 01/30/2021    Mariah Milling, OT 02/28/2021, 4:54 PM  [facility] [address] [place], [place], [address] Phone: [phone]   Fax:  [phone]  Name: Kaelum Kissick MRN: [id number] Date of Birth: Jun 15, 1945

## 2021-02-28 NOTE — Addendum Note (Signed)
Addended by: Colbert Ewing MARIE L on: 02/28/2021 03:18 PM   Modules accepted: Orders

## 2021-03-01 NOTE — Therapy (Signed)
[facility] [address] [place], [place], [address] Phone: [phone]   Fax:  [phone]  Speech Language Pathology Evaluation  Patient Details  Name: Douglas Spencer MRN: [id number] Date of Birth: Jul 03, 1945 Referring Provider (SLP): Cathlyn Parsons, PA-C   Encounter Date: 02/28/2021   End of Session - 02/28/21 1633     Visit Number 1    Number of Visits 25    Date for SLP Re-Evaluation 05/25/21    Authorization Type [employer]    SLP Start Time 2263    SLP Stop Time  3354    SLP Time Calculation (min) 43 min    Activity Tolerance Patient tolerated treatment well             Past Medical History:  Diagnosis Date   ETOH abuse    Substance abuse ([place])     No past surgical history on file.  There were no vitals filed for this visit.       SLP Evaluation [facility] - 02/28/21 1542       SLP Visit Information   SLP Received On 02/19/21    Referring Provider (SLP) Cathlyn Parsons, PA-C    Onset Date 01-30-22    Medical Diagnosis Cerebral infarction      Subjective   Patient/Family Stated Goal to return to baseline      Pain Assessment   Currently in Pain? No/denies      General Information   HPI Patient is a 76 y.o. year old right-handed male with history of alcohol use/polysubstance and tobacco use as well as seizure disorder noncompliant with Keppra.  Per chart review lives with a friend.  Two-level home one-step to entry.  Reportedly independent prior to admission.  Presented 01/30/2021 with acute onset of left side weakness as well as dysarthric speech.  Cranial CT scan negative for acute changes.  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  CT angiogram of head and neck no large vessel occlusion.  MRI showed acute infarct right lenticulostriate territory.  Moderately large territory involved.  No intracranial hemorrhage.   Neurology follow-up patient consented to [facility].  Echocardiogram with ejection fraction of 60 to 65% no wall motion abnormalities.  Admission chemistries were unremarkable except sodium 133, urine drug screen positive for marijuana as well as cocaine.      Balance Screen   Has the patient fallen in the past 6 months No    Has the patient had a decrease in activity level because of a fear of falling?  No    Is the patient reluctant to leave their home because of a fear of falling?  No      Prior Functional Status   Cognitive/Linguistic Baseline Within functional limits   SLP questions prior baseline as pt endorsed some long term difficulty with attention and processing   Type of [place] With Family    Available Support Family    Education college    Vocation Retired   Geophysicist/field seismologist, Community education officer     Cognition   Overall Cognitive Status Impaired/Different from baseline    Area of Impairment Attention;Awareness;Problem solving    Current Attention Level Focused    Awareness Intellectual;Emergent;Anticipatory    Problem Solving Slow processing;Decreased initiation;Difficulty sequencing      Auditory Comprehension   Overall Auditory Comprehension Appears within functional limits for tasks assessed      Verbal Expression  Overall Verbal Expression Appears within functional limits for tasks assessed      Oral Motor/Sensory Function   Overall Oral Motor/Sensory Function Impaired    Lingual Strength Reduced    Lingual Coordination Reduced    Facial Coordination Reduced      Motor Speech   Overall Motor Speech Appears within functional limits for tasks assessed   Caregiver indicated only concern as improper fitting dentures     Standardized Assessments   Standardized Assessments  Other Assessment   SLUMS = 22/30 (compared to 9/30 on 12/22)                            SLP Education - 02/28/21 1636     Education Details eval results, diet  recommendations, MBSS results, SLUMS results    Person(s) Educated Patient;Caregiver(s)    Methods Explanation    Comprehension Verbalized understanding;Need further instruction              SLP Short Term Goals - 03/01/21 1157       SLP SHORT TERM GOAL #1   Title Pt will verbalize and demonstrate recommended swallow exercises with occasional min A over 2 sessions    Time 4    Period Weeks    Status New      SLP SHORT TERM GOAL #2   Title Pt will complete dysphagia HEP as recommended for 4/5 days with occasional min A from caregiver    Time 4    Period Weeks    Status New      SLP SHORT TERM GOAL #3   Title Caregiver will assist with carryover of swallow exercise HEP and compensatory swallow strategies to reduce risk of aspiration given rare min A over 2 sessions    Time 4    Period Weeks    Status New      SLP SHORT TERM GOAL #4   Title Pt will identify errors and correct problems in functional and structured scenarios given occasional min A over 2 sessions    Time 4    Period Weeks    Status New      SLP SHORT TERM GOAL #5   Title Pt will implement compensations to aid processing speed on structured and unstructured tasks with 80% accuracy over 2 sessions    Time 4    Period Weeks    Status New              SLP Long Term Goals - 03/01/21 1204       SLP LONG TERM GOAL #1   Title Pt and caregiver will implement recommended compensatory strategies to reduce risk of aspiration given rare min A over 2 sessions    Time 8    Period Weeks    Status New      SLP LONG TERM GOAL #2   Title Pt will participate in objective swallow study (as agreeable) to re-evaluate oropharyngeal dysphagia    Time 8    Period Weeks    Status New      SLP LONG TERM GOAL #3   Title Pt will demonstrate awareness of deficits and modify functional scenarios to ensure accurate and safe completion given rare min A over 2 sessions    Time 12    Period Weeks    Status New      SLP  LONG TERM GOAL #4   Title Pt will demonstrate improved sustained attention and auditory processing  with increased participation in [place] given rare min A over 2 sessions    Time [place] - 02/28/21 1634     Clinical Impression Statement "Douglas Spencer" was referred for OPST secondary to cerebral infarction on 01-30-21. PMHX significant for polystubstance abuse, seizure disorder (documented non-compliance on Keppra), and reported hx of CVAs per caregiver. Pt was accompanied by long term friend/caregiver, [name]. [name] provided majority of hx and commentary with intermittent contributions provided by patient when directly asked.  Extensive hx of swallowing disorder reported secondary to esophageal component which was never addressed. Pt is supposed to follow up with GI but this is not yet scheduled. [name] believed once GI and new dentures (upper and lower) completed, swallow function will continue to improve. MBSS on 02-20-21 revealed moderate oropharyngeal dysphagia with recommendation for Dysphagia 2 and nectar-thick liquids. [name] endorsed they are no longer using the thickener or blending the food due to pt dislike of recommended textures and adequate tolerance exhibited with self-upgraded textures. SLP re-educated rationale for these recommendations to optimize safety during swallowing, with deficits reviewed per most recent MBSS. SLP educated s/sx of aspiration to monitor for as pt/caregiver have elected not to abide by MBSS recommendations. [name] stated "we are being safe. He's still doing the sip and swallow." Swallow exercises reportedly provided during hospitalization but no carryover at home indicated. Pt expressed reduced motivation to resume swallow exercises, with SLP again providing education re: recommmendations to target oral and pharyngeal deficits to optimize swallow function and reduce risk of aspiration. Pt agreeable to target swallow  exercises. Informally, pt exhibited reduced sustained/selective attention, decreased awareness of deficits, and delayed processing. SLUMS re-administered today (scored 9/30 on 12-22), with score of 22/30 indicating mild cognitive impairment. [name] did indicate Donnie's processing appears slower. Pt does not appear motivation to return to managing finances or medications when discussed. Pt enjoyed reading and completing crossword puzzles, so SLP will address cognitive communication deficits related to personally relevant tasks. Skilled ST is warranted to address moderate oropharyngeal dysphagia and mild cognitive impairment to optimize return to baseline and increase safety during PO intake.    Speech Therapy Frequency 2x / week    Duration 12 weeks    Treatment/Interventions Compensatory strategies;Aspiration precaution training;Pharyngeal strengthening exercises;Cueing hierarchy;Functional tasks;Patient/family education;SLP instruction and feedback;Internal/external aids;Language facilitation;Compensatory techniques;Multimodal communcation approach    Potential to Achieve Goals Fair    Potential Considerations Cooperation/participation level;Previous level of function;Ability to learn/carryover information;Family/community support    Consulted and Agree with Plan of Care Patient;Family member/caregiver             Patient will benefit from skilled therapeutic intervention in order to improve the following deficits and impairments:   Dysphagia, oropharyngeal phase  Cognitive communication deficit    Problem List Patient Active Problem List   Diagnosis Date Noted   Protein-calorie malnutrition, severe 02/06/2021   Infarction of right basal ganglia ([place]) 02/06/2021   Stroke determined by clinical assessment [facility]) 01/30/2021    Alinda Deem, [place] 03/01/2021, 12:07 PM  [facility] [address] [place], [place],  [address] Phone: [phone]   Fax:  [phone]  Name: Jeromie Gainor MRN: [id number] Date of Birth: Jul 19, 1945

## 2021-03-02 ENCOUNTER — Other Ambulatory Visit (HOSPITAL_COMMUNITY): Payer: Self-pay

## 2021-03-02 ENCOUNTER — Telehealth (HOSPITAL_COMMUNITY): Payer: Self-pay | Admitting: Pharmacy Technician

## 2021-03-02 NOTE — Telephone Encounter (Signed)
Transitions of Care Pharmacy   Call attempted for a pharmacy transitions of care follow-up. HIPAA appropriate voicemail was left with call back information provided.   Call attempt #1. Will follow-up in 2-3 days.   Parthenia Ames, PharmD

## 2021-03-05 ENCOUNTER — Encounter: Payer: Self-pay | Admitting: Registered Nurse

## 2021-03-05 ENCOUNTER — Encounter: Payer: Medicare HMO | Attending: Registered Nurse | Admitting: Registered Nurse

## 2021-03-05 ENCOUNTER — Other Ambulatory Visit: Payer: Self-pay

## 2021-03-05 VITALS — BP 136/89 | HR 81 | Temp 97.6°F | Ht 68.0 in | Wt 127.0 lb

## 2021-03-05 DIAGNOSIS — I639 Cerebral infarction, unspecified: Secondary | ICD-10-CM | POA: Insufficient documentation

## 2021-03-05 DIAGNOSIS — I10 Essential (primary) hypertension: Secondary | ICD-10-CM | POA: Diagnosis not present

## 2021-03-05 DIAGNOSIS — R69 Illness, unspecified: Secondary | ICD-10-CM | POA: Diagnosis not present

## 2021-03-05 DIAGNOSIS — F1910 Other psychoactive substance abuse, uncomplicated: Secondary | ICD-10-CM | POA: Insufficient documentation

## 2021-03-05 MED ORDER — AMLODIPINE BESYLATE 10 MG PO TABS: 10.0000 mg | ORAL_TABLET | Freq: Every day | ORAL | 0 refills | Status: DC

## 2021-03-05 MED ORDER — ASPIRIN 81 MG PO CHEW: 81.0000 mg | CHEWABLE_TABLET | Freq: Every day | ORAL | Status: DC

## 2021-03-05 MED ORDER — AMANTADINE HCL 50 MG/5ML PO SOLN: 100.0000 mg | Freq: Every day | ORAL | 0 refills | Status: DC

## 2021-03-05 MED ORDER — LACOSAMIDE 50 MG PO TABS: 50.0000 mg | ORAL_TABLET | Freq: Two times a day (BID) | ORAL | 0 refills | Status: DC

## 2021-03-05 MED ORDER — ATORVASTATIN CALCIUM 40 MG PO TABS: 40.0000 mg | ORAL_TABLET | Freq: Every day | ORAL | 0 refills | Status: DC

## 2021-03-05 NOTE — Progress Notes (Signed)
Subjective:    Patient ID: Douglas Spencer, male    DOB: [birth year], 76 y.o.   MRN: [id number]  HPI: Douglas Spencer is a 76 y.o. male who is here for [facility] appointment for follow up of his  Infarction of Right Basal Ganglia, Essential Hypertension and Polysubstance Abuse. He presented to [facility] on 01/30/2021 H&P: Dr Matilde Sprang on 01/30/2021 Douglas Spencer is a 76 y.o. male who presents the emergency department as a stroke alert.  Last known well approximately 2045 with initial deficits of right-sided gaze, left-sided weakness and drift, slurred speech.  Patient able to [facility] during the symptoms and is actively displaying the symptoms here in the emergency department.  Patient with an apparent seizure history but is no longer taking Keppra due to negative side effects.   CT Head WO Contrast:  IMPRESSION: 1. No acute intracranial abnormality. 2. ASPECTS is 10. 3. Age-related cerebral atrophy with chronic microvascular ischemic disease, with small remote left cerebellar infarct.  CT Angio: IMPRESSION: 1. Negative CTA for large vessel occlusion. 2. Mild for age atheromatous change about the carotid bifurcations without hemodynamically significant stenosis.   MR Brain: WO Contrast: IMPRESSION: Acute infarct right lenticulostriate territory. Moderately large territory involved. No intracranial hemorrhage   Atrophy and mild chronic microvascular ischemic change   Prominent superior ophthalmic vein bilaterally right greater left best seen on axial and coronal FLAIR. No associated abnormality on recent CT angio. This may be flow related artifact  His UDS was positive for marijuana and cocaine. .  Neurology was consulted, she was maintained on aspirin and Plavix x 3 weeks then aspirin alone.   Douglas Spencer was admitted to inpatient rehabilitation on 02/06/2021  and discharged on 02/21/2021. He is scheduled for outpatient therapy at [facility]. He denies pain. He rates his  pain 0. He reports his appetite is good.   [facility] was called, he has scheduled appointment of Neurology.     Pain Inventory Average Pain 0 Pain Right Now 0 My pain is  no pain  LOCATION OF PAIN  No pain. Here for Stroke follow up  BOWEL Number of stools per week: 7  BLADDER Normal   Mobility walk without assistance walk with assistance ability to climb steps?  yes do you drive?  no Do you have any goals in this area?  yes  Function retired I need assistance with the following:  dressing, meal prep, and shopping Do you have any goals in this area?  yes  Neuro/Psych trouble walking  Prior Studies Any changes since last visit?  no  Physicians involved in your care Any changes since last visit?  no   No family history on file. Social History   Socioeconomic History   Marital status: Single    Spouse name: Not on file   Number of children: Not on file   Years of education: Not on file   Highest education level: Not on file  Occupational History   Not on file  Tobacco Use   Smoking status: Former    Packs/day: 0.50    Types: Cigarettes   Smokeless tobacco: Not on file  Vaping Use   Vaping Use: Former  Substance and Sexual Activity   Alcohol use: Not Currently    Comment: pt states he drinks multiple beers every day.   Drug use: Not Currently    Types: Marijuana, Cocaine   Sexual activity: Not Currently  Other Topics Concern   Not on file  Social History Narrative  Not on file   Social Determinants of Health   Financial Resource Strain: Not on file  Food Insecurity: Not on file  Transportation Needs: Not on file  Physical Activity: Not on file  Stress: Not on file  Social Connections: Not on file   History reviewed. No pertinent surgical history. Past Medical History:  Diagnosis Date   ETOH abuse    Substance abuse ([facility])    BP 136/89    Pulse 81    Temp 97.6 F (36.4 C)    Ht 5\' 8"  (1.727 m)    Wt 127 lb (57.6 kg)    SpO2  99%    BMI 19.31 kg/m   Opioid Risk Score:   Fall Risk Score:  `1  Depression screen PHQ 2/9  Depression screen PHQ 2/9 03/05/2021  Decreased Interest 0  Down, Depressed, Hopeless 0  PHQ - 2 Score 0  Altered sleeping 0  Tired, decreased energy 0  Change in appetite 0  Feeling bad or failure about yourself  0  Trouble concentrating 1  Moving slowly or fidgety/restless 0  Suicidal thoughts 0  PHQ-9 Score 1    Review of Systems  HENT:  Positive for drooling.   Musculoskeletal:        Off balance when walking  Neurological:  Positive for weakness.  Psychiatric/Behavioral:         Anxiety   All other systems reviewed and are negative.     Objective:   Physical Exam Vitals and nursing note reviewed.  Constitutional:      Appearance: Normal appearance.  Cardiovascular:     Rate and Rhythm: Normal rate and regular rhythm.     Pulses: Normal pulses.     Heart sounds: Normal heart sounds.  Pulmonary:     Effort: Pulmonary effort is normal.     Breath sounds: Normal breath sounds.  Musculoskeletal:     Cervical back: Normal range of motion and neck supple.     Comments: Normal Muscle Bulk and Muscle Testing Reveals:  Upper Extremities: Right: Full ROM and Muscle Strength 4/5 Left: Decreased ROM 90 Degrees and Muscle Strength 3/5 Lower Extremities: Full ROM and Muscle Strength 5/5 Arises from Table with ease Narrow Based  Gait     Skin:    General: Skin is warm and dry.  Neurological:     Mental Status: He is alert and oriented to person, place, and time.  Psychiatric:        Mood and Affect: Mood normal.        Behavior: Behavior normal.         Assessment & Plan:  Right Basal Ganglia: Continue current medication. [name] was called, he has a scheduled appointment with Neurology.  Essential Hypertension: Continue current medication regimen: PCP Following. Continue to Monitor.  Polysubstance Abuse.: Douglas Spencer states he hasn't used any illicit drug  products. He was educated on cessation of drughs. He verbalizes understanding.   F/U with Dr Naaman Plummer in 4- 6 weeks

## 2021-03-05 NOTE — Patient Instructions (Signed)
My- Chart:   [phone]  

## 2021-03-07 ENCOUNTER — Encounter: Payer: Medicare HMO | Admitting: Registered Nurse

## 2021-03-09 ENCOUNTER — Encounter: Payer: Self-pay | Admitting: Registered Nurse

## 2021-03-13 ENCOUNTER — Other Ambulatory Visit: Payer: Self-pay

## 2021-03-13 ENCOUNTER — Encounter: Payer: Self-pay | Admitting: Occupational Therapy

## 2021-03-13 ENCOUNTER — Ambulatory Visit: Payer: Medicare HMO

## 2021-03-13 ENCOUNTER — Ambulatory Visit: Payer: Medicare HMO | Admitting: Physical Therapy

## 2021-03-13 ENCOUNTER — Ambulatory Visit: Payer: Medicare HMO | Admitting: Occupational Therapy

## 2021-03-13 DIAGNOSIS — R2689 Other abnormalities of gait and mobility: Secondary | ICD-10-CM | POA: Diagnosis not present

## 2021-03-13 DIAGNOSIS — R2681 Unsteadiness on feet: Secondary | ICD-10-CM

## 2021-03-13 DIAGNOSIS — M6281 Muscle weakness (generalized): Secondary | ICD-10-CM | POA: Diagnosis not present

## 2021-03-13 DIAGNOSIS — R262 Difficulty in walking, not elsewhere classified: Secondary | ICD-10-CM | POA: Diagnosis not present

## 2021-03-13 DIAGNOSIS — R41842 Visuospatial deficit: Secondary | ICD-10-CM

## 2021-03-13 DIAGNOSIS — R278 Other lack of coordination: Secondary | ICD-10-CM

## 2021-03-13 DIAGNOSIS — R41841 Cognitive communication deficit: Secondary | ICD-10-CM | POA: Diagnosis not present

## 2021-03-13 DIAGNOSIS — R41840 Attention and concentration deficit: Secondary | ICD-10-CM | POA: Diagnosis not present

## 2021-03-13 DIAGNOSIS — R1312 Dysphagia, oropharyngeal phase: Secondary | ICD-10-CM

## 2021-03-13 DIAGNOSIS — I69354 Hemiplegia and hemiparesis following cerebral infarction affecting left non-dominant side: Secondary | ICD-10-CM

## 2021-03-13 DIAGNOSIS — I639 Cerebral infarction, unspecified: Secondary | ICD-10-CM | POA: Diagnosis not present

## 2021-03-13 NOTE — Patient Instructions (Addendum)
SWALLOWING EXERCISES  Effortful Swallows - Squeeze hard with the muscles in your neck while you swallow your  saliva or a sip of water - Repeat 25-30 times, 3-4 times a day, and use whenever you eat or drink  [place] Swallow - swallow with your tongue sticking out - Stick tongue out past the lips - -gently bite tongue to hold it steady, and swallow - Repeat 25-30 times, 3-4 times a day *use a wet spoon if your mouth gets dry*  Pitch Raise - Repeat he, once per second in as high of a pitch as you can - Repeat 25-30 times, 3-4 times a day  [place]aker Exercise - head lift - Lie flat on your back in your bed or on a couch without pillows - Raise your head and look at your feet  - KEEP YOUR SHOULDERS DOWN - HOLD FOR 60 SECONDS, then lower your head back down - Repeat 4 times, 3 times a day  [employer] - half swallow exercise - Start to swallow and squeeze tight to hold your Adams apple up with the muscles of the throat - Hold for 7-10 seconds and then relax - Repeat 20-25 times, 3-4 times a day *use a wet spoon if your mouth gets dry*  Tongue Press - Press your entire tongue as hard as you can against the roof of your mouth for 7-10 seconds - Repeat 20-25 times, 3-4 times a day  Tongue Stretch/Teeth Clean - Move your tongue around the pocket between your gums and teeth, clockwise and then counter-clockwise - Repeat on the back side, clockwise and then counter-clockwise - Repeat 15-20 times, 3-4 times a day

## 2021-03-13 NOTE — Therapy (Signed)
[facility] [address] [place], [place], [address] Phone: [phone]   Fax:  [phone]  Occupational Therapy Treatment  Patient Details  Name: Douglas Spencer MRN: [id number] Date of Birth: January 12, 1946 Referring Provider (OT): Dr Naaman Plummer   Encounter Date: 03/13/2021   OT End of Session - 03/13/21 1630     Visit Number 2    Number of Visits 17    Authorization Type [employer] Medicare    Progress Note Due on Visit 10    OT Start Time 1400    OT Stop Time 1445    OT Time Calculation (min) 45 min    Activity Tolerance Patient tolerated treatment well    Behavior During Therapy [facility] for tasks assessed/performed             Past Medical History:  Diagnosis Date   ETOH abuse    Substance abuse ([place])     History reviewed. No pertinent surgical history.  There were no vitals filed for this visit.   Subjective Assessment - 03/13/21 1402     Subjective  My right hand is getting like my left hand    Currently in Pain? No/denies    Pain Score 0-No pain                          OT Treatments/Exercises (OP) - 03/13/21 0001       ADLs   ADL Comments Reviewed OT short term goals.  Patient in agreement.  Patient reports improving motion in LUE.  Patient also reported a fall recently.  Patient was getting up from toilet and adjusting pants.  Patient's caregiver instructed him to not get up - "I knew better"  Patient able to tie shoe today - when holding shoe in lap.  Patient Had difficulty tying shoe when he had to reach to the floor, or even when foot propped on footstool.  Patient able to cross leg and tie shoe with increased time.      Neurological Re-education Exercises   Other Exercises 1 Started supine bilateral shoulder exercises.  Patient reporting fatigue, ache in distal LUE.  Patient needing cues to maintain extended elbow                    OT Education - 03/13/21 1627      Education Details Reviewed short term goals,    Person(s) Educated Patient    Methods Explanation    Comprehension Verbalized understanding              OT Short Term Goals - 03/13/21 1403       OT SHORT TERM GOAL #1   Title Patient will complete an HEP designed to improve range of motion in LUE    Time 4    Period Weeks    Status On-going    Target Date 04/14/21      OT SHORT TERM GOAL #2   Title Patient will dress his upper body with increased time and min assistance using BUE - includes buttons/zippers    Time 4    Period Weeks    Status On-going      OT SHORT TERM GOAL #3   Title Patient will tie shoes with increased time using BUE    Time 4    Period Weeks    Status On-going      OT SHORT TERM GOAL #4   Title Patient will demonstrate 3 lb  increase in grip strength in LUE    Time 4    Period Weeks    Status On-going               OT Long Term Goals - 03/13/21 1405       OT LONG TERM GOAL #1   Title Patient will complete updated HEP designed to improve functional movement, strength and coordiantion in LUE    Time 8    Period Weeks    Status New    Target Date 05/14/21      OT LONG TERM GOAL #2   Title Patient will complete Box and Blocks - at last 20 blocks - LUE    Baseline 7    Time 8    Period Weeks    Status New      OT LONG TERM GOAL #3   Title Patient will demonstrate 10 lb increase in grip strength, and 3 lb increase in lateral pinch strength with LUE    Time 8    Period Weeks    Status New      OT LONG TERM GOAL #4   Title Patient will dress himself - includes fasteners, in reasonable amount ot itme, and using BUE    Time 8    Period Weeks    Status New      OT LONG TERM GOAL #5   Title Patient will visually scan on page to read full paragraph with environmental cueing    Time 8    Period Weeks    Status New                   Plan - 03/13/21 1631     Clinical Impression Statement Patient reports improved active  movement in LUE, and he is attemtping to hold things and use functionally.  Patient did have recent fall needs increased work on LE strengthening and balance as well as functional movement of non dominant LUE    OT Occupational Profile and History Detailed Assessment- Review of Records and additional review of physical, cognitive, psychosocial history related to current functional performance    Occupational performance deficits (Please refer to evaluation for details): ADL's;IADL's;Leisure    Body Structure / Function / Physical Skills ADL;Decreased knowledge of use of DME;Strength;Balance;Dexterity;GMC;Tone;Body mechanics;Proprioception;UE functional use;Endurance;IADL;ROM;Vestibular;Vision;Coordination;Flexibility;Mobility;Decreased knowledge of precautions;FMC    Cognitive Skills Attention;Problem Solve;Safety Awareness;Sequencing;Temperament/Personality;Learn;Thought;Memory;Understand;Orientation;Perception    Psychosocial Skills Coping Strategies    Rehab Potential Fair    Clinical Decision Making Several treatment options, min-mod task modification necessary    Comorbidities Affecting Occupational Performance: May have comorbidities impacting occupational performance    Modification or Assistance to Complete Evaluation  Min-Moderate modification of tasks or assist with assess necessary to complete eval    OT Frequency 2x / week    OT Duration 8 weeks    OT Treatment/Interventions Self-care/ADL training;Moist Heat;Fluidtherapy;DME and/or AE instruction;Splinting;Balance training;Aquatic Therapy;Therapeutic activities;Ultrasound;Therapeutic exercise;Cognitive remediation/compensation;Coping strategies training;Visual/perceptual remediation/compensation;Passive range of motion;Functional Mobility Training;Neuromuscular education;Electrical Stimulation;Manual Therapy;Patient/family education    Plan NMR LUE, Needs dynamic balance training and functional visual scanning to left - improve attention  from seconds to minutes    Consulted and Agree with Plan of Care Patient;Family member/caregiver             Patient will benefit from skilled therapeutic intervention in order to improve the following deficits and impairments:   Body Structure / Function / Physical Skills: ADL, Decreased knowledge of use of DME, Strength, Balance, Dexterity, GMC, Tone, Body mechanics, Proprioception,  UE functional use, Endurance, IADL, ROM, Vestibular, Vision, Coordination, Flexibility, Mobility, Decreased knowledge of precautions, [place] Cognitive Skills: Attention, Problem Solve, Safety Awareness, Sequencing, Temperament/Personality, Learn, Thought, Memory, Understand, Orientation, Perception Psychosocial Skills: Coping Strategies   Visit Diagnosis: Hemiplegia and hemiparesis following cerebral infarction affecting left non-dominant side (HCC)  Visuospatial deficit  Other lack of coordination  Muscle weakness (generalized)  Unsteadiness on feet  Attention and concentration deficit    Problem List Patient Active Problem List   Diagnosis Date Noted   Protein-calorie malnutrition, severe 02/06/2021   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 [place], [place], [address] Phone: [phone]   Fax:  [phone]  Name: Douglas Spencer MRN: [id number] Date of Birth: December 26, 1945

## 2021-03-13 NOTE — Therapy (Signed)
[facility] [address] [place], [place], [address] Phone: [phone]   Fax:  [phone]  Physical Therapy Treatment  Patient Details  Name: Douglas Spencer MRN: [id number] Date of Birth: 07/10/1945 Referring Provider (PT): Cathlyn Parsons, PA-C   Encounter Date: 03/13/2021   PT End of Session - 03/13/21 1449     Visit Number 2    Number of Visits 16    Date for PT Re-Evaluation 04/25/21    Authorization Type [employer]na Medicare    Authorization - Visit Number 1    Progress Note Due on Visit 10    PT Start Time 6808    PT Stop Time 1530    PT Time Calculation (min) 45 min    Equipment Utilized During Treatment Gait belt    Activity Tolerance Patient tolerated treatment well    Behavior During Therapy WFL for tasks assessed/performed             Past Medical History:  Diagnosis Date   ETOH abuse    Substance abuse ([place])     No past surgical history on file.  There were no vitals filed for this visit.   Subjective Assessment - 03/13/21 1453     Subjective Pt reports trying to work on his heel to toe at home. He states his left arm has been getting better.    Limitations Walking;Lifting    How long can you sit comfortably? n/a    How long can you stand comfortably? n/a    How long can you walk comfortably? Has not tried going to grocery store    Patient Stated Goals Regain all of his strength and be able to walk outside to the grocery store again    Currently in Pain? No/denies                               [facility] Adult PT Treatment/Exercise - 03/13/21 0001       Exercises   Exercises Knee/Hip      Knee/Hip Exercises: Aerobic   Nustep L3 x 5 min      Knee/Hip Exercises: Seated   Sit to Sand 2 sets;with UE support   staggered stance L LE back     Knee/Hip Exercises: Supine   Bridges Strengthening;2 sets;10 reps    Knee Flexion Strengthening;Right;Left;2 sets;10 reps       Knee/Hip Exercises: Sidelying   Hip ABduction Strengthening;Left;2 sets;10 reps                 Balance Exercises - 03/13/21 0001       Balance Exercises: Standing   Tandem Stance Eyes open;2 reps;30 secs    SLS with Vectors --   foot tap on 4" step 2x10 R&L LE   Rockerboard Lateral;Anterior/posterior;EO;30 seconds   statically x30 sec; with A/P weight shift x10; R<>L weight shift x 10                 PT Short Term Goals - 02/28/21 1511       PT SHORT TERM GOAL #1   Title Pt will be able to perfrom HEP with SBA    Time 4    Period Weeks    Status New    Target Date 03/28/21      PT SHORT TERM GOAL #2   Title Pt will demo improved 5x STS to </=15 sec    Baseline 18 sec  Time 4    Period Weeks    Status New    Target Date 03/28/21      PT SHORT TERM GOAL #3   Title Pt will demo improved gait speed to at least 0.6 m/s for improved limited community amb    Baseline 0.4 m/s    Time 4    Period Weeks    Status New    Target Date 03/28/21      PT SHORT TERM GOAL #4   Title Pt will have improved Berg Balance Score to at least 45/56 to demo decrased fall risk    Baseline 43/56    Time 4    Period Weeks    Status New    Target Date 03/28/21               PT Long Term Goals - 02/28/21 1513       PT LONG TERM GOAL #1   Title Pt will be independent with maintaining exercises at home    Time 8    Period Weeks    Status New    Target Date 04/25/21      PT LONG TERM GOAL #2   Title Pt will be able to perform 5x STS in </=12 sec to demo increased functional LE strength    Time 8    Period Weeks    Status New    Target Date 04/25/21      PT LONG TERM GOAL #3   Title Pt will have improved FGA score to at least 26/30 to demo decreased fall risk    Baseline 18/30    Time 8    Period Weeks    Status New    Target Date 04/25/21      PT LONG TERM GOAL #4   Title Pt will be able to amb to the grocery store (~10 min walk outside) with SBA to  demo return to PLOF    Baseline Has not attempted    Time 8    Period Weeks    Status New    Target Date 04/25/21      PT LONG TERM GOAL #5   Title Pt will have improved FOTO score to at least 75    Baseline 65    Time 8    Period Weeks    Status New    Target Date 04/25/21                   Plan - 03/13/21 1454     Clinical Impression Statement Treatment focused on initiating a strength and balance exercise. Continued to work on improving gait, left side attention and weight shift. Pt tolerated well.    Personal Factors and Comorbidities Age;Time since onset of injury/illness/exacerbation;Past/Current Experience    Examination-Activity Limitations Locomotion Level;Squat;Transfers    Examination-Participation Restrictions Community Activity;Yard Work    [employer] Evolving/Moderate complexity    Rehab Potential Good    PT Frequency 2x / week    PT Duration 8 weeks    PT Treatment/Interventions ADLs/Self Care Home Management;Aquatic Therapy;DME Instruction;Gait training;Stair training;Functional mobility training;Therapeutic activities;Therapeutic exercise;Balance training;Neuromuscular re-education;Manual techniques;Patient/family education;Dry needling;Orthotic Fit/Training;Taping;Vestibular    PT Next Visit Plan Has pt been practicing heel strike/arm swing during gait? Initiate strengthening and balance HEP. Work on narrow BOS, compliant surfaces, L LE stability.    PT Home Exercise Plan Pt to practice heel strike and arm swing    Consulted and Agree with Plan of Care Patient;Family  member/caregiver    Family Member Consulted Caregiver, [name]             Patient will benefit from skilled therapeutic intervention in order to improve the following deficits and impairments:  Abnormal gait, Difficulty walking, Impaired UE functional use, Decreased endurance, Decreased activity tolerance, Decreased balance, Decreased mobility, Decreased  strength  Visit Diagnosis: Other lack of coordination  Muscle weakness (generalized)  Unsteadiness on feet  Hemiplegia and hemiparesis following cerebral infarction affecting left non-dominant side (HCC)  Difficulty in walking, not elsewhere classified  Other abnormalities of gait and mobility     Problem List Patient Active Problem List   Diagnosis Date Noted   Protein-calorie malnutrition, severe 02/06/2021   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

## 2021-03-13 NOTE — Therapy (Signed)
[facility] [address] [place], [place], [address] Phone: [phone]   Fax:  [phone]  Speech Language Pathology Treatment  Patient Details  Name: Douglas Spencer MRN: [id number] Date of Birth: 02-02-1946 Referring Provider (SLP): Cathlyn Parsons, PA-C   Encounter Date: 03/13/2021   End of Session - 03/13/21 1311     Visit Number 2    Number of Visits 25    Date for SLP Re-Evaluation 05/25/21    Authorization Type Aetna Medicare    SLP Start Time 1316    SLP Stop Time  1400    SLP Time Calculation (min) 44 min    Activity Tolerance Patient tolerated treatment well             Past Medical History:  Diagnosis Date   ETOH abuse    Substance abuse ([place])     History reviewed. No pertinent surgical history.  There were no vitals filed for this visit.   Subjective Assessment - 03/13/21 1315     Subjective Pt entered with wet vocal quality    Patient is accompained by: --   caregiver elected to wait in lobby due to concern for distractability   Currently in Pain? No/denies                   ADULT SLP TREATMENT - 03/13/21 1311       General Information   Behavior/Cognition Alert;Pleasant mood;Requires cueing      Treatment Provided   Treatment provided Dysphagia      Dysphagia Treatment   Oral Cavity - Dentition Dentures, bottom;Dentures, top    Treatment Methods Therapeutic exercise;Compensation strategy training;Patient/caregiver education    Patient observed directly with PO's Yes    Type of PO's observed Thin liquids    Feeding Able to feed self    Liquids provided via Cup    Oral Phase Signs & Symptoms Oral holding    Pharyngeal Phase Signs & Symptoms Delayed cough;Wet vocal quality    Type of cueing Verbal    Amount of cueing Minimal    Other treatment/comments Pt entered with wet vocal quality, in which SLP cued strong throat clear that was effective. SLP educated and  instructed recommended swallow exercises to target oral and pharyngeal deficits evidenced on most recent MBSS which revealed moderate oropharygneal dysphagia. Repetition and SLP modeling was effective to aid patient comprehension. Pt completed effortful swallows x20 and Masako swallows x2. Pt unable to accurately complete pitch raises despite SLP modeling; will further target. Some hesitancy exhibited re: completing recommended swallow exercises at home as pt can be "lazy." SLP again provided education for rationale to target oral and pharyngeal deficits to optimize swallow function and reduce risk of aspiration. Pt verbalized understanding. Pt was recommended Dysphagia 2/NTL diet; however, pt has elected not to consume recommendations textures despite education of recommendations and risks of aspiration PNA. Pt consumed cup sips of water x4, with wet vocal quality x1 and occasional delayed coughing exhibited. SLP recommended intermittent voluntary throat clears/coughing during meals to reduce possible risk of aspiration, in which pt indicated understanding.      Assessment / Recommendations / Plan   Plan Continue with current plan of care      Progression Toward Goals   Progression toward goals Progressing toward goals              SLP Education - 03/13/21 1642     Education Details swallow exercise HEP, swallow precaution recommendations, GI  consult    Person(s) Educated Patient    Methods Explanation;Demonstration;Verbal cues;Handout    Comprehension Verbalized understanding;Returned demonstration;Verbal cues required;Need further instruction              SLP Short Term Goals - 03/13/21 1312       SLP SHORT TERM GOAL #1   Title Pt will verbalize and demonstrate recommended swallow exercises with occasional min A over 2 sessions    Time 4    Period Weeks    Status On-going      SLP SHORT TERM GOAL #2   Title Pt will complete dysphagia HEP as recommended for 4/5 days with  occasional min A from caregiver    Time 4    Period Weeks    Status On-going      SLP SHORT TERM GOAL #3   Title Caregiver will assist with carryover of swallow exercise HEP and compensatory swallow strategies to reduce risk of aspiration given rare min A over 2 sessions    Time 4    Period Weeks    Status On-going      SLP SHORT TERM GOAL #4   Title Pt will identify errors and correct problems in functional and structured scenarios given occasional min A over 2 sessions    Time 4    Period Weeks    Status On-going      SLP SHORT TERM GOAL #5   Title Pt will implement compensations to aid processing speed on structured and unstructured tasks with 80% accuracy over 2 sessions    Time 4    Period Weeks    Status On-going              SLP Long Term Goals - 03/13/21 1312       SLP LONG TERM GOAL #1   Title Pt and caregiver will implement recommended compensatory strategies to reduce risk of aspiration given rare min A over 2 sessions    Time 8    Period Weeks    Status On-going      SLP LONG TERM GOAL #2   Title Pt will participate in objective swallow study (as agreeable) to re-evaluate oropharyngeal dysphagia    Time 8    Period Weeks    Status On-going      SLP LONG TERM GOAL #3   Title Pt will demonstrate awareness of deficits and modify functional scenarios to ensure accurate and safe completion given rare min A over 2 sessions    Time 12    Period Weeks    Status On-going      SLP LONG TERM GOAL #4   Title Pt will demonstrate improved sustained attention and auditory processing with increased participation in iADL and ADLS given rare min A over 2 sessions    Time 12    Period Weeks    Status On-going              Plan - 03/13/21 1311     Clinical Impression Statement "Douglas Spencer" was referred for OPST secondary to cerebral infarction on 01-30-21. Initiated education and training of swallow exercise HEP to target oral and pharyngeal deficits evidenced on  most recent MBSS on 02-20-21. SLP educated and instructed recommended swallow compensations to decrease risk of aspiration as pt has elected to consume mechanical soft/regular textures and thin liquids despite education. Skilled ST is warranted to address moderate oropharyngeal and mild cognitive impairment to optimize return to baseline and increase safety.    Speech Therapy  Frequency 2x / week    Duration 12 weeks    Treatment/Interventions Compensatory strategies;Aspiration precaution training;Pharyngeal strengthening exercises;Cueing hierarchy;Functional tasks;Patient/family education;SLP instruction and feedback;Internal/external aids;Language facilitation;Compensatory techniques;Multimodal communcation approach    Potential to Achieve Goals Fair    Potential Considerations Cooperation/participation level;Previous level of function;Ability to learn/carryover information;Family/community support    Consulted and Agree with Plan of Care Patient;Family member/caregiver             Patient will benefit from skilled therapeutic intervention in order to improve the following deficits and impairments:   Dysphagia, oropharyngeal phase  Cognitive communication deficit    Problem List Patient Active Problem List   Diagnosis Date Noted   Protein-calorie malnutrition, severe 02/06/2021   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

## 2021-03-14 ENCOUNTER — Ambulatory Visit: Payer: Medicare HMO | Attending: Physician Assistant | Admitting: Occupational Therapy

## 2021-03-14 ENCOUNTER — Ambulatory Visit: Payer: Medicare HMO

## 2021-03-14 ENCOUNTER — Encounter: Payer: Self-pay | Admitting: Occupational Therapy

## 2021-03-14 ENCOUNTER — Ambulatory Visit: Payer: Medicare HMO | Admitting: Physical Therapy

## 2021-03-14 DIAGNOSIS — R2681 Unsteadiness on feet: Secondary | ICD-10-CM

## 2021-03-14 DIAGNOSIS — M6281 Muscle weakness (generalized): Secondary | ICD-10-CM

## 2021-03-14 DIAGNOSIS — R262 Difficulty in walking, not elsewhere classified: Secondary | ICD-10-CM

## 2021-03-14 DIAGNOSIS — R2689 Other abnormalities of gait and mobility: Secondary | ICD-10-CM

## 2021-03-14 DIAGNOSIS — R1312 Dysphagia, oropharyngeal phase: Secondary | ICD-10-CM

## 2021-03-14 DIAGNOSIS — I69354 Hemiplegia and hemiparesis following cerebral infarction affecting left non-dominant side: Secondary | ICD-10-CM | POA: Diagnosis present

## 2021-03-14 DIAGNOSIS — R41842 Visuospatial deficit: Secondary | ICD-10-CM | POA: Diagnosis present

## 2021-03-14 DIAGNOSIS — R41840 Attention and concentration deficit: Secondary | ICD-10-CM | POA: Diagnosis present

## 2021-03-14 DIAGNOSIS — R278 Other lack of coordination: Secondary | ICD-10-CM | POA: Insufficient documentation

## 2021-03-14 NOTE — Patient Instructions (Signed)
SWALLOWING EXERCISES   Effortful Swallows - Squeeze hard with the muscles in your neck while you swallow your  saliva or a sip of water - Repeat 25-30 times, 3-4 times a day, and use whenever you eat or drink   Masako Swallow - swallow with your tongue sticking out - Stick tongue out past the lips - -gently bite tongue to hold it steady, and swallow - Repeat 25-30 times, 3-4 times a day *use a wet spoon if your mouth gets dry*   Pitch Raise - Repeat eeee, raise your pitch as high as you can go (low to high) - Repeat 25-30 times, 3-4 times a day   Shaker Exercise - head lift - Lie flat on your back in your bed or on a couch without pillows - Raise your head and look at your feet  - KEEP YOUR SHOULDERS DOWN - HOLD FOR 30-60 SECONDS (if you're able), then lower your head back down - Repeat 4 times, 3 times a day   [employer] - half swallow exercise   ** you can try this at home; don't be discouraged if you don't get it (this is the toughest in my open) - Start to swallow and squeeze tight to hold your Adams apple up with the muscles of the throat - Hold for 7-10 seconds and then relax - Repeat 20-25 times, 3-4 times a day *use a wet spoon if your mouth gets dry*   Tongue Press - Press your entire tongue as hard as you can against the roof of your mouth for 5-10 seconds **TONGUE TIP up to the roof of your mouth - Repeat 20-25 times, 3-4 times a day   Tongue Stretch/Teeth Clean - Move your tongue around the pocket between your gums and teeth, clockwise and then counter-clockwise (Like you're cleaning the sides of your teeth) - Repeat on the back side, clockwise and then counter-clockwise - Repeat 15-20 times, 3-4 times a day

## 2021-03-14 NOTE — Therapy (Signed)
[facility] [address] [place], [place], [address] Phone: [phone]   Fax:  [phone]  Physical Therapy Treatment  Patient Details  Name: Douglas Spencer MRN: [id number] Date of Birth: 03-Mar-1945 Referring Provider (PT): Cathlyn Parsons, PA-C   Encounter Date: 03/14/2021   PT End of Session - 03/14/21 1532     Visit Number 3    Number of Visits 16    Date for PT Re-Evaluation 04/25/21    Authorization Type [employer]na Medicare    Authorization - Visit Number 2    Progress Note Due on Visit 10    PT Start Time 1532    PT Stop Time 1615    PT Time Calculation (min) 43 min    Equipment Utilized During Treatment Gait belt    Activity Tolerance Patient tolerated treatment well    Behavior During Therapy WFL for tasks assessed/performed             Past Medical History:  Diagnosis Date   ETOH abuse    Substance abuse ([place])     No past surgical history on file.  There were no vitals filed for this visit.   Subjective Assessment - 03/14/21 1536     Subjective Pt states he was a little sore after last session. Better this morning. Reports feeling a little tired today.    Limitations Walking;Lifting    How long can you sit comfortably? n/a    How long can you stand comfortably? n/a    How long can you walk comfortably? Has not tried going to grocery store    Patient Stated Goals Regain all of his strength and be able to walk outside to the grocery store again    Currently in Pain? No/denies                [facility] PT Assessment - 03/14/21 0001       Assessment   Medical Diagnosis I63.9 (ICD-10-CM) - Cerebral infarction, unspecified    Referring Provider (PT) Cathlyn Parsons, PA-C    Hand Dominance Right    Prior Therapy CIR 12/27-1/11/23      Precautions   Precautions Fall                           [facility] Adult PT Treatment/Exercise - 03/14/21 0001       Ambulation/Gait    Ambulation Distance (Feet) 460 Feet    Assistive device None    Gait Pattern Step-through pattern;Decreased dorsiflexion - left;Poor foot clearance - left;Decreased weight shift to left    Ambulation Surface Level;Indoor      Knee/Hip Exercises: Aerobic   Nustep L6 x 6 min UEs & LEs      Knee/Hip Exercises: Seated   Long Arc Quad Strengthening;Right;Left;2 sets;10 reps    Long Arc Quad Limitations blue tband    Other Seated Knee/Hip Exercises "V" seat 3x10 sec    Marching Strengthening;Right;Left;2 sets;10 reps    Marching Limitations blue tband    Hamstring Curl Strengthening;Right;Left;2 sets;10 reps    Hamstring Limitations blue tband                       PT Short Term Goals - 02/28/21 1511       PT SHORT TERM GOAL #1   Title Pt will be able to perfrom HEP with SBA    Time 4    Period Weeks  Status New    Target Date 03/28/21      PT SHORT TERM GOAL #2   Title Pt will demo improved 5x STS to </=15 sec    Baseline 18 sec    Time 4    Period Weeks    Status New    Target Date 03/28/21      PT SHORT TERM GOAL #3   Title Pt will demo improved gait speed to at least 0.6 m/s for improved limited community amb    Baseline 0.4 m/s    Time 4    Period Weeks    Status New    Target Date 03/28/21      PT SHORT TERM GOAL #4   Title Pt will have improved Berg Balance Score to at least 45/56 to demo decrased fall risk    Baseline 43/56    Time 4    Period Weeks    Status New    Target Date 03/28/21               PT Long Term Goals - 02/28/21 1513       PT LONG TERM GOAL #1   Title Pt will be independent with maintaining exercises at home    Time 8    Period Weeks    Status New    Target Date 04/25/21      PT LONG TERM GOAL #2   Title Pt will be able to perform 5x STS in </=12 sec to demo increased functional LE strength    Time 8    Period Weeks    Status New    Target Date 04/25/21      PT LONG TERM GOAL #3   Title Pt will have  improved FGA score to at least 26/30 to demo decreased fall risk    Baseline 18/30    Time 8    Period Weeks    Status New    Target Date 04/25/21      PT LONG TERM GOAL #4   Title Pt will be able to amb to the grocery store (~10 min walk outside) with SBA to demo return to PLOF    Baseline Has not attempted    Time 8    Period Weeks    Status New    Target Date 04/25/21      PT LONG TERM GOAL #5   Title Pt will have improved FOTO score to at least 75    Baseline 65    Time 8    Period Weeks    Status New    Target Date 04/25/21                   Plan - 03/14/21 1553     Clinical Impression Statement Pt appears more shaky and fatigued this session. Performed most of exercises in sitting. Able to use blue tband in sitting.    Personal Factors and Comorbidities Age;Time since onset of injury/illness/exacerbation;Past/Current Experience    Examination-Activity Limitations Locomotion Level;Squat;Transfers    Examination-Participation Restrictions Community Activity;Yard Work    [employer]chant [employer] Evolving/Moderate complexity    Rehab Potential Good    PT Frequency 2x / week    PT Duration 8 weeks    PT Treatment/Interventions ADLs/Self Care Home Management;Aquatic Therapy;DME Instruction;Gait training;Stair training;Functional mobility training;Therapeutic activities;Therapeutic exercise;Balance training;Neuromuscular re-education;Manual techniques;Patient/family education;Dry needling;Orthotic Fit/Training;Taping;Vestibular    PT Next Visit Plan Has pt been practicing heel strike/arm swing during gait? Initiate strengthening and balance  HEP. Work on narrow BOS, compliant surfaces, L LE stability.    PT Home Exercise Plan Pt to practice heel strike and arm swing    Consulted and Agree with Plan of Care Patient;Family member/caregiver    Family Member Consulted Caregiver, [name]             Patient will benefit from skilled therapeutic intervention in  order to improve the following deficits and impairments:  Abnormal gait, Difficulty walking, Impaired UE functional use, Decreased endurance, Decreased activity tolerance, Decreased balance, Decreased mobility, Decreased strength  Visit Diagnosis: Unsteadiness on feet  Hemiplegia and hemiparesis following cerebral infarction affecting left non-dominant side (HCC)  Muscle weakness (generalized)  Difficulty in walking, not elsewhere classified  Other abnormalities of gait and mobility     Problem List Patient Active Problem List   Diagnosis Date Noted   Protein-calorie malnutrition, severe 02/06/2021   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 [place], [place], [address] Phone: [phone]   Fax:  [phone]  Name: Douglas Spencer MRN: [id number] Date of Birth: 03-Apr-1945

## 2021-03-14 NOTE — Therapy (Signed)
[facility] [address] [place], [place], [address] Phone: [phone]   Fax:  [phone]  Occupational Therapy Treatment  Patient Details  Name: Douglas Spencer MRN: [id number] Date of Birth: 1945-08-30 Referring Provider (OT): Dr Naaman Plummer   Encounter Date: 03/14/2021   OT End of Session - 03/14/21 1409     Visit Number 3    Number of Visits 17    Authorization Type [employer]na Medicare    Progress Note Due on Visit 10    OT Start Time 1407    OT Stop Time 1445    OT Time Calculation (min) 38 min    Activity Tolerance Patient tolerated treatment well    Behavior During Therapy [facility] for tasks assessed/performed             Past Medical History:  Diagnosis Date   ETOH abuse    Substance abuse ([place])     History reviewed. No pertinent surgical history.  There were no vitals filed for this visit.   Subjective Assessment - 03/14/21 1409     Subjective  no pain today - i don't like pain    Currently in Pain? No/denies    Pain Score 0-No pain                          OT Treatments/Exercises (OP) - 03/14/21 0001       Neurological Re-education Exercises   Other Exercises 1 supine foam roller exercises chest press, shoulder flexion, horizontal abduction x 10 reps. sidelying with bodyweight on L side with active shoulder flexion, elbow ext/flex and supination/pronation with composite flexion and extension with LUE    Other Exercises 2 grasp and release of 1 inch blocks with grasping and reaching low level to bowl and releasing with good control and no drops.    Development of Reach [employer] Stretches x 10 reaching from seated edg e of mat                      OT Short Term Goals - 03/13/21 1403       OT SHORT TERM GOAL #1   Title Patient will complete an HEP designed to improve range of motion in LUE    Time 4    Period Weeks    Status On-going    Target Date  04/14/21      OT SHORT TERM GOAL #2   Title Patient will dress his upper body with increased time and min assistance using BUE - includes buttons/zippers    Time 4    Period Weeks    Status On-going      OT SHORT TERM GOAL #3   Title Patient will tie shoes with increased time using BUE    Time 4    Period Weeks    Status On-going      OT SHORT TERM GOAL #4   Title Patient will demonstrate 3 lb increase in grip strength in LUE    Time 4    Period Weeks    Status On-going               OT Long Term Goals - 03/13/21 [place] #1   Title Patient will complete updated HEP designed to improve functional movement, strength and coordiantion in LUE    Time 8    Period Weeks  Status New    Target Date 05/14/21      OT LONG TERM GOAL #2   Title Patient will complete Box and Blocks - at last 20 blocks - LUE    Baseline 7    Time 8    Period Weeks    Status New      OT LONG TERM GOAL #3   Title Patient will demonstrate 10 lb increase in grip strength, and 3 lb increase in lateral pinch strength with LUE    Time 8    Period Weeks    Status New      OT LONG TERM GOAL #4   Title Patient will dress himself - includes fasteners, in reasonable amount ot itme, and using BUE    Time 8    Period Weeks    Status New      OT LONG TERM GOAL #5   Title Patient will visually scan on page to read full paragraph with environmental cueing    Time 8    Period Weeks    Status New                   Plan - 03/14/21 1431     Clinical Impression Statement Pt reports LUE is working better every day. Reports being able to do things today that he was not previously able to do.    OT Occupational Profile and History Detailed Assessment- Review of Records and additional review of physical, cognitive, psychosocial history related to current functional performance    Occupational performance deficits (Please refer to evaluation for details): ADL's;IADL's;Leisure     Body Structure / Function / Physical Skills ADL;Decreased knowledge of use of DME;Strength;Balance;Dexterity;GMC;Tone;Body mechanics;Proprioception;UE functional use;Endurance;IADL;ROM;Vestibular;Vision;Coordination;Flexibility;Mobility;Decreased knowledge of precautions;FMC    Cognitive Skills Attention;Problem Solve;Safety Awareness;Sequencing;Temperament/Personality;Learn;Thought;Memory;Understand;Orientation;Perception    Psychosocial Skills Coping Strategies    Rehab Potential Fair    Clinical Decision Making Several treatment options, min-mod task modification necessary    Comorbidities Affecting Occupational Performance: May have comorbidities impacting occupational performance    Modification or Assistance to Complete Evaluation  Min-Moderate modification of tasks or assist with assess necessary to complete eval    OT Frequency 2x / week    OT Duration 8 weeks    OT Treatment/Interventions Self-care/ADL training;Moist Heat;Fluidtherapy;DME and/or AE instruction;Splinting;Balance training;Aquatic Therapy;Therapeutic activities;Ultrasound;Therapeutic exercise;Cognitive remediation/compensation;Coping strategies training;Visual/perceptual remediation/compensation;Passive range of motion;Functional Mobility Training;Neuromuscular education;Electrical Stimulation;Manual Therapy;Patient/family education    Plan NMR LUE, Needs dynamic balance training and functional visual scanning to left - improve attention from seconds to minutes    Consulted and Agree with Plan of Care Patient;Family member/caregiver             Patient will benefit from skilled therapeutic intervention in order to improve the following deficits and impairments:   Body Structure / Function / Physical Skills: ADL, Decreased knowledge of use of DME, Strength, Balance, Dexterity, GMC, Tone, Body mechanics, Proprioception, UE functional use, Endurance, IADL, ROM, Vestibular, Vision, Coordination, Flexibility, Mobility,  Decreased knowledge of precautions, FMC Cognitive Skills: Attention, Problem Solve, Safety Awareness, Sequencing, Temperament/Personality, Learn, Thought, Memory, Understand, Orientation, Perception Psychosocial Skills: Coping Strategies   Visit Diagnosis: Other lack of coordination  Unsteadiness on feet  Hemiplegia and hemiparesis following cerebral infarction affecting left non-dominant side (HCC)  Muscle weakness (generalized)  Attention and concentration deficit  Visuospatial deficit    Problem List Patient Active Problem List   Diagnosis Date Noted   Protein-calorie malnutrition, severe 02/06/2021   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 [place], [place], [address] Phone: [phone]   Fax:  [phone]  Name: Douglas Spencer MRN: [id number] Date of Birth: 1945-03-09

## 2021-03-14 NOTE — Therapy (Signed)
[facility] [address] [place], [place], [address] Phone: [phone]   Fax:  [phone]  Speech Language Pathology Treatment  Patient Details  Name: Douglas Spencer MRN: [id number] Date of Birth: [birth year] Referring Provider (SLP): Cathlyn Parsons, PA-C   Encounter Date: 03/14/2021   End of Session - 03/14/21 1407     Visit Number 3    Number of Visits 25    Date for SLP Re-Evaluation 05/25/21    Authorization Type Aetna Medicare    SLP Start Time 1440    SLP Stop Time  1525    SLP Time Calculation (min) 45 min    Activity Tolerance Patient tolerated treatment well             Past Medical History:  Diagnosis Date   ETOH abuse    Substance abuse ([place])     History reviewed. No pertinent surgical history.  There were no vitals filed for this visit.   Subjective Assessment - 03/14/21 1440     Subjective "not many" re: additional swallow exercises    Currently in Pain? No/denies                   ADULT SLP TREATMENT - 03/14/21 1407       General Information   Behavior/Cognition Alert;Pleasant mood;Requires cueing      Treatment Provided   Treatment provided Dysphagia      Dysphagia Treatment   Treatment Methods Therapeutic exercise;Compensation strategy training;Patient/caregiver education    Patient observed directly with PO's Yes    Type of PO's observed Thin liquids    Feeding Able to feed self    Liquids provided via Cup    Oral Phase Signs & Symptoms Oral holding    Pharyngeal Phase Signs & Symptoms Immediate cough;Delayed cough    Type of cueing Verbal    Amount of cueing Moderate    Other treatment/comments SLP continued education and training of swallow exercises to target oral and pharyngeal deficits on MBSS. Pt indicated he did attempt to complete additional swallow exercises at home last night as recommended. Some reduced comprehension and fatigue indicated while  completing Masako, Shaker, and pitch raise. SLP reviewed effortful swallow, Masako, pitch raise, and Shaker. Improved accuracy for Masako exhibited today with occasional min A. Pt still unable to complete pitch raise despite SLP modeling and max cues. SLP introduced Mendelsohn maneuver, tongue press, and tongue sweep today. Cognition appeared to impact pt comprehension of Douglas Spencer; therefore deferred at this time. Significant difficulty with lingual coordination and ROM exhibited. Pt c/o denture impacting lingual elevation/lateralization. SLP educated patient on importance of thorough oral care 2-3x/day, including cleaning dentures and mouth. Pt consumed cup sips of thin liquids with immediate cough exhibited for 1/4 sips. Delayed coughing exhibited.      Assessment / Recommendations / Plan   Plan Continue with current plan of care      Progression Toward Goals   Progression toward goals Progressing toward goals              SLP Education - 03/14/21 1520     Education Details review: Masako, Shaker, pitch raise; introduce: tongue press, tongue sweep, and Mendelsohn; thorough oral care for dentures and mouth    Person(s) Educated Patient    Methods Explanation;Demonstration;Handout;Verbal cues    Comprehension Verbalized understanding;Returned demonstration;Need further instruction              SLP Short Term Goals - 03/14/21 1408  SLP SHORT TERM GOAL #1   Title Pt will verbalize and demonstrate recommended swallow exercises with occasional min A over 2 sessions    Time 4    Period Weeks    Status On-going      SLP SHORT TERM GOAL #2   Title Pt will complete dysphagia HEP as recommended for 4/5 days with occasional min A from caregiver    Time 4    Period Weeks    Status On-going      SLP SHORT TERM GOAL #3   Title Caregiver will assist with carryover of swallow exercise HEP and compensatory swallow strategies to reduce risk of aspiration given rare min A over 2  sessions    Time 4    Period Weeks    Status On-going      SLP SHORT TERM GOAL #4   Title Pt will identify errors and correct problems in functional and structured scenarios given occasional min A over 2 sessions    Time 4    Period Weeks    Status On-going      SLP SHORT TERM GOAL #5   Title Pt will implement compensations to aid processing speed on structured and unstructured tasks with 80% accuracy over 2 sessions    Time 4    Period Weeks    Status On-going              SLP Long Term Goals - 03/14/21 1408       SLP LONG TERM GOAL #1   Title Pt and caregiver will implement recommended compensatory strategies to reduce risk of aspiration given rare min A over 2 sessions    Time 8    Period Weeks    Status On-going      SLP LONG TERM GOAL #2   Title Pt will participate in objective swallow study (as agreeable) to re-evaluate oropharyngeal dysphagia    Time 8    Period Weeks    Status On-going      SLP LONG TERM GOAL #3   Title Pt will demonstrate awareness of deficits and modify functional scenarios to ensure accurate and safe completion given rare min A over 2 sessions    Time 12    Period Weeks    Status On-going      SLP LONG TERM GOAL #4   Title Pt will demonstrate improved sustained attention and auditory processing with increased participation in iADL and ADLS given rare min A over 2 sessions    Time 12    Period Weeks    Status On-going              Plan - 03/14/21 1408     Clinical Impression Statement "Douglas Spencer" was referred for OPST secondary to cerebral infarction on 01-30-21. Conducted ongoing education and training of swallow exercise HEP to target oral and pharyngeal deficits evidenced on most recent MBSS on 02-20-21. Rare to usual A required to complete targeted exercises due to cognitive components. SLP educated and instructed thorough oral care to decrease risk of aspiration as pt has elected to consume mechanical soft/regular textures and thin  liquids despite recommendation/education. Skilled ST is warranted to address moderate oropharyngeal and mild cognitive impairment to optimize return to baseline and increase safety.    Speech Therapy Frequency 2x / week    Duration 12 weeks    Treatment/Interventions Compensatory strategies;Aspiration precaution training;Pharyngeal strengthening exercises;Cueing hierarchy;Functional tasks;Patient/family education;SLP instruction and feedback;Internal/external aids;Language facilitation;Compensatory techniques;Multimodal communcation approach    Potential to  Achieve Goals Fair    Potential Considerations Cooperation/participation level;Previous level of function;Ability to learn/carryover information;Family/community support    Consulted and Agree with Plan of Care Patient;Family member/caregiver             Patient will benefit from skilled therapeutic intervention in order to improve the following deficits and impairments:   Dysphagia, oropharyngeal phase    Problem List Patient Active Problem List   Diagnosis Date Noted   Protein-calorie malnutrition, severe 02/06/2021   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 [place], [place], [address] Phone: [phone]   Fax:  [phone]   Name: Douglas Spencer MRN: [id number] Date of Birth: 1945/07/23

## 2021-03-15 ENCOUNTER — Telehealth: Payer: Self-pay

## 2021-03-15 MED ORDER — HYDROXYZINE HCL 25 MG PO TABS: 25.0000 mg | ORAL_TABLET | Freq: Four times a day (QID) | ORAL | 0 refills | Status: DC | PRN

## 2021-03-15 NOTE — Telephone Encounter (Signed)
Atarax refill today.

## 2021-03-15 NOTE — Telephone Encounter (Signed)
[name]-caregiver for patient called stating patient is almost out of Atarax and needs a refill. [facility].

## 2021-03-16 ENCOUNTER — Telehealth: Payer: Self-pay | Admitting: *Deleted

## 2021-03-16 NOTE — Telephone Encounter (Signed)
Mr Douglas Spencer, caretaker for Mr Douglas Spencer called about his medications. First, he is going to be out of his atarax. He is taking 2 x day but can take up to 4 x per day but only gets #30.  He is showing signs of agitation and is going to need the medication.  Also all his other meds are going to need to be refilled because he does not get in with his PCP until early March.  Please advise

## 2021-03-19 NOTE — Telephone Encounter (Signed)
Mr Onnie Graham has called again about Mr Dolce's medication. He is wanting him to get more of the hydroxyzine for his agitation. Zella Ball signed for # 30 but it can be taken 4x day so is not lasting long enough. He also does not see to establish with PCP Scarlette Calico until 3/6 so will need refills on his medications. Can his hydroxizine be increased? He does not see  you until 06/20/21.

## 2021-03-20 ENCOUNTER — Ambulatory Visit: Payer: Medicare HMO

## 2021-03-20 ENCOUNTER — Encounter: Payer: Self-pay | Admitting: Occupational Therapy

## 2021-03-20 ENCOUNTER — Other Ambulatory Visit: Payer: Self-pay

## 2021-03-20 ENCOUNTER — Ambulatory Visit: Payer: Medicare HMO | Admitting: Physical Therapy

## 2021-03-20 ENCOUNTER — Ambulatory Visit: Payer: Medicare HMO | Admitting: Occupational Therapy

## 2021-03-20 DIAGNOSIS — R262 Difficulty in walking, not elsewhere classified: Secondary | ICD-10-CM | POA: Diagnosis not present

## 2021-03-20 DIAGNOSIS — R41840 Attention and concentration deficit: Secondary | ICD-10-CM | POA: Diagnosis not present

## 2021-03-20 DIAGNOSIS — R2689 Other abnormalities of gait and mobility: Secondary | ICD-10-CM

## 2021-03-20 DIAGNOSIS — R2681 Unsteadiness on feet: Secondary | ICD-10-CM

## 2021-03-20 DIAGNOSIS — R41842 Visuospatial deficit: Secondary | ICD-10-CM

## 2021-03-20 DIAGNOSIS — M6281 Muscle weakness (generalized): Secondary | ICD-10-CM | POA: Diagnosis not present

## 2021-03-20 DIAGNOSIS — R278 Other lack of coordination: Secondary | ICD-10-CM | POA: Diagnosis not present

## 2021-03-20 DIAGNOSIS — I69354 Hemiplegia and hemiparesis following cerebral infarction affecting left non-dominant side: Secondary | ICD-10-CM

## 2021-03-20 DIAGNOSIS — R1312 Dysphagia, oropharyngeal phase: Secondary | ICD-10-CM | POA: Diagnosis not present

## 2021-03-20 MED ORDER — HYDROXYZINE HCL 25 MG PO TABS: 25.0000 mg | ORAL_TABLET | Freq: Two times a day (BID) | ORAL | 2 refills | Status: DC | PRN

## 2021-03-20 NOTE — Telephone Encounter (Signed)
Why is he suddenly this agitated?  I went back and looked at hospital notes and don't see where the agitation was a significant issue. I'm not a big fan of him taking atarax multiple times each day.  I'll give him enough to take two per day. If that isn't enough, then we'll have to try additional medication such as propranolol

## 2021-03-20 NOTE — Therapy (Signed)
[facility] [address] [place], [place], [address] Phone: [phone]   Fax:  [phone]  Occupational Therapy Treatment  Patient Details  Name: Douglas Spencer MRN: [id number] Date of Birth: 1945-07-28 Referring Provider (OT): Dr Naaman Plummer   Encounter Date: 03/20/2021   OT End of Session - 03/20/21 0922     Visit Number 4    Number of Visits 17    Authorization Type [employer] Medicare    Progress Note Due on Visit 10    OT Start Time 0845    OT Stop Time 0930    OT Time Calculation (min) 45 min    Activity Tolerance Patient tolerated treatment well    Behavior During Therapy [facility] for tasks assessed/performed             Past Medical History:  Diagnosis Date   ETOH abuse    Substance abuse ([place])     History reviewed. No pertinent surgical history.  There were no vitals filed for this visit.   Subjective Assessment - 03/20/21 0847     Subjective  "i'm tired - my pants are falling down"    Currently in Pain? No/denies    Pain Score 0-No pain                          OT Treatments/Exercises (OP) - 03/20/21 0853       Visual/Perceptual Exercises   Copy this Image Pegboard    Pegboard medium pegs and pegboard design (green and red triangles) with image to the left for visual scanning and attention to the left in order to copy pattern. Pt started with use of RUE but was reminded to use LUE for increased coordination. Pt with max difficulty with coordination of manipulating medium pegs and downgraded to completing with RUE for focus on left attention and scanning. Completed copying pattern with 0 errors. Removed pegs with LUE.    Scanning Tabletop    Scanning - Tabletop digital/analog clocks with scanning on tabletop with very minimal verbal cues for attending to left and how to complete task                      OT Short Term Goals - 03/13/21 1403       OT SHORT TERM GOAL #1    Title Patient will complete an HEP designed to improve range of motion in LUE    Time 4    Period Weeks    Status On-going    Target Date 04/14/21      OT SHORT TERM GOAL #2   Title Patient will dress his upper body with increased time and min assistance using BUE - includes buttons/zippers    Time 4    Period Weeks    Status On-going      OT SHORT TERM GOAL #3   Title Patient will tie shoes with increased time using BUE    Time 4    Period Weeks    Status On-going      OT SHORT TERM GOAL #4   Title Patient will demonstrate 3 lb increase in grip strength in LUE    Time 4    Period Weeks    Status On-going               OT Long Term Goals - 03/13/21 1405       OT LONG TERM GOAL #1   Title Patient  will complete updated HEP designed to improve functional movement, strength and coordiantion in LUE    Time 8    Period Weeks    Status New    Target Date 05/14/21      OT LONG TERM GOAL #2   Title Patient will complete Box and Blocks - at last 20 blocks - LUE    Baseline 7    Time 8    Period Weeks    Status New      OT LONG TERM GOAL #3   Title Patient will demonstrate 10 lb increase in grip strength, and 3 lb increase in lateral pinch strength with LUE    Time 8    Period Weeks    Status New      OT LONG TERM GOAL #4   Title Patient will dress himself - includes fasteners, in reasonable amount ot itme, and using BUE    Time 8    Period Weeks    Status New      OT LONG TERM GOAL #5   Title Patient will visually scan on page to read full paragraph with environmental cueing    Time 8    Period Weeks    Status New                   Plan - 03/20/21 6144     Clinical Impression Statement Pt did well with visual scanning to the left today - continue to work on increased time with attention to the left    OT Occupational Profile and History Detailed Assessment- Review of Records and additional review of physical, cognitive, psychosocial history  related to current functional performance    Occupational performance deficits (Please refer to evaluation for details): ADL's;IADL's;Leisure    Body Structure / Function / Physical Skills ADL;Decreased knowledge of use of DME;Strength;Balance;Dexterity;GMC;Tone;Body mechanics;Proprioception;UE functional use;Endurance;IADL;ROM;Vestibular;Vision;Coordination;Flexibility;Mobility;Decreased knowledge of precautions;FMC    Cognitive Skills Attention;Problem Solve;Safety Awareness;Sequencing;Temperament/Personality;Learn;Thought;Memory;Understand;Orientation;Perception    Psychosocial Skills Coping Strategies    Rehab Potential Fair    Clinical Decision Making Several treatment options, min-mod task modification necessary    Comorbidities Affecting Occupational Performance: May have comorbidities impacting occupational performance    Modification or Assistance to Complete Evaluation  Min-Moderate modification of tasks or assist with assess necessary to complete eval    OT Frequency 2x / week    OT Duration 8 weeks    OT Treatment/Interventions Self-care/ADL training;Moist Heat;Fluidtherapy;DME and/or AE instruction;Splinting;Balance training;Aquatic Therapy;Therapeutic activities;Ultrasound;Therapeutic exercise;Cognitive remediation/compensation;Coping strategies training;Visual/perceptual remediation/compensation;Passive range of motion;Functional Mobility Training;Neuromuscular education;Electrical Stimulation;Manual Therapy;Patient/family education    Plan continue with LUE NMR, dynamic balance training    Consulted and Agree with Plan of Care Patient;Family member/caregiver             Patient will benefit from skilled therapeutic intervention in order to improve the following deficits and impairments:   Body Structure / Function / Physical Skills: ADL, Decreased knowledge of use of DME, Strength, Balance, Dexterity, GMC, Tone, Body mechanics, Proprioception, UE functional use, Endurance, IADL,  ROM, Vestibular, Vision, Coordination, Flexibility, Mobility, Decreased knowledge of precautions, FMC Cognitive Skills: Attention, Problem Solve, Safety Awareness, Sequencing, Temperament/Personality, Learn, Thought, Memory, Understand, Orientation, Perception Psychosocial Skills: Coping Strategies   Visit Diagnosis: Attention and concentration deficit  Visuospatial deficit  Hemiplegia and hemiparesis following cerebral infarction affecting left non-dominant side (HCC)  Muscle weakness (generalized)  Difficulty in walking, not elsewhere classified  Other lack of coordination    Problem List Patient Active Problem List   Diagnosis Date  Noted   Protein-calorie malnutrition, severe 02/06/2021   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

## 2021-03-20 NOTE — Therapy (Signed)
[facility] [address] [place], [place], [address] Phone: [phone]   Fax:  [phone]  Speech Language Pathology Treatment  Patient Details  Name: Douglas Spencer MRN: [id number] Date of Birth: 1945/08/19 Referring Provider (SLP): Cathlyn Parsons, PA-C   Encounter Date: 03/20/2021   End of Session - 03/20/21 0903     Visit Number 4    Number of Visits 25    Date for SLP Re-Evaluation 05/25/21    Authorization Type [employer]na Medicare    SLP Start Time 0930    SLP Stop Time  4239    SLP Time Calculation (min) 45 min    Activity Tolerance Patient tolerated treatment well             Past Medical History:  Diagnosis Date   ETOH abuse    Substance abuse ([place])     History reviewed. No pertinent surgical history.  There were no vitals filed for this visit.   Subjective Assessment - 03/20/21 0929     Subjective "I put on two right shoes this morning"    Currently in Pain? No/denies                   ADULT SLP TREATMENT - 03/20/21 0902       General Information   Behavior/Cognition Alert;Pleasant mood;Requires cueing;Lethargic   reported increased feelings of depression     Treatment Provided   Treatment provided Dysphagia      Dysphagia Treatment   Treatment Methods Therapeutic exercise;Compensation strategy training;Patient/caregiver education    Patient observed directly with PO's Yes    Type of PO's observed Thin liquids    Feeding Able to feed self    Liquids provided via Cup    Oral Phase Signs & Symptoms Oral holding    Type of cueing Verbal    Amount of cueing Minimal    Other treatment/comments Some delayed processing exhibited today in conversation. Pt reports a recent uptick in depressed feelings due to awaiting anxiety medication refill. Pt reportedly not completing swallow exercises very frequently due to feelings of depression. Pt occasionally Masako and effortful swallows  due to lack of "interest." Pt has reportedly been utilizing hard swallowing during mealtimes. SLP re-educated and targeted swallow exercises with focus on effortful swallows and Masako swallows. Pt completed 30 effortful swallows and 15 Masko swallows today with usual SLP encouragement and occasional min A. SLP also educated and instructed recommended swallow precautions as caregiver reportedly constantly cues slow rate and small bites. SLP provided education and handout to aid carryover at home. Pt verbalized understanding. No overt s/sx of aspiration exhibited with small, single cup sips of water today.      Assessment / Recommendations / Plan   Plan Continue with current plan of care      Progression Toward Goals   Progression toward goals Progressing toward goals - questionable carryover             SLP Education - 03/20/21 1011     Education Details safe swallow precautions, complete effortful swallows and Masko swallows at bare minimum per day    Person(s) Educated Patient    Methods Explanation;Demonstration;Verbal cues;Handout    Comprehension Verbalized understanding;Returned demonstration;Need further instruction              SLP Short Term Goals - 03/20/21 5320       SLP SHORT TERM GOAL #1   Title Pt will verbalize and demonstrate recommended swallow exercises with  occasional min A over 2 sessions    Time 3    Period Weeks    Status On-going      SLP SHORT TERM GOAL #2   Title Pt will complete dysphagia HEP as recommended for 4/5 days with occasional min A from caregiver    Time 3    Period Weeks    Status On-going      SLP SHORT TERM GOAL #3   Title Caregiver will assist with carryover of swallow exercise HEP and compensatory swallow strategies to reduce risk of aspiration given rare min A over 2 sessions    Time 3    Period Weeks    Status On-going      SLP SHORT TERM GOAL #4   Title Pt will identify errors and correct problems in functional and  structured scenarios given occasional min A over 2 sessions    Time 3    Period Weeks    Status On-going      SLP SHORT TERM GOAL #5   Title Pt will implement compensations to aid processing speed on structured and unstructured tasks with 80% accuracy over 2 sessions    Time 3    Period Weeks    Status On-going              SLP Long Term Goals - 03/20/21 0904       SLP LONG TERM GOAL #1   Title Pt and caregiver will implement recommended compensatory strategies to reduce risk of aspiration given rare min A over 2 sessions    Time 7    Period Weeks    Status On-going      SLP LONG TERM GOAL #2   Title Pt will participate in objective swallow study (as agreeable) to re-evaluate oropharyngeal dysphagia    Time 7    Period Weeks    Status On-going      SLP LONG TERM GOAL #3   Title Pt will demonstrate awareness of deficits and modify functional scenarios to ensure accurate and safe completion given rare min A over 2 sessions    Time 11    Period Weeks    Status On-going      SLP LONG TERM GOAL #4   Title Pt will demonstrate improved sustained attention and auditory processing with increased participation in iADL and ADLS given rare min A over 2 sessions    Time 11    Period Weeks    Status On-going              Plan - 03/20/21 0903     Clinical Impression Statement "[name]" was referred for OPST secondary to cerebral infarction on 01-30-21. Conducted ongoing education and training of swallow exercise HEP to target oral and pharyngeal deficits evidenced on most recent MBSS on 02-20-21. Limited carryover reported at home due to depression. SLP focused on implementation of effortful swallows and Masako swallows due to simply current routine to aid carryover. SLP educated and instructed recommended swallow strategies  to decrease risk of aspiration as pt has elected to consume mechanical soft/regular textures and thin liquids despite recommendation/education. Skilled ST is  warranted to address moderate oropharyngeal and mild cognitive impairment to optimize return to baseline and increase safety.    Speech Therapy Frequency 2x / week    Duration 12 weeks    Treatment/Interventions Compensatory strategies;Aspiration precaution training;Pharyngeal strengthening exercises;Cueing hierarchy;Functional tasks;Patient/family education;SLP instruction and feedback;Internal/external aids;Language facilitation;Compensatory techniques;Multimodal communcation approach    Potential to [place]  Potential Considerations Cooperation/participation level;Previous level of function;Ability to learn/carryover information;Family/community support    Consulted and Agree with Plan of Care Patient;Family member/caregiver             Patient will benefit from skilled therapeutic intervention in order to improve the following deficits and impairments:   Dysphagia, oropharyngeal phase    Problem List Patient Active Problem List   Diagnosis Date Noted   Protein-calorie malnutrition, severe 02/06/2021   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 [place], [place], [address] Phone: [phone]   Fax:  [phone]   Name: Calahan Pak MRN: [id number] Date of Birth: March 09, 1945

## 2021-03-20 NOTE — Patient Instructions (Addendum)
During mealtimes, I want you to practice SLOWING down and taking small bites/sips.   Think about chewing each bite of food 15-20 times (this will help your digestion and safety). We want you to SAVOR your food, not rush through it.   Consider how your environment is playing a role. If you are more focused on watching the television versus how you eat, chances are you are not eating as safely as possible. Consider turning down television or eating in a different location so you can focus on slowing down and taking small bites.   Try cutting up the food before giving it do Douglas Spencer if he not taking small bites.    If you aren't feeling up to it, at least try to complete 30-50 hard swallows and 20-30 tongue out swallows per day. Today you are already done ____ hard swallows and ____ tongue swallows.

## 2021-03-20 NOTE — Telephone Encounter (Signed)
I called back and spoke with the caregiver Mr Douglas Spencer. He says the pt is agitated because he is in a different environment. I reviewed his medications because he kept angrily saying he  just wants to be sure they will be there on Friday.  He then said insurance won't pay except every 30 days but he needs that medicine or 'he will die". I asked him to read me the fill dates on the bottles. His pill bottles had 02/20/21 on them so I tried to convince him there were new Rx at the pharmacy dated 03/05/21 but we have no control over when insurance will pay for them. He got angry and started yelling at me. I asked him to not raise his voice with me. At that time he went off and was yelling about the medications BETTER be there when he goes to pick them up and called me a "dumb bitch" and hung up on me.

## 2021-03-20 NOTE — Telephone Encounter (Signed)
That's sort of what Douglas Spencer said too, no mention of it at the [facility], but this caretaker has called multiple times.

## 2021-03-20 NOTE — Therapy (Signed)
[facility] [address] [place], [place], [address] Phone: [phone]   Fax:  [phone]  Physical Therapy Treatment  Patient Details  Name: Douglas Spencer MRN: [id number] Date of Birth: [birth year] Referring Provider (PT): Cathlyn Parsons, PA-C   Encounter Date: 03/20/2021   PT End of Session - 03/20/21 1019     Visit Number 4    Number of Visits 16    Date for PT Re-Evaluation 04/25/21    Authorization Type Aetna Medicare    Authorization - Visit Number 3    Progress Note Due on Visit 10    PT Start Time 2992    PT Stop Time 1100    PT Time Calculation (min) 45 min    Equipment Utilized During Treatment Gait belt    Activity Tolerance Patient tolerated treatment well    Behavior During Therapy WFL for tasks assessed/performed             Past Medical History:  Diagnosis Date   ETOH abuse    Substance abuse ([place])     No past surgical history on file.  There were no vitals filed for this visit.   Subjective Assessment - 03/20/21 1020     Subjective Pt reports nothing new or different. Had a good session with speech therapy. Thinks his arm is getting stronger.    Limitations Walking;Lifting    How long can you sit comfortably? n/a    How long can you stand comfortably? n/a    How long can you walk comfortably? Has not tried going to grocery store    Patient Stated Goals Regain all of his strength and be able to walk outside to the grocery store again                Douglas Spencer PT Assessment - 03/20/21 0001       Assessment   Medical Diagnosis I63.9 (ICD-10-CM) - Cerebral infarction, unspecified    Referring Provider (PT) Cathlyn Parsons, PA-C    Hand Dominance Right      Precautions   Precautions [place] Limbo Adult PT Treatment/Exercise - 03/20/21 0001       Knee/Hip Exercises: Aerobic   Nustep L6 x 6 min UEs/LEs      Knee/Hip Exercises:  Standing   Other Standing Knee Exercises in // bars: lateral steps with green tband around thighs x4 reps      Knee/Hip Exercises: Seated   Sit to Sand 2 sets;with UE support   staggered stance     Knee/Hip Exercises: Supine   Bridges Strengthening;2 sets;10 reps    Other Supine Knee/Hip Exercises marching with green tband 2x10                 Balance Exercises - 03/20/21 0001       Balance Exercises: Standing   Tandem Stance Eyes open;2 reps;30 secs   head turns and head nods x10 each   SLS with Vectors Solid surface    SLS with Vectors Limitations 2x10 tap forward and laterally each way on therastone                  PT Short Term Goals - 02/28/21 1511       PT SHORT TERM GOAL #1   Title Pt will be able to perfrom HEP with  SBA    Time 4    Period Weeks    Status New    Target Date 03/28/21      PT SHORT TERM GOAL #2   Title Pt will demo improved 5x STS to </=15 sec    Baseline 18 sec    Time 4    Period Weeks    Status New    Target Date 03/28/21      PT SHORT TERM GOAL #3   Title Pt will demo improved gait speed to at least 0.6 m/s for improved limited community amb    Baseline 0.4 m/s    Time 4    Period Weeks    Status New    Target Date 03/28/21      PT SHORT TERM GOAL #4   Title Pt will have improved Berg Balance Score to at least 45/56 to demo decrased fall risk    Baseline 43/56    Time 4    Period Weeks    Status New    Target Date 03/28/21               PT Long Term Goals - 02/28/21 1513       PT LONG TERM GOAL #1   Title Pt will be independent with maintaining exercises at home    Time 8    Period Weeks    Status New    Target Date 04/25/21      PT LONG TERM GOAL #2   Title Pt will be able to perform 5x STS in </=12 sec to demo increased functional LE strength    Time 8    Period Weeks    Status New    Target Date 04/25/21      PT LONG TERM GOAL #3   Title Pt will have improved FGA score to at least 26/30 to  demo decreased fall risk    Baseline 18/30    Time 8    Period Weeks    Status New    Target Date 04/25/21      PT LONG TERM GOAL #4   Title Pt will be able to amb to the grocery store (~10 min walk outside) with SBA to demo return to PLOF    Baseline Has not attempted    Time 8    Period Weeks    Status New    Target Date 04/25/21      PT LONG TERM GOAL #5   Title Pt will have improved FOTO score to at least 75    Baseline 65    Time 8    Period Weeks    Status New    Target Date 04/25/21                    Patient will benefit from skilled therapeutic intervention in order to improve the following deficits and impairments:     Visit Diagnosis: Hemiplegia and hemiparesis following cerebral infarction affecting left non-dominant side (HCC)  Muscle weakness (generalized)  Difficulty in walking, not elsewhere classified  Unsteadiness on feet  Other abnormalities of gait and mobility     Problem List Patient Active Problem List   Diagnosis Date Noted   Protein-calorie malnutrition, severe 02/06/2021   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

## 2021-03-23 ENCOUNTER — Ambulatory Visit: Payer: Medicare HMO

## 2021-03-23 ENCOUNTER — Ambulatory Visit: Payer: Medicare HMO | Admitting: Physical Therapy

## 2021-03-23 ENCOUNTER — Telehealth: Payer: Self-pay

## 2021-03-23 ENCOUNTER — Ambulatory Visit: Payer: Medicare HMO | Admitting: Occupational Therapy

## 2021-03-23 MED ORDER — LEVETIRACETAM 500 MG PO TABS: 500.0000 mg | ORAL_TABLET | Freq: Two times a day (BID) | ORAL | 2 refills | Status: DC

## 2021-03-23 NOTE — Telephone Encounter (Signed)
[facility] [address] does not [facility] the Vimpat in stock and will not have it until Monday. Informed the care giver and he stated they do not have a car, so he wanted to see if the [facility] [address] [facility] it. Called both pharmacies and they do not have it in stock either. Spoke with Danella Sensing, NP and she suggested I send to Dr. Naaman Plummer to see if he suggest any alternatives. Please advise

## 2021-03-23 NOTE — Addendum Note (Signed)
Addended by: Alger Simons T on: 03/23/2021 02:06 PM   Modules accepted: Orders

## 2021-03-23 NOTE — Telephone Encounter (Signed)
Spoke with patient's care giver and let him know that Vimpat was found at [facility] on [address]. He stated he would try to find a ride to the pharmacy as he does not have a car. I told him they only have 20 tablets, so we can send a prescription for a week and then have [facility] order it so it will be here next week. Verbalized understanding.

## 2021-03-23 NOTE — Telephone Encounter (Signed)
I spoke with caregiver. We will rx keppra which he took before. He had stopped it because it made him feel "weird". Rx sent to pharmacy for 500mg  bid.    Asked that they call [facility] for follow up and appt. There they can discuss other options to vimpat which they're having a hard time finding.

## 2021-03-27 ENCOUNTER — Ambulatory Visit: Payer: Medicare HMO

## 2021-03-27 ENCOUNTER — Other Ambulatory Visit: Payer: Self-pay

## 2021-03-27 ENCOUNTER — Ambulatory Visit: Payer: Medicare HMO | Admitting: Occupational Therapy

## 2021-03-27 ENCOUNTER — Encounter: Payer: Self-pay | Admitting: Occupational Therapy

## 2021-03-27 DIAGNOSIS — R278 Other lack of coordination: Secondary | ICD-10-CM | POA: Diagnosis not present

## 2021-03-27 DIAGNOSIS — R2689 Other abnormalities of gait and mobility: Secondary | ICD-10-CM | POA: Diagnosis not present

## 2021-03-27 DIAGNOSIS — R262 Difficulty in walking, not elsewhere classified: Secondary | ICD-10-CM

## 2021-03-27 DIAGNOSIS — R1312 Dysphagia, oropharyngeal phase: Secondary | ICD-10-CM | POA: Diagnosis not present

## 2021-03-27 DIAGNOSIS — R2681 Unsteadiness on feet: Secondary | ICD-10-CM

## 2021-03-27 DIAGNOSIS — I69354 Hemiplegia and hemiparesis following cerebral infarction affecting left non-dominant side: Secondary | ICD-10-CM | POA: Diagnosis not present

## 2021-03-27 DIAGNOSIS — M6281 Muscle weakness (generalized): Secondary | ICD-10-CM | POA: Diagnosis not present

## 2021-03-27 DIAGNOSIS — R41842 Visuospatial deficit: Secondary | ICD-10-CM | POA: Diagnosis not present

## 2021-03-27 DIAGNOSIS — R41840 Attention and concentration deficit: Secondary | ICD-10-CM | POA: Diagnosis not present

## 2021-03-27 NOTE — Patient Instructions (Signed)
At least focus on those 2 swallow exercises - hard swallow & tongue out swallow. Aim for 30-50 hard swallows and 20-30 tongue out swallows    Make sure you are brushing your dentures AND your mouth 2-3x times a day   Focus on how you are eating during your meals: take your time, chew it well, small bites/sips

## 2021-03-27 NOTE — Therapy (Signed)
[facility] [address] [place], [place], [address] Phone: [phone]   Fax:  [phone]  Speech Language Pathology Treatment  Patient Details  Name: Douglas Spencer MRN: [id number] Date of Birth: 1945-05-31 Referring Provider (SLP): Cathlyn Parsons, PA-C   Encounter Date: 03/27/2021   End of Session - 03/27/21 1530     Visit Number 5    Number of Visits 25    Date for SLP Re-Evaluation 05/25/21    Authorization Type [employer]    SLP Start Time 6468    SLP Stop Time  1611    SLP Time Calculation (min) 41 min    Activity Tolerance Patient tolerated treatment well             Past Medical History:  Diagnosis Date   ETOH abuse    Substance abuse ([place])     History reviewed. No pertinent surgical history.  There were no vitals filed for this visit.   Subjective Assessment - 03/27/21 1533     Subjective "I'm on Keppra. I hate it."    Currently in Pain? No/denies                   ADULT SLP TREATMENT - 03/27/21 1529       General Information   Behavior/Cognition Alert;Pleasant mood;Requires cueing;Lethargic;Distractible      Treatment Provided   Treatment provided Dysphagia      Dysphagia Treatment   Treatment Methods Therapeutic exercise;Compensation strategy training;Patient/caregiver education    Patient observed directly with PO's Yes    Type of PO's observed Thin liquids    Feeding Able to feed self    Liquids provided via Cup    Oral Phase Signs & Symptoms Oral holding    Pharyngeal Phase Signs & Symptoms Delayed cough;Immediate cough;Wet vocal quality    Type of cueing Verbal    Amount of cueing Minimal    Other treatment/comments Pt reported completion of ~50 hard swallows per day at mealtimes. Pt endorsed "not choking on water as much." With further prompting, limited carryover of SLP recommendations endorsed. SLP targeted swallow exercises of effortful swallow and  Masako, in whihc pt completed 19 effortful swallows and 4 Masako swallows with intermittent verbal and visual cues. Intermittent delayed coughing exhibited, in which SLP cued strong cough and intermittent throat clears due to wet vocal quality. SLP re-educated recommendations for swallow precautions and thorough oral care due to limited carryover. Pt was intermittently perseveratory on recent change in medication and his dislike of it.      Assessment / Recommendations / Plan   Plan Continue with current plan of care      Progression Toward Goals   Progression toward goals Not progressing toward goals (comment)   questionable carryover - distracted by medication change             SLP Education - 03/27/21 1645     Education Details swallow precautions, thorough oral care, swallow exercises    Person(s) Educated Patient    Methods Explanation;Demonstration;Handout;Verbal cues    Comprehension Verbalized understanding;Returned demonstration;Verbal cues required;Need further instruction              SLP Short Term Goals - 03/27/21 1532       SLP SHORT TERM GOAL #1   Title Pt will verbalize and demonstrate recommended swallow exercises with occasional min A over 2 sessions    Baseline 03-27-21 (Effortful swallow and Masako)    Time 2  Period Weeks    Status On-going      SLP SHORT TERM GOAL #2   Title Pt will complete dysphagia HEP as recommended for 4/5 days with occasional min A from caregiver    Time 2    Period Weeks    Status On-going      SLP SHORT TERM GOAL #3   Title Caregiver will assist with carryover of swallow exercise HEP and compensatory swallow strategies to reduce risk of aspiration given rare min A over 2 sessions    Time 2    Period Weeks    Status On-going      SLP SHORT TERM GOAL #4   Title Pt will identify errors and correct problems in functional and structured scenarios given occasional min A over 2 sessions    Time 2    Period Weeks    Status  On-going      SLP SHORT TERM GOAL #5   Title Pt will implement compensations to aid processing speed on structured and unstructured tasks with 80% accuracy over 2 sessions    Time 2    Period Weeks    Status On-going              SLP Long Term Goals - 03/27/21 1531       SLP LONG TERM GOAL #1   Title Pt and caregiver will implement recommended compensatory strategies to reduce risk of aspiration given rare min A over 2 sessions    Time 6    Period Weeks    Status On-going      SLP LONG TERM GOAL #2   Title Pt will participate in objective swallow study (as agreeable) to re-evaluate oropharyngeal dysphagia    Time 6    Period Weeks    Status On-going      SLP LONG TERM GOAL #3   Title Pt will demonstrate awareness of deficits and modify functional scenarios to ensure accurate and safe completion given rare min A over 2 sessions    Time 10    Period Weeks    Status On-going      SLP LONG TERM GOAL #4   Title Pt will demonstrate improved sustained attention and auditory processing with increased participation in iADL and ADLS given rare min A over 2 sessions    Time 10    Period Weeks    Status On-going              Plan - 03/27/21 1531     Clinical Impression Statement "Douglas Spencer" was referred for OPST secondary to cerebral infarction on 01-30-21. Conducted ongoing education and training of swallow exercise HEP to target oral and pharyngeal deficits evidenced on most recent MBSS on 02-20-21. Limited carryover reported at home due to disinterest and motivation. SLP focused on implementation of effortful swallows and Masako swallows due to simplify current routine to aid carryover. SLP educated and instructed recommended swallow strategies and thorough oral care to decrease risk of aspiration as pt has elected to consume mechanical soft/regular textures and thin liquids despite recommendation/education. Skilled ST is warranted to address moderate oropharyngeal and mild  cognitive impairment to optimize return to baseline and increase safety.    Speech Therapy Frequency 2x / week    Duration 12 weeks    Treatment/Interventions Compensatory strategies;Aspiration precaution training;Pharyngeal strengthening exercises;Cueing hierarchy;Functional tasks;Patient/family education;SLP instruction and feedback;Internal/external aids;Language facilitation;Compensatory techniques;Multimodal communcation approach    Potential to Achieve Goals Fair    Potential Considerations Cooperation/participation level;Previous level of  function;Ability to learn/carryover information;Family/community support    Consulted and Agree with Plan of Care Patient;Family member/caregiver             Patient will benefit from skilled therapeutic intervention in order to improve the following deficits and impairments:   Dysphagia, oropharyngeal phase    Problem List Patient Active Problem List   Diagnosis Date Noted   Protein-calorie malnutrition, severe 02/06/2021   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 [place], [place], [address] Phone: [phone]   Fax:  [phone]   Name: Douglas Spencer MRN: [id number] Date of Birth: 10/19/1945

## 2021-03-27 NOTE — Patient Instructions (Signed)
SELF ASSISTED WITH OBJECT:  ? ?Shoulder Flexion - Supine ? ? ? ?Hold cane with both hands. Raise arms overhead, keep elbows straight. 10_ reps per set, 2-3 sets per day, _5_ days per week ? ?Cane Horizontal - Supine ? ? ? ?With straight arms holding cane above shoulders, bring cane out to right, center, out to left, and back to above head. ?Repeat 10_ times. Do 2-3_ times per day ?Copyright ? V[employer]. All rights reserved.  ? ?Supine: Chest Press (Active) ? ? ? ?Lie on back with arms fully extended. Lower bar or dowel slowly to chest and press to arm's length. Use cane ?Complete 3 sets of _10 repetitions.  ? ?Shoulder: Abduction (Supine) ? ? ? ?With right arm flat on floor, hold dowel in palm. ?Slowly move arm up to side of head by pushing with opposite arm. Do not let elbow bend. ?Hold _10_ seconds. Repeat  10_ times. Do 3_ sessions per day. ?CAUTION: Stretch slowly and gently. ? ?Copyright ? VHI. All rights reserved.   ?

## 2021-03-27 NOTE — Therapy (Signed)
[facility] [address] [place], [place], [address] Phone: [phone]   Fax:  [phone]  Occupational Therapy Treatment  Patient Details  Name: Douglas Spencer MRN: [id number] Date of Birth: [birth year] Referring Provider (OT): Dr Naaman Plummer   Encounter Date: 03/27/2021   OT End of Session - 03/27/21 1403     Visit Number 5    Number of Visits 17    Authorization Type [employer] Medicare    Progress Note Due on Visit 10    OT Start Time 1400    OT Stop Time 1445    OT Time Calculation (min) 45 min    Activity Tolerance Patient tolerated treatment well    Behavior During Therapy [facility] for tasks assessed/performed             Past Medical History:  Diagnosis Date   ETOH abuse    Substance abuse ([place])     History reviewed. No pertinent surgical history.  There were no vitals filed for this visit.   Subjective Assessment - 03/27/21 1403     Subjective  "it's ok i haven't been doing anything - i was sick last week"    Currently in Pain? No/denies    Pain Score 0-No pain                          OT Treatments/Exercises (OP) - 03/27/21 0001       Neurological Re-education Exercises   Development of Reach Ball stretches    Ball Stretches x 10 reaching from seated edge of mat, horizontal abduction      Functional Reaching Activities   Low Level reaching and placing yellow and red clothespins at low level with LUE with min/mod difficulty. Pt placed yellow clothespins on antenna with extended higher reach with good reaching and mod difficulty with sustained pinch. LUE                    OT Education - 03/27/21 1413     Education Details supine foam roller exercises HEP    Person(s) Educated Patient    Methods Explanation;Handout;Demonstration    Comprehension Verbalized understanding;Returned demonstration              OT Short Term Goals - 03/13/21 1403       OT SHORT  TERM GOAL #1   Title Patient will complete an HEP designed to improve range of motion in LUE    Time 4    Period Weeks    Status On-going    Target Date 04/14/21      OT SHORT TERM GOAL #2   Title Patient will dress his upper body with increased time and min assistance using BUE - includes buttons/zippers    Time 4    Period Weeks    Status On-going      OT SHORT TERM GOAL #3   Title Patient will tie shoes with increased time using BUE    Time 4    Period Weeks    Status On-going      OT SHORT TERM GOAL #4   Title Patient will demonstrate 3 lb increase in grip strength in LUE    Time 4    Period Weeks    Status On-going               OT Long Term Goals - 03/13/21 1405       OT LONG TERM GOAL #1  Title Patient will complete updated HEP designed to improve functional movement, strength and coordiantion in LUE    Time 8    Period Weeks    Status New    Target Date 05/14/21      OT LONG TERM GOAL #2   Title Patient will complete Box and Blocks - at last 20 blocks - LUE    Baseline 7    Time 8    Period Weeks    Status New      OT LONG TERM GOAL #3   Title Patient will demonstrate 10 lb increase in grip strength, and 3 lb increase in lateral pinch strength with LUE    Time 8    Period Weeks    Status New      OT LONG TERM GOAL #4   Title Patient will dress himself - includes fasteners, in reasonable amount ot itme, and using BUE    Time 8    Period Weeks    Status New      OT LONG TERM GOAL #5   Title Patient will visually scan on page to read full paragraph with environmental cueing    Time 8    Period Weeks    Status New                   Plan - 03/27/21 1450     Clinical Impression Statement Pt continues to progress with LUE reach and encouraged to use LUE for more functional tasks    OT Occupational Profile and History Detailed Assessment- Review of Records and additional review of physical, cognitive, psychosocial history related to  current functional performance    Occupational performance deficits (Please refer to evaluation for details): ADL's;IADL's;Leisure    Body Structure / Function / Physical Skills ADL;Decreased knowledge of use of DME;Strength;Balance;Dexterity;GMC;Tone;Body mechanics;Proprioception;UE functional use;Endurance;IADL;ROM;Vestibular;Vision;Coordination;Flexibility;Mobility;Decreased knowledge of precautions;FMC    Cognitive Skills Attention;Problem Solve;Safety Awareness;Sequencing;Temperament/Personality;Learn;Thought;Memory;Understand;Orientation;Perception    Psychosocial Skills Coping Strategies    Rehab Potential Fair    Clinical Decision Making Several treatment options, min-mod task modification necessary    Comorbidities Affecting Occupational Performance: May have comorbidities impacting occupational performance    Modification or Assistance to Complete Evaluation  Min-Moderate modification of tasks or assist with assess necessary to complete eval    OT Frequency 2x / week    OT Duration 8 weeks    OT Treatment/Interventions Self-care/ADL training;Moist Heat;Fluidtherapy;DME and/or AE instruction;Splinting;Balance training;Aquatic Therapy;Therapeutic activities;Ultrasound;Therapeutic exercise;Cognitive remediation/compensation;Coping strategies training;Visual/perceptual remediation/compensation;Passive range of motion;Functional Mobility Training;Neuromuscular education;Electrical Stimulation;Manual Therapy;Patient/family education    Plan functional tasks with LUE    Consulted and Agree with Plan of Care Patient;Family member/caregiver             Patient will benefit from skilled therapeutic intervention in order to improve the following deficits and impairments:   Body Structure / Function / Physical Skills: ADL, Decreased knowledge of use of DME, Strength, Balance, Dexterity, GMC, Tone, Body mechanics, Proprioception, UE functional use, Endurance, IADL, ROM, Vestibular, Vision,  Coordination, Flexibility, Mobility, Decreased knowledge of precautions, FMC Cognitive Skills: Attention, Problem Solve, Safety Awareness, Sequencing, Temperament/Personality, Learn, Thought, Memory, Understand, Orientation, Perception Psychosocial Skills: Coping Strategies   Visit Diagnosis: Visuospatial deficit  Muscle weakness (generalized)  Unsteadiness on feet  Other abnormalities of gait and mobility  Attention and concentration deficit    Problem List Patient Active Problem List   Diagnosis Date Noted   Protein-calorie malnutrition, severe 02/06/2021   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

## 2021-03-27 NOTE — Therapy (Signed)
[facility] [address] [place], [place], [address] Phone: [phone]   Fax:  [phone]  Physical Therapy Treatment  Patient Details  Name: Douglas Spencer MRN: [id number] Date of Birth: December 13, 1945 Referring Provider (PT): Cathlyn Parsons, PA-C   Encounter Date: 03/27/2021   PT End of Session - 03/27/21 1447     Visit Number 5    Number of Visits 16    Date for PT Re-Evaluation 04/25/21    Authorization Type [employer]na Medicare    Authorization - Visit Number 4    Progress Note Due on Visit 10    PT Start Time 7989    PT Stop Time 1530    PT Time Calculation (min) 45 min    Equipment Utilized During Treatment Gait belt    Activity Tolerance Patient tolerated treatment well    Behavior During Therapy WFL for tasks assessed/performed             Past Medical History:  Diagnosis Date   ETOH abuse    Substance abuse ([place])     History reviewed. No pertinent surgical history.  There were no vitals filed for this visit.   Subjective Assessment - 03/27/21 1448     Subjective Pt reports he started taking Keppra and he hates taking it because it makes him sleepy all day long. He is trying to find replacement for Kepprabut replacement is more expensive.    Limitations Walking;Lifting    How long can you sit comfortably? n/a    How long can you stand comfortably? n/a    How long can you walk comfortably? Has not tried going to grocery store    Patient Stated Goals Regain all of his strength and be able to walk outside to the grocery store again                [facility] PT Assessment - 03/27/21 1519       Standardized Balance Assessment   Standardized Balance Assessment 10 meter walk test    10 Meter Walk 0.9ms no AD      Berg Balance Test   Sit to Stand Able to stand without using hands and stabilize independently    Standing Unsupported Able to stand safely 2 minutes    Sitting with Back  Unsupported but Feet Supported on Floor or Stool Able to sit safely and securely 2 minutes    Stand to Sit Sits safely with minimal use of hands    Transfers Able to transfer safely, minor use of hands    Standing Unsupported with Eyes Closed Able to stand 10 seconds with supervision    Standing Unsupported with Feet Together Able to place feet together independently and stand 1 minute safely    From Standing, Reach Forward with Outstretched Arm Can reach forward >12 cm safely (5")    From Standing Position, Pick up Object from Floor Able to pick up shoe safely and easily    From Standing Position, Turn to Look Behind Over each Shoulder Looks behind from both sides and weight shifts well    Turn 360 Degrees Able to turn 360 degrees safely but slowly    Standing Unsupported, Alternately Place Feet on Step/Stool Able to stand independently and safely and complete 8 steps in 20 seconds    Standing Unsupported, One Foot in Front Able to place foot tandem independently and hold 30 seconds    Standing on One Leg Able to lift leg independently and hold >  10 seconds    Total Score 52    Berg comment: 52/56                  Gait training: 1 x 875' with SBA  Toe walking: 3 x 10 feet Heel walking: 3 x 10 feet                     PT Short Term Goals - 03/27/21 1446       PT SHORT TERM GOAL #1   Title Pt will be able to perfrom HEP with SBA    Time 4    Period Weeks    Status New    Target Date 03/28/21      PT SHORT TERM GOAL #2   Title Pt will demo improved 5x STS to </=15 sec    Baseline 18 sec (eval); 13.69 sec (03/27/21)    Time 4    Period Weeks    Status Achieved    Target Date 03/28/21      PT SHORT TERM GOAL #3   Title Pt will demo improved gait speed to at least 0.6 m/s for improved limited community amb    Baseline 0.4 m/s; 0.80 m/s (03/27/21)    Time 4    Period Weeks    Status Achieved    Target Date 03/28/21      PT SHORT TERM GOAL #4   Title  Pt will have improved Berg Balance Score to at least 45/56 to demo decrased fall risk    Baseline 43/56 (eval); 52/56 (03/27/21)    Time 4    Period Weeks    Status Achieved    Target Date 03/28/21               PT Long Term Goals - 03/27/21 1507       PT LONG TERM GOAL #1   Title Pt will be independent with maintaining exercises at home    Time 8    Period Weeks    Status New    Target Date 04/25/21      PT LONG TERM GOAL #2   Title Pt will be able to perform 5x STS in </=12 sec to demo increased functional LE strength    Baseline 13.69 sec (03/27/21)    Time 8    Period Weeks    Status Partially Met    Target Date 04/25/21      PT LONG TERM GOAL #3   Title Pt will have improved FGA score to at least 26/30 to demo decreased fall risk    Baseline 18/30    Time 8    Period Weeks    Status On-going    Target Date 04/25/21      PT LONG TERM GOAL #4   Title Pt will be able to amb to the grocery store (~10 min walk outside) with SBA to demo return to PLOF    Baseline Has not attempted, able to ambulate 875' in 8' with SBA without AD    Time 8    Period Weeks    Status On-going    Target Date 04/25/21      PT LONG TERM GOAL #5   Title Pt will have improved FOTO score to at least 75    Baseline 65    Time 8    Period Weeks    Status New    Target Date 04/25/21  Plan - 03/27/21 1508     Clinical Impression Statement Pt has significant improvement in his BBS, gait speed and 5x sit to stand score. Patient has met all of his short term goals and progressing towards long term goals. Pt able to ambulate 875' without AD and SBA without any significant short ness of breath or fatigue. Pt needed cues to incorporate L UE swing during walking to improve balance with walking.    Personal Factors and Comorbidities Age;Time since onset of injury/illness/exacerbation;Past/Current Experience    Examination-Activity Limitations Locomotion  Level;Squat;Transfers    Examination-Participation Restrictions Community Activity;Yard Work    [employer] Evolving/Moderate complexity    Rehab Potential Good    PT Frequency 2x / week    PT Duration 8 weeks    PT Treatment/Interventions ADLs/Self Care Home Management;Aquatic Therapy;DME Instruction;Gait training;Stair training;Functional mobility training;Therapeutic activities;Therapeutic exercise;Balance training;Neuromuscular re-education;Manual techniques;Patient/family education;Dry needling;Orthotic Fit/Training;Taping;Vestibular    PT Next Visit Plan Has pt been practicing heel strike/arm swing during gait? Initiate strengthening and balance HEP. Work on narrow BOS, compliant surfaces, L LE stability.    PT Home Exercise Plan Pt to practice heel strike and arm swing    Consulted and Agree with Plan of Care Patient;Family member/caregiver    Family Member Consulted Caregiver, [name]             Patient will benefit from skilled therapeutic intervention in order to improve the following deficits and impairments:  Abnormal gait, Difficulty walking, Impaired UE functional use, Decreased endurance, Decreased activity tolerance, Decreased balance, Decreased mobility, Decreased strength  Visit Diagnosis: Muscle weakness (generalized)  Unsteadiness on feet  Other abnormalities of gait and mobility  Difficulty in walking, not elsewhere classified     Problem List Patient Active Problem List   Diagnosis Date Noted   Protein-calorie malnutrition, severe 02/06/2021   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 [place], [place], [address] Phone: [phone]   Fax:  [phone]  Name: Douglas Spencer MRN: [id number] Date of Birth: 1945-02-14

## 2021-03-29 ENCOUNTER — Ambulatory Visit: Payer: Medicare HMO

## 2021-03-29 ENCOUNTER — Ambulatory Visit: Payer: Medicare HMO | Admitting: Occupational Therapy

## 2021-04-03 ENCOUNTER — Ambulatory Visit: Payer: Medicare HMO | Admitting: Physical Therapy

## 2021-04-03 ENCOUNTER — Encounter: Payer: Self-pay | Admitting: Physical Therapy

## 2021-04-03 ENCOUNTER — Ambulatory Visit: Payer: Medicare HMO

## 2021-04-03 ENCOUNTER — Encounter: Payer: Self-pay | Admitting: Occupational Therapy

## 2021-04-03 ENCOUNTER — Ambulatory Visit: Payer: Medicare HMO | Admitting: Occupational Therapy

## 2021-04-03 ENCOUNTER — Other Ambulatory Visit: Payer: Self-pay

## 2021-04-03 DIAGNOSIS — R41842 Visuospatial deficit: Secondary | ICD-10-CM | POA: Diagnosis not present

## 2021-04-03 DIAGNOSIS — M6281 Muscle weakness (generalized): Secondary | ICD-10-CM

## 2021-04-03 DIAGNOSIS — R2689 Other abnormalities of gait and mobility: Secondary | ICD-10-CM

## 2021-04-03 DIAGNOSIS — R41840 Attention and concentration deficit: Secondary | ICD-10-CM

## 2021-04-03 DIAGNOSIS — I69354 Hemiplegia and hemiparesis following cerebral infarction affecting left non-dominant side: Secondary | ICD-10-CM | POA: Diagnosis not present

## 2021-04-03 DIAGNOSIS — R2681 Unsteadiness on feet: Secondary | ICD-10-CM

## 2021-04-03 DIAGNOSIS — R278 Other lack of coordination: Secondary | ICD-10-CM

## 2021-04-03 DIAGNOSIS — R1312 Dysphagia, oropharyngeal phase: Secondary | ICD-10-CM | POA: Diagnosis not present

## 2021-04-03 DIAGNOSIS — R262 Difficulty in walking, not elsewhere classified: Secondary | ICD-10-CM | POA: Diagnosis not present

## 2021-04-03 NOTE — Patient Instructions (Signed)
11. Grip Strengthening (Resistive Putty)   Squeeze putty using thumb and all fingers. Repeat 15 times. Do 1-2 sessions per day.   Extension (Assistive Putty)   Roll putty back and forth, being sure to use all fingertips. Repeat 3 times. Do 1-2 sessions per day.  Then pinch as below.   Palmar Pinch Strengthening (Resistive Putty)   Pinch putty between thumb and each fingertip in turn after rolling out    Finger and Thumb Extension (Resistive Putty)   With thumb and all fingers in center of putty donut, stretch out. Repeat 5-10 times. Do 1-2 sessions per day.     Lateral Pinch Strengthening (Resistive Putty)    Squeeze between thumb and side of each finger in turn. Repeat 10 times. Do 1-2 sessions per day.          Adduction (Resistive Putty)    Press between 2 fingers and pull putty anchored with other hand. Repeat with all fingers. Repeat 5 times. Do 1-2 sessions per day.         Thumb Extensors    Straighten right thumb inside putty loop anchored by fingers. Repeat 10 times. Do 1-2 sessions per day. Activity: Shoot marbles using thumb.*

## 2021-04-03 NOTE — Therapy (Signed)
[facility] [address] [place], [place], [address] Phone: [phone]   Fax:  [phone]  Physical Therapy Treatment  Patient Details  Name: Douglas Spencer MRN: [id number] Date of Birth: Feb 16, 1945 Referring Provider (PT): Cathlyn Parsons, PA-C   Encounter Date: 04/03/2021   PT End of Session - 04/03/21 1405     Visit Number 6    Number of Visits 16    Date for PT Re-Evaluation 04/25/21    Authorization Type [employer] Medicare    Authorization - Visit Number 4    Progress Note Due on Visit 10    PT Start Time 1400    PT Stop Time 1444    PT Time Calculation (min) 44 min    Equipment Utilized During Treatment Gait belt    Activity Tolerance Patient tolerated treatment well    Behavior During Therapy WFL for tasks assessed/performed             Past Medical History:  Diagnosis Date   ETOH abuse    Substance abuse ([place])     History reviewed. No pertinent surgical history.  There were no vitals filed for this visit.   Subjective Assessment - 04/03/21 1401     Subjective Pt states he spoke to MD about dislike of Keppra and that the doctor switched him to generic version on Monday.  He expresses liking this version better.    Patient is accompained by: --    Limitations Walking;Lifting    How long can you sit comfortably? n/a    How long can you stand comfortably? n/a    How long can you walk comfortably? Has not tried going to grocery store    Patient Stated Goals Regain all of his strength and be able to walk outside to the grocery store again    Currently in Pain? No/denies                               [facility] Adult PT Treatment/Exercise - 04/03/21 1450       Ambulation/Gait   Ambulation/Gait Yes    Ambulation/Gait Assistance 5: Supervision    Ambulation/Gait Assistance Details Cuing for arm swing, weight shift and swing through of left foot.  Difficulty scanning left  environment during gait, cued for attention to obstacles.    Ambulation Distance (Feet) 690 Feet    Assistive device None    Gait Pattern Decreased arm swing - left;Decreased weight shift to right;Decreased weight shift to left;Poor foot clearance - left;Step-through pattern    Ambulation Surface Level;Indoor      Neuro Re-ed    Neuro Re-ed Details  Pt performs 3 trials of forward stepping over mat surface with 4 subsequent trials of retrostepping with pelvic facilitation to promote glut activation on left.  Lateral step outs to yellow dot and 4-way stepping using mirror 2x10 for visual feedback to encourage increased left step size.  Star taps to colored dots  2x10 each side to encourage stepping strategy and improved weight shifting during SLS.  In // bars pt performs 2 trials of retrostepping w/ BUE support, demonstrating improved left swing through.  Pt stands on airex EO 2x30 sec> narrow BOS EO 2x30 sec > normal BOS EC x16 sec and 25 sec w/ left posterior LOB in both attempts.  PT Short Term Goals - 03/27/21 1446       PT SHORT TERM GOAL #1   Title Pt will be able to perfrom HEP with SBA    Time 4    Period Weeks    Status New    Target Date 03/28/21      PT SHORT TERM GOAL #2   Title Pt will demo improved 5x STS to </=15 sec    Baseline 18 sec (eval); 13.69 sec (03/27/21)    Time 4    Period Weeks    Status Achieved    Target Date 03/28/21      PT SHORT TERM GOAL #3   Title Pt will demo improved gait speed to at least 0.6 m/s for improved limited community amb    Baseline 0.4 m/s; 0.80 m/s (03/27/21)    Time 4    Period Weeks    Status Achieved    Target Date 03/28/21      PT SHORT TERM GOAL #4   Title Pt will have improved Berg Balance Score to at least 45/56 to demo decrased fall risk    Baseline 43/56 (eval); 52/56 (03/27/21)    Time 4    Period Weeks    Status Achieved    Target Date 03/28/21               PT Long Term Goals  - 03/27/21 1507       PT LONG TERM GOAL #1   Title Pt will be independent with maintaining exercises at home    Time 8    Period Weeks    Status New    Target Date 04/25/21      PT LONG TERM GOAL #2   Title Pt will be able to perform 5x STS in </=12 sec to demo increased functional LE strength    Baseline 13.69 sec (03/27/21)    Time 8    Period Weeks    Status Partially Met    Target Date 04/25/21      PT LONG TERM GOAL #3   Title Pt will have improved FGA score to at least 26/30 to demo decreased fall risk    Baseline 18/30    Time 8    Period Weeks    Status On-going    Target Date 04/25/21      PT LONG TERM GOAL #4   Title Pt will be able to amb to the grocery store (~10 min walk outside) with SBA to demo return to PLOF    Baseline Has not attempted, able to ambulate 875' in 8' with SBA without AD    Time 8    Period Weeks    Status On-going    Target Date 04/25/21      PT LONG TERM GOAL #5   Title Pt will have improved FOTO score to at least 75    Baseline 65    Time 8    Period Weeks    Status New    Target Date 04/25/21                   Plan - 04/03/21 1501     Clinical Impression Statement Pt continues to do well with endurance task ambulating 690' without SOB or fatigue.  Able to progress balance training over compliant surfaces, with narrow base of support, and eyes closed today.  Pt remains limited by decreased attention to task and LLE weakness evident during limb advancement and placement  for standing activities.    Personal Factors and Comorbidities Age;Time since onset of injury/illness/exacerbation;Past/Current Experience    Examination-Activity Limitations Locomotion Level;Squat;Transfers    Examination-Participation Restrictions Community Activity;Yard Work    [employer] Evolving/Moderate complexity    Rehab Potential Good    PT Frequency 2x / week    PT Duration 8 weeks    PT Treatment/Interventions ADLs/Self  Care Home Management;Aquatic Therapy;DME Instruction;Gait training;Stair training;Functional mobility training;Therapeutic activities;Therapeutic exercise;Balance training;Neuromuscular re-education;Manual techniques;Patient/family education;Dry needling;Orthotic Fit/Training;Taping;Vestibular    PT Next Visit Plan Has pt been practicing heel strike/arm swing during gait? Initiate strengthening and balance HEP. Work on narrow BOS, compliant surfaces, L LE stability, obstacle navigation, hurdles.    PT Home Exercise Plan Pt to practice heel strike and arm swing    Consulted and Agree with Plan of Care Patient;Family member/caregiver    Family Member Consulted Caregiver, [name]             Patient will benefit from skilled therapeutic intervention in order to improve the following deficits and impairments:  Abnormal gait, Difficulty walking, Impaired UE functional use, Decreased endurance, Decreased activity tolerance, Decreased balance, Decreased mobility, Decreased strength  Visit Diagnosis: Muscle weakness (generalized)  Unsteadiness on feet  Other abnormalities of gait and mobility     Problem List Patient Active Problem List   Diagnosis Date Noted   Protein-calorie malnutrition, severe 02/06/2021   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

## 2021-04-03 NOTE — Patient Instructions (Addendum)
If you hear that change in your voice quality (wet/gurgly), clear your throat or cough then swallow hard.   Think of best times to complete your swallow exercises:  -maybe take your meds, eat breakfast, and THEN do your swallow exercises  -do this same routine both morning and evening   **Try counting to 3 to trigger your swallow when you are doing the Masako swallow  **Do NOT swallow water while doing the Masako (tongue out) swallow. This is too hard to coordinate.

## 2021-04-03 NOTE — Therapy (Signed)
[facility] [address] [place], [place], [address] Phone: [phone]   Fax:  [phone]  Occupational Therapy Treatment  Patient Details  Name: Douglas Spencer MRN: [id number] Date of Birth: Oct 02, 1945 Referring Provider (OT): Dr Naaman Plummer   Encounter Date: 04/03/2021   OT End of Session - 04/03/21 1625     Visit Number 6    Number of Visits 17    Authorization Type Aetna Medicare    Progress Note Due on Visit 10    OT Start Time 1530    OT Stop Time 1615    OT Time Calculation (min) 45 min    Activity Tolerance Patient tolerated treatment well    Behavior During Therapy [facility] for tasks assessed/performed             Past Medical History:  Diagnosis Date   ETOH abuse    Substance abuse ([place])     History reviewed. No pertinent surgical history.  There were no vitals filed for this visit.   Subjective Assessment - 04/03/21 1537     Subjective  I am doing better, but everything is still slow    Currently in Pain? No/denies    Pain Score 0-No pain                [facility] OT Assessment - 04/03/21 0001       Hand Function   Left Hand Gross Grasp Impaired    Left Hand Grip (lbs) 10.5    Left Hand Lateral Pinch 6 lbs                      OT Treatments/Exercises (OP) - 04/03/21 0001       ADLs   UB Dressing Patient is approaching goals in clinic, but needs to have this translate to function at home for ST Goal achievement.    Medication Management Patient did report that he has moved off of [place] for seizure prevention - stating this medication made him feel sleepy and "out of it"  Patient reports current medication does not have that affect.    ADL Comments Started checking short term goals.  Patient is reporting that he can dress himself with less assistance.  Reports - "I am lazy, and if the help is there, I take it."  Long discussion about the importance of doing as much as possible  for himself and that it will take longer initially but with repetition this should improve.      Theraputty   Theraputty - Grip yellow    Theraputty - Pinch yellow    Theraputty Hand- Locate Pegs x 10 with frequent cueing for use of LUE                    OT Education - 04/03/21 1625     Education Details Putty exercises for left hand    Person(s) Educated Patient    Methods Explanation;Demonstration;Tactile cues;Verbal cues;Handout    Comprehension Returned demonstration;Need further instruction;Tactile cues required              OT Short Term Goals - 04/03/21 1539       OT SHORT TERM GOAL #1   Title Patient will complete an HEP designed to improve range of motion in LUE    Time 4    Period Weeks    Status On-going    Target Date 04/14/21      OT SHORT TERM GOAL #2  Title Patient will dress his upper body with increased time and min assistance using BUE - includes buttons/zippers    Time 4    Period Weeks    Status On-going      OT SHORT TERM GOAL #3   Title Patient will tie shoes with increased time using BUE    Time 4    Period Weeks    Status On-going      OT SHORT TERM GOAL #4   Title Patient will demonstrate 3 lb increase in grip strength in LUE    Time 4    Period Weeks    Status On-going               OT Long Term Goals - 04/03/21 1626       OT LONG TERM GOAL #1   Title Patient will complete updated HEP designed to improve functional movement, strength and coordiantion in LUE    Time 8    Period Weeks    Status On-going    Target Date 05/14/21      OT LONG TERM GOAL #2   Title Patient will complete Box and Blocks - at last 20 blocks - LUE    Baseline 7    Time 8    Period Weeks    Status On-going      OT LONG TERM GOAL #3   Title Patient will demonstrate 10 lb increase in grip strength, and 3 lb increase in lateral pinch strength with LUE    Time 8    Period Weeks    Status On-going      OT LONG TERM GOAL #4   Title  Patient will dress himself - includes fasteners, in reasonable amount ot itme, and using BUE    Time 8    Period Weeks    Status On-going      OT LONG TERM GOAL #5   Title Patient will visually scan on page to read full paragraph with environmental cueing    Time 8    Period Weeks    Status On-going                   Plan - 04/03/21 1626     Clinical Impression Statement Pt continues to progress with LUE reach and encouraged to use LUE for more functional tasks    OT Occupational Profile and History Detailed Assessment- Review of Records and additional review of physical, cognitive, psychosocial history related to current functional performance    Occupational performance deficits (Please refer to evaluation for details): ADL's;IADL's;Leisure    Body Structure / Function / Physical Skills ADL;Decreased knowledge of use of DME;Strength;Balance;Dexterity;GMC;Tone;Body mechanics;Proprioception;UE functional use;Endurance;IADL;ROM;Vestibular;Vision;Coordination;Flexibility;Mobility;Decreased knowledge of precautions;FMC    Cognitive Skills Attention;Problem Solve;Safety Awareness;Sequencing;Temperament/Personality;Learn;Thought;Memory;Understand;Orientation;Perception    Psychosocial Skills Coping Strategies    Rehab Potential Fair    Clinical Decision Making Several treatment options, min-mod task modification necessary    Comorbidities Affecting Occupational Performance: May have comorbidities impacting occupational performance    Modification or Assistance to Complete Evaluation  Min-Moderate modification of tasks or assist with assess necessary to complete eval    OT Frequency 2x / week    OT Duration 8 weeks    OT Treatment/Interventions Self-care/ADL training;Moist Heat;Fluidtherapy;DME and/or AE instruction;Splinting;Balance training;Aquatic Therapy;Therapeutic activities;Ultrasound;Therapeutic exercise;Cognitive remediation/compensation;Coping strategies  training;Visual/perceptual remediation/compensation;Passive range of motion;Functional Mobility Training;Neuromuscular education;Electrical Stimulation;Manual Therapy;Patient/family education    Plan functional tasks with LUE    Consulted and Agree with Plan of Care Patient;Family member/caregiver  Patient will benefit from skilled therapeutic intervention in order to improve the following deficits and impairments:   Body Structure / Function / Physical Skills: ADL, Decreased knowledge of use of DME, Strength, Balance, Dexterity, GMC, Tone, Body mechanics, Proprioception, UE functional use, Endurance, IADL, ROM, Vestibular, Vision, Coordination, Flexibility, Mobility, Decreased knowledge of precautions, [place] Cognitive Skills: Attention, Problem Solve, Safety Awareness, Sequencing, Temperament/Personality, Learn, Thought, Memory, Understand, Orientation, Perception Psychosocial Skills: Coping Strategies   Visit Diagnosis: Visuospatial deficit  Attention and concentration deficit  Hemiplegia and hemiparesis following cerebral infarction affecting left non-dominant side (HCC)  Other lack of coordination  Unsteadiness on feet  Muscle weakness (generalized)    Problem List Patient Active Problem List   Diagnosis Date Noted   Protein-calorie malnutrition, severe 02/06/2021   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

## 2021-04-03 NOTE — Therapy (Signed)
[facility] [address] [place], [place], [address] Phone: [phone]   Fax:  [phone]  Speech Language Pathology Treatment  Patient Details  Name: Douglas Spencer MRN: [id number] Date of Birth: 12-30-1945 Referring Provider (SLP): Cathlyn Parsons, PA-C   Encounter Date: 04/03/2021   End of Session - 04/03/21 1442     Visit Number 6    Number of Visits 25    Date for SLP Re-Evaluation 05/25/21    Authorization Type Aetna Medicare    SLP Start Time 1448    SLP Stop Time  1530    SLP Time Calculation (min) 42 min    Activity Tolerance Patient tolerated treatment well             Past Medical History:  Diagnosis Date   ETOH abuse    Substance abuse ([place])     No past surgical history on file.  There were no vitals filed for this visit.   Subjective Assessment - 04/03/21 1440     Subjective "Better" re: swallow function    Currently in Pain? No/denies                   ADULT SLP TREATMENT - 04/03/21 1441       General Information   Behavior/Cognition Alert;Pleasant mood;Requires cueing;Lethargic;Distractible      Treatment Provided   Treatment provided Dysphagia      Dysphagia Treatment   Treatment Methods Therapeutic exercise;Compensation strategy training;Patient/caregiver education    Patient observed directly with PO's Yes    Type of PO's observed Thin liquids    Feeding Able to feed self    Liquids provided via Cup    Oral Phase Signs & Symptoms Oral holding    Pharyngeal Phase Signs & Symptoms Suspected delayed swallow initiation;Wet vocal quality;Immediate cough;Delayed cough    Other treatment/comments Pt reported swallow function is "better" and endorsed less frequent coughing at meals. Inconsistent completion of swallow exercise HEP reported secondary to "boredom." SLP trialed modification of swallow exercises to optimize engagement but pt elected to continue effortful  swallows and [place]ak[place] at this time. In guided practice, pt able to completed 23 Effortful swallows and 5 Masako swallows. With PO trials of thin liquids via cup, pt exhibited coughing x2 (x1 when pt attempted Masako with water). SLP provided education re: avoiding water with Masako as well as recommendation of when to complete swallow exercises to aid carryover. Pt verbalized understanding and agreement. handout provided.      Assessment / Recommendations / Plan   Plan Continue with current plan of care      Progression Toward Goals   Progression toward goals Not progressing toward goals (comment)   questionable carryover             SLP Education - 04/03/21 1535     Education Details when to complete swallow exercises, recommendations    Person(s) Educated Patient    Methods Demonstration;Explanation;Verbal cues;Handout    Comprehension Verbalized understanding;Returned demonstration;Verbal cues required;Need further instruction              SLP Short Term Goals - 04/03/21 1443       SLP SHORT TERM GOAL #1   Title Pt will verbalize and demonstrate recommended swallow exercises with occasional min A over 2 sessions    Baseline 03-27-21 (Effortful swallow and Masako)    Time 1    Period Weeks    Status On-going      SLP SHORT  TERM GOAL #2   Title Pt will complete dysphagia HEP as recommended for 4/5 days with occasional min A from caregiver    Time 1    Period Weeks    Status On-going      SLP SHORT TERM GOAL #3   Title Caregiver will assist with carryover of swallow exercise HEP and compensatory swallow strategies to reduce risk of aspiration given rare min A over 2 sessions    Time 1    Period Weeks    Status On-going      SLP SHORT TERM GOAL #4   Title Pt will identify errors and correct problems in functional and structured scenarios given occasional min A over 2 sessions    Time 1    Period Weeks    Status On-going      SLP SHORT TERM GOAL #5   Title Pt will  implement compensations to aid processing speed on structured and unstructured tasks with 80% accuracy over 2 sessions    Time 1    Period Weeks    Status On-going              SLP Long Term Goals - 04/03/21 1444       SLP LONG TERM GOAL #1   Title Pt and caregiver will implement recommended compensatory strategies to reduce risk of aspiration given rare min A over 2 sessions    Time 5    Period Weeks    Status On-going      SLP LONG TERM GOAL #2   Title Pt will participate in objective swallow study (as agreeable) to re-evaluate oropharyngeal dysphagia    Time 5    Period Weeks    Status On-going      SLP LONG TERM GOAL #3   Title Pt will demonstrate awareness of deficits and modify functional scenarios to ensure accurate and safe completion given rare min A over 2 sessions    Time 9    Period Weeks    Status On-going      SLP LONG TERM GOAL #4   Title Pt will demonstrate improved sustained attention and auditory processing with increased participation in iADL and ADLS given rare min A over 2 sessions    Time 9    Period Weeks    Status On-going              Plan - 04/03/21 1442     Clinical Impression Statement "Douglas Spencer" was referred for OPST secondary to cerebral infarction on 01-30-21. Conducted ongoing education and training of swallow exercise HEP to target oral and pharyngeal deficits evidenced on most recent MBSS on 02-20-21. Inconsistent carryover reported at home due to boredom. SLP focused on implementation of effortful swallows and Masako swallows due to simplify current routine to aid carryover. SLP educated and instructed recommended swallow strategies and thorough oral care to decrease risk of aspiration as pt has elected to consume mechanical soft/regular textures and thin liquids despite recommendation/education. Skilled ST is warranted to address moderate oropharyngeal and mild cognitive impairment to optimize return to baseline and increase safety.     Speech Therapy Frequency 2x / week    Duration 12 weeks    Treatment/Interventions Compensatory strategies;Aspiration precaution training;Pharyngeal strengthening exercises;Cueing hierarchy;Functional tasks;Patient/family education;SLP instruction and feedback;Internal/external aids;Language facilitation;Compensatory techniques;Multimodal communcation approach    Potential to Achieve Goals Fair    Potential Considerations Cooperation/participation level;Previous level of function;Ability to learn/carryover information;Family/community support    Consulted and Agree with Plan of Care Patient  Patient will benefit from skilled therapeutic intervention in order to improve the following deficits and impairments:   Dysphagia, oropharyngeal phase    Problem List Patient Active Problem List   Diagnosis Date Noted   Protein-calorie malnutrition, severe 02/06/2021   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

## 2021-04-05 ENCOUNTER — Ambulatory Visit: Payer: Medicare HMO | Admitting: Occupational Therapy

## 2021-04-05 ENCOUNTER — Ambulatory Visit: Payer: Medicare HMO

## 2021-04-10 ENCOUNTER — Ambulatory Visit: Payer: Medicare HMO | Admitting: Occupational Therapy

## 2021-04-10 ENCOUNTER — Ambulatory Visit: Payer: Medicare HMO

## 2021-04-10 ENCOUNTER — Ambulatory Visit: Payer: Medicare HMO | Admitting: Physical Therapy

## 2021-04-12 ENCOUNTER — Ambulatory Visit: Payer: Medicare HMO | Admitting: Occupational Therapy

## 2021-04-12 ENCOUNTER — Ambulatory Visit: Payer: Medicare HMO | Admitting: Physical Therapy

## 2021-04-12 ENCOUNTER — Ambulatory Visit: Payer: Medicare HMO

## 2021-04-13 ENCOUNTER — Other Ambulatory Visit: Payer: Self-pay | Admitting: Registered Nurse

## 2021-04-16 ENCOUNTER — Ambulatory Visit: Payer: Medicare HMO | Admitting: Internal Medicine

## 2021-04-16 NOTE — Progress Notes (Deleted)
? ? ?PATIENT: Douglas Spencer ?DOB: 12/16/1945 ? ?REASON FOR VISIT: follow up ?HISTORY FROM: patient ?PRIMARY NEUROLOGIST:  ? ?HISTORY OF PRESENT ILLNESS: ?Today 04/16/21: ? ?Douglas Spencer is a 76 year old male who presented to the ED on January 30, 2021 after being found on the floor by a friend unable to move his left side.  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  Polysubstance abuse. Received TNK.  ? 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  2D echo with ejection fraction of 60 to 65% with no wall motion abnormality.  LDL 104 started on atorvastatin 40 mg daily.  Patient was discharged on aspirin and Plavix.  UDS positive for cocaine and THC ? ?Patient was placed on Vimpat 50 mg twice a day.  Unclear who is managing his seizure medication prior to his admission.  Appears that at 1 point he was on Keppra but discontinued this due to making him feel weird. ? ?Patient was discharged to inpatient rehab ? ?Today:  ? ?HISTORY  ? ?REVIEW OF SYSTEMS: Out of a complete 14 system review of symptoms, the patient complains only of the following symptoms, and all other reviewed systems are negative. ? ?ALLERGIES: ?No Known Allergies ? ?HOME MEDICATIONS: ?Outpatient Medications Prior to Visit  ?Medication Sig Dispense Refill  ? acetaminophen (TYLENOL) 325 MG tablet Take 2 tablets (650 mg total) by mouth every 4 (four) hours as needed for mild pain (or temp > 37.5 C (99.5 F)). (Patient not taking: Reported on 02/28/2021)    ? amantadine (SYMMETREL) 50 MG/5ML solution Take 10 mLs (100 mg total) by mouth daily. 473 mL 0  ? amLODipine (NORVASC) 10 MG tablet Take 1 tablet (10 mg total) by mouth daily. 30 tablet 0  ? aspirin 81 MG chewable tablet Chew 1 tablet (81 mg total) by mouth daily.    ? atorvastatin (LIPITOR) 40 MG tablet TAKE 1 TABLET BY MOUTH EVERY DAY 30 tablet 0  ? clopidogrel (PLAVIX) 75 MG tablet Continue Plavix x6 days and stop 6 tablet  0  ? hydrOXYzine (ATARAX) 25 MG tablet Take 1 tablet (25 mg total) by mouth 2 (two) times daily as needed for anxiety. 60 tablet 2  ? lacosamide (VIMPAT) 50 MG TABS tablet Take 50 mg by mouth 2 (two) times daily.    ? levETIRAcetam (KEPPRA) 500 MG tablet Take 1 tablet (500 mg total) by mouth 2 (two) times daily. (Patient not taking: Reported on 04/03/2021) 60 tablet 2  ? polyethylene glycol (MIRALAX / GLYCOLAX) 17 g packet Take 17 g by mouth daily. (Patient not taking: Reported on 02/28/2021) 14 each 0  ? ?No facility-administered medications prior to visit.  ? ? ?PAST MEDICAL HISTORY: ?Past Medical History:  ?Diagnosis Date  ? ETOH abuse   ? Substance abuse ([place])   ? ? ?PAST SURGICAL HISTORY: ?No past surgical history on file. ? ?FAMILY HISTORY: ?No family history on file. ? ?SOCIAL HISTORY: ?Social History  ? ?Socioeconomic History  ? Marital status: Single  ?  Spouse name: Not on file  ? Number of children: Not on file  ? Years of education: Not on file  ? Highest education level: Not on file  ?Occupational History  ? Not on file  ?Tobacco Use  ? Smoking status: Former  ?  Packs/day: 0.50  ?  Types: Cigarettes  ? Smokeless tobacco: Not on file  ?Vaping Use  ? Vaping Use: Former  ?Substance and Sexual Activity  ?  Alcohol use: Not Currently  ?  Comment: pt states he drinks multiple beers every day.  ? Drug use: Not Currently  ?  Types: Marijuana, Cocaine  ? Sexual activity: Not Currently  ?Other Topics Concern  ? Not on file  ?Social History Narrative  ? Not on file  ? ?Social Determinants of Health  ? ?Financial Resource Strain: Not on file  ?Food Insecurity: Not on file  ?Transportation Needs: Not on file  ?Physical Activity: Not on file  ?Stress: Not on file  ?Social Connections: Not on file  ?Intimate Partner Violence: Not on file  ? ? ? ? ?PHYSICAL EXAM ? ?There were no vitals filed for this visit. ?There is no height or weight on file to calculate BMI. ? ?Generalized: Well developed, in no acute distress   ? ?Neurological examination  ?Mentation: Alert oriented to time, place, history taking. Follows all commands speech and language fluent ?Cranial nerve II-XII: Pupils were equal round reactive to light. Extraocular movements were full, visual field were full on confrontational test. Facial sensation and strength were normal. Uvula tongue midline. Head turning and shoulder shrug  were normal and symmetric. ?Motor: The motor testing reveals 5 over 5 strength of all 4 extremities. Good symmetric motor tone is noted throughout.  ?Sensory: Sensory testing is intact to soft touch on all 4 extremities. No evidence of extinction is noted.  ?Coordination: Cerebellar testing reveals good finger-nose-finger and heel-to-shin bilaterally.  ?Gait and station: Gait is normal. Tandem gait is normal. Romberg is negative. No drift is seen.  ?Reflexes: Deep tendon reflexes are symmetric and normal bilaterally.  ? ?DIAGNOSTIC DATA (LABS, IMAGING, TESTING) ?- I reviewed patient records, labs, notes, testing and imaging myself where available. ? ?Lab Results  ?Component Value Date  ? WBC 8.2 02/19/2021  ? HGB 14.6 02/19/2021  ? HCT 42.1 02/19/2021  ? MCV 86.8 02/19/2021  ? PLT 259 02/19/2021  ? ?   ?Component Value Date/Time  ? NA 140 02/20/2021 0512  ? K 4.0 02/20/2021 0512  ? CL 104 02/20/2021 0512  ? CO2 27 02/20/2021 0512  ? GLUCOSE 85 02/20/2021 0512  ? BUN 21 02/20/2021 0512  ? CREATININE 1.12 02/20/2021 0512  ? CALCIUM 8.8 (L) 02/20/2021 0512  ? PROT 6.3 (L) 02/07/2021 0510  ? ALBUMIN 3.4 (L) 02/07/2021 0510  ? AST 29 02/07/2021 0510  ? ALT 45 (H) 02/07/2021 0510  ? ALKPHOS 49 02/07/2021 0510  ? BILITOT 0.8 02/07/2021 0510  ? GFRNONAA >60 02/20/2021 0512  ? ?Lab Results  ?Component Value Date  ? CHOL 152 01/31/2021  ? HDL 36 (L) 01/31/2021  ? LDLCALC 104 (H) 01/31/2021  ? TRIG 61 01/31/2021  ? CHOLHDL 4.2 01/31/2021  ? ?Lab Results  ?Component Value Date  ? HGBA1C 5.3 01/31/2021  ? ?No results found for: VITAMINB12 ?No results  found for: TSH ? ? ? ?ASSESSMENT AND PLAN ?76 y.o. year old male  has a past medical history of ETOH abuse and Substance abuse ([place]). here with *** ? ? ? ? ?Ward Givens, MSN, NP-C 04/16/2021, 2:50 PM ?[facility] ?[address], [address] ?[place], [place] [address] ?[phone] ? ? ?

## 2021-04-17 ENCOUNTER — Inpatient Hospital Stay: Payer: Medicare HMO | Admitting: Adult Health

## 2021-04-20 ENCOUNTER — Telehealth: Payer: Self-pay | Admitting: *Deleted

## 2021-04-20 NOTE — Telephone Encounter (Signed)
Notified Dr Naaman Plummer will fill medications until PCP appt. ?

## 2021-04-20 NOTE — Telephone Encounter (Signed)
Mr Susa Griffins called and reports that Mr Kolar missed his appt with his new PCP due to Mr Susa Griffins being sick and could not take him.  The appt was changed to 07/12/21 with Dr Scarlette Calico. He is asking what to do about his medications refills until then. He has appt with Dr Naaman Plummer 06/20/21. ?

## 2021-04-22 ENCOUNTER — Other Ambulatory Visit: Payer: Self-pay | Admitting: Registered Nurse

## 2021-04-24 ENCOUNTER — Other Ambulatory Visit: Payer: Self-pay | Admitting: Registered Nurse

## 2021-04-27 ENCOUNTER — Other Ambulatory Visit: Payer: Self-pay | Admitting: *Deleted

## 2021-04-27 ENCOUNTER — Encounter: Payer: Self-pay | Admitting: *Deleted

## 2021-04-27 MED ORDER — AMLODIPINE BESYLATE 10 MG PO TABS: 10.0000 mg | ORAL_TABLET | Freq: Every day | ORAL | 2 refills | Status: DC

## 2021-04-27 NOTE — Telephone Encounter (Signed)
Opened in error

## 2021-05-10 ENCOUNTER — Other Ambulatory Visit: Payer: Self-pay | Admitting: Registered Nurse

## 2021-05-17 ENCOUNTER — Other Ambulatory Visit: Payer: Self-pay

## 2021-05-17 NOTE — Patient Outreach (Signed)
[facility]) Care Management ? ?05/17/2021 ? ?Douglas Spencer ?1946-02-08 ?[id number] ? ? ?First telephone outreach attempt to obtain mRS. No answer. Left message for returned call. ? ?[name] ?[facility]-Care Management Assistant ?[phone] ? ?

## 2021-05-21 ENCOUNTER — Other Ambulatory Visit: Payer: Self-pay

## 2021-05-21 NOTE — Patient Outreach (Signed)
[facility]) Care Management ? ?05/21/2021 ? ?Kolyn Rozario ?[birth year] ?[id number] ? ? ?Patient refused mRs. mRs could not be obtained. mRs=7 ?  ? ?[name] ?Care Management Assistant ?[phone] ? ?

## 2021-05-22 ENCOUNTER — Other Ambulatory Visit: Payer: Self-pay | Admitting: Physical Medicine & Rehabilitation

## 2021-05-22 NOTE — Telephone Encounter (Signed)
PMP was Reviewed.  ?Vimpat e-scribed today. Douglas Spencer has a scheduled appointment with Dr Naaman Plummer next month.  ?

## 2021-06-05 ENCOUNTER — Other Ambulatory Visit: Payer: Self-pay | Admitting: Registered Nurse

## 2021-06-19 ENCOUNTER — Other Ambulatory Visit: Payer: Self-pay | Admitting: Registered Nurse

## 2021-06-20 ENCOUNTER — Encounter: Payer: Medicare HMO | Attending: Physical Medicine & Rehabilitation | Admitting: Physical Medicine & Rehabilitation

## 2021-06-25 ENCOUNTER — Other Ambulatory Visit: Payer: Self-pay | Admitting: Registered Nurse

## 2021-07-12 ENCOUNTER — Encounter: Payer: Self-pay | Admitting: Internal Medicine

## 2021-07-12 ENCOUNTER — Ambulatory Visit (INDEPENDENT_AMBULATORY_CARE_PROVIDER_SITE_OTHER): Payer: Medicare HMO | Admitting: Internal Medicine

## 2021-07-12 VITALS — BP 126/84 | HR 75 | Temp 98.2°F | Ht 68.0 in | Wt 128.0 lb

## 2021-07-12 DIAGNOSIS — C44319 Basal cell carcinoma of skin of other parts of face: Secondary | ICD-10-CM

## 2021-07-12 DIAGNOSIS — Z23 Encounter for immunization: Secondary | ICD-10-CM | POA: Diagnosis not present

## 2021-07-12 DIAGNOSIS — R69 Illness, unspecified: Secondary | ICD-10-CM | POA: Diagnosis not present

## 2021-07-12 DIAGNOSIS — R7989 Other specified abnormal findings of blood chemistry: Secondary | ICD-10-CM | POA: Insufficient documentation

## 2021-07-12 DIAGNOSIS — K5904 Chronic idiopathic constipation: Secondary | ICD-10-CM | POA: Diagnosis not present

## 2021-07-12 DIAGNOSIS — B182 Chronic viral hepatitis C: Secondary | ICD-10-CM

## 2021-07-12 DIAGNOSIS — C4491 Basal cell carcinoma of skin, unspecified: Secondary | ICD-10-CM | POA: Insufficient documentation

## 2021-07-12 DIAGNOSIS — Z0001 Encounter for general adult medical examination with abnormal findings: Secondary | ICD-10-CM

## 2021-07-12 LAB — BASIC METABOLIC PANEL
BUN: 14 mg/dL (ref 6–23)
CO2: 29 mEq/L (ref 19–32)
Calcium: 9.7 mg/dL (ref 8.4–10.5)
Chloride: 103 mEq/L (ref 96–112)
Creatinine, Ser: 1.08 mg/dL (ref 0.40–1.50)
GFR: 66.23 mL/min (ref >60.00)
Glucose, Bld: 90 mg/dL (ref 70–99)
Potassium: 3.9 mEq/L (ref 3.5–5.1)
Sodium: 141 mEq/L (ref 135–145)

## 2021-07-12 LAB — TSH: TSH: 0.77 u[IU]/mL (ref 0.35–5.50)

## 2021-07-12 NOTE — Patient Instructions (Addendum)
[facility] [phone]   Constipation, Adult Constipation is when a person has fewer than three bowel movements in a week, has difficulty having a bowel movement, or has stools (feces) that are dry, hard, or larger than normal. Constipation may be caused by an underlying condition. It may become worse with age if a person takes certain medicines and does not take in enough fluids. Follow these instructions at home: Eating and drinking  Eat foods that have a lot of fiber, such as beans, whole grains, and fresh fruits and vegetables. Limit foods that are low in fiber and high in fat and processed sugars, such as fried or sweet foods. These include french fries, hamburgers, cookies, candies, and soda. Drink enough fluid to keep your urine pale yellow. General instructions Exercise regularly or as told by your health care provider. Try to do 150 minutes of moderate exercise each week. Use the bathroom when you have the urge to go. Do not hold it in. Take over-the-counter and prescription medicines only as told by your health care provider. This includes any fiber supplements. During bowel movements: Practice deep breathing while relaxing the lower abdomen. Practice pelvic floor relaxation. Watch your condition for any changes. Let your health care provider know about them. Keep all follow-up visits as told by your health care provider. This is important. Contact a health care provider if: You have pain that gets worse. You have a fever. You do not have a bowel movement after 4 days. You vomit. You are not hungry or you lose weight. You are bleeding from the opening between the buttocks (anus). You have thin, pencil-like stools. Get help right away if: You have a fever and your symptoms suddenly get worse. You leak stool or have blood in your stool. Your abdomen is bloated. You have severe pain in your abdomen. You feel dizzy or you  faint. Summary Constipation is when a person has fewer than three bowel movements in a week, has difficulty having a bowel movement, or has stools (feces) that are dry, hard, or larger than normal. Eat foods that have a lot of fiber, such as beans, whole grains, and fresh fruits and vegetables. Drink enough fluid to keep your urine pale yellow. Take over-the-counter and prescription medicines only as told by your health care provider. This includes any fiber supplements. This information is not intended to replace advice given to you by your health care provider. Make sure you discuss any questions you have with your health care provider. Document Revised: 12/16/2018 Document Reviewed: 12/16/2018 Elsevier Patient Education  [place].

## 2021-07-12 NOTE — Progress Notes (Signed)
Subjective:  Patient ID: Douglas Spencer, male    DOB: [birth year]  Age: 76 y.o. MRN: [id number]  CC: Annual Exam and Hypertension   HPI Douglas Spencer presents for a CPX and to establish.  He suffered a right basal ganglia infarct about 6 months ago.  His left-sided symptoms have improved.  He is being treated for hypertension with amlodipine but complains of constipation.  The constipation is adequately treated with MiraLAX and stool softeners.  He complains of a several year history of lesion on the left side of his face.  History Douglas Spencer has a past medical history of ETOH abuse and Substance abuse ([place]).   He has no past surgical history on file.   His family history is not on file.He reports that he has quit smoking. His smoking use included cigarettes. He smoked an average of .5 packs per day. He does not have any smokeless tobacco history on file. He reports that he does not currently use alcohol. He reports that he does not currently use drugs after having used the following drugs: Marijuana and Cocaine.  Outpatient Medications Prior to Visit  Medication Sig Dispense Refill   acetaminophen (TYLENOL) 325 MG tablet Take 2 tablets (650 mg total) by mouth every 4 (four) hours as needed for mild pain (or temp > 37.5 C (99.5 F)).     aspirin 81 MG chewable tablet Chew 1 tablet (81 mg total) by mouth daily.     atorvastatin (LIPITOR) 40 MG tablet TAKE 1 TABLET BY MOUTH EVERY DAY 90 tablet 0   hydrOXYzine (ATARAX) 25 MG tablet Take 1 tablet (25 mg total) by mouth 2 (two) times daily as needed for anxiety. 60 tablet 2   lacosamide (VIMPAT) 50 MG TABS tablet TAKE 1 TABLET BY MOUTH TWICE A DAY 60 tablet 0   amLODipine (NORVASC) 10 MG tablet Take 1 tablet (10 mg total) by mouth daily. 30 tablet 2   clopidogrel (PLAVIX) 75 MG tablet Continue Plavix x6 days and stop 6 tablet 0   FLUZONE HIGH-DOSE QUADRIVALENT 0.7 ML SUSY      levETIRAcetam (KEPPRA) 500 MG tablet Take 1 tablet (500 mg total)  by mouth 2 (two) times daily. 60 tablet 2   polyethylene glycol (MIRALAX / GLYCOLAX) 17 g packet Take 17 g by mouth daily. 14 each 0   amantadine (SYMMETREL) 50 MG/5ML solution TAKE 10 MLS (100 MG TOTAL) BY MOUTH DAILY. (Patient not taking: Reported on 07/12/2021) 473 mL 0   No facility-administered medications prior to visit.    ROS Review of Systems  Constitutional:  Negative for chills, diaphoresis, fatigue and fever.  HENT: Negative.    Eyes: Negative.   Respiratory:  Negative for cough, chest tightness, shortness of breath and wheezing.   Cardiovascular:  Negative for chest pain, palpitations and leg swelling.  Gastrointestinal:  Positive for constipation. Negative for abdominal pain, diarrhea, nausea and vomiting.  Endocrine: Negative.   Genitourinary: Negative.  Negative for difficulty urinating.  Musculoskeletal: Negative.   Skin: Negative.  Negative for color change and pallor.  Neurological:  Positive for weakness. Negative for dizziness, seizures, speech difficulty, light-headedness and headaches.  Hematological:  Negative for adenopathy. Does not bruise/bleed easily.  Psychiatric/Behavioral: Negative.     Objective:  BP 126/84 (BP Location: Left Arm, Patient Position: Sitting, Cuff Size: Normal)   Pulse 75   Temp 98.2 F (36.8 C) (Oral)   Ht '5\' 8"'$  (1.727 m)   Wt 128 lb (58.1 kg)   SpO2 95%  BMI 19.46 kg/m   Physical Exam Vitals reviewed.  Constitutional:      General: He is not in acute distress.    Appearance: He is ill-appearing. He is not toxic-appearing or diaphoretic.  HENT:     Nose: Nose normal.     Mouth/Throat:     Mouth: Mucous membranes are moist.  Eyes:     General: No scleral icterus.    Conjunctiva/sclera: Conjunctivae normal.  Neck:     Vascular: No carotid bruit.  Cardiovascular:     Rate and Rhythm: Normal rate and regular rhythm.     Heart sounds: No murmur heard. Pulmonary:     Effort: Pulmonary effort is normal.     Breath sounds: No  stridor. No wheezing, rhonchi or rales.  Abdominal:     General: Abdomen is flat.     Palpations: There is no mass.     Tenderness: There is no abdominal tenderness. There is no guarding.     Hernia: No hernia is present.  Musculoskeletal:        General: Normal range of motion.     Cervical back: Neck supple.     Right lower leg: No edema.     Left lower leg: No edema.  Lymphadenopathy:     Cervical: No cervical adenopathy.  Skin:    General: Skin is warm and dry.     Findings: Lesion present.     Comments: Left malar surface there is a 7 mm lesion.  There is central ulceration surrounded by a raised pearly border.  Neurological:     Mental Status: He is alert. Mental status is at baseline.     Motor: Weakness present.  Psychiatric:        Mood and Affect: Mood normal.        Behavior: Behavior normal.    Lab Results  Component Value Date   WBC 8.2 02/19/2021   HGB 14.6 02/19/2021   HCT 42.1 02/19/2021   PLT 259 02/19/2021   GLUCOSE 90 07/12/2021   CHOL 152 01/31/2021   TRIG 61 01/31/2021   HDL 36 (L) 01/31/2021   LDLCALC 104 (H) 01/31/2021   ALT 45 (H) 02/07/2021   AST 29 02/07/2021   NA 141 07/12/2021   K 3.9 07/12/2021   CL 103 07/12/2021   CREATININE 1.08 07/12/2021   BUN 14 07/12/2021   CO2 29 07/12/2021   TSH 0.77 07/12/2021   INR 1.1 01/31/2021   HGBA1C 5.3 01/31/2021     Assessment & Plan:   Douglas Spencer was seen today for annual exam and hypertension.  Diagnoses and all orders for this visit:  Basal cell carcinoma (BCC) of skin of other part of face -     Cancel: Ambulatory referral to Dermatology -     Ambulatory referral to Dermatology  Elevated LFTs- He is immune to hep A and B but does not have active hep B infection.  He has chronic hep C infection. -     Hepatitis B surface antigen; Future -     Hepatitis A antibody, total; Future -     Hepatitis B core antibody, total; Future -     Hepatitis C antibody; Future -     Hepatitis B surface  antibody,qualitative; Future -     Hepatitis B surface antibody,qualitative -     Hepatitis C antibody -     Hepatitis B core antibody, total -     Hepatitis A antibody, total -  Hepatitis B surface antigen  Chronic idiopathic constipation- Labs are negative for secondary causes. -     Basic metabolic panel; Future -     TSH; Future -     TSH -     Basic metabolic panel  Need for vaccination -     Pneumococcal conjugate vaccine 20-valent  Need for vaccination with combined diphtheria-tetanus-pertussis (DTaP) -     Tdap (BOOSTRIX) 5-2.5-18.5 LF-MCG/0.5 injection; Inject 0.5 mLs into the muscle once for 1 dose.  Need for varicella vaccine -     Zoster Vaccine Adjuvanted [facility]) injection; Inject 0.5 mLs into the muscle once for 1 dose.  Encounter for general adult medical examination with abnormal findings- Exam completed, labs reviewed, vaccines reviewed and updated, no cancer screenings indicated, patient education was given.  Chronic hepatitis C without hepatic coma (HCC) -     AMB referral to hepatitis C clinic  Other orders -     HCV RNA, Quantitative Real Time PCR   I have discontinued Elenore Rota Klippel's polyethylene glycol, clopidogrel, levETIRAcetam, amLODipine, and Fluzone High-Dose Quadrivalent. I am also having him start on Shingrix and Boostrix. Additionally, I am having him maintain his acetaminophen, aspirin, hydrOXYzine, atorvastatin, amantadine, and lacosamide.  Meds ordered this encounter  Medications   Zoster Vaccine Adjuvanted [facility]) injection    Sig: Inject 0.5 mLs into the muscle once for 1 dose.    Dispense:  0.5 mL    Refill:  1   Tdap (BOOSTRIX) 5-2.5-18.5 LF-MCG/0.5 injection    Sig: Inject 0.5 mLs into the muscle once for 1 dose.    Dispense:  0.5 mL    Refill:  0     Follow-up: Return in about 3 months (around 10/12/2021).  Scarlette Calico, MD

## 2021-07-13 ENCOUNTER — Encounter: Payer: Self-pay | Admitting: Internal Medicine

## 2021-07-13 DIAGNOSIS — Z23 Encounter for immunization: Secondary | ICD-10-CM | POA: Insufficient documentation

## 2021-07-13 DIAGNOSIS — Z0001 Encounter for general adult medical examination with abnormal findings: Secondary | ICD-10-CM | POA: Insufficient documentation

## 2021-07-13 MED ORDER — SHINGRIX 50 MCG/0.5ML IM SUSR: 0.5000 mL | Freq: Once | INTRAMUSCULAR | 1 refills | Status: AC

## 2021-07-13 MED ORDER — BOOSTRIX 5-2.5-18.5 LF-MCG/0.5 IM SUSP: 0.5000 mL | Freq: Once | INTRAMUSCULAR | 0 refills | Status: AC

## 2021-07-16 DIAGNOSIS — B182 Chronic viral hepatitis C: Secondary | ICD-10-CM | POA: Insufficient documentation

## 2021-07-16 LAB — HEPATITIS A ANTIBODY, TOTAL: Hepatitis A AB,Total: REACTIVE — AB

## 2021-07-16 LAB — HEPATITIS B CORE ANTIBODY, TOTAL: Hep B Core Total Ab: REACTIVE — AB

## 2021-07-16 LAB — HEPATITIS C ANTIBODY
Hepatitis C Ab: REACTIVE — AB
SIGNAL TO CUT-OFF: >11 — ABNORMAL HIGH (ref <1.00)

## 2021-07-16 LAB — HCV RNA,QUANTITATIVE REAL TIME PCR
HCV Quantitative Log: 5.93 Log IU/mL — ABNORMAL HIGH
HCV RNA, PCR, QN: 859000 IU/mL — ABNORMAL HIGH

## 2021-07-16 LAB — HEPATITIS B SURFACE ANTIGEN: Hepatitis B Surface Ag: NONREACTIVE

## 2021-07-16 LAB — HEPATITIS B SURFACE ANTIBODY,QUALITATIVE: Hep B S Ab: REACTIVE — AB

## 2021-07-17 ENCOUNTER — Telehealth: Payer: Self-pay

## 2021-07-17 ENCOUNTER — Other Ambulatory Visit: Payer: Self-pay | Admitting: Internal Medicine

## 2021-07-17 DIAGNOSIS — F411 Generalized anxiety disorder: Secondary | ICD-10-CM

## 2021-07-17 DIAGNOSIS — G40909 Epilepsy, unspecified, not intractable, without status epilepticus: Secondary | ICD-10-CM

## 2021-07-17 DIAGNOSIS — E785 Hyperlipidemia, unspecified: Secondary | ICD-10-CM

## 2021-07-17 MED ORDER — ATORVASTATIN CALCIUM 40 MG PO TABS: 40.0000 mg | ORAL_TABLET | Freq: Every day | ORAL | 1 refills | Status: DC

## 2021-07-17 MED ORDER — HYDROXYZINE HCL 25 MG PO TABS: 25.0000 mg | ORAL_TABLET | Freq: Two times a day (BID) | ORAL | 0 refills | Status: DC | PRN

## 2021-07-17 MED ORDER — LACOSAMIDE 50 MG PO TABS: 50.0000 mg | ORAL_TABLET | Freq: Two times a day (BID) | ORAL | 0 refills | Status: DC

## 2021-07-17 NOTE — Telephone Encounter (Signed)
Pt is requesting a refill on: hydrOXYzine (ATARAX) 25 MG tablet lacosamide (VIMPAT) 50 MG TABS tablet atorvastatin (LIPITOR) 40 MG tablet  Pharmacy: [id number] [place] ROV 10/11/21

## 2021-07-18 ENCOUNTER — Other Ambulatory Visit: Payer: Self-pay | Admitting: Physical Medicine & Rehabilitation

## 2021-07-18 DIAGNOSIS — F411 Generalized anxiety disorder: Secondary | ICD-10-CM

## 2021-08-01 ENCOUNTER — Other Ambulatory Visit (HOSPITAL_COMMUNITY): Payer: Self-pay

## 2021-08-01 ENCOUNTER — Telehealth: Payer: Self-pay

## 2021-08-01 NOTE — Telephone Encounter (Signed)
[facility] Patient Teacher, English as a foreign language completed.    The patient is insured through [employer] D.  Medication will need a PA .  We will continue to follow to see if copay assistance is needed.  Ileene Patrick, [facility] Patient [facility] Phone: [phone] Fax:  [phone]

## 2021-08-02 ENCOUNTER — Ambulatory Visit (INDEPENDENT_AMBULATORY_CARE_PROVIDER_SITE_OTHER): Payer: Medicare HMO | Admitting: Family

## 2021-08-02 ENCOUNTER — Other Ambulatory Visit: Payer: Self-pay

## 2021-08-02 ENCOUNTER — Encounter: Payer: Self-pay | Admitting: Family

## 2021-08-02 VITALS — BP 128/85 | HR 52 | Temp 98.0°F | Wt 126.0 lb

## 2021-08-02 DIAGNOSIS — B182 Chronic viral hepatitis C: Secondary | ICD-10-CM

## 2021-08-02 DIAGNOSIS — R69 Illness, unspecified: Secondary | ICD-10-CM | POA: Diagnosis not present

## 2021-08-02 NOTE — Assessment & Plan Note (Signed)
Douglas Spencer is a 76 y/o male with chronic Hepatitis C with risk factor of injection drug use and age being born between [birth year] and [birth year]. Treatment naive and asymptomatic. Initial viral load of 859,000. Reviewed the pathogenesis, risks if left untreated, treatment options, financial assistance and plan of care for Hepatitis C. Check Hepatitis C and HIV lab work. 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 Plan for [place] or Epclusa pending lab work results. Will need to adjust atorvastatin prior to treatment secondary to medication interactions. Follow up 1 month after start of medication.

## 2021-08-02 NOTE — Patient Instructions (Addendum)
Nice to see you.  We will check your lab work today.  Plan for follow up in 1 months after start of medication.   Have a great day and stay safe!  Limit acetaminophen (Tylenol) usage to no more than 2 grams (2,000 mg) per day.  Avoid alcohol.  Do not share toothbrushes or razors.  Practice safe sex to protect against transmission as well as sexually transmitted disease.    Hepatitis C Hepatitis C is a viral infection of the liver. It can lead to scarring of the liver (cirrhosis), liver failure, or liver cancer. Hepatitis C may go undetected for months or years because people with the infection may not have symptoms, or they may have only mild symptoms. What are the causes? This condition is caused by the hepatitis C virus (HCV). The virus can spread from person to person (is contagious) through: Blood. Childbirth. A woman who has hepatitis C can pass it to her baby during birth. Bodily fluids, such as breast milk, tears, semen, vaginal fluids, and saliva. Blood transfusions or organ transplants done in the [place] before 1992.  What increases the risk? The following factors may make you more likely to develop this condition: Having contact with unclean (contaminated) needles or syringes. This may result from: Acupuncture. Tattoing. Body piercing. Injecting drugs. Having unprotected sex with someone who is infected. Needing treatment to filter your blood (kidney dialysis). Having HIV (human immunodeficiency virus) or AIDS (acquired immunodeficiency syndrome). Working in a job that involves contact with blood or bodily fluids, such as health care.  What are the signs or symptoms? Symptoms of this condition include: Fatigue. Loss of appetite. Nausea. Vomiting. Abdominal pain. Dark yellow urine. Yellowish skin and eyes (jaundice). Itchy skin. Clay-colored bowel movements. Joint pain. Bleeding and bruising easily. Fluid building up in your stomach (ascites).  In  some cases, you may not have any symptoms. How is this diagnosed? This condition is diagnosed with: Blood tests. Other tests to check how well your liver is functioning. They may include: Magnetic resonance elastography (MRE). This imaging test uses MRIs and sound waves to measure liver stiffness. Transient elastography. This imaging test uses ultrasounds to measure liver stiffness. Liver biopsy. This test requires taking a small tissue sample from your liver to examine it under a microscope.  How is this treated? Your health care provider may perform noninvasive tests or a liver biopsy to help decide the best course of treatment. Treatment may include: Antiviral medicines and other medicines. Follow-up treatments every 6-12 months for infections or other liver conditions. Receiving a donated liver (liver transplant).  Follow these instructions at home: Medicines Take over-the-counter and prescription medicines only as told by your health care provider. Take your antiviral medicine as told by your health care provider. Do not stop taking the antiviral even if you start to feel better. Do not take any medicines unless approved by your health care provider, including over-the-counter medicines and birth control pills. Activity Rest as needed. Do not have sex unless approved by your health care provider. Ask your health care provider when you may return to school or work. Eating and drinking Eat a balanced diet with plenty of fruits and vegetables, whole grains, and lowfat (lean) meats or non-meat proteins (such as beans or tofu). Drink enough fluids to keep your urine clear or pale yellow. Do not drink alcohol. General instructions Do not share toothbrushes, nail clippers, or razors. Wash your hands frequently with soap and water. If soap and water are  not available, use hand sanitizer. Cover any cuts or open sores on your skin to prevent spreading the virus. Keep all follow-up visits  as told by your health care provider. This is important. You may need follow-up visits every 6-12 months. How is this prevented? There is no vaccine for hepatitis C. The only way to prevent the disease is to reduce the risk of exposure to the virus. Make sure you: Wash your hands frequently with soap and water. If soap and water are not available, use hand sanitizer. Do not share needles or syringes. Practice safe sex and use condoms. Avoid handling blood or bodily fluids without gloves or other protection. Avoid getting tattoos or piercings in shops or other locations that are not clean.  Contact a health care provider if: You have a fever. You develop abdominal pain. You pass dark urine. You pass clay-colored stools. You develop joint pain. Get help right away if: You have increasing fatigue or weakness. You lose your appetite. You cannot eat or drink without vomiting. You develop jaundice or your jaundice gets worse. You bruise or bleed easily. Summary Hepatitis C is a viral infection of the liver. It can lead to scarring of the liver (cirrhosis), liver failure, or liver cancer. The hepatitis C virus (HCV) causes this condition. The virus can pass from person to person (is contagious). You should not take any medicines unless approved by your health care provider. This includes over-the-counter medicines and birth control pills. This information is not intended to replace advice given to you by your health care provider. Make sure you discuss any questions you have with your health care provider. Document Released: 01/26/2000 Document Revised: 03/05/2016 Document Reviewed: 03/05/2016 Elsevier Interactive Patient Education  [employer].

## 2021-08-02 NOTE — Progress Notes (Signed)
Subjective:    Patient ID: Douglas Spencer, male    DOB: Aug 22, 1945, 76 y.o.   MRN: [id number]  No chief complaint on file.   HPI:  Douglas Spencer is a 76 y.o. male with previous medical history of CVA, chronic constipation, ETOH abuse, and basal cell carcinoma presenting today for initial evaluation of Hepatitis C.   Mr. Dupuy was seen by Internal Medicine on 07/12/21 for initial office visit and routine medical care and found to have elevated LFTs with lab work showing positive Hepatitis C antibody test and Hepatitis C RNA level of 859,000. Hepatitis B surface antigen was negative with positive Hepatitis B surface antibody and Core total antibody. This was the first he has been diagnosed with Hepatitis C. Risk factors include history of injection drug use and age being born between [birth year] and 9. No personal or family history of liver disease. Denies any current symptoms including abdominal pain, nausea, vomiting, fatigue, scleral icterus or jaundice. Has not received treatment to date. No current alcohol consumption, recreational or illicit drug use or tobacco use.   No Known Allergies    Outpatient Medications Prior to Visit  Medication Sig Dispense Refill   aspirin 81 MG chewable tablet Chew 1 tablet (81 mg total) by mouth daily.     atorvastatin (LIPITOR) 40 MG tablet Take 1 tablet (40 mg total) by mouth daily. 90 tablet 1   hydrOXYzine (ATARAX) 25 MG tablet Take 1 tablet (25 mg total) by mouth 2 (two) times daily as needed for anxiety. 180 tablet 0   lacosamide (VIMPAT) 50 MG TABS tablet Take 1 tablet (50 mg total) by mouth 2 (two) times daily. 180 tablet 0   acetaminophen (TYLENOL) 325 MG tablet Take 2 tablets (650 mg total) by mouth every 4 (four) hours as needed for mild pain (or temp > 37.5 C (99.5 F)). (Patient not taking: Reported on 08/02/2021)     amantadine (SYMMETREL) 50 MG/5ML solution TAKE 10 MLS (100 MG TOTAL) BY MOUTH DAILY. (Patient not taking: Reported on 07/12/2021)  473 mL 0   No facility-administered medications prior to visit.     Past Medical History:  Diagnosis Date   ETOH abuse    Substance abuse ([place])      No past surgical history on file.     Review of Systems  Constitutional:  Negative for chills, diaphoresis, fatigue and fever.  Respiratory:  Negative for cough, chest tightness, shortness of breath and wheezing.   Cardiovascular:  Negative for chest pain.  Gastrointestinal:  Negative for abdominal distention, abdominal pain, constipation, diarrhea, nausea and vomiting.  Neurological:  Negative for weakness and headaches.  Hematological:  Does not bruise/bleed easily.      Objective:    BP 128/85   Pulse (!) 52   Temp 98 F (36.7 C) (Temporal)   Wt 126 lb (57.2 kg)   SpO2 98%   BMI 19.16 kg/m  Nursing note and vital signs reviewed.  Physical Exam Constitutional:      General: He is not in acute distress.    Appearance: He is well-developed.  Cardiovascular:     Rate and Rhythm: Normal rate and regular rhythm.     Heart sounds: Normal heart sounds. No murmur heard.    No friction rub. No gallop.  Pulmonary:     Effort: Pulmonary effort is normal. No respiratory distress.     Breath sounds: Normal breath sounds. No wheezing or rales.  Chest:     Chest wall: No tenderness.  Abdominal:     General: Bowel sounds are normal. There is no distension.     Palpations: Abdomen is soft. There is no mass.     Tenderness: There is no abdominal tenderness. There is no guarding or rebound.  Skin:    General: Skin is warm and dry.  Neurological:     Mental Status: He is alert and oriented to person, place, and time.  Psychiatric:        Behavior: Behavior normal.        Thought Content: Thought content normal.        Judgment: Judgment normal.         03/05/2021    8:30 AM  Depression screen PHQ 2/9  Decreased Interest 0  Down, Depressed, Hopeless 0  PHQ - 2 Score 0  Altered sleeping 0  Tired, decreased energy 0   Change in appetite 0  Feeling bad or failure about yourself  0  Trouble concentrating 1  Moving slowly or fidgety/restless 0  Suicidal thoughts 0  PHQ-9 Score 1       Assessment & Plan:    Patient Active Problem List   Diagnosis Date Noted   Chronic hepatitis C without hepatic coma ([place]) 07/16/2021   Need for vaccination with combined diphtheria-tetanus-pertussis (DTaP) 07/13/2021   Need for vaccination 07/13/2021   Need for varicella vaccine 07/13/2021   Encounter for general adult medical examination with abnormal findings 07/13/2021   Basal cell carcinoma 07/12/2021   Elevated LFTs 07/12/2021   Chronic idiopathic constipation 07/12/2021   Protein-calorie malnutrition, severe 02/06/2021   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 Nephew is a 76 y/o male with chronic Hepatitis C with risk factor of injection drug use and age being born between [birth year] and [birth year]. Treatment naive and asymptomatic. Initial viral load of 859,000. Reviewed the pathogenesis, risks if left untreated, treatment options, financial assistance and plan of care for Hepatitis C. Check Hepatitis C and HIV lab work. 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 Plan for [place] or Epclusa pending lab work results. Will need to adjust atorvastatin prior to treatment secondary to medication interactions. Follow up 1 month after start of medication.       Relevant Orders   Protime-INR   Liver Fibrosis, FibroTest-ActiTest   HIV Antibody (routine testing w rflx)   Hepatitis C genotype   Hepatic function panel   CBC     I am having Lupita Leash maintain his acetaminophen, aspirin, amantadine, atorvastatin, lacosamide, and hydrOXYzine.    Follow-up: 1 month after start of  treatment or sooner if needed.    Terri Piedra, MSN, FNP-C Nurse Practitioner [facility] for Infectious Disease [place] number: [phone]

## 2021-08-08 LAB — HEPATIC FUNCTION PANEL
AG Ratio: 1.5 (calc) (ref 1.0–2.5)
ALT: 30 U/L (ref 9–46)
AST: 19 U/L (ref 10–35)
Albumin: 4.4 g/dL (ref 3.6–5.1)
Alkaline phosphatase (APISO): 63 U/L (ref 35–144)
Bilirubin, Direct: 0.2 mg/dL (ref 0.0–0.2)
Globulin: 2.9 g/dL (calc) (ref 1.9–3.7)
Indirect Bilirubin: 0.3 mg/dL (calc) (ref 0.2–1.2)
Total Bilirubin: 0.5 mg/dL (ref 0.2–1.2)
Total Protein: 7.3 g/dL (ref 6.1–8.1)

## 2021-08-08 LAB — HIV ANTIBODY (ROUTINE TESTING W REFLEX): HIV 1&2 Ab, 4th Generation: NONREACTIVE

## 2021-08-08 LAB — LIVER FIBROSIS, FIBROTEST-ACTITEST
ALT: 32 U/L (ref 9–46)
Alpha-2-Macroglobulin: 509 mg/dL — ABNORMAL HIGH (ref 106–279)
Apolipoprotein A1: 116 mg/dL (ref 94–176)
Bilirubin: 0.5 mg/dL (ref 0.2–1.2)
Fibrosis Score: 0.89
GGT: 49 U/L (ref 3–70)
Haptoglobin: 92 mg/dL (ref 43–212)
Necroinflammat ACT Score: 0.32
Reference ID: 4430079

## 2021-08-08 LAB — CBC
HCT: 45.6 % (ref 38.5–50.0)
Hemoglobin: 15.7 g/dL (ref 13.2–17.1)
MCH: 28.9 pg (ref 27.0–33.0)
MCHC: 34.4 g/dL (ref 32.0–36.0)
MCV: 83.8 fL (ref 80.0–100.0)
MPV: 10.3 fL (ref 7.5–12.5)
Platelets: 257 10*3/uL (ref 140–400)
RBC: 5.44 10*6/uL (ref 4.20–5.80)
RDW: 13.1 % (ref 11.0–15.0)
WBC: 6.8 10*3/uL (ref 3.8–10.8)

## 2021-08-08 LAB — HEPATITIS C GENOTYPE

## 2021-08-08 LAB — PROTIME-INR
INR: 1
Prothrombin Time: 10.8 s (ref 9.0–11.5)

## 2021-08-15 ENCOUNTER — Telehealth: Payer: Self-pay

## 2021-08-15 ENCOUNTER — Other Ambulatory Visit (HOSPITAL_COMMUNITY): Payer: Self-pay

## 2021-08-15 NOTE — Telephone Encounter (Signed)
[facility] Patient Advocate Encounter   Received notification from [place] [facility] that prior authorization for MAVYRET is required.   PA submitted on 08/15/2021 Key [id number] Status is pending    [facility] will continue to follow.   Ileene Patrick, [facility] Patient [facility] for Infectious Disease Phone: [phone] Fax:  [phone]

## 2021-08-28 ENCOUNTER — Other Ambulatory Visit (HOSPITAL_COMMUNITY): Payer: Self-pay

## 2021-08-30 ENCOUNTER — Other Ambulatory Visit: Payer: Self-pay | Admitting: Pharmacist

## 2021-08-30 ENCOUNTER — Telehealth: Payer: Self-pay

## 2021-08-30 ENCOUNTER — Other Ambulatory Visit (HOSPITAL_COMMUNITY): Payer: Self-pay

## 2021-08-30 DIAGNOSIS — B182 Chronic viral hepatitis C: Secondary | ICD-10-CM

## 2021-08-30 MED ORDER — MAVYRET 100-40 MG PO TABS
3.0000 | ORAL_TABLET | Freq: Every day | ORAL | 1 refills | Status: DC
  Filled 2021-08-30: qty 84, fill #0
  Filled 2021-08-31: qty 84, 28d supply, fill #0
  Filled 2021-09-24: qty 84, 28d supply, fill #1

## 2021-08-30 NOTE — Telephone Encounter (Signed)
Sent to [facility]. Thanks Butch Penny! Will counsel when patient is called/picks up medication.

## 2021-08-30 NOTE — Telephone Encounter (Signed)
[facility] Patient Advocate Encounter  Prior Authorization for Mavyret has been approved.     Effective dates: 02/11/21 through 10/24/21  Patients co-pay is $0.00.   Prescription can be filled at [facility].  [facility] will continue to follow.  Ileene Patrick, [facility] Patient [facility] for Infectious Disease Phone: [phone] Fax:  [phone]

## 2021-08-31 ENCOUNTER — Other Ambulatory Visit (HOSPITAL_COMMUNITY): Payer: Self-pay

## 2021-09-03 ENCOUNTER — Telehealth: Payer: Self-pay

## 2021-09-03 NOTE — Telephone Encounter (Signed)
[facility] Patient Advocate Encounter  Patient's medications have been couriered to [facility] from [facility] and will be picked up 09/06/21.  Douglas  [name] , [place] Patient [facility] for Infectious Disease Phone: [phone] Fax:  [phone]

## 2021-09-06 ENCOUNTER — Other Ambulatory Visit: Payer: Self-pay | Admitting: Family

## 2021-09-06 MED ORDER — ROSUVASTATIN CALCIUM 5 MG PO TABS: 5.0000 mg | ORAL_TABLET | Freq: Every day | ORAL | 2 refills | Status: DC

## 2021-09-06 NOTE — Progress Notes (Signed)
Counseled Mr. Rood on taking Mavyret 3 tablets by mouth daily for 8 weeks that he will take with food. If medication is missed and close to dose to take it otherwise skip dose. Medication interaction with atorvastatin and sent a prescription for rosuvastatin 5 mg while taking Mavyret. Plan for follow up in 1 month.

## 2021-09-20 ENCOUNTER — Other Ambulatory Visit (HOSPITAL_COMMUNITY): Payer: Self-pay

## 2021-09-24 ENCOUNTER — Other Ambulatory Visit (HOSPITAL_COMMUNITY): Payer: Self-pay

## 2021-09-25 ENCOUNTER — Telehealth: Payer: Self-pay

## 2021-09-25 ENCOUNTER — Telehealth: Payer: Self-pay | Admitting: Internal Medicine

## 2021-09-25 ENCOUNTER — Other Ambulatory Visit: Payer: Self-pay | Admitting: Internal Medicine

## 2021-09-25 DIAGNOSIS — F411 Generalized anxiety disorder: Secondary | ICD-10-CM

## 2021-09-25 MED ORDER — HYDROXYZINE HCL 25 MG PO TABS
25.0000 mg | ORAL_TABLET | Freq: Two times a day (BID) | ORAL | 0 refills | Status: DC | PRN
Start: 2021-09-25 — End: 2021-12-21

## 2021-09-25 NOTE — Telephone Encounter (Signed)
[facility] Patient Advocate Encounter  Patient's medications have been couriered to [facility] from [place]: [phone] , and will be administered on  10/04/2021.

## 2021-09-25 NOTE — Telephone Encounter (Signed)
Patient needs his atarax refilled - he has been taking 4 a day so he is out - Please send to [facility] on [address] - [place]

## 2021-10-04 ENCOUNTER — Other Ambulatory Visit: Payer: Self-pay | Admitting: Physical Medicine & Rehabilitation

## 2021-10-04 ENCOUNTER — Other Ambulatory Visit: Payer: Self-pay

## 2021-10-04 ENCOUNTER — Ambulatory Visit (INDEPENDENT_AMBULATORY_CARE_PROVIDER_SITE_OTHER): Payer: Medicare HMO | Admitting: Pharmacist

## 2021-10-04 DIAGNOSIS — R69 Illness, unspecified: Secondary | ICD-10-CM | POA: Diagnosis not present

## 2021-10-04 DIAGNOSIS — B182 Chronic viral hepatitis C: Secondary | ICD-10-CM

## 2021-10-04 NOTE — Progress Notes (Signed)
10/04/2021  HPI: Douglas Spencer is a 76 y.o. male who presents to the [facility] for Hepatitis C follow-up.  Medication: Mavyret  Start Date: 09/07/21  Hepatitis C Genotype: 1a  Fibrosis Score: F4 compensated  Hepatitis C RNA: 859,000 on 07/12/21  Patient Active Problem List   Diagnosis Date Noted   Chronic hepatitis C without hepatic coma ([place]) 07/16/2021   Need for vaccination with combined diphtheria-tetanus-pertussis (DTaP) 07/13/2021   Need for vaccination 07/13/2021   Need for varicella vaccine 07/13/2021   Encounter for general adult medical examination with abnormal findings 07/13/2021   Basal cell carcinoma 07/12/2021   Elevated LFTs 07/12/2021   Chronic idiopathic constipation 07/12/2021   Protein-calorie malnutrition, severe 02/06/2021   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   AMANTADINE (SYMMETREL) 50 MG/5ML SOLUTION    TAKE 10 MLS (100 MG TOTAL) BY MOUTH DAILY.   ASPIRIN 81 MG CHEWABLE TABLET    Chew 1 tablet (81 mg total) by mouth daily.   GLECAPREVIR-PIBRENTASVIR (MAVYRET) 100-40 MG TABS    Take 3 tablets by mouth daily with breakfast.   HYDROXYZINE (ATARAX) 25 MG TABLET    Take 1 tablet (25 mg total) by mouth 2 (two) times daily as needed for anxiety.   LACOSAMIDE (VIMPAT) 50 MG TABS TABLET    Take 1 tablet (50 mg total) by mouth 2 (two) times daily.   ROSUVASTATIN (CRESTOR) 5 MG TABLET    Take 1 tablet (5 mg total) by mouth daily.  Modified Medications   No medications on file  Discontinued Medications   No medications on file    Allergies: No Known Allergies  Past Medical History: Past Medical History:  Diagnosis Date    ETOH abuse    Substance abuse ([place])     Social History: Social History   Socioeconomic History   Marital status: Single    Spouse name: Not on file   Number of children: Not on file   Years of education: Not on file   Highest education level: Not on file  Occupational History   Not on file  Tobacco Use   Smoking status: Former    Packs/day: 0.50    Types: Cigarettes   Smokeless tobacco: Not on file  Vaping Use   Vaping Use: Former  Substance and Sexual Activity   Alcohol use: Not Currently    Comment: pt states he drinks multiple beers every day.   Drug use: Not Currently    Types: Marijuana, Cocaine   Sexual activity: Not Currently  Other Topics Concern   Not on file  Social History Narrative   Not on file   Social Determinants of Health   Financial Resource Strain: Not on file  Food Insecurity: Not on file  Transportation Needs: Not on file  Physical Activity: Not on file  Stress: Not on file  Social Connections: Not on file    Labs: Hepatitis C Lab Results  Component Value Date   HCVGENOTYPE 1a 08/02/2021   HEPCAB REACTIVE (A) 07/12/2021   HCVRNAPCRQN 859,000 (H) 07/12/2021   FIBROSTAGE F4 08/02/2021   Hepatitis B Lab Results  Component Value Date  HEPBSAB REACTIVE (A) 07/12/2021   HEPBSAG NON-REACTIVE 07/12/2021   HEPBCAB REACTIVE (A) 07/12/2021   Hepatitis A Lab Results  Component Value Date   HAV REACTIVE (A) 07/12/2021   HIV Lab Results  Component Value Date   HIV NON-REACTIVE 08/02/2021   Lab Results  Component Value Date   CREATININE 1.08 07/12/2021   CREATININE 1.12 02/20/2021   CREATININE 1.19 02/19/2021   CREATININE 0.89 02/07/2021   CREATININE 0.80 02/06/2021   Lab Results  Component Value Date   AST 19 08/02/2021   AST 29 02/07/2021   AST 19 01/30/2021   ALT 32 08/02/2021   ALT 30 08/02/2021   ALT 45 (H) 02/07/2021   INR 1.0 08/02/2021   INR 1.1 01/31/2021   INR 1.0 02/27/2020    Assessment: Douglas Spencer is here today  with his caregiver for follow up of his chronic Hepatitis C infection. He started Mavyret about a month ago and is doing well so far. He takes all three tablets together with food around 3:30pm each day. He has not missed a single dose. He is experiencing acid reflux with his administration but takes Tums shortly after and it helps. No other issues or concerns. Advised to keep up the good work and continue taking it for one more month. He has one more dose of his 1st month that he will take today. I gave him his 2nd and final refill today in clinic. He has documented immunity to Hepatitis A and B so no vaccines due today. He received Prevnar20 back in June. He transitioned to taking rosuvastatin 5 mg during treatment as well. No problems with this. Will check his Hepatitis C viral load today to assess early on treatment response. Will also check his LFTs to make sure they remain stable. Answered all questions.    Plan: - Continue Mavyret for 8 weeks - Hep C RNA and CMET today - F/u with Greg at [facility] on 10/2 at 230pm  Serenitie Vinton L. Shyler Holzman, PharmD, BCIDP, AAHIVP, CPP Clinical Pharmacist Practitioner [place] for Infectious Disease 10/04/2021, 1:57 PM

## 2021-10-06 LAB — COMPREHENSIVE METABOLIC PANEL
AG Ratio: 1.7 (calc) (ref 1.0–2.5)
ALT: 11 U/L (ref 9–46)
AST: 9 U/L — ABNORMAL LOW (ref 10–35)
Albumin: 4.2 g/dL (ref 3.6–5.1)
Alkaline phosphatase (APISO): 84 U/L (ref 35–144)
BUN: 14 mg/dL (ref 7–25)
CO2: 26 mmol/L (ref 20–32)
Calcium: 9.5 mg/dL (ref 8.6–10.3)
Chloride: 104 mmol/L (ref 98–110)
Creat: 1.22 mg/dL (ref 0.70–1.28)
Globulin: 2.5 g/dL (calc) (ref 1.9–3.7)
Glucose, Bld: 115 mg/dL — ABNORMAL HIGH (ref 65–99)
Potassium: 4.2 mmol/L (ref 3.5–5.3)
Sodium: 141 mmol/L (ref 135–146)
Total Bilirubin: 1 mg/dL (ref 0.2–1.2)
Total Protein: 6.7 g/dL (ref 6.1–8.1)

## 2021-10-06 LAB — HEPATITIS C RNA QUANTITATIVE
HCV Quantitative Log: <1.18 log IU/mL
HCV RNA, PCR, QN: <15 IU/mL

## 2021-10-11 ENCOUNTER — Ambulatory Visit: Payer: Medicare HMO | Admitting: Internal Medicine

## 2021-10-17 ENCOUNTER — Encounter: Payer: Self-pay | Admitting: Internal Medicine

## 2021-10-17 ENCOUNTER — Ambulatory Visit (INDEPENDENT_AMBULATORY_CARE_PROVIDER_SITE_OTHER): Payer: Medicare HMO | Admitting: Internal Medicine

## 2021-10-17 VITALS — BP 130/80 | HR 76 | Temp 97.5°F | Wt 129.0 lb

## 2021-10-17 DIAGNOSIS — I639 Cerebral infarction, unspecified: Secondary | ICD-10-CM

## 2021-10-17 NOTE — Progress Notes (Signed)
Subjective:  Patient ID: Douglas Spencer, male    DOB: 04-Jan-1946  Age: 76 y.o. MRN: [id number]  CC: Follow-up   HPI Douglas Spencer presents for f/up -  He reports no recent seizures and tells me his neurological symptoms are stable/gradually improving.  Outpatient Medications Prior to Visit  Medication Sig Dispense Refill   aspirin 81 MG chewable tablet Chew 1 tablet (81 mg total) by mouth daily.     Glecaprevir-Pibrentasvir (MAVYRET) 100-40 MG TABS Take 3 tablets by mouth daily with breakfast. 84 tablet 1   hydrOXYzine (ATARAX) 25 MG tablet Take 1 tablet (25 mg total) by mouth 2 (two) times daily as needed for anxiety. 180 tablet 0   lacosamide (VIMPAT) 50 MG TABS tablet Take 1 tablet (50 mg total) by mouth 2 (two) times daily. 180 tablet 0   rosuvastatin (CRESTOR) 5 MG tablet Take 1 tablet (5 mg total) by mouth daily. 30 tablet 2   acetaminophen (TYLENOL) 325 MG tablet Take 2 tablets (650 mg total) by mouth every 4 (four) hours as needed for mild pain (or temp > 37.5 C (99.5 F)). (Patient not taking: Reported on 08/02/2021)     amantadine (SYMMETREL) 50 MG/5ML solution TAKE 10 MLS (100 MG TOTAL) BY MOUTH DAILY. (Patient not taking: Reported on 07/12/2021) 473 mL 0   No facility-administered medications prior to visit.    ROS Review of Systems  Constitutional:  Negative for diaphoresis and fatigue.  HENT: Negative.    Eyes: Negative.  Negative for visual disturbance.  Respiratory:  Negative for cough, chest tightness, shortness of breath and wheezing.   Cardiovascular:  Negative for chest pain, palpitations and leg swelling.  Gastrointestinal:  Negative for abdominal pain, constipation, diarrhea and nausea.  Endocrine: Negative.   Genitourinary: Negative.  Negative for difficulty urinating.  Musculoskeletal: Negative.   Skin: Negative.   Allergic/Immunologic: Negative.   Neurological:  Positive for weakness. Negative for dizziness and light-headedness.  Psychiatric/Behavioral:  Negative.      Objective:  BP 130/80 (BP Location: Left Arm, Patient Position: Sitting, Cuff Size: Normal)   Pulse 76   Temp (!) 97.5 F (36.4 C) (Oral)   Wt 129 lb (58.5 kg)   BMI 19.61 kg/m   BP Readings from Last 3 Encounters:  10/17/21 130/80  08/02/21 128/85  07/12/21 126/84    Wt Readings from Last 3 Encounters:  10/17/21 129 lb (58.5 kg)  08/02/21 126 lb (57.2 kg)  07/12/21 128 lb (58.1 kg)    Physical Exam Vitals reviewed.  HENT:     Nose: Nose normal.     Mouth/Throat:     Mouth: Mucous membranes are moist.  Eyes:     General: No scleral icterus.    Conjunctiva/sclera: Conjunctivae normal.  Cardiovascular:     Rate and Rhythm: Normal rate and regular rhythm.     Heart sounds: No murmur heard. Pulmonary:     Effort: Pulmonary effort is normal.     Breath sounds: No stridor. No wheezing, rhonchi or rales.  Abdominal:     General: Abdomen is flat.     Palpations: There is no mass.     Tenderness: There is no abdominal tenderness. There is no guarding.     Hernia: No hernia is present.  Musculoskeletal:        General: Normal range of motion.     Cervical back: Neck supple.     Right lower leg: No edema.     Left lower leg: No edema.  Lymphadenopathy:  Cervical: No cervical adenopathy.  Skin:    General: Skin is warm and dry.  Neurological:     Mental Status: He is oriented to person, place, and time. Mental status is at baseline.     Lab Results  Component Value Date   WBC 6.8 08/02/2021   HGB 15.7 08/02/2021   HCT 45.6 08/02/2021   PLT 257 08/02/2021   GLUCOSE 115 (H) 10/04/2021   CHOL 152 01/31/2021   TRIG 61 01/31/2021   HDL 36 (L) 01/31/2021   LDLCALC 104 (H) 01/31/2021   ALT 11 10/04/2021   AST 9 (L) 10/04/2021   NA 141 10/04/2021   K 4.2 10/04/2021   CL 104 10/04/2021   CREATININE 1.22 10/04/2021   BUN 14 10/04/2021   CO2 26 10/04/2021   TSH 0.77 07/12/2021   INR 1.0 08/02/2021   HGBA1C 5.3 01/31/2021    DG Swallowing  Func-Speech Pathology  Result Date: 02/06/2021 Table formatting from the original result was not included. Objective Swallowing Evaluation: Type of Study: MBS-Modified Barium Swallow Study  Patient Details Name: Douglas Spencer MRN: [id number] Date of Birth: 12/08/1945 Today's Date: 02/06/2021 Time: SLP Start Time (ACUTE ONLY): 0820 -SLP Stop Time (ACUTE ONLY): 0847 SLP Time Calculation (min) (ACUTE ONLY): 27 min Past Medical History: No past medical history on file. Past Surgical History: No past surgical history on file. HPI: Pt is a 76 y.o. male who presented to the ED secondary to right-sided gaze, left-sided weakness and drift, slurred speech. TNK given. 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 EEG 12/21,12/22 were WNL. 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  Subjective: [birth year] [age] [birth year]  Recommendations for follow up therapy are one component of a multi-disciplinary discharge planning process, led by the attending physician.  Recommendations may be updated based on patient status, additional functional criteria and insurance authorization. Assessment / Plan / Recommendation Clinical Impressions 02/06/2021 Clinical Impression Pt demonstrates moderate oropharyngeal dysphagia with instance of trace sensed aspiration during the swallow with thin and nectar thick liquids with larger boluses, likely due to decreased laryngeal closure. Pt is able to achieve early and firm laryngeal closure with cues to "get ready to swallow" with nectar and thin liquids. Pt also with mild base of tongue weakness and reduced epiglottic deflection leading to mild residue with solids. Pt able to clear most penetrate/aspirate and residue with a throat clear and second swallow. Recommend a dys 2, vegetarian diet with nectar thick liquids. Pt is motivated to  eat and following instructions well . Suggest removal of NG tube which may improve swallowing. SLP will f/u for further swallowing interventions. SLP Visit Diagnosis Dysphagia, oropharyngeal phase (R13.12) Attention and concentration deficit following -- Frontal lobe and executive function deficit following -- Impact on safety and function Moderate aspiration risk   Treatment Recommendations 02/06/2021 Treatment Recommendations Therapy as outlined in treatment plan below   Prognosis 02/06/2021 Prognosis for Safe Diet Advancement Good Barriers to Reach Goals -- Barriers/Prognosis Comment -- Diet Recommendations 02/06/2021 SLP Diet Recommendations Dysphagia 2 (Fine chop) solids;Nectar thick liquid Liquid Administration via Cup;Straw Medication Administration Whole meds with puree Compensations Slow rate;Small sips/bites;Follow solids with liquid;Clear throat intermittently;Effortful swallow;Multiple dry swallows after each bite/sip Postural Changes --   Other Recommendations 02/06/2021 Recommended Consults -- Oral Care Recommendations Oral care BID Other Recommendations -- Follow Up Recommendations Acute inpatient rehab (3hours/day)  Assistance recommended at discharge Frequent or constant Supervision/Assistance Functional Status Assessment Patient has had a recent decline in their functional status and demonstrates the ability to make significant improvements in function in a reasonable and predictable amount of time. Frequency and Duration  02/06/2021 Speech Therapy Frequency (ACUTE ONLY) min 2x/week Treatment Duration --   Oral Phase 02/06/2021 Oral Phase Impaired Oral - Pudding Teaspoon -- Oral - Pudding Cup -- Oral - Honey Teaspoon -- Oral - Honey Cup -- Oral - Nectar Teaspoon Lingual/palatal residue Oral - Nectar Cup Lingual/palatal residue Oral - Nectar Straw Lingual/palatal residue Oral - Thin Teaspoon -- Oral - Thin Cup Lingual/palatal residue Oral - Thin Straw Lingual/palatal residue Oral - Puree  Lingual/palatal residue Oral - Mech Soft Lingual/palatal residue Oral - Regular -- Oral - Multi-Consistency -- Oral - Pill Lingual/palatal residue Oral Phase - Comment --  Pharyngeal Phase 02/06/2021 Pharyngeal Phase Impaired Pharyngeal- Pudding Teaspoon -- Pharyngeal -- Pharyngeal- Pudding Cup -- Pharyngeal -- Pharyngeal- Honey Teaspoon -- Pharyngeal -- Pharyngeal- Honey Cup -- Pharyngeal -- Pharyngeal- Nectar Teaspoon Reduced epiglottic inversion;Reduced tongue base retraction;Reduced airway/laryngeal closure;Pharyngeal residue - valleculae Pharyngeal -- Pharyngeal- Nectar Cup Reduced epiglottic inversion;Reduced tongue base retraction;Reduced airway/laryngeal closure;Penetration/Aspiration during swallow;Trace aspiration;Pharyngeal residue - valleculae Pharyngeal Material enters airway, passes BELOW cords then ejected out;Material enters airway, passes BELOW cords and not ejected out despite cough attempt by patient;Material does not enter airway Pharyngeal- Nectar Straw Reduced epiglottic inversion;Reduced tongue base retraction;Pharyngeal residue - valleculae Pharyngeal -- Pharyngeal- Thin Teaspoon -- Pharyngeal -- Pharyngeal- Thin Cup Penetration/Aspiration during swallow;Penetration/Aspiration before swallow;Reduced epiglottic inversion;Reduced tongue base retraction;Reduced airway/laryngeal closure;Pharyngeal residue - valleculae;Trace aspiration Pharyngeal Material enters airway, passes BELOW cords then ejected out;Material enters airway, passes BELOW cords and not ejected out despite cough attempt by patient;Material does not enter airway Pharyngeal- Thin Straw Penetration/Aspiration during swallow;Penetration/Aspiration before swallow;Reduced epiglottic inversion;Reduced tongue base retraction;Reduced airway/laryngeal closure;Pharyngeal residue - valleculae;Trace aspiration Pharyngeal Material enters airway, passes BELOW cords then ejected out;Material enters airway, passes BELOW cords and not ejected out  despite cough attempt by patient;Material does not enter airway Pharyngeal- Puree Reduced epiglottic inversion;Reduced tongue base retraction;Pharyngeal residue - valleculae Pharyngeal -- Pharyngeal- Mechanical Soft Reduced epiglottic inversion;Reduced tongue base retraction;Pharyngeal residue - valleculae Pharyngeal -- Pharyngeal- Regular -- Pharyngeal -- Pharyngeal- Multi-consistency -- Pharyngeal -- Pharyngeal- Pill -- Pharyngeal -- Pharyngeal Comment --  No flowsheet data found. DeBlois, Katherene Ponto 02/06/2021, 9:56 AM                      Assessment & Plan:   Douglas Spencer was seen today for follow-up.  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  Will continue risk factor modifications and the current vimpat dose.   I have discontinued Elenore Rota Soldo's acetaminophen and amantadine. I am also having him maintain his aspirin, lacosamide, Mavyret, rosuvastatin, and hydrOXYzine.  No orders of the defined types were placed in this encounter.    Follow-up: No follow-ups on file.  Scarlette Calico, MD

## 2021-10-21 ENCOUNTER — Encounter: Payer: Self-pay | Admitting: Internal Medicine

## 2021-10-22 ENCOUNTER — Other Ambulatory Visit (HOSPITAL_COMMUNITY): Payer: Self-pay

## 2021-10-22 DIAGNOSIS — D485 Neoplasm of uncertain behavior of skin: Secondary | ICD-10-CM | POA: Diagnosis not present

## 2021-10-22 DIAGNOSIS — L570 Actinic keratosis: Secondary | ICD-10-CM | POA: Diagnosis not present

## 2021-11-04 ENCOUNTER — Other Ambulatory Visit: Payer: Self-pay | Admitting: Internal Medicine

## 2021-11-04 DIAGNOSIS — G40909 Epilepsy, unspecified, not intractable, without status epilepticus: Secondary | ICD-10-CM

## 2021-11-05 ENCOUNTER — Other Ambulatory Visit: Payer: Self-pay | Admitting: Internal Medicine

## 2021-11-05 ENCOUNTER — Telehealth: Payer: Self-pay

## 2021-11-05 DIAGNOSIS — G40909 Epilepsy, unspecified, not intractable, without status epilepticus: Secondary | ICD-10-CM

## 2021-11-05 MED ORDER — LACOSAMIDE 50 MG PO TABS: 50.0000 mg | ORAL_TABLET | Freq: Two times a day (BID) | ORAL | 0 refills | Status: DC

## 2021-11-05 NOTE — Telephone Encounter (Signed)
MEDICATION: lacosamide (VIMPAT) 50 MG TABS tablet  PHARMACY: [id number]- [place], [place] - [address]   Comments: Patient is completely out   **Let patient know to contact pharmacy at the end of the day to make sure medication is ready. **  ** Please notify patient to allow 48-72 hours to process**  **Encourage patient to contact the pharmacy for refills or they can [facility]*

## 2021-11-06 ENCOUNTER — Other Ambulatory Visit: Payer: Self-pay | Admitting: Internal Medicine

## 2021-11-06 DIAGNOSIS — G40909 Epilepsy, unspecified, not intractable, without status epilepticus: Secondary | ICD-10-CM

## 2021-11-09 NOTE — Progress Notes (Unsigned)
Subjective:    Patient ID: Douglas Spencer, male    DOB: Sep 20, 1945, 76 y.o.   MRN: [id number]  No chief complaint on file.   HPI:  Douglas Spencer is a 76 y.o. male with chronic Hepatitis C last seen by Magda Kiel, PharmD, CPP on 10/04/21 with good adherence and tolerance to [place]. Hepatitis C RNA level was undetectable. Here today for end of treatment visit.    No Known Allergies    Outpatient Medications Prior to Visit  Medication Sig Dispense Refill   aspirin 81 MG chewable tablet Chew 1 tablet (81 mg total) by mouth daily.     Glecaprevir-Pibrentasvir (MAVYRET) 100-40 MG TABS Take 3 tablets by mouth daily with breakfast. 84 tablet 1   hydrOXYzine (ATARAX) 25 MG tablet Take 1 tablet (25 mg total) by mouth 2 (two) times daily as needed for anxiety. 180 tablet 0   lacosamide (VIMPAT) 50 MG TABS tablet TAKE 1 TABLET BY MOUTH TWICE A DAY 180 tablet 0   rosuvastatin (CRESTOR) 5 MG tablet Take 1 tablet (5 mg total) by mouth daily. 30 tablet 2   No facility-administered medications prior to visit.     Past Medical History:  Diagnosis Date   ETOH abuse    Substance abuse ([place])      No past surgical history on file.     Review of Systems  Constitutional:  Negative for chills, diaphoresis, fatigue and fever.  Respiratory:  Negative for cough, chest tightness, shortness of breath and wheezing.   Cardiovascular:  Negative for chest pain.  Gastrointestinal:  Negative for abdominal distention, abdominal pain, constipation, diarrhea, nausea and vomiting.  Neurological:  Negative for weakness and headaches.  Hematological:  Does not bruise/bleed easily.      Objective:    There were no vitals taken for this visit. Nursing note and vital signs reviewed.  Physical Exam Constitutional:      General: He is not in acute distress.    Appearance: He is well-developed.  Cardiovascular:     Rate and Rhythm: Normal rate and regular rhythm.     Heart sounds: Normal  heart sounds. No murmur heard.    No friction rub. No gallop.  Pulmonary:     Effort: Pulmonary effort is normal. No respiratory distress.     Breath sounds: Normal breath sounds. No wheezing or rales.  Chest:     Chest wall: No tenderness.  Abdominal:     General: Bowel sounds are normal. There is no distension.     Palpations: Abdomen is soft. There is no mass.     Tenderness: There is no abdominal tenderness. There is no guarding or rebound.  Skin:    General: Skin is warm and dry.  Neurological:     Mental Status: He is alert and oriented to person, place, and time.  Psychiatric:        Behavior: Behavior normal.        Thought Content: Thought content normal.        Judgment: Judgment normal.         10/17/2021    1:26 PM 03/05/2021    8:30 AM  Depression screen PHQ 2/9  Decreased Interest 0 0  Down, Depressed, Hopeless 0 0  PHQ - 2 Score 0 0  Altered sleeping 0 0  Tired, decreased energy 0 0  Change in appetite 0 0  Feeling bad or failure about yourself  0 0  Trouble concentrating 0 1  Moving slowly or fidgety/restless  0 0  Suicidal thoughts 0 0  PHQ-9 Score 0 1  Difficult doing work/chores Not difficult at all        Assessment & Plan:    Patient Active Problem List   Diagnosis Date Noted   Chronic hepatitis C without hepatic coma ([place]) 07/16/2021   Encounter for general adult medical examination with abnormal findings 07/13/2021   Basal cell carcinoma 07/12/2021   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   No orders of the defined types were placed in this encounter.    Follow-up: No follow-ups on file.   Terri Piedra, MSN, FNP-C Nurse Practitioner [facility] [place] number: [phone]

## 2021-11-12 ENCOUNTER — Encounter: Payer: Self-pay | Admitting: Family

## 2021-11-12 ENCOUNTER — Ambulatory Visit (INDEPENDENT_AMBULATORY_CARE_PROVIDER_SITE_OTHER): Payer: Medicare HMO | Admitting: Family

## 2021-11-12 ENCOUNTER — Other Ambulatory Visit: Payer: Self-pay

## 2021-11-12 VITALS — BP 150/79 | HR 71 | Temp 98.2°F | Ht 68.0 in | Wt 133.0 lb

## 2021-11-12 DIAGNOSIS — R69 Illness, unspecified: Secondary | ICD-10-CM | POA: Diagnosis not present

## 2021-11-12 DIAGNOSIS — B182 Chronic viral hepatitis C: Secondary | ICD-10-CM

## 2021-11-12 MED ORDER — ATORVASTATIN CALCIUM 40 MG PO TABS
40.0000 mg | ORAL_TABLET | Freq: Every day | ORAL | 2 refills | Status: DC
Start: 2021-11-12 — End: 2022-03-15

## 2021-11-12 NOTE — Assessment & Plan Note (Signed)
Douglas Spencer has completed 8 weeks of [place] with no adverse side effects. Feeling well. Check Hepatitis C RNA level. Discussed plan of care to include SVR12 check in 3 months for cure visit. Reviewed previous lab work and likely need for [facility] screening in the future. Plan for follow up in 3 months or sooner if needed with lab work on the same day.

## 2021-11-12 NOTE — Patient Instructions (Addendum)
Nice to see you.  We will check your lab work today.  Plan for follow up in 3 months or sooner if needed with lab work on the same day.  Have a great day and stay safe!

## 2021-11-14 LAB — HEPATITIS C RNA QUANTITATIVE
HCV Quantitative Log: <1.18 log IU/mL
HCV RNA, PCR, QN: <15 IU/mL

## 2021-11-30 ENCOUNTER — Other Ambulatory Visit: Payer: Self-pay | Admitting: Family

## 2021-12-21 ENCOUNTER — Other Ambulatory Visit: Payer: Self-pay

## 2021-12-21 ENCOUNTER — Telehealth: Payer: Self-pay | Admitting: Internal Medicine

## 2021-12-21 DIAGNOSIS — F411 Generalized anxiety disorder: Secondary | ICD-10-CM

## 2021-12-21 MED ORDER — HYDROXYZINE HCL 25 MG PO TABS: 25.0000 mg | ORAL_TABLET | Freq: Two times a day (BID) | ORAL | 0 refills | Status: DC | PRN

## 2021-12-21 NOTE — Telephone Encounter (Signed)
Patient needs a refill on his hydoxizine 25 mg.  Please send to [place], [place]  Patient has appointment 01/29/2022

## 2022-01-29 ENCOUNTER — Encounter: Payer: Self-pay | Admitting: Internal Medicine

## 2022-01-29 ENCOUNTER — Ambulatory Visit (INDEPENDENT_AMBULATORY_CARE_PROVIDER_SITE_OTHER): Payer: Medicare HMO | Admitting: Internal Medicine

## 2022-01-29 VITALS — BP 142/70 | HR 72 | Temp 97.6°F | Ht 68.0 in | Wt 145.4 lb

## 2022-01-29 DIAGNOSIS — G40909 Epilepsy, unspecified, not intractable, without status epilepticus: Secondary | ICD-10-CM

## 2022-01-29 DIAGNOSIS — E785 Hyperlipidemia, unspecified: Secondary | ICD-10-CM

## 2022-01-29 LAB — LIPID PANEL
Cholesterol: 109 mg/dL (ref 0–200)
HDL: 30.6 mg/dL — ABNORMAL LOW (ref >39.00)
LDL Cholesterol: 49 mg/dL (ref 0–99)
NonHDL: 78.15
Total CHOL/HDL Ratio: 4
Triglycerides: 148 mg/dL (ref 0.0–149.0)
VLDL: 29.6 mg/dL (ref 0.0–40.0)

## 2022-01-29 LAB — CBC WITH DIFFERENTIAL/PLATELET
Basophils Absolute: 0 10*3/uL (ref 0.0–0.1)
Basophils Relative: 0.5 % (ref 0.0–3.0)
Eosinophils Absolute: 0.2 10*3/uL (ref 0.0–0.7)
Eosinophils Relative: 3.5 % (ref 0.0–5.0)
HCT: 44 % (ref 39.0–52.0)
Hemoglobin: 14.9 g/dL (ref 13.0–17.0)
Lymphocytes Relative: 35.9 % (ref 12.0–46.0)
Lymphs Abs: 2.6 10*3/uL (ref 0.7–4.0)
MCHC: 33.7 g/dL (ref 30.0–36.0)
MCV: 83.4 fl (ref 78.0–100.0)
Monocytes Absolute: 0.8 10*3/uL (ref 0.1–1.0)
Monocytes Relative: 10.5 % (ref 3.0–12.0)
Neutro Abs: 3.5 10*3/uL (ref 1.4–7.7)
Neutrophils Relative %: 49.6 % (ref 43.0–77.0)
Platelets: 228 10*3/uL (ref 150.0–400.0)
RBC: 5.27 Mil/uL (ref 4.22–5.81)
RDW: 14 % (ref 11.5–15.5)
WBC: 7.1 10*3/uL (ref 4.0–10.5)

## 2022-01-29 LAB — HEPATIC FUNCTION PANEL
ALT: 7 U/L (ref 0–53)
AST: 7 U/L (ref 0–37)
Albumin: 4.3 g/dL (ref 3.5–5.2)
Alkaline Phosphatase: 69 U/L (ref 39–117)
Bilirubin, Direct: 0.1 mg/dL (ref 0.0–0.3)
Total Bilirubin: 0.6 mg/dL (ref 0.2–1.2)
Total Protein: 7.2 g/dL (ref 6.0–8.3)

## 2022-01-29 MED ORDER — LACOSAMIDE 50 MG PO TABS: 50.0000 mg | ORAL_TABLET | Freq: Two times a day (BID) | ORAL | 1 refills | Status: DC

## 2022-01-29 NOTE — Patient Instructions (Signed)
Lacosamide Tablets What is this medication? LACOSAMIDE (la KOE sa mide) prevents and controls seizures in people with epilepsy. It works by calming overactive nerves in your body. This medicine may be used for other purposes; ask your health care provider or pharmacist if you have questions. COMMON BRAND NAME(S): Vimpat What should I tell my care team before I take this medication? They need to know if you have any of these conditions: Heart disease Kidney disease Liver disease Substance use disorder Suicidal thoughts, plans, or attempt An unusual or allergic reaction to lacosamide, other medications, foods, dyes, or preservatives Pregnant or trying to get pregnant Breast-feeding How should I use this medication? Take this medication by mouth with water. Take it as directed on the prescription label at the same time every day. Do not cut, crush or chew this medication. Swallow the tablets whole. You can take it with or without food. If it upsets your stomach, take it with food. Keep taking it unless your care team tells you to stop. A special MedGuide will be given to you by the pharmacist with each prescription and refill. Be sure to read this information carefully each time. Talk to your care team about the use of this medication in children. While it may be prescribed for children as young as 1 month for selected conditions, precautions do apply. Overdosage: If you think you have taken too much of this medicine contact a poison control center or emergency room at once. NOTE: This medicine is only for you. Do not share this medicine with others. What if I miss a dose? If you miss a dose, take it as soon as you can. If it is almost time for your next dose, take only that dose. Do not take double or extra doses. What may interact with this medication? This medication may interact with the following: Atazanavir Beta blockers like metoprolol and propranolol Calcium channel blockers like  diltiazem and verapamil Certain medications for irregular heart beat like amiodarone, bepridil, dofetilide, encainide, flecainide, propafenone, quinidine Certain medications for seizures like carbamazepine, phenytoin Digoxin Dronedarone Lopinavir/ritonavir This list may not describe all possible interactions. Give your health care provider a list of all the medicines, herbs, non-prescription drugs, or dietary supplements you use. Also tell them if you smoke, drink alcohol, or use illegal drugs. Some items may interact with your medicine. What should I watch for while using this medication? Visit your care team for regular checks on your progress. Tell your care team if your symptoms do not start to get better or if they get worse. This medication may cause serious skin reactions. They can happen weeks to months after starting the medication. Contact your care team right away if you notice fevers or flu-like symptoms with a rash. The rash may be red or purple and then turn into blisters or peeling of the skin. Or, you might notice a red rash with swelling of the face, lips or lymph nodes in your neck or under your arms. Wear a medical ID bracelet or chain. Carry a card that describes your condition. List the medications and doses you take on the card. You may get drowsy or dizzy. Do not drive, use machinery, or do anything that needs mental alertness until you know how this medication affects you. Do not stand or sit up quickly, especially if you are an older patient. This reduces the risk of dizzy or fainting spells. Alcohol may interfere with the effect of this medication. Avoid alcoholic drinks. If you  or your family notice any changes in your behavior, such as new or worsening depression, thoughts of harming yourself, anxiety, other unusual or disturbing thoughts, or memory loss, call your care team right away. Patients who become pregnant while using this medication may enroll in the [place] Pregnancy Registry by calling ([phone]. This registry collects information about the safety of antiepileptic medication use during pregnancy. What side effects may I notice from receiving this medication? Side effects that you should report to your care team as soon as possible: Allergic reactions--skin rash, itching, hives, swelling of the face, lips, tongue, or throat Heart rhythm changes--fast or irregular heartbeat, dizziness, feeling faint or lightheaded, chest pain, trouble breathing Rash, fever, and swollen lymph nodes Thoughts of suicide or self-harm, worsening mood, or feelings of depression Side effects that usually do not require medical attention (report to your care team if they continue or are bothersome): Dizziness Double vision Drowsiness Headache Loss of balance or coordination Nausea This list may not describe all possible side effects. Call your doctor for medical advice about side effects. You may report side effects to FDA at 1-800-FDA-1088. Where should I keep my medication? Keep out of the reach of children and pets. This medication can be abused. Keep it in a safe place to protect it from theft. Do not share it with anyone. It is only for you. Selling or giving away this medication is dangerous and against the law. Store at room temperature between 20 and 25 degrees C (68 and 77 degrees F). Get rid of any unused medication after the expiration date. This medication may cause harm and death if it is taken by other adults, children, or pets. It is important to get rid of the medication as soon as you no longer need it or it is expired. You can do this in two ways: Take the medication to a medication take-back program. Check with your pharmacy or law enforcement to find a location. If you cannot return the medication, check the label or package insert to see if the medication should be thrown out in the garbage or flushed down the toilet. If you are  not sure, ask your care team. If it is safe to put it in the trash, empty the medication out of the container. Mix the medication with cat litter, dirt, coffee grounds, or other unwanted substance. Seal the mixture in a bag or container. Put it in the trash. NOTE: This sheet is a summary. It may not cover all possible information. If you have questions about this medicine, talk to your doctor, pharmacist, or health care provider.  2023 Elsevier/Gold Standard (2020-09-22 00:00:00)

## 2022-01-29 NOTE — Progress Notes (Signed)
Subjective:  Patient ID: Douglas Spencer, male    DOB: [birth year]  Age: 76 y.o. MRN: [id number]  CC: Hyperlipidemia   HPI Douglas Spencer presents for f/up -  He is tolerating the statin well with no myalgias or arthralgias.  He has had no recent seizure activity and requests a refill of Vimpat.  He continues to have left lower extremity weakness that is at baseline.  He offers no new neurological complaints.  Outpatient Medications Prior to Visit  Medication Sig Dispense Refill   aspirin 81 MG chewable tablet Chew 1 tablet (81 mg total) by mouth daily.     atorvastatin (LIPITOR) 40 MG tablet Take 1 tablet (40 mg total) by mouth daily. 30 tablet 2   hydrOXYzine (ATARAX) 25 MG tablet Take 1 tablet (25 mg total) by mouth 2 (two) times daily as needed for anxiety. 180 tablet 0   lacosamide (VIMPAT) 50 MG TABS tablet TAKE 1 TABLET BY MOUTH TWICE A DAY 180 tablet 0   No facility-administered medications prior to visit.    ROS Review of Systems  Objective:  BP (!) 142/70 (BP Location: Right Arm, Patient Position: Sitting, Cuff Size: Normal)   Pulse 72   Temp 97.6 F (36.4 C) (Oral)   Ht '5\' 8"'$  (1.727 m)   Wt 145 lb 6.4 oz (66 kg)   SpO2 98%   BMI 22.11 kg/m   BP Readings from Last 3 Encounters:  01/29/22 (!) 142/70  11/12/21 (!) 150/79  10/17/21 130/80    Wt Readings from Last 3 Encounters:  01/29/22 145 lb 6.4 oz (66 kg)  11/12/21 133 lb (60.3 kg)  10/17/21 129 lb (58.5 kg)    Physical Exam Vitals reviewed.  Constitutional:      Appearance: He is ill-appearing.  HENT:     Nose: Nose normal.     Mouth/Throat:     Mouth: Mucous membranes are moist.  Eyes:     General: No scleral icterus.    Conjunctiva/sclera: Conjunctivae normal.  Cardiovascular:     Rate and Rhythm: Normal rate and regular rhythm.     Heart sounds: No murmur heard. Pulmonary:     Effort: Pulmonary effort is normal.     Breath sounds: Examination of the right-middle field reveals rales.  Examination of the left-middle field reveals rales. Examination of the right-lower field reveals rales. Examination of the left-lower field reveals rales. Rales present. No decreased breath sounds, wheezing or rhonchi.  Abdominal:     General: Abdomen is flat.     Palpations: There is no mass.     Tenderness: There is no abdominal tenderness. There is no guarding.     Hernia: No hernia is present.  Musculoskeletal:        General: Normal range of motion.     Cervical back: Neck supple.     Right lower leg: No edema.     Left lower leg: No edema.  Lymphadenopathy:     Cervical: No cervical adenopathy.  Skin:    General: Skin is warm and dry.  Neurological:     Mental Status: Mental status is at baseline.     Motor: Weakness present.     Coordination: Coordination abnormal.     Gait: Gait abnormal.  Psychiatric:        Mood and Affect: Mood normal.        Behavior: Behavior normal.     Lab Results  Component Value Date   WBC 7.1 01/29/2022   HGB 14.9  01/29/2022   HCT 44.0 01/29/2022   PLT 228.0 01/29/2022   GLUCOSE 115 (H) 10/04/2021   CHOL 109 01/29/2022   TRIG 148.0 01/29/2022   HDL 30.60 (L) 01/29/2022   LDLCALC 49 01/29/2022   ALT 7 01/29/2022   AST 7 01/29/2022   NA 141 10/04/2021   K 4.2 10/04/2021   CL 104 10/04/2021   CREATININE 1.22 10/04/2021   BUN 14 10/04/2021   CO2 26 10/04/2021   TSH 0.77 07/12/2021   INR 1.0 08/02/2021   HGBA1C 5.3 01/31/2021    DG Swallowing Func-Speech Pathology  Result Date: 02/06/2021 Table formatting from the original result was not included. Objective Swallowing Evaluation: Type of Study: MBS-Modified Barium Swallow Study  Patient Details Name: Douglas Spencer MRN: [id number] Date of Birth: 25-Apr-1945 Today's Date: 02/06/2021 Time: SLP Start Time (ACUTE ONLY): 0820 -SLP Stop Time (ACUTE ONLY): 0847 SLP Time Calculation (min) (ACUTE ONLY): 27 min Past Medical History: No past medical history on file. Past Surgical History: No past  surgical history on file. HPI: Pt is a 76 y.o. male who presented to the ED secondary to right-sided gaze, left-sided weakness and drift, slurred speech. TNK given. 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 EEG 12/21,12/22 were WNL. 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  Subjective: [birth year] [age] [birth year]  Recommendations for follow up therapy are one component of a multi-disciplinary discharge planning process, led by the attending physician.  Recommendations may be updated based on patient status, additional functional criteria and insurance authorization. Assessment / Plan / Recommendation Clinical Impressions 02/06/2021 Clinical Impression Pt demonstrates moderate oropharyngeal dysphagia with instance of trace sensed aspiration during the swallow with thin and nectar thick liquids with larger boluses, likely due to decreased laryngeal closure. Pt is able to achieve early and firm laryngeal closure with cues to "get ready to swallow" with nectar and thin liquids. Pt also with mild base of tongue weakness and reduced epiglottic deflection leading to mild residue with solids. Pt able to clear most penetrate/aspirate and residue with a throat clear and second swallow. Recommend a dys 2, vegetarian diet with nectar thick liquids. Pt is motivated to eat and following instructions well . Suggest removal of NG tube which may improve swallowing. SLP will f/u for further swallowing interventions. SLP Visit Diagnosis Dysphagia, oropharyngeal phase (R13.12) Attention and concentration deficit following -- Frontal lobe and executive function deficit following -- Impact on safety and function Moderate aspiration risk   Treatment Recommendations 02/06/2021 Treatment Recommendations Therapy as outlined in treatment plan below   Prognosis 02/06/2021  Prognosis for Safe Diet Advancement Good Barriers to Reach Goals -- Barriers/Prognosis Comment -- Diet Recommendations 02/06/2021 SLP Diet Recommendations Dysphagia 2 (Fine chop) solids;Nectar thick liquid Liquid Administration via Cup;Straw Medication Administration Whole meds with puree Compensations Slow rate;Small sips/bites;Follow solids with liquid;Clear throat intermittently;Effortful swallow;Multiple dry swallows after each bite/sip Postural Changes --   Other Recommendations 02/06/2021 Recommended Consults -- Oral Care Recommendations Oral care BID Other Recommendations -- Follow Up Recommendations Acute inpatient rehab (3hours/day) Assistance recommended at discharge Frequent or constant Supervision/Assistance Functional Status Assessment Patient has had a recent decline in their functional status and demonstrates the ability to make significant improvements in function in a reasonable and predictable amount of time. Frequency and Duration  02/06/2021 Speech Therapy Frequency (ACUTE ONLY) min 2x/week Treatment Duration --   Oral  Phase 02/06/2021 Oral Phase Impaired Oral - Pudding Teaspoon -- Oral - Pudding Cup -- Oral - Honey Teaspoon -- Oral - Honey Cup -- Oral - Nectar Teaspoon Lingual/palatal residue Oral - Nectar Cup Lingual/palatal residue Oral - Nectar Straw Lingual/palatal residue Oral - Thin Teaspoon -- Oral - Thin Cup Lingual/palatal residue Oral - Thin Straw Lingual/palatal residue Oral - Puree Lingual/palatal residue Oral - Mech Soft Lingual/palatal residue Oral - Regular -- Oral - Multi-Consistency -- Oral - Pill Lingual/palatal residue Oral Phase - Comment --  Pharyngeal Phase 02/06/2021 Pharyngeal Phase Impaired Pharyngeal- Pudding Teaspoon -- Pharyngeal -- Pharyngeal- Pudding Cup -- Pharyngeal -- Pharyngeal- Honey Teaspoon -- Pharyngeal -- Pharyngeal- Honey Cup -- Pharyngeal -- Pharyngeal- Nectar Teaspoon Reduced epiglottic inversion;Reduced tongue base retraction;Reduced airway/laryngeal  closure;Pharyngeal residue - valleculae Pharyngeal -- Pharyngeal- Nectar Cup Reduced epiglottic inversion;Reduced tongue base retraction;Reduced airway/laryngeal closure;Penetration/Aspiration during swallow;Trace aspiration;Pharyngeal residue - valleculae Pharyngeal Material enters airway, passes BELOW cords then ejected out;Material enters airway, passes BELOW cords and not ejected out despite cough attempt by patient;Material does not enter airway Pharyngeal- Nectar Straw Reduced epiglottic inversion;Reduced tongue base retraction;Pharyngeal residue - valleculae Pharyngeal -- Pharyngeal- Thin Teaspoon -- Pharyngeal -- Pharyngeal- Thin Cup Penetration/Aspiration during swallow;Penetration/Aspiration before swallow;Reduced epiglottic inversion;Reduced tongue base retraction;Reduced airway/laryngeal closure;Pharyngeal residue - valleculae;Trace aspiration Pharyngeal Material enters airway, passes BELOW cords then ejected out;Material enters airway, passes BELOW cords and not ejected out despite cough attempt by patient;Material does not enter airway Pharyngeal- Thin Straw Penetration/Aspiration during swallow;Penetration/Aspiration before swallow;Reduced epiglottic inversion;Reduced tongue base retraction;Reduced airway/laryngeal closure;Pharyngeal residue - valleculae;Trace aspiration Pharyngeal Material enters airway, passes BELOW cords then ejected out;Material enters airway, passes BELOW cords and not ejected out despite cough attempt by patient;Material does not enter airway Pharyngeal- Puree Reduced epiglottic inversion;Reduced tongue base retraction;Pharyngeal residue - valleculae Pharyngeal -- Pharyngeal- Mechanical Soft Reduced epiglottic inversion;Reduced tongue base retraction;Pharyngeal residue - valleculae Pharyngeal -- Pharyngeal- Regular -- Pharyngeal -- Pharyngeal- Multi-consistency -- Pharyngeal -- Pharyngeal- Pill -- Pharyngeal -- Pharyngeal Comment --  No flowsheet data found. DeBlois, Katherene Ponto 02/06/2021, 9:56 AM                      Assessment & Plan:   Foye was seen today for hyperlipidemia.  Diagnoses and all orders for this visit:  Dyslipidemia, goal LDL below 70- LDL goal achieved. Doing well on the statin  -     Lipid panel; Future -     CBC with Differential/Platelet; Future -     Hepatic function panel; Future -     Hepatic function panel -     CBC with Differential/Platelet -     Lipid panel  Nonintractable epilepsy without status epilepticus, unspecified epilepsy type ([place])- He has been seizure-free.  Will continue the current dose of Vimpat. -     lacosamide (VIMPAT) 50 MG TABS tablet; Take 1 tablet (50 mg total) by mouth 2 (two) times daily. -     CBC with Differential/Platelet; Future -     Hepatic function panel; Future -     Hepatic function panel -     CBC with Differential/Platelet   I have changed Maisen Lakey's lacosamide. I am also having him maintain his aspirin, atorvastatin, and hydrOXYzine.  Meds ordered this encounter  Medications   lacosamide (VIMPAT) 50 MG TABS tablet    Sig: Take 1 tablet (50 mg total) by mouth 2 (two) times daily.    Dispense:  180 tablet    Refill:  1  Not to exceed 5 additional fills before 01/13/2022     Follow-up: Return in about 6 months (around 07/31/2022).  Scarlette Calico, MD

## 2022-02-12 ENCOUNTER — Other Ambulatory Visit: Payer: Self-pay

## 2022-02-12 ENCOUNTER — Ambulatory Visit (INDEPENDENT_AMBULATORY_CARE_PROVIDER_SITE_OTHER): Payer: Medicare HMO | Admitting: Family

## 2022-02-12 ENCOUNTER — Encounter: Payer: Self-pay | Admitting: Family

## 2022-02-12 VITALS — BP 146/90 | HR 84 | Temp 97.4°F | Wt 148.2 lb

## 2022-02-12 DIAGNOSIS — R69 Illness, unspecified: Secondary | ICD-10-CM | POA: Diagnosis not present

## 2022-02-12 DIAGNOSIS — B182 Chronic viral hepatitis C: Secondary | ICD-10-CM | POA: Diagnosis not present

## 2022-02-12 NOTE — Patient Instructions (Signed)
Nice to see you.  We will check your lab work today.  Recommend routine liver cancer screening every 6 months with ultrasound with or without lab work (alpha fetoprotein).   If you need to be tested for Hepatitis C in the future a Hepatitis C RNA level or viral load will need to be checked as the Hepatitis C antibody used for initial screening will always be positive.    No additional follow up needed pending lab work.   Have a great day and stay safe!

## 2022-02-12 NOTE — Progress Notes (Signed)
Subjective:    Patient ID: Douglas Spencer, male    DOB: [birth year], 77 y.o.   MRN: [id number]  Chief Complaint  Patient presents with   Follow-up    Hep C     HPI:  Douglas Spencer is a 77 y.o. male with chronic Hepatitis C last seen on 11/12/21 for end of treatment visit having completed 8 weeks of Cowlic with no adverse side effect and Hepatitis C RNA level undetectable presents today for [facility] visit.   Douglas Spencer has been doing well since last office visit with no new concerns/complaints. Denies abdominal pain, nausea, vomiting, fatigue, fever, scleral icterus or jaundice.    No Known Allergies    Outpatient Medications Prior to Visit  Medication Sig Dispense Refill   aspirin 81 MG chewable tablet Chew 1 tablet (81 mg total) by mouth daily.     atorvastatin (LIPITOR) 40 MG tablet Take 1 tablet (40 mg total) by mouth daily. 30 tablet 2   hydrOXYzine (ATARAX) 25 MG tablet Take 1 tablet (25 mg total) by mouth 2 (two) times daily as needed for anxiety. 180 tablet 0   lacosamide (VIMPAT) 50 MG TABS tablet Take 1 tablet (50 mg total) by mouth 2 (two) times daily. 180 tablet 1   No facility-administered medications prior to visit.     Past Medical History:  Diagnosis Date   ETOH abuse    Substance abuse ([place])     History reviewed. No pertinent surgical history.   Review of Systems  Constitutional:  Negative for chills, diaphoresis, fatigue and fever.  Respiratory:  Negative for cough, chest tightness, shortness of breath and wheezing.   Cardiovascular:  Negative for chest pain.  Gastrointestinal:  Negative for abdominal distention, abdominal pain, constipation, diarrhea, nausea and vomiting.  Neurological:  Negative for weakness and headaches.  Hematological:  Does not bruise/bleed easily.      Objective:    BP (!) 146/90   Pulse 84   Temp (!) 97.4 F (36.3 C) (Oral)   Wt 148 lb 3.2 oz (67.2 kg)   SpO2 100%   BMI 22.53 kg/m  Nursing note and vital signs  reviewed.  Physical Exam Constitutional:      General: He is not in acute distress.    Appearance: He is well-developed.  Cardiovascular:     Rate and Rhythm: Normal rate and regular rhythm.     Heart sounds: Normal heart sounds. No murmur heard.    No friction rub. No gallop.  Pulmonary:     Effort: Pulmonary effort is normal. No respiratory distress.     Breath sounds: Normal breath sounds. No wheezing or rales.  Chest:     Chest wall: No tenderness.  Abdominal:     General: Bowel sounds are normal. There is no distension.     Palpations: Abdomen is soft. There is no mass.     Tenderness: There is no abdominal tenderness. There is no guarding or rebound.  Skin:    General: Skin is warm and dry.  Neurological:     Mental Status: He is alert and oriented to person, place, and time.  Psychiatric:        Behavior: Behavior normal.        Thought Content: Thought content normal.        Judgment: Judgment normal.         02/12/2022    2:13 PM 01/29/2022    3:16 PM 10/17/2021    1:26 PM 03/05/2021    8:30  AM  Depression screen PHQ 2/9  Decreased Interest 0 0 0 0  Down, Depressed, Hopeless 0 0 0 0  PHQ - 2 Score 0 0 0 0  Altered sleeping   0 0  Tired, decreased energy   0 0  Change in appetite   0 0  Feeling bad or failure about yourself    0 0  Trouble concentrating   0 1  Moving slowly or fidgety/restless   0 0  Suicidal thoughts   0 0  PHQ-9 Score   0 1  Difficult doing work/chores   Not difficult at all        Assessment & Plan:    Patient Active Problem List   Diagnosis Date Noted   Dyslipidemia, goal LDL below 70 01/29/2022   Chronic hepatitis C without hepatic coma ([place]) 07/16/2021   Encounter for general adult medical examination with abnormal findings 07/13/2021   Basal cell carcinoma 07/12/2021   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 Check Hepatitis C RNA level for sustained viremic response. Recommend routine liver cancer screening every 6 months with ultrasound with or without lab work (alpha fetoprotein) based on fibrosis score of F4. If screening for Hepatitis C in the future a Hepatitis C RNA level or viral load will need to be checked as the Hepatitis C antibody used for initial screening will always be positive. No additional treatment is needed at this time and follow up pending lab work as needed.      Relevant Orders   Hepatitis C RNA quantitative ([facility])     I am having Lupita Leash maintain his aspirin, atorvastatin, hydrOXYzine, and lacosamide.   Follow-up: Pending lab work as needed.    Terri Piedra, MSN, FNP-C Nurse Practitioner [facility] for Infectious [facility] [place] number: [phone]

## 2022-02-12 NOTE — Assessment & Plan Note (Signed)
Douglas Spencer is s/p 8 weeks of Mavyret and continues to do well with no symptoms/adverse side effects. Check Hepatitis C RNA level for sustained viremic response. Recommend routine liver cancer screening every 6 months with ultrasound with or without lab work (alpha fetoprotein) based on fibrosis score of F4. If screening for Hepatitis C in the future a Hepatitis C RNA level or viral load will need to be checked as the Hepatitis C antibody used for initial screening will always be positive. No additional treatment is needed at this time and follow up pending lab work as needed.

## 2022-02-14 LAB — HEPATITIS C RNA QUANTITATIVE
HCV Quantitative Log: <1.18 log IU/mL
HCV RNA, PCR, QN: <15 IU/mL

## 2022-02-21 ENCOUNTER — Ambulatory Visit (INDEPENDENT_AMBULATORY_CARE_PROVIDER_SITE_OTHER): Payer: Medicare HMO

## 2022-02-21 VITALS — Ht 68.0 in | Wt 148.0 lb

## 2022-02-21 DIAGNOSIS — Z0000 Encounter for general adult medical examination without abnormal findings: Secondary | ICD-10-CM

## 2022-02-21 NOTE — Progress Notes (Signed)
Virtual Visit via Telephone Note  I connected with  Douglas Spencer on 02/21/22 at  4:00 PM EST by telephone and verified that I am speaking with the correct person using two identifiers.  Location: Patient: Home Provider: [place] Persons participating in the virtual visit: [place]   I discussed the limitations, risks, security and privacy concerns of performing an evaluation and management service by telephone and the availability of in person appointments. The patient expressed understanding and agreed to proceed.  Interactive audio and video telecommunications were attempted between this nurse and patient, however failed, due to patient having technical difficulties OR patient did not have access to video capability.  We continued and completed visit with audio only.  Some vital signs may be absent or patient reported.   Sheral Flow, LPN  Subjective:   Douglas Spencer is a 77 y.o. male who presents for an Initial Medicare Annual Wellness Visit.  Review of Systems     Cardiac Risk Factors include: advanced age (>89mn, >>31women);dyslipidemia;male gender;sedentary lifestyle     Objective:    Today's Vitals   02/21/22 1622  Weight: 148 lb (67.1 kg)  Height: '5\' 8"'$  (1.727 m)  PainSc: 0-No pain   Body mass index is 22.5 kg/m.     02/21/2022    4:25 PM 02/28/2021    3:53 PM 02/28/2021    2:02 PM 02/06/2021    5:14 PM 01/31/2021   12:00 AM 09/01/2017    2:19 AM  Advanced Directives  Does Patient Have a Medical Advance Directive? No No No No No No  Would patient like information on creating a medical advance directive? No - Patient declined No - Patient declined No - Patient declined No - Patient declined  No - Patient declined    Current Medications (verified) Outpatient Encounter Medications as of 02/21/2022  Medication Sig   aspirin 81 MG chewable tablet Chew 1 tablet (81 mg total) by mouth daily.   atorvastatin (LIPITOR) 40 MG  tablet Take 1 tablet (40 mg total) by mouth daily.   hydrOXYzine (ATARAX) 25 MG tablet Take 1 tablet (25 mg total) by mouth 2 (two) times daily as needed for anxiety.   lacosamide (VIMPAT) 50 MG TABS tablet Take 1 tablet (50 mg total) by mouth 2 (two) times daily.   No facility-administered encounter medications on file as of 02/21/2022.    Allergies (verified) Patient has no known allergies.   History: Past Medical History:  Diagnosis Date   ETOH abuse    Substance [place]    History reviewed. No pertinent surgical history. History reviewed. No pertinent family history. Social History   Socioeconomic History   Marital status: Single    Spouse name: Not on file   Number of children: Not on file   Years of education: Not on file   Highest education level: Not on file  Occupational History   Not on file  Tobacco Use   Smoking status: Former    Packs/day: 0.50    Types: Cigarettes   Smokeless tobacco: Not on file  Vaping Use   Vaping Use: Former  Substance and Sexual Activity   Alcohol use: Not Currently    Comment: pt states he drinks multiple beers every day.   Drug use: Not Currently    Types: Marijuana, Cocaine   Sexual activity: Not Currently  Other Topics Concern   Not on file  Social History Narrative   Not on file   Social Determinants of Health  Financial Resource Strain: Low Risk  (02/21/2022)   Overall Financial Resource Strain (CARDIA)    Difficulty of Paying Living Expenses: Not hard at all  Food Insecurity: No Food Insecurity (02/21/2022)   Hunger Vital Sign    Worried About Running Out of Food in the Last Year: Never true    Ran Out of Food in the Last Year: Never true  Transportation Needs: No Transportation Needs (02/21/2022)   PRAPARE - Hydrologist (Medical): No    Lack of Transportation (Non-Medical): No  Physical Activity: Inactive (02/21/2022)   Exercise Vital Sign    Days of Exercise per Week: 0 days     Minutes of Exercise per Session: 0 min  Stress: No Stress Concern Present (02/21/2022)   [place]    Feeling of Stress : Not at all  Social Connections: Unknown (02/21/2022)   Social Connection and Isolation Panel [NHANES]    Frequency of Communication with Friends and Family: More than three times a week    Frequency of Social Gatherings with Friends and Family: More than three times a week    Attends Religious Services: Patient refused    Marine scientist or Organizations: No    Attends Music therapist: Patient refused    Marital Status: Never married    Tobacco Counseling Counseling given: Not Answered   Clinical Intake:  Pre-visit preparation completed: No  Pain : No/denies pain Pain Score: 0-No pain     BMI - recorded: 22.5 Nutritional Status: BMI of 19-24  Normal Nutritional Risks: None Diabetes: No  How often do you need to have someone help you when you read instructions, pamphlets, or other written materials from your doctor or pharmacy?: 1 - Never What is the last grade level you completed in school?: HSG  Diabetic? No  Interpreter Needed?: No  Information entered by :: Lisette Abu, LPN.   Activities of Daily Living    02/21/2022    4:39 PM  In your present state of health, do you have any difficulty performing the following activities:  Hearing? 0  Vision? 0  Difficulty concentrating or making decisions? 0  Walking or climbing stairs? 0  Dressing or bathing? 0  Doing errands, shopping? 0  Preparing Food and eating ? N  Using the Toilet? N  In the past six months, have you accidently leaked urine? N  Do you have problems with loss of bowel control? N  Managing your Medications? N  Managing your Finances? N  Housekeeping or managing your Housekeeping? N    Patient Care Team: Janith Lima, MD as PCP - General (Internal Medicine) Joseph Art, OD as  Consulting Physician (Optometry)  Indicate any recent Medical Services you may have received from other than [facility] providers in the past year (date may be approximate).     Assessment:   This is a routine wellness examination for Douglas Spencer.  Hearing/Vision screen Hearing Screening - Comments:: Denies hearing difficulties   Vision Screening - Comments:: Wears rx glasses - up to date with routine eye exams with Dr. Joseph Art   Dietary issues and exercise activities discussed: Current Exercise Habits: The patient does not participate in regular exercise at present, Exercise limited by: cardiac condition(s)   Goals Addressed             This Visit's Progress    My goal is to maintain my health, avoid any issues that may  cause another stroke and keep up with my appointments with my providers.        Depression Screen    02/21/2022    4:26 PM 02/12/2022    2:13 PM 01/29/2022    3:16 PM 10/17/2021    1:26 PM 03/05/2021    8:30 AM  PHQ 2/9 Scores  PHQ - 2 Score 0 0 0 0 0  PHQ- 9 Score    0 1    Fall Risk    02/21/2022    4:26 PM 02/12/2022    2:12 PM 01/29/2022    3:15 PM 11/12/2021    2:00 PM 10/17/2021    1:26 PM  [place] in the past year? 1 0 1 1 0  Number falls in past yr: 1 0 1 0 0  Injury with Fall? 1 0 0 0 0  Risk for fall due to : History of fall(s);Impaired balance/gait;Orthopedic patient Impaired balance/gait Impaired balance/gait Impaired balance/gait;History of fall(s) No Fall Risks  Follow up Education provided;Falls prevention discussed Falls evaluation completed Falls evaluation completed Falls evaluation completed Falls evaluation completed    FALL RISK PREVENTION PERTAINING TO THE HOME:  Any stairs in or around the home? Yes  If so, are there any without handrails? No  Home free of loose throw rugs in walkways, pet beds, electrical cords, etc? Yes  Adequate lighting in your home to reduce risk of falls? Yes   ASSISTIVE DEVICES UTILIZED TO PREVENT  FALLS:  Life alert? No  Use of a cane, walker or w/c? No  Grab bars in the bathroom? Yes  Shower chair or bench in shower? Yes  Elevated toilet seat or a handicapped toilet? Yes   TIMED UP AND GO:  Was the test performed? No . Phone Visit  Cognitive Function:        02/21/2022    4:40 PM  6CIT Screen  What Year? 0 points  What month? 0 points  What time? 0 points  Count back from 20 0 points  Months in reverse 0 points  Repeat phrase 0 points  Total Score 0 points    Immunizations Immunization History  Administered Date(s) Administered   Fluad Quad(high Dose 65+) 10/30/2021   PFIZER(Purple Top)SARS-COV-2 Vaccination 03/26/2019   PNEUMOCOCCAL CONJUGATE-20 07/12/2021   Respiratory Syncytial Virus Vaccine,Recomb Aduvanted(Arexvy) 11/08/2021   Td (Adult),5 Lf Tetanus Toxid, Preservative Free 11/08/2021   Zoster Recombinat (Shingrix) 10/30/2021    TDAP status: Up to date  Flu Vaccine status: Up to date  Pneumococcal vaccine status: Up to date  Covid-19 vaccine status: Completed vaccines  Qualifies for Shingles Vaccine? Yes   Zostavax completed No   Shingrix Completed?: No.    Education has been provided regarding the importance of this vaccine. Patient has been advised to call insurance company to determine out of pocket expense if they have not yet received this vaccine. Advised may also receive vaccine at local pharmacy or Health Dept. Verbalized acceptance and understanding.  Screening Tests Health Maintenance  Topic Date Due   COVID-19 Vaccine (2 - Pfizer risk series) 04/16/2019   DTaP/Tdap/Td (1 - Tdap) 11/09/2021   Zoster Vaccines- Shingrix (2 of 2) 12/25/2021   Medicare Annual Wellness (AWV)  02/22/2023   Pneumonia Vaccine 71+ Years old  Completed   INFLUENZA VACCINE  Completed   Hepatitis C Screening  Completed   HPV VACCINES  Aged Out    Health Maintenance  Health Maintenance Due  Topic Date Due   COVID-19 Vaccine (  2 - Pfizer risk series)  04/16/2019   DTaP/Tdap/Td (1 - Tdap) 11/09/2021   Zoster Vaccines- Shingrix (2 of 2) 12/25/2021    Colorectal cancer screening: No longer required.   Lung Cancer Screening: (Low Dose CT Chest recommended if Age 4-80 years, 30 pack-year currently smoking OR have quit w/in 15years.) does not qualify.   Lung Cancer Screening Referral: no  Additional Screening:  Hepatitis C Screening: does qualify; Completed 1/2/20024  Vision Screening: Recommended annual ophthalmology exams for early detection of glaucoma and other disorders of the eye. Is the patient up to date with their annual eye exam?  Yes  Who is the provider or what is the name of the office in which the patient attends annual eye exams? Joseph Art, MD. If pt is not established with a provider, would they like to be referred to a provider to establish care? No .   Dental Screening: Recommended annual dental exams for proper oral hygiene  Community Resource Referral / Chronic Care Management: CRR required this visit?  No   CCM required this visit?  No      Plan:     I have personally reviewed and noted the following in the patient's chart:   Medical and social history Use of alcohol, tobacco or illicit drugs  Current medications and supplements including opioid prescriptions. Patient is not currently taking opioid prescriptions. Functional ability and status Nutritional status Physical activity Advanced directives List of other physicians Hospitalizations, surgeries, and ER visits in previous 12 months Vitals Screenings to include cognitive, depression, and falls Referrals and appointments  In addition, I have reviewed and discussed with patient certain preventive protocols, quality metrics, and best practice recommendations. A written personalized care plan for preventive services as well as general preventive health recommendations were provided to patient.     Sheral Flow, LPN   05/04/4008   Nurse  Notes: N/A

## 2022-02-21 NOTE — Patient Instructions (Signed)
Douglas Spencer , Thank you for taking time to come for your Medicare Wellness Visit. I appreciate your ongoing commitment to your health goals. Please review the following plan we discussed and let me know if I can assist you in the future.   These are the goals we discussed:  Goals      My goal is to maintain my health, avoid any issues that may cause another stroke and keep up with my appointments with my providers.        This is a list of the screening recommended for you and due dates:  Health Maintenance  Topic Date Due   COVID-19 Vaccine (2 - Pfizer risk series) 04/16/2019   Zoster (Shingles) Vaccine (2 of 2) 12/25/2021   Medicare Annual Wellness Visit  02/22/2023   DTaP/Tdap/Td vaccine (2 - Td or Tdap) 11/09/2031   Pneumonia Vaccine  Completed   Flu Shot  Completed   Hepatitis C Screening: USPSTF Recommendation to screen - Ages 18-79 yo.  Completed   HPV Vaccine  Aged Out    Advanced directives: No  Conditions/risks identified: Yes  Next appointment: Follow up in one year for your annual wellness visit.   Preventive Care 71 Years and Older, Male  Preventive care refers to lifestyle choices and visits with your health care provider that can promote health and wellness. What does preventive care include? A yearly physical exam. This is also called an annual well check. Dental exams once or twice a year. Routine eye exams. Ask your health care provider how often you should have your eyes checked. Personal lifestyle choices, including: Daily care of your teeth and gums. Regular physical activity. Eating a healthy diet. Avoiding tobacco and drug use. Limiting alcohol use. Practicing safe sex. Taking low doses of aspirin every day. Taking vitamin and mineral supplements as recommended by your health care provider. What happens during an annual well check? The services and screenings done by your health care provider during your annual well check will depend on your age,  overall health, lifestyle risk factors, and family history of disease. Counseling  Your health care provider may ask you questions about your: Alcohol use. Tobacco use. Drug use. Emotional well-being. Home and relationship well-being. Sexual activity. Eating habits. History of falls. Memory and ability to understand (cognition). Work and work Statistician. Screening  You may have the following tests or measurements: Height, weight, and BMI. Blood pressure. Lipid and cholesterol levels. These may be checked every 5 years, or more frequently if you are over 44 years old. Skin check. Lung cancer screening. You may have this screening every year starting at age 23 if you have a 30-pack-year history of smoking and currently smoke or have quit within the past 15 years. Fecal occult blood test (FOBT) of the stool. You may have this test every year starting at age 35. Flexible sigmoidoscopy or colonoscopy. You may have a sigmoidoscopy every 5 years or a colonoscopy every 10 years starting at age 6. Prostate cancer screening. Recommendations will vary depending on your family history and other risks. Hepatitis C blood test. Hepatitis B blood test. Sexually transmitted disease (STD) testing. Diabetes screening. This is done by checking your blood sugar (glucose) after you have not eaten for a while (fasting). You may have this done every 1-3 years. Abdominal aortic aneurysm (AAA) screening. You may need this if you are a current or former smoker. Osteoporosis. You may be screened starting at age 94 if you are at high risk. Talk  with your health care provider about your test results, treatment options, and if necessary, the need for more tests. Vaccines  Your health care provider may recommend certain vaccines, such as: Influenza vaccine. This is recommended every year. Tetanus, diphtheria, and acellular pertussis (Tdap, Td) vaccine. You may need a Td booster every 10 years. Zoster vaccine.  You may need this after age 49. Pneumococcal 13-valent conjugate (PCV13) vaccine. One dose is recommended after age 29. Pneumococcal polysaccharide (PPSV23) vaccine. One dose is recommended after age 75. Talk to your health care provider about which screenings and vaccines you need and how often you need them. This information is not intended to replace advice given to you by your health care provider. Make sure you discuss any questions you have with your health care provider. Document Released: 02/24/2015 Document Revised: 10/18/2015 Document Reviewed: 11/29/2014 Elsevier Interactive Patient Education  2017 [place] Prevention in the Home Falls can cause injuries. They can happen to people of all ages. There are many things you can do to make your home safe and to help prevent falls. What can I do on the outside of my home? Regularly fix the edges of walkways and driveways and fix any cracks. Remove anything that might make you trip as you walk through a door, such as a raised step or threshold. Trim any bushes or trees on the path to your home. Use bright outdoor lighting. Clear any walking paths of anything that might make someone trip, such as rocks or tools. Regularly check to see if handrails are loose or broken. Make sure that both sides of any steps have handrails. Any raised decks and porches should have guardrails on the edges. Have any leaves, snow, or ice cleared regularly. Use sand or salt on walking paths during winter. Clean up any spills in your garage right away. This includes oil or grease spills. What can I do in the bathroom? Use night lights. Install grab bars by the toilet and in the tub and shower. Do not use towel bars as grab bars. Use non-skid mats or decals in the tub or shower. If you need to sit down in the shower, use a plastic, non-slip stool. Keep the floor dry. Clean up any water that spills on the floor as soon as it happens. Remove soap  buildup in the tub or shower regularly. Attach bath mats securely with double-sided non-slip rug tape. Do not have throw rugs and other things on the floor that can make you trip. What can I do in the bedroom? Use night lights. Make sure that you have a light by your bed that is easy to reach. Do not use any sheets or blankets that are too big for your bed. They should not hang down onto the floor. Have a firm chair that has side arms. You can use this for support while you get dressed. Do not have throw rugs and other things on the floor that can make you trip. What can I do in the kitchen? Clean up any spills right away. Avoid walking on wet floors. Keep items that you use a lot in easy-to-reach places. If you need to reach something above you, use a strong step stool that has a grab bar. Keep electrical cords out of the way. Do not use floor polish or wax that makes floors slippery. If you must use wax, use non-skid floor wax. Do not have throw rugs and other things on the floor that can make you  trip. What can I do with my stairs? Do not leave any items on the stairs. Make sure that there are handrails on both sides of the stairs and use them. Fix handrails that are broken or loose. Make sure that handrails are as long as the stairways. Check any carpeting to make sure that it is firmly attached to the stairs. Fix any carpet that is loose or worn. Avoid having throw rugs at the top or bottom of the stairs. If you do have throw rugs, attach them to the floor with carpet tape. Make sure that you have a light switch at the top of the stairs and the bottom of the stairs. If you do not have them, ask someone to add them for you. What else can I do to help prevent falls? Wear shoes that: Do not have high heels. Have rubber bottoms. Are comfortable and fit you well. Are closed at the toe. Do not wear sandals. If you use a stepladder: Make sure that it is fully opened. Do not climb a closed  stepladder. Make sure that both sides of the stepladder are locked into place. Ask someone to hold it for you, if possible. Clearly mark and make sure that you can see: Any grab bars or handrails. First and last steps. Where the edge of each step is. Use tools that help you move around (mobility aids) if they are needed. These include: Canes. Walkers. Scooters. Crutches. Turn on the lights when you go into a dark area. Replace any light bulbs as soon as they burn out. Set up your furniture so you have a clear path. Avoid moving your furniture around. If any of your floors are uneven, fix them. If there are any pets around you, be aware of where they are. Review your medicines with your doctor. Some medicines can make you feel dizzy. This can increase your chance of falling. Ask your doctor what other things that you can do to help prevent falls. This information is not intended to replace advice given to you by your health care provider. Make sure you discuss any questions you have with your health care provider. Document Released: 11/24/2008 Document Revised: 07/06/2015 Document Reviewed: 03/04/2014 Elsevier Interactive Patient Education  2017 [employer].

## 2022-03-15 ENCOUNTER — Telehealth: Payer: Self-pay

## 2022-03-15 MED ORDER — ATORVASTATIN CALCIUM 40 MG PO TABS: 40.0000 mg | ORAL_TABLET | Freq: Every day | ORAL | 2 refills | Status: DC

## 2022-03-15 NOTE — Telephone Encounter (Signed)
Pt caregiver called in and has stated pt is asking for a refill of his atorvastatin (LIPITOR) 40 MG tablet

## 2022-03-18 ENCOUNTER — Telehealth: Payer: Self-pay | Admitting: Internal Medicine

## 2022-03-18 DIAGNOSIS — F411 Generalized anxiety disorder: Secondary | ICD-10-CM

## 2022-03-18 MED ORDER — HYDROXYZINE HCL 25 MG PO TABS: 25.0000 mg | ORAL_TABLET | Freq: Two times a day (BID) | ORAL | 0 refills | Status: DC | PRN

## 2022-03-18 NOTE — Telephone Encounter (Signed)
MEDICATION: hydrOXYzine (ATARAX) 25 MG tablet   PHARMACY:[id number] [place], [place] - [address]   Comments: Patient's caregiver called and said he was out of his medication. He would like to get that filled ASAP  **Let patient know to contact pharmacy at the end of the day to make sure medication is ready. **  ** Please notify patient to allow 48-72 hours to process**  **Encourage patient to contact the pharmacy for refills or [facility]

## 2022-04-29 ENCOUNTER — Telehealth: Payer: Self-pay | Admitting: Internal Medicine

## 2022-04-29 NOTE — Telephone Encounter (Signed)
Someone, [name], called to let Dr. Ronnald Ramp know that patient had a stroke Friday night. Patient is apparently refusing to go to the hospital. [name] wanted to know if PCP could do anything for pateint. When informed the best route was taking him to the hospital, he hung up.

## 2022-06-14 ENCOUNTER — Other Ambulatory Visit: Payer: Self-pay | Admitting: Internal Medicine

## 2022-06-14 DIAGNOSIS — F411 Generalized anxiety disorder: Secondary | ICD-10-CM

## 2022-06-26 ENCOUNTER — Telehealth: Payer: Self-pay | Admitting: Internal Medicine

## 2022-06-26 ENCOUNTER — Other Ambulatory Visit: Payer: Self-pay | Admitting: Internal Medicine

## 2022-06-26 DIAGNOSIS — E785 Hyperlipidemia, unspecified: Secondary | ICD-10-CM

## 2022-06-26 MED ORDER — ATORVASTATIN CALCIUM 40 MG PO TABS: 40.0000 mg | ORAL_TABLET | Freq: Every day | ORAL | 2 refills | Status: DC

## 2022-06-26 NOTE — Telephone Encounter (Signed)
Prescription Request  06/26/2022  LOV: 01/29/2022  What is the name of the medication or equipment? Atorvastin 40 mg.  Have you contacted your pharmacy to request a refill? Yes   Which pharmacy would you like this sent to?  [id number] [place],  - [address] AT [facility] [address] [place] [address] Phone: [phone] Fax: [phone]   Patient notified that their request is being sent to the clinical staff for review and that they should receive a response within 2 business days.   Please advise at Mobile [phone] (mobile)

## 2022-07-26 ENCOUNTER — Telehealth: Payer: Self-pay | Admitting: Internal Medicine

## 2022-07-26 DIAGNOSIS — I639 Cerebral infarction, unspecified: Secondary | ICD-10-CM

## 2022-07-26 DIAGNOSIS — G40909 Epilepsy, unspecified, not intractable, without status epilepticus: Secondary | ICD-10-CM

## 2022-07-26 NOTE — Telephone Encounter (Signed)
Referral entered  

## 2022-07-26 NOTE — Telephone Encounter (Signed)
Patient needs Referral 2300 placed for [facility] to provide transportation for patient.

## 2022-07-29 ENCOUNTER — Telehealth: Payer: Self-pay | Admitting: *Deleted

## 2022-07-29 NOTE — Telephone Encounter (Signed)
   Telephone encounter was:  Unsuccessful.  07/29/2022 Name: Douglas Spencer MRN: [id number] DOB: 25-Apr-1945  Unsuccessful outbound call made today to assist with:  Transportation Needs   Outreach Attempt:  1st Attempt  A HIPAA compliant voice message was left requesting a return call.  Instructed patient to call back at [phone]  .Patient has medicaid transportation would need to contact the Careers information officer and initiate their transportation benefit , will reachout again   Alois Cliche -[facility] [place], Population Health [phone] [address] [place] , [place] [address] Email : [email or web address]

## 2022-07-30 ENCOUNTER — Telehealth: Payer: Self-pay | Admitting: *Deleted

## 2022-07-30 NOTE — Telephone Encounter (Signed)
   Telephone encounter was:  Unsuccessful.  07/30/2022 Name: Douglas Spencer MRN: [id number] DOB: 04/19/1945  Unsuccessful outbound call made today to assist with:  Transportation Needs   Outreach Attempt:  2nd Attempt  A HIPAA compliant voice message was left requesting a return call.  Instructed patient to call back at [phone]. Yehuda Mao Greenauer -[facility], Population Health [phone] [address] [place] , [place] [address] Email : [email or web address]

## 2022-08-01 ENCOUNTER — Telehealth: Payer: Self-pay | Admitting: *Deleted

## 2022-08-01 NOTE — Telephone Encounter (Signed)
   Telephone encounter was:  Unsuccessful.  08/01/2022 Name: Douglas Spencer MRN: [id number] DOB: 18-Dec-1945  Unsuccessful outbound call made today to assist with:  Transportation Needs   Outreach Attempt:  1st Attempt  A HIPAA compliant voice message was left requesting a return call.  Instructed patient to call back at [phone]  Alois Cliche -[facility], Population Health [phone] [address] [place] , [place] [address] Email : [email or web address]

## 2022-08-01 NOTE — Telephone Encounter (Signed)
   Telephone encounter was:  Unsuccessful.  08/01/2022 Name: Douglas Spencer MRN: [id number] DOB: 26-Nov-1945  Unsuccessful outbound call made today to assist with:  Transportation Needs   Outreach Attempt:  3rd Attempt.  Referral closed unable to contact patient.  A HIPAA compliant voice message was left requesting a return call.  Instructed patient to call back at [phone].  Yehuda Mao Greenauer -[facility] , Population Health [phone] [address] [place] , [place] [address] Email : [email or web address]

## 2022-08-02 ENCOUNTER — Telehealth: Payer: Self-pay | Admitting: *Deleted

## 2022-08-02 NOTE — Telephone Encounter (Signed)
   Telephone encounter was:  Unsuccessful.  08/02/2022 Name: Douglas Spencer MRN: [id number] DOB: [birth year]  Unsuccessful outbound call made today to assist with:  Transportation Needs   Outreach Attempt:  3rd Attempt.  Referral closed unable to contact patient.  A HIPAA compliant voice message was left requesting a return call.  Instructed patient to call back at [phone].  Yehuda Mao Greenauer -[facility] [place], Population Health [phone] [address] [place] , [place] [address] Email : [email or web address]

## 2022-08-07 ENCOUNTER — Ambulatory Visit: Payer: Medicare HMO | Admitting: Internal Medicine

## 2022-08-08 ENCOUNTER — Other Ambulatory Visit: Payer: Self-pay | Admitting: Internal Medicine

## 2022-08-08 DIAGNOSIS — G40909 Epilepsy, unspecified, not intractable, without status epilepticus: Secondary | ICD-10-CM

## 2022-08-09 ENCOUNTER — Other Ambulatory Visit: Payer: Self-pay

## 2022-08-09 ENCOUNTER — Emergency Department (HOSPITAL_COMMUNITY)
Admission: EM | Admit: 2022-08-09 | Discharge: 2022-08-09 | Disposition: A | Discharge status: Home / Self Care | Payer: Medicare HMO | Attending: Emergency Medicine | Admitting: Emergency Medicine

## 2022-08-09 DIAGNOSIS — R6251 Failure to thrive (child): Secondary | ICD-10-CM | POA: Diagnosis not present

## 2022-08-09 DIAGNOSIS — Z20822 Contact with and (suspected) exposure to covid-19: Secondary | ICD-10-CM | POA: Insufficient documentation

## 2022-08-09 DIAGNOSIS — R7309 Other abnormal glucose: Secondary | ICD-10-CM | POA: Insufficient documentation

## 2022-08-09 DIAGNOSIS — I10 Essential (primary) hypertension: Secondary | ICD-10-CM | POA: Diagnosis not present

## 2022-08-09 DIAGNOSIS — R531 Weakness: Secondary | ICD-10-CM | POA: Diagnosis not present

## 2022-08-09 DIAGNOSIS — Z711 Person with feared health complaint in whom no diagnosis is made: Secondary | ICD-10-CM

## 2022-08-09 DIAGNOSIS — R4582 Worries: Secondary | ICD-10-CM | POA: Insufficient documentation

## 2022-08-09 DIAGNOSIS — Z7982 Long term (current) use of aspirin: Secondary | ICD-10-CM | POA: Diagnosis not present

## 2022-08-09 DIAGNOSIS — R000 Tachycardia, unspecified: Secondary | ICD-10-CM | POA: Diagnosis not present

## 2022-08-09 DIAGNOSIS — Z743 Need for continuous supervision: Secondary | ICD-10-CM | POA: Diagnosis not present

## 2022-08-09 DIAGNOSIS — Z7401 Bed confinement status: Secondary | ICD-10-CM | POA: Diagnosis not present

## 2022-08-09 DIAGNOSIS — R9431 Abnormal electrocardiogram [ECG] [EKG]: Secondary | ICD-10-CM | POA: Diagnosis not present

## 2022-08-09 LAB — BASIC METABOLIC PANEL
Anion gap: 10 (ref 5–15)
BUN: 20 mg/dL (ref 8–23)
CO2: 23 mmol/L (ref 22–32)
Calcium: 8.5 mg/dL — ABNORMAL LOW (ref 8.9–10.3)
Chloride: 101 mmol/L (ref 98–111)
Creatinine, Ser: 0.88 mg/dL (ref 0.61–1.24)
GFR, Estimated: >60 mL/min (ref >60)
Glucose, Bld: 101 mg/dL — ABNORMAL HIGH (ref 70–99)
Potassium: 3.4 mmol/L — ABNORMAL LOW (ref 3.5–5.1)
Sodium: 134 mmol/L — ABNORMAL LOW (ref 135–145)

## 2022-08-09 LAB — CBC
HCT: 40.8 % (ref 39.0–52.0)
Hemoglobin: 13.4 g/dL (ref 13.0–17.0)
MCH: 26.9 pg (ref 26.0–34.0)
MCHC: 32.8 g/dL (ref 30.0–36.0)
MCV: 81.9 fL (ref 80.0–100.0)
Platelets: 247 10*3/uL (ref 150–400)
RBC: 4.98 MIL/uL (ref 4.22–5.81)
RDW: 15.3 % (ref 11.5–15.5)
WBC: 6.4 10*3/uL (ref 4.0–10.5)
nRBC: 0 % (ref 0.0–0.2)

## 2022-08-09 LAB — SARS CORONAVIRUS 2 BY RT PCR: SARS Coronavirus 2 by RT PCR: NEGATIVE

## 2022-08-09 LAB — CBG MONITORING, ED: Glucose-Capillary: 99 mg/dL (ref 70–99)

## 2022-08-09 NOTE — Social Work (Signed)
CSW has reached out to [facility] to order wheel chair in which will be delivered tomorrow. No further TOC needs

## 2022-08-09 NOTE — ED Notes (Signed)
PTAR contacted 

## 2022-08-09 NOTE — ED Provider Notes (Signed)
[place] EMERGENCY DEPARTMENT AT [facility] Provider Note   CSN: [id number] Arrival date & time: 08/09/22  1717     History  Chief Complaint  Patient presents with   Failure To Thrive    Quame Vukelich is a 77 y.o. male.  The history is provided by the patient, the EMS personnel and medical records. No language interpreter was used.     77 year old male with significant history polysubstance abuse and alcohol abuse, prior stroke, malnutrition brought here via EMS from home with concerns of failure to thrive.  History obtained through EMS and through patient.  EMS noted that they received a call from DSS about a wellness check as they felt patient is not being cared for appropriately.  Patient states that his friend to contact DSS because he cannot get in touch with patient.  Patient attributed to his phone being dead for the past several weeks.  Patient otherwise without any complaint.  States that he feels safe at home.  He lives with a caregiver and states he receives adequate care there.  He is eating and drinking fine.  He denies any fever chills nausea vomit diarrhea chest pain or shortness of breath but does endorse occasional nonproductive cough.  His friend/caregiver recently had COVID.  Patient states he is up-to-date with all of his immunization including COVID flu and RSV vaccine.  Patient does express that he would like to have access to a wheelchair because he does not have one.  Patient denies being depressed denies any SI or HI.   Home Medications Prior to Admission medications   Medication Sig Start Date End Date Taking? Authorizing Provider  aspirin 81 MG chewable tablet Chew 1 tablet (81 mg total) by mouth daily. 03/05/21   Jones Bales, NP  atorvastatin (LIPITOR) 40 MG tablet Take 1 tablet (40 mg total) by mouth daily. 06/26/22   [name], MD  hydrOXYzine (ATARAX) 25 MG tablet TAKE 1 TABLET BY MOUTH 2 TIMES DAILY AS NEEDED FOR ANXIETY. 06/14/22    [name], MD  lacosamide (VIMPAT) 50 MG TABS tablet TAKE 1 TABLET BY MOUTH TWICE A DAY 08/08/22   [name], MD      Allergies    Patient has no known allergies.    Review of Systems   Review of Systems  All other systems reviewed and are negative.   Physical Exam Updated Vital Signs BP (!) 143/106   Pulse 85   Temp (!) 97 F (36.1 C) (Axillary)   Resp 18   SpO2 100%  Physical Exam Vitals and nursing note reviewed.  Constitutional:      General: He is not in acute distress.    Appearance: He is well-developed.     Comments: Frail-appearing male laying in bed appears to be in no acute discomfort.  Voice is hoarse and difficult to understand.  HENT:     Head: Atraumatic.  Eyes:     Conjunctiva/sclera: Conjunctivae normal.  Cardiovascular:     Rate and Rhythm: Normal rate and regular rhythm.     Pulses: Normal pulses.     Heart sounds: Normal heart sounds.  Pulmonary:     Effort: Pulmonary effort is normal.     Breath sounds: Normal breath sounds. No wheezing or rales.  Abdominal:     Palpations: Abdomen is soft.     Tenderness: There is no abdominal tenderness.  Musculoskeletal:     Cervical back: Normal range of motion and neck  supple.  Skin:    Findings: No rash.  Neurological:     Mental Status: He is alert.     Comments: Left arm contracture, left leg weakness, both are chronic     ED Results / Procedures / Treatments   Labs (all labs ordered are listed, but only abnormal results are displayed) Labs Reviewed  BASIC METABOLIC PANEL - Abnormal; Notable for the following components:      Result Value   Sodium 134 (*)    Potassium 3.4 (*)    Glucose, Bld 101 (*)    Calcium 8.5 (*)    All other components within normal limits  SARS CORONAVIRUS 2 BY RT PCR  CBC  URINALYSIS, ROUTINE W REFLEX MICROSCOPIC  CBG MONITORING, ED    EKG EKG Interpretation Date/Time:  Friday August 09 2022 18:12:13 EDT Ventricular Rate:  87 PR Interval:  163 QRS  Duration:  99 QT Interval:  408 QTC Calculation: 491 R Axis:   25  Text Interpretation: Sinus rhythm new  Borderline prolonged QT interval Confirmed by Gwyneth Sprout ([id number]) on 08/09/2022 6:46:25 PM  Radiology No results found.  Procedures Procedures    Medications Ordered in ED Medications - No data to display  ED Course/ Medical Decision Making/ A&P                             Medical Decision Making Amount and/or Complexity of Data Reviewed Labs: ordered.   BP (!) 143/106   Pulse 85   Temp (!) 97 F (36.1 C) (Axillary)   Resp 18   SpO2 100%   42:81 PM 77 year old male with significant history polysubstance abuse and alcohol abuse, prior stroke, malnutrition brought here via EMS from home with concerns of failure to thrive.  History obtained through EMS and through patient.  EMS noted that they received a call from DSS about a wellness check as they felt patient is not being cared for appropriately.  Patient states that his friend to contact DSS because he cannot get in touch with patient.  Patient attributed to his phone being dead for the past several weeks.  Patient otherwise without any complaint.  States that he feels safe at home.  He lives with a caregiver and states he receives adequate care there.  He is eating and drinking fine.  He denies any fever chills nausea vomit diarrhea chest pain or shortness of breath but does endorse occasional nonproductive cough.  His friend/caregiver recently had COVID.  Patient states he is up-to-date with all of his immunization including COVID flu and RSV vaccine.  Patient does express that he would like to have access to a wheelchair because he does not have one.  Patient denies being depressed denies any SI or HI.  EMS noted patient may have bedbugs At home.  On exam I did not see any concerning rash.  On exam, elderly male, laying in bed appears to be in no acute discomfort.  He is frail-appearing with left-sided weakness  secondary to his prior stroke.  He is without any focal tenderness.  Lungs otherwise clear but he does have occasional cough in the room.  Mentating at baseline.  -Labs ordered, independently viewed and interpreted by me.  Labs remarkable for reassuring lab, covid negative -The patient was maintained on a cardiac monitor.  I personally viewed and interpreted the cardiac monitored which showed an underlying rhythm of: NSR -Imaging including head CT considered but pt  is behaving at baseline, no headache. -This patient presents to the ED for concern of failure to thrive, this involves an extensive number of treatment options, and is a complaint that carries with it a high risk of complications and morbidity.  The differential diagnosis includes malnutrition, electrolytes imbalance, infection -Co morbidities that complicate the patient evaluation includes stroke, substance abuse -Treatment includes reassurance -Reevaluation of the patient after these medicines showed that the patient improved -PCP office notes or outside notes reviewed -Discussion with specialist attending DR. Plunkett -Escalation to admission/observation considered: patients feels much better, is comfortable with discharge, and will follow up with PCP -Prescription medication considered, patient comfortable with OTC meds -Social Determinant of Health considered which includes substance use  Workup overall reassuring.  Patient felt comfortable going home.  He voiced no concern for any kind of abuse at home.  He does request for a wheelchair and states he does not have any access to a wheelchair at home.  I have ordered a wheelchair for patient for which he can pick up at a medical store.        Final Clinical Impression(s) / ED Diagnoses Final diagnoses:  Worried well    Rx / DC Orders ED Discharge Orders          Ordered    For home use only DME standard manual wheelchair with seat cushion       Comments: Patient  suffers from prior stroke which impairs their ability to perform daily activities like bathing and toileting in the home.  A walker will not resolve issue with performing activities of daily living. A wheelchair will allow patient to safely perform daily activities. Patient can safely propel the wheelchair in the home or has a caregiver who can provide assistance. Length of need Lifetime. Accessories: elevating leg rests (ELRs), wheel locks, extensions and anti-tippers.   08/09/22 1902              Fayrene Helper, PA-C 08/09/22 1912    Gwyneth Sprout, MD 08/09/22 [phone]

## 2022-08-09 NOTE — Discharge Instructions (Addendum)
Your labs was obtained today and overall reassuring.  You are stable to return to your home.  I have placed an order for a manual wheelchair that you can pick up at a durable goods medical store.

## 2022-08-09 NOTE — ED Triage Notes (Addendum)
Pt arrived via [facility] from home. EMS states that they received a call from DSS about pt well check because pt is not being cahnged or taken care of. Pt has a hx of stroke with left side deficit. Pt is complaining of weakness and pressure sores.  BP 140/95 O2 96  HR 120 CBG 124  FYI. Pt may have bed bugs at home

## 2022-08-10 NOTE — Care Management (Signed)
    Durable Medical Equipment  (From admission, onward)           Start     Ordered   08/09/22 0000  For home use only DME standard manual wheelchair with seat cushion       Comments: Patient suffers from prior stroke which impairs their ability to perform daily activities like bathing and toileting in the home.  A walker will not resolve issue with performing activities of daily living. A wheelchair will allow patient to safely perform daily activities. Patient can safely propel the wheelchair in the home or has a caregiver who can provide assistance. Length of need Lifetime. Accessories: elevating leg rests (ELRs), wheel locks, extensions and anti-tippers.   08/09/22 1902

## 2022-08-27 ENCOUNTER — Ambulatory Visit: Payer: Medicare HMO | Admitting: Internal Medicine

## 2022-09-11 ENCOUNTER — Telehealth: Payer: Self-pay | Admitting: *Deleted

## 2022-09-19 ENCOUNTER — Ambulatory Visit: Payer: Medicare HMO | Admitting: Internal Medicine

## 2022-09-26 ENCOUNTER — Encounter (INDEPENDENT_AMBULATORY_CARE_PROVIDER_SITE_OTHER): Payer: Self-pay

## 2022-10-23 ENCOUNTER — Other Ambulatory Visit: Payer: Self-pay | Admitting: Internal Medicine

## 2022-10-23 DIAGNOSIS — E785 Hyperlipidemia, unspecified: Secondary | ICD-10-CM

## 2022-10-28 ENCOUNTER — Ambulatory Visit: Payer: Medicare HMO | Admitting: Internal Medicine

## 2022-11-07 ENCOUNTER — Other Ambulatory Visit: Payer: Self-pay | Admitting: Internal Medicine

## 2022-11-07 DIAGNOSIS — G40909 Epilepsy, unspecified, not intractable, without status epilepticus: Secondary | ICD-10-CM

## 2022-11-09 ENCOUNTER — Other Ambulatory Visit: Payer: Self-pay

## 2022-11-09 ENCOUNTER — Emergency Department (HOSPITAL_COMMUNITY): Payer: Medicare HMO

## 2022-11-09 ENCOUNTER — Inpatient Hospital Stay (HOSPITAL_COMMUNITY)
Admission: EM | Admit: 2022-11-09 | Discharge: 2022-11-13 | DRG: 871 | Disposition: A | Discharge status: Hospice | Payer: Medicare HMO | Attending: Internal Medicine | Admitting: Internal Medicine

## 2022-11-09 DIAGNOSIS — E872 Acidosis, unspecified: Secondary | ICD-10-CM | POA: Diagnosis present

## 2022-11-09 DIAGNOSIS — R6520 Severe sepsis without septic shock: Secondary | ICD-10-CM | POA: Diagnosis present

## 2022-11-09 DIAGNOSIS — F419 Anxiety disorder, unspecified: Secondary | ICD-10-CM | POA: Diagnosis present

## 2022-11-09 DIAGNOSIS — A419 Sepsis, unspecified organism: Principal | ICD-10-CM | POA: Diagnosis present

## 2022-11-09 DIAGNOSIS — L89220 Pressure ulcer of left hip, unstageable: Secondary | ICD-10-CM | POA: Diagnosis present

## 2022-11-09 DIAGNOSIS — N179 Acute kidney failure, unspecified: Secondary | ICD-10-CM | POA: Diagnosis present

## 2022-11-09 DIAGNOSIS — G9341 Metabolic encephalopathy: Secondary | ICD-10-CM | POA: Diagnosis present

## 2022-11-09 DIAGNOSIS — R1310 Dysphagia, unspecified: Secondary | ICD-10-CM | POA: Diagnosis present

## 2022-11-09 DIAGNOSIS — D539 Nutritional anemia, unspecified: Secondary | ICD-10-CM | POA: Diagnosis present

## 2022-11-09 DIAGNOSIS — J690 Pneumonitis due to inhalation of food and vomit: Secondary | ICD-10-CM | POA: Diagnosis present

## 2022-11-09 DIAGNOSIS — S71002A Unspecified open wound, left hip, initial encounter: Secondary | ICD-10-CM

## 2022-11-09 DIAGNOSIS — R64 Cachexia: Secondary | ICD-10-CM | POA: Diagnosis present

## 2022-11-09 DIAGNOSIS — Z1152 Encounter for screening for COVID-19: Secondary | ICD-10-CM

## 2022-11-09 DIAGNOSIS — Z515 Encounter for palliative care: Secondary | ICD-10-CM

## 2022-11-09 DIAGNOSIS — Z7982 Long term (current) use of aspirin: Secondary | ICD-10-CM

## 2022-11-09 DIAGNOSIS — R471 Dysarthria and anarthria: Secondary | ICD-10-CM | POA: Diagnosis present

## 2022-11-09 DIAGNOSIS — Z5912 Inadequate housing utilities: Secondary | ICD-10-CM

## 2022-11-09 DIAGNOSIS — Z79899 Other long term (current) drug therapy: Secondary | ICD-10-CM

## 2022-11-09 DIAGNOSIS — R4182 Altered mental status, unspecified: Secondary | ICD-10-CM | POA: Diagnosis not present

## 2022-11-09 DIAGNOSIS — R54 Age-related physical debility: Secondary | ICD-10-CM | POA: Diagnosis present

## 2022-11-09 DIAGNOSIS — Z87891 Personal history of nicotine dependence: Secondary | ICD-10-CM

## 2022-11-09 DIAGNOSIS — Z7401 Bed confinement status: Secondary | ICD-10-CM

## 2022-11-09 DIAGNOSIS — E876 Hypokalemia: Secondary | ICD-10-CM | POA: Diagnosis present

## 2022-11-09 DIAGNOSIS — Z681 Body mass index (BMI) 19 or less, adult: Secondary | ICD-10-CM

## 2022-11-09 DIAGNOSIS — I6930 Unspecified sequelae of cerebral infarction: Secondary | ICD-10-CM

## 2022-11-09 DIAGNOSIS — L89212 Pressure ulcer of right hip, stage 2: Secondary | ICD-10-CM | POA: Diagnosis present

## 2022-11-09 DIAGNOSIS — Z66 Do not resuscitate: Secondary | ICD-10-CM | POA: Diagnosis not present

## 2022-11-09 DIAGNOSIS — G40909 Epilepsy, unspecified, not intractable, without status epilepticus: Secondary | ICD-10-CM | POA: Diagnosis present

## 2022-11-09 DIAGNOSIS — B182 Chronic viral hepatitis C: Secondary | ICD-10-CM | POA: Diagnosis present

## 2022-11-09 DIAGNOSIS — R627 Adult failure to thrive: Secondary | ICD-10-CM | POA: Diagnosis present

## 2022-11-09 DIAGNOSIS — I69354 Hemiplegia and hemiparesis following cerebral infarction affecting left non-dominant side: Secondary | ICD-10-CM

## 2022-11-09 DIAGNOSIS — Z8669 Personal history of other diseases of the nervous system and sense organs: Secondary | ICD-10-CM

## 2022-11-09 DIAGNOSIS — I5032 Chronic diastolic (congestive) heart failure: Secondary | ICD-10-CM | POA: Diagnosis present

## 2022-11-09 LAB — COMPREHENSIVE METABOLIC PANEL
ALT: 11 U/L (ref 0–44)
AST: 18 U/L (ref 15–41)
Albumin: 2.8 g/dL — ABNORMAL LOW (ref 3.5–5.0)
Alkaline Phosphatase: 62 U/L (ref 38–126)
Anion gap: 16 — ABNORMAL HIGH (ref 5–15)
BUN: 30 mg/dL — ABNORMAL HIGH (ref 8–23)
CO2: 23 mmol/L (ref 22–32)
Calcium: 9.1 mg/dL (ref 8.9–10.3)
Chloride: 99 mmol/L (ref 98–111)
Creatinine, Ser: 1.25 mg/dL — ABNORMAL HIGH (ref 0.61–1.24)
GFR, Estimated: 59 mL/min — ABNORMAL LOW (ref >60)
Glucose, Bld: 123 mg/dL — ABNORMAL HIGH (ref 70–99)
Potassium: 3.5 mmol/L (ref 3.5–5.1)
Sodium: 138 mmol/L (ref 135–145)
Total Bilirubin: 1.4 mg/dL — ABNORMAL HIGH (ref 0.3–1.2)
Total Protein: 7 g/dL (ref 6.5–8.1)

## 2022-11-09 LAB — PROTIME-INR
INR: 1.1 (ref 0.8–1.2)
Prothrombin Time: 14.7 s (ref 11.4–15.2)

## 2022-11-09 LAB — CBG MONITORING, ED: Glucose-Capillary: 113 mg/dL — ABNORMAL HIGH (ref 70–99)

## 2022-11-09 LAB — I-STAT CG4 LACTIC ACID, ED: Lactic Acid, Venous: 6.2 mmol/L (ref 0.5–1.9)

## 2022-11-09 LAB — CBC WITH DIFFERENTIAL/PLATELET
Abs Immature Granulocytes: 0.09 10*3/uL — ABNORMAL HIGH (ref 0.00–0.07)
Basophils Absolute: 0.1 10*3/uL (ref 0.0–0.1)
Basophils Relative: 0 %
Eosinophils Absolute: 0.1 10*3/uL (ref 0.0–0.5)
Eosinophils Relative: 0 %
HCT: 43 % (ref 39.0–52.0)
Hemoglobin: 13.8 g/dL (ref 13.0–17.0)
Immature Granulocytes: 1 %
Lymphocytes Relative: 7 %
Lymphs Abs: 1.3 10*3/uL (ref 0.7–4.0)
MCH: 25.6 pg — ABNORMAL LOW (ref 26.0–34.0)
MCHC: 32.1 g/dL (ref 30.0–36.0)
MCV: 79.6 fL — ABNORMAL LOW (ref 80.0–100.0)
Monocytes Absolute: 1.1 10*3/uL — ABNORMAL HIGH (ref 0.1–1.0)
Monocytes Relative: 6 %
Neutro Abs: 16.4 10*3/uL — ABNORMAL HIGH (ref 1.7–7.7)
Neutrophils Relative %: 86 %
Platelets: 376 10*3/uL (ref 150–400)
RBC: 5.4 MIL/uL (ref 4.22–5.81)
RDW: 15 % (ref 11.5–15.5)
WBC: 19.1 10*3/uL — ABNORMAL HIGH (ref 4.0–10.5)
nRBC: 0 % (ref 0.0–0.2)

## 2022-11-09 LAB — SEDIMENTATION RATE: Sed Rate: 68 mm/h — ABNORMAL HIGH (ref 0–16)

## 2022-11-09 LAB — APTT: aPTT: 30 s (ref 24–36)

## 2022-11-09 MED ORDER — SODIUM CHLORIDE 0.9 % IV SOLN
2.0000 g | Freq: Once | INTRAVENOUS | Status: AC
  Administered 2022-11-09: 2 g via INTRAVENOUS
  Filled 2022-11-09: qty 12.5

## 2022-11-09 MED ORDER — METRONIDAZOLE 500 MG/100ML IV SOLN
500.0000 mg | Freq: Once | INTRAVENOUS | Status: AC
  Administered 2022-11-09: 500 mg via INTRAVENOUS
  Filled 2022-11-09: qty 100

## 2022-11-09 MED ORDER — LACTATED RINGERS IV BOLUS (SEPSIS)
1000.0000 mL | Freq: Once | INTRAVENOUS | Status: AC
  Administered 2022-11-09: 1000 mL via INTRAVENOUS

## 2022-11-09 MED ORDER — VANCOMYCIN HCL IN DEXTROSE 1-5 GM/200ML-% IV SOLN
1000.0000 mg | Freq: Once | INTRAVENOUS | Status: AC
  Administered 2022-11-10: 1000 mg via INTRAVENOUS
  Filled 2022-11-09: qty 200

## 2022-11-09 MED ORDER — LACTATED RINGERS IV BOLUS (SEPSIS): 500.0000 mL | Freq: Once | INTRAVENOUS | Status: DC

## 2022-11-09 NOTE — Progress Notes (Signed)
ED Pharmacy Antibiotic Sign Off An antibiotic consult was received from an ED provider for cefepime and vancomycin per pharmacy dosing for sepsis. A chart review was completed to assess appropriateness.   The following one time order(s) were placed:   -Cefepime 2g IV x1 per MD -Vancomycin 1000mg  IV x1 per MD  Further antibiotic and/or antibiotic pharmacy consults should be ordered by the admitting provider if indicated.   Thank you for allowing pharmacy to be a part of this patient's care.   Arabella Merles, [facility]  Clinical Pharmacist 11/09/22 10:33 PM

## 2022-11-09 NOTE — ED Provider Notes (Addendum)
[place] EMERGENCY DEPARTMENT AT [facility] Provider Note   CSN: [id number] Arrival date & time: 11/09/22  1952     History  Chief Complaint  Patient presents with   Altered Mental Status   Failure To Thrive    Douglas Spencer is a 77 y.o. male with a history of stroke, hepatitis C, and substance abuse who presents to the ED today via EMS for altered mental status.  Patient was found by his neighbor about 1 hour prior to arrival with a noted decrease due to altered mental status.  He was given 500 mL NS and 2L [place] by EMS with noted improvement of his vitals.  Patient is bed bound.  EMS found him wrapped in a comforter saturated with urine that was stuck to his left hip wound.  There was also noted that he had ants crawling on his body.  Reports pain to his left hip but denies any other complaints or concerns.  I tried calling patient's neighbor for further history x3 but he did not answer his phone.    Home Medications Prior to Admission medications   Medication Sig Start Date End Date Taking? Authorizing Provider  aspirin 81 MG chewable tablet Chew 1 tablet (81 mg total) by mouth daily. 03/05/21   Jones Bales, NP  atorvastatin (LIPITOR) 40 MG tablet TAKE 1 TABLET BY MOUTH EVERY DAY 10/23/22   [name], MD  hydrOXYzine (ATARAX) 25 MG tablet TAKE 1 TABLET BY MOUTH 2 TIMES DAILY AS NEEDED FOR ANXIETY. 06/14/22   [name], MD  lacosamide (VIMPAT) 50 MG TABS tablet TAKE 1 TABLET BY MOUTH TWICE A DAY 11/07/22   [name], MD      Allergies    Patient has no known allergies.    Review of Systems   Review of Systems  Neurological:        AMS  All other systems reviewed and are negative.   Physical Exam Updated Vital Signs BP 132/89   Pulse (!) 113   Temp 98.1 F (36.7 C) (Axillary)   Resp 20   Wt 43.7 kg   SpO2 92%   BMI 14.66 kg/m  Physical Exam Vitals and nursing note reviewed.  Constitutional:      Comments: Cachetic and unkempt  appearance  HENT:     Head: Normocephalic and atraumatic.     Mouth/Throat:     Mouth: Mucous membranes are moist.  Eyes:     Conjunctiva/sclera: Conjunctivae normal.     Pupils: Pupils are equal, round, and reactive to light.  Cardiovascular:     Rate and Rhythm: Normal rate and regular rhythm.     Pulses: Normal pulses.     Heart sounds: Normal heart sounds.  Pulmonary:     Effort: Pulmonary effort is normal.     Breath sounds: Normal breath sounds.  Abdominal:     Palpations: Abdomen is soft.     Tenderness: There is no abdominal tenderness.  Skin:    General: Skin is warm and dry.     Findings: Lesion present.     Comments: Large wound at left hip, image below  Neurological:     General: No focal deficit present.     Mental Status: He is disoriented.     Comments: Contracted left arm, which is baseline for patient per previous notes.  Psychiatric:        Mood and Affect: Mood normal.        Behavior:  Behavior normal.    ED Results / Procedures / Treatments   Labs (all labs ordered are listed, but only abnormal results are displayed) Labs Reviewed  COMPREHENSIVE METABOLIC PANEL - Abnormal; Notable for the following components:      Result Value   Glucose, Bld 123 (*)    BUN 30 (*)    Creatinine, Ser 1.25 (*)    Albumin 2.8 (*)    Total Bilirubin 1.4 (*)    GFR, Estimated 59 (*)    Anion gap 16 (*)    All other components within normal limits  CBC WITH DIFFERENTIAL/PLATELET - Abnormal; Notable for the following components:   WBC 19.1 (*)    MCV 79.6 (*)    MCH 25.6 (*)    Neutro Abs 16.4 (*)    Monocytes Absolute 1.1 (*)    Abs Immature Granulocytes 0.09 (*)    All other components within normal limits  SEDIMENTATION RATE - Abnormal; Notable for the following components:   Sed Rate 68 (*)    All other components within normal limits  CBG MONITORING, ED - Abnormal; Notable for the following components:   Glucose-Capillary 113 (*)    All other components  within normal limits  I-STAT CG4 LACTIC ACID, ED - Abnormal; Notable for the following components:   Lactic Acid, Venous 6.2 (*)    All other components within normal limits  CULTURE, BLOOD (ROUTINE X 2)  CULTURE, BLOOD (ROUTINE X 2)  RESP PANEL BY RT-PCR (RSV, FLU A&B, COVID)  RVPGX2  PROTIME-INR  APTT  URINALYSIS, W/ REFLEX TO CULTURE (INFECTION SUSPECTED)  I-STAT CG4 LACTIC ACID, ED    EKG EKG Interpretation Date/Time:  Saturday November 09 2022 20:02:04 EDT Ventricular Rate:  127 PR Interval:  147 QRS Duration:  92 QT Interval:  315 QTC Calculation: 458 R Axis:   -5  Text Interpretation: Sinus tachycardia Consider right atrial enlargement Probable anteroseptal infarct, old Nonspecific T abnormalities, lateral leads Since last tracing rate faster Confirmed by Linwood Dibbles [phone]) on 11/09/2022 9:21:16 PM  Radiology DG Hip Unilat W or Wo Pelvis 2-3 Views Left  Result Date: 11/09/2022 CLINICAL DATA:  Left hip wound. EXAM: DG HIP (WITH OR WITHOUT PELVIS) 2-3V LEFT COMPARISON:  None Available. FINDINGS: Evaluation is limited due to patient's positioning. No obvious fracture or dislocation. The bones are osteopenic. The soft tissues are grossly unremarkable. IMPRESSION: 1. No obvious fracture or dislocation. 2. Osteopenia. Electronically Signed   By: Elgie Collard M.D.   On: 11/09/2022 23:13   DG Chest Port 1 View  Result Date: 11/09/2022 CLINICAL DATA:  Question of sepsis.  Failure to thrive. EXAM: PORTABLE CHEST 1 VIEW COMPARISON:  09/01/2017 FINDINGS: Patient positioning limits evaluation. Heart size and pulmonary vascularity are normal. Emphysematous changes and scattered fibrosis in the lungs. No focal consolidation or airspace disease is identified. No pleural effusions. No pneumothorax. Mediastinal contours appear intact. IMPRESSION: Emphysematous changes and scattered fibrosis in the lungs. No focal consolidation. Electronically Signed   By: Burman Nieves M.D.   On:  11/09/2022 21:41    Procedures .Critical Care  Performed by: Maxwell Marion, PA-C Authorized by: Maxwell Marion, PA-C   Critical care provider statement:    Critical care time (minutes):  40   Critical care was necessary to treat or prevent imminent or life-threatening deterioration of the following conditions:  Sepsis   Critical care was time spent personally by me on the following activities:  Development of treatment plan with patient or surrogate, discussions  with consultants, evaluation of patient's response to treatment, examination of patient, ordering and review of laboratory studies, ordering and review of radiographic studies, ordering and performing treatments and interventions, pulse oximetry, re-evaluation of patient's condition and review of old charts   Care discussed with: admitting provider      Medications Ordered in ED Medications  metroNIDAZOLE (FLAGYL) IVPB 500 mg (500 mg Intravenous New Bag/Given 11/09/22 2316)  vancomycin (VANCOCIN) IVPB 1000 mg/200 mL premix (has no administration in time range)  lactated ringers bolus 1,000 mL (0 mLs Intravenous Stopped 11/09/22 2314)    And  lactated ringers bolus 1,000 mL (1,000 mLs Intravenous New Bag/Given 11/09/22 2313)  ceFEPIme (MAXIPIME) 2 g in sodium chloride 0.9 % 100 mL IVPB (0 g Intravenous Stopped 11/09/22 2317)    ED Course/ Medical Decision Making/ A&P                                 Medical Decision Making Amount and/or Complexity of Data Reviewed Radiology: ordered.  Risk Prescription drug management. Decision regarding hospitalization.   This patient presents to the ED for concern of AMS, this involves an extensive number of treatment options, and is a complaint that carries with it a high risk of complications and morbidity.   Differential diagnosis includes: sepsis, severe sepsis, wound infection, UTI, pneumonia, COVID, electrolyte derangement, DKA, HHS, hypoglycemia, hyperglycemia,  etc.   Comorbidities  See HPI   Additional History  Additional history obtained from previous records. I tried calling patient's friend/caretaker 3x with no answer.   Cardiac Monitoring / EKG  The patient was maintained on a cardiac monitor.  I personally viewed and interpreted the cardiac monitored which showed: sinus tachycardia with a heart rate of 127 bpm.   Lab Tests  I ordered and personally interpreted labs.  The pertinent results include:   Initial lactic acid of 6.2, second pending. WBC of 19.1, otherwise CBC is within normal limits. BUN of 30 and creatinine of 1.25, anion gap of 16, otherwise within normal limits. Sed rate of 68 aPTT, PT/INR are unremarkable CBG of 113   Imaging Studies  I ordered imaging studies including CXR and left hip x-ray.  I independently visualized and interpreted imaging which showed:  Emphysematous changes and scattered fibrosis in the lungs without focal consolidation on chest x-ray Left hip x-ray showed grossly unremarkable soft tissue and osteopenia. I agree with the radiologist interpretation   Consultations  I requested consultation with critical care,  and discussed lab and imaging findings as well as pertinent plan - they recommend: Admit to hospitalist since he is hemodynamically stable Spoke with the hospitalist, Dr. Allena Katz, who agreed to admit patient.   Problem List / ED Course / Critical Interventions / Medication Management  AMS, severe sepsis I ordered medications including: Lactated Ringer's for sepsis Cefepime, metronidazole, and vancomycin for empiric antibiotic treatment I have reviewed the patients home medicines and have made adjustments as needed   Social Determinants of Health  Housing   Test / Admission - Considered  Patient admitted to hospitalist service.       Final Clinical Impression(s) / ED Diagnoses Final diagnoses:  Severe sepsis [facility])    Rx / DC Orders ED Discharge Orders      None         Maxwell Marion, PA-C 11/10/22 0014    Maxwell Marion, PA-C 11/10/22 0017    Linwood Dibbles, MD 11/10/22 [phone]

## 2022-11-09 NOTE — Sepsis Progress Note (Signed)
Elink monitoring for the code sepsis protocol.  

## 2022-11-09 NOTE — ED Provider Notes (Incomplete)
[place] EMERGENCY DEPARTMENT AT [facility] Provider Note   CSN: [id number] Arrival date & time: 11/09/22  1952     History {Add pertinent medical, surgical, social history, OB history to HPI:1} Chief Complaint  Patient presents with  . Altered Mental Status  . Failure To Thrive    Douglas Spencer is a 77 y.o. male with a history of stroke, hepatitis C, and substance abuse who presents to the ED today via EMS for altered mental status.       Home Medications Prior to Admission medications   Medication Sig Start Date End Date Taking? Authorizing Provider  aspirin 81 MG chewable tablet Chew 1 tablet (81 mg total) by mouth daily. 03/05/21   Jones Bales, NP  atorvastatin (LIPITOR) 40 MG tablet TAKE 1 TABLET BY MOUTH EVERY DAY 10/23/22   [name], MD  hydrOXYzine (ATARAX) 25 MG tablet TAKE 1 TABLET BY MOUTH 2 TIMES DAILY AS NEEDED FOR ANXIETY. 06/14/22   [name], MD  lacosamide (VIMPAT) 50 MG TABS tablet TAKE 1 TABLET BY MOUTH TWICE A DAY 11/07/22   [name], MD      Allergies    Patient has no known allergies.    Review of Systems   Review of Systems  Neurological:        AMS  All other systems reviewed and are negative.   Physical Exam Updated Vital Signs BP 102/86   Pulse (!) 122   Temp 98.1 F (36.7 C) (Axillary)   Resp (!) 22   Wt 43.7 kg   SpO2 98%   BMI 14.66 kg/m  Physical Exam Vitals and nursing note reviewed.  Constitutional:      Comments: Cachetic and unkempt appearance  HENT:     Head: Normocephalic and atraumatic.     Mouth/Throat:     Mouth: Mucous membranes are moist.  Eyes:     Conjunctiva/sclera: Conjunctivae normal.     Pupils: Pupils are equal, round, and reactive to light.  Cardiovascular:     Rate and Rhythm: Normal rate and regular rhythm.     Pulses: Normal pulses.     Heart sounds: Normal heart sounds.  Pulmonary:     Effort: Pulmonary effort is normal.     Breath sounds: Normal breath sounds.   Abdominal:     Palpations: Abdomen is soft.     Tenderness: There is no abdominal tenderness.  Skin:    General: Skin is warm and dry.     Findings: Lesion present.     Comments: Large wound at left hip, image below  Neurological:     General: No focal deficit present.     Mental Status: He is disoriented.     Comments: Contracted left arm, which is baseline for patient per previous notes.  Psychiatric:        Mood and Affect: Mood normal.        Behavior: Behavior normal.    ED Results / Procedures / Treatments   Labs (all labs ordered are listed, but only abnormal results are displayed) Labs Reviewed  COMPREHENSIVE METABOLIC PANEL - Abnormal; Notable for the following components:      Result Value   Glucose, Bld 123 (*)    BUN 30 (*)    Creatinine, Ser 1.25 (*)    Albumin 2.8 (*)    Total Bilirubin 1.4 (*)    GFR, Estimated 59 (*)    Anion gap 16 (*)    All  other components within normal limits  CBC WITH DIFFERENTIAL/PLATELET - Abnormal; Notable for the following components:   WBC 19.1 (*)    MCV 79.6 (*)    MCH 25.6 (*)    Neutro Abs 16.4 (*)    Monocytes Absolute 1.1 (*)    Abs Immature Granulocytes 0.09 (*)    All other components within normal limits  CBG MONITORING, ED - Abnormal; Notable for the following components:   Glucose-Capillary 113 (*)    All other components within normal limits  I-STAT CG4 LACTIC ACID, ED - Abnormal; Notable for the following components:   Lactic Acid, Venous 6.2 (*)    All other components within normal limits  CULTURE, BLOOD (ROUTINE X 2)  CULTURE, BLOOD (ROUTINE X 2)  PROTIME-INR  APTT  URINALYSIS, W/ REFLEX TO CULTURE (INFECTION SUSPECTED)  SEDIMENTATION RATE  I-STAT CG4 LACTIC ACID, ED    EKG EKG Interpretation Date/Time:  Saturday November 09 2022 20:02:04 EDT Ventricular Rate:  127 PR Interval:  147 QRS Duration:  92 QT Interval:  315 QTC Calculation: 458 R Axis:   -5  Text Interpretation: Sinus tachycardia  Consider right atrial enlargement Probable anteroseptal infarct, old Nonspecific T abnormalities, lateral leads Since last tracing rate faster Confirmed by Linwood Dibbles [id number]) on 11/09/2022 9:21:16 PM  Radiology DG Chest Port 1 View  Result Date: 11/09/2022 CLINICAL DATA:  Question of sepsis.  Failure to thrive. EXAM: PORTABLE CHEST 1 VIEW COMPARISON:  09/01/2017 FINDINGS: Patient positioning limits evaluation. Heart size and pulmonary vascularity are normal. Emphysematous changes and scattered fibrosis in the lungs. No focal consolidation or airspace disease is identified. No pleural effusions. No pneumothorax. Mediastinal contours appear intact. IMPRESSION: Emphysematous changes and scattered fibrosis in the lungs. No focal consolidation. Electronically Signed   By: Burman Nieves M.D.   On: 11/09/2022 21:41    Procedures Procedures {Document cardiac monitor, telemetry assessment procedure when appropriate:1}  Medications Ordered in ED Medications  lactated ringers bolus 1,000 mL (1,000 mLs Intravenous New Bag/Given 11/09/22 2206)    And  lactated ringers bolus 1,000 mL (has no administration in time range)    ED Course/ Medical Decision Making/ A&P   {   Click here for ABCD2, HEART and other calculatorsREFRESH Note before signing :1}                              Medical Decision Making  This patient presents to the ED for concern of AMS, this involves an extensive number of treatment options, and is a complaint that carries with it a high risk of complications and morbidity.   Differential diagnosis includes: ***   Comorbidities  See HPI   Additional History  Additional history obtained from previous records. I tried calling patient's friend/caretaker 3x with no answer.   Cardiac Monitoring / EKG  The patient was maintained on a cardiac monitor.  I personally viewed and interpreted the cardiac monitored which showed: sinus tachycardia with a heart rate of 127  bpm.   Lab Tests  I ordered and personally interpreted labs.  The pertinent results include:   Initial lactic acid of 6.2 WBC of 19.1, otherwise CBC is within normal limits. BUN of 30 and creatinine of 1.25, anion gap of 16, otherwise within normal limits. Sed rate of 68 aPTT, PT/INR are unremarkable CBG of 113   Imaging Studies  I ordered imaging studies including CXR and left hip x-ray.  I independently visualized and  interpreted imaging which showed:  Emphysematous changes and scattered fibrosis in the lungs without focal consolidation on chest x-ray  I agree with the radiologist interpretation   Consultations  I requested consultation with ***,  and discussed lab and imaging findings as well as pertinent plan - they recommend: ***   Problem List / ED Course / Critical Interventions / Medication Management  AMS, severe sepsis I ordered medications including: Lactated Ringer's for sepsis Cefepime, metronidazole, and vancomycin for empiric antibiotic treatment Reevaluation of the patient after these medicines showed that the patient {resolved/improved/worsened:23923::"improved"} I have reviewed the patients home medicines and have made adjustments as needed   Social Determinants of Health  Housing   Test / Admission - Considered  ***   {Document critical care time when appropriate:1} {Document review of labs and clinical decision tools ie heart score, Chads2Vasc2 etc:1}  {Document your independent review of radiology images, and any outside records:1} {Document your discussion with family members, caretakers, and with consultants:1} {Document social determinants of health affecting pt's care:1} {Document your decision making why or why not admission, treatments were needed:1} Final Clinical Impression(s) / ED Diagnoses Final diagnoses:  None    Rx / DC Orders ED Discharge Orders     None

## 2022-11-09 NOTE — ED Triage Notes (Signed)
Patient BIB EMS for evaluation of altered mental status.  Per report, patient lives at home with a caregiver.  Caregiver reports he noticed a decrease in mental status x 1 hour.  Was given 500 mL NS and placed on 2 L [place] with improvement in vital signs.  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  Currently bed bound.  Appears unkept, smells strongly of urine, pressure wound noted to L hip, and dry mucous membranes.  Patient was wrapped in urine saturated comforter on arrival.  Comforter was stuck to wounds.  Pt with ants crawling all over body.

## 2022-11-09 NOTE — ED Notes (Signed)
Mepilex placed on pts L hip wound and L elbow wound. Per PA Ladona Ridgel okay to hold in & out cath and wait for pt to void using external catheter.

## 2022-11-09 NOTE — ED Notes (Signed)
Rectal 101.0

## 2022-11-09 NOTE — ED Notes (Signed)
Patient given partial bed bath on arrival.  Ants removed.  New sheets and pads placed.  Gown and warm blankets provided.

## 2022-11-10 ENCOUNTER — Inpatient Hospital Stay (HOSPITAL_COMMUNITY): Payer: Medicare HMO

## 2022-11-10 DIAGNOSIS — N179 Acute kidney failure, unspecified: Secondary | ICD-10-CM | POA: Diagnosis present

## 2022-11-10 DIAGNOSIS — Z789 Other specified health status: Secondary | ICD-10-CM | POA: Diagnosis not present

## 2022-11-10 DIAGNOSIS — G40909 Epilepsy, unspecified, not intractable, without status epilepticus: Secondary | ICD-10-CM | POA: Diagnosis present

## 2022-11-10 DIAGNOSIS — Z8669 Personal history of other diseases of the nervous system and sense organs: Secondary | ICD-10-CM

## 2022-11-10 DIAGNOSIS — I69354 Hemiplegia and hemiparesis following cerebral infarction affecting left non-dominant side: Secondary | ICD-10-CM | POA: Diagnosis not present

## 2022-11-10 DIAGNOSIS — Z681 Body mass index (BMI) 19 or less, adult: Secondary | ICD-10-CM | POA: Diagnosis not present

## 2022-11-10 DIAGNOSIS — Z66 Do not resuscitate: Secondary | ICD-10-CM | POA: Diagnosis not present

## 2022-11-10 DIAGNOSIS — L89212 Pressure ulcer of right hip, stage 2: Secondary | ICD-10-CM | POA: Diagnosis present

## 2022-11-10 DIAGNOSIS — R64 Cachexia: Secondary | ICD-10-CM | POA: Diagnosis present

## 2022-11-10 DIAGNOSIS — G9341 Metabolic encephalopathy: Secondary | ICD-10-CM | POA: Diagnosis present

## 2022-11-10 DIAGNOSIS — R6520 Severe sepsis without septic shock: Secondary | ICD-10-CM | POA: Diagnosis present

## 2022-11-10 DIAGNOSIS — E876 Hypokalemia: Secondary | ICD-10-CM | POA: Diagnosis present

## 2022-11-10 DIAGNOSIS — Z515 Encounter for palliative care: Secondary | ICD-10-CM | POA: Diagnosis not present

## 2022-11-10 DIAGNOSIS — R1310 Dysphagia, unspecified: Secondary | ICD-10-CM | POA: Diagnosis present

## 2022-11-10 DIAGNOSIS — A419 Sepsis, unspecified organism: Secondary | ICD-10-CM | POA: Diagnosis present

## 2022-11-10 DIAGNOSIS — R627 Adult failure to thrive: Secondary | ICD-10-CM | POA: Diagnosis present

## 2022-11-10 DIAGNOSIS — F419 Anxiety disorder, unspecified: Secondary | ICD-10-CM | POA: Diagnosis present

## 2022-11-10 DIAGNOSIS — E872 Acidosis, unspecified: Secondary | ICD-10-CM | POA: Diagnosis present

## 2022-11-10 DIAGNOSIS — S71002A Unspecified open wound, left hip, initial encounter: Secondary | ICD-10-CM

## 2022-11-10 DIAGNOSIS — L89220 Pressure ulcer of left hip, unstageable: Secondary | ICD-10-CM | POA: Diagnosis present

## 2022-11-10 DIAGNOSIS — Z7401 Bed confinement status: Secondary | ICD-10-CM

## 2022-11-10 DIAGNOSIS — Z1152 Encounter for screening for COVID-19: Secondary | ICD-10-CM | POA: Diagnosis not present

## 2022-11-10 DIAGNOSIS — Z7189 Other specified counseling: Secondary | ICD-10-CM | POA: Diagnosis not present

## 2022-11-10 DIAGNOSIS — I6930 Unspecified sequelae of cerebral infarction: Secondary | ICD-10-CM

## 2022-11-10 DIAGNOSIS — R569 Unspecified convulsions: Secondary | ICD-10-CM | POA: Diagnosis not present

## 2022-11-10 DIAGNOSIS — Z5912 Inadequate housing utilities: Secondary | ICD-10-CM | POA: Diagnosis not present

## 2022-11-10 DIAGNOSIS — I5032 Chronic diastolic (congestive) heart failure: Secondary | ICD-10-CM | POA: Diagnosis present

## 2022-11-10 DIAGNOSIS — B182 Chronic viral hepatitis C: Secondary | ICD-10-CM | POA: Diagnosis present

## 2022-11-10 DIAGNOSIS — D539 Nutritional anemia, unspecified: Secondary | ICD-10-CM | POA: Diagnosis present

## 2022-11-10 DIAGNOSIS — J690 Pneumonitis due to inhalation of food and vomit: Secondary | ICD-10-CM | POA: Diagnosis present

## 2022-11-10 DIAGNOSIS — R4182 Altered mental status, unspecified: Secondary | ICD-10-CM | POA: Diagnosis present

## 2022-11-10 LAB — URINALYSIS, W/ REFLEX TO CULTURE (INFECTION SUSPECTED)
Bilirubin Urine: NEGATIVE
Glucose, UA: NEGATIVE mg/dL
Hgb urine dipstick: NEGATIVE
Ketones, ur: 5 mg/dL — AB
Leukocytes,Ua: NEGATIVE
Nitrite: NEGATIVE
Protein, ur: 30 mg/dL — AB
Specific Gravity, Urine: 1.027 (ref 1.005–1.030)
pH: 5 (ref 5.0–8.0)

## 2022-11-10 LAB — GLUCOSE, CAPILLARY: Glucose-Capillary: 100 mg/dL — ABNORMAL HIGH (ref 70–99)

## 2022-11-10 LAB — I-STAT CG4 LACTIC ACID, ED: Lactic Acid, Venous: 4.7 mmol/L (ref 0.5–1.9)

## 2022-11-10 LAB — RESP PANEL BY RT-PCR (RSV, FLU A&B, COVID)  RVPGX2
Influenza A by PCR: NEGATIVE
Influenza B by PCR: NEGATIVE
Resp Syncytial Virus by PCR: NEGATIVE
SARS Coronavirus 2 by RT PCR: NEGATIVE

## 2022-11-10 LAB — CBC
HCT: 37.8 % — ABNORMAL LOW (ref 39.0–52.0)
Hemoglobin: 12.2 g/dL — ABNORMAL LOW (ref 13.0–17.0)
MCH: 25.5 pg — ABNORMAL LOW (ref 26.0–34.0)
MCHC: 32.3 g/dL (ref 30.0–36.0)
MCV: 78.9 fL — ABNORMAL LOW (ref 80.0–100.0)
Platelets: 257 10*3/uL (ref 150–400)
RBC: 4.79 MIL/uL (ref 4.22–5.81)
RDW: 14.8 % (ref 11.5–15.5)
WBC: 12.3 10*3/uL — ABNORMAL HIGH (ref 4.0–10.5)
nRBC: 0 % (ref 0.0–0.2)

## 2022-11-10 LAB — IRON AND TIBC
Iron: 13 ug/dL — ABNORMAL LOW (ref 45–182)
Saturation Ratios: 9 % — ABNORMAL LOW (ref 17.9–39.5)
TIBC: 146 ug/dL — ABNORMAL LOW (ref 250–450)
UIBC: 133 ug/dL

## 2022-11-10 LAB — COMPREHENSIVE METABOLIC PANEL
ALT: 11 U/L (ref 0–44)
AST: 15 U/L (ref 15–41)
Albumin: 2.4 g/dL — ABNORMAL LOW (ref 3.5–5.0)
Alkaline Phosphatase: 56 U/L (ref 38–126)
Anion gap: 9 (ref 5–15)
BUN: 25 mg/dL — ABNORMAL HIGH (ref 8–23)
CO2: 24 mmol/L (ref 22–32)
Calcium: 8.5 mg/dL — ABNORMAL LOW (ref 8.9–10.3)
Chloride: 103 mmol/L (ref 98–111)
Creatinine, Ser: 0.94 mg/dL (ref 0.61–1.24)
GFR, Estimated: >60 mL/min (ref >60)
Glucose, Bld: 105 mg/dL — ABNORMAL HIGH (ref 70–99)
Potassium: 3.8 mmol/L (ref 3.5–5.1)
Sodium: 136 mmol/L (ref 135–145)
Total Bilirubin: 1.5 mg/dL — ABNORMAL HIGH (ref 0.3–1.2)
Total Protein: 5.9 g/dL — ABNORMAL LOW (ref 6.5–8.1)

## 2022-11-10 LAB — PROCALCITONIN: Procalcitonin: 0.65 ng/mL

## 2022-11-10 LAB — LACTIC ACID, PLASMA: Lactic Acid, Venous: 2.7 mmol/L (ref 0.5–1.9)

## 2022-11-10 LAB — FERRITIN: Ferritin: 514 ng/mL — ABNORMAL HIGH (ref 24–336)

## 2022-11-10 LAB — VITAMIN B12: Vitamin B-12: 295 pg/mL (ref 180–914)

## 2022-11-10 LAB — FOLATE: Folate: 4.1 ng/mL — ABNORMAL LOW (ref >5.9)

## 2022-11-10 MED ORDER — LACOSAMIDE 50 MG PO TABS: 50.0000 mg | ORAL_TABLET | Freq: Two times a day (BID) | ORAL | Status: DC

## 2022-11-10 MED ORDER — ORAL CARE MOUTH RINSE: 15.0000 mL | OROMUCOSAL | Status: DC | PRN

## 2022-11-10 MED ORDER — ASPIRIN 81 MG PO CHEW
81.0000 mg | CHEWABLE_TABLET | Freq: Every day | ORAL | Status: DC
  Administered 2022-11-11 – 2022-11-12 (×2): 81 mg via ORAL
  Filled 2022-11-10 (×2): qty 1

## 2022-11-10 MED ORDER — SODIUM CHLORIDE 0.9 % IV SOLN
5.0000 mg | Freq: Every day | INTRAVENOUS | Status: DC
  Administered 2022-11-10 – 2022-11-12 (×3): 5 mg via INTRAVENOUS
  Filled 2022-11-10 (×3): qty 1

## 2022-11-10 MED ORDER — ATORVASTATIN CALCIUM 40 MG PO TABS
40.0000 mg | ORAL_TABLET | Freq: Every day | ORAL | Status: DC
  Administered 2022-11-11 – 2022-11-12 (×2): 40 mg via ORAL
  Filled 2022-11-10 (×2): qty 1

## 2022-11-10 MED ORDER — VANCOMYCIN HCL 500 MG/100ML IV SOLN
500.0000 mg | INTRAVENOUS | Status: DC
  Administered 2022-11-11 – 2022-11-12 (×2): 500 mg via INTRAVENOUS
  Filled 2022-11-10 (×2): qty 100

## 2022-11-10 MED ORDER — HEPARIN SODIUM (PORCINE) 5000 UNIT/ML IJ SOLN
5000.0000 [IU] | Freq: Three times a day (TID) | INTRAMUSCULAR | Status: DC
  Administered 2022-11-10 – 2022-11-12 (×8): 5000 [IU] via SUBCUTANEOUS
  Filled 2022-11-10 (×8): qty 1

## 2022-11-10 MED ORDER — IOHEXOL 350 MG/ML SOLN
75.0000 mL | Freq: Once | INTRAVENOUS | Status: AC | PRN
  Administered 2022-11-10: 75 mL via INTRAVENOUS

## 2022-11-10 MED ORDER — THIAMINE HCL 100 MG/ML IJ SOLN
100.0000 mg | Freq: Every day | INTRAMUSCULAR | Status: DC
  Administered 2022-11-10 – 2022-11-12 (×3): 100 mg via INTRAVENOUS
  Filled 2022-11-10 (×3): qty 2

## 2022-11-10 MED ORDER — CYANOCOBALAMIN 1000 MCG/ML IJ SOLN: 1000.0000 ug | Freq: Every day | INTRAMUSCULAR | Status: DC

## 2022-11-10 MED ORDER — SODIUM CHLORIDE 0.9 % IV SOLN
50.0000 mg | Freq: Two times a day (BID) | INTRAVENOUS | Status: DC
  Administered 2022-11-10 – 2022-11-13 (×7): 50 mg via INTRAVENOUS
  Filled 2022-11-10 (×12): qty 5

## 2022-11-10 MED ORDER — CYANOCOBALAMIN 1000 MCG/ML IJ SOLN
1000.0000 ug | Freq: Every day | INTRAMUSCULAR | Status: DC
  Administered 2022-11-10: 1000 ug via SUBCUTANEOUS
  Filled 2022-11-10: qty 1

## 2022-11-10 MED ORDER — DEXTROSE IN LACTATED RINGERS 5 % IV SOLN: INTRAVENOUS | Status: DC

## 2022-11-10 MED ORDER — VANCOMYCIN HCL 750 MG/150ML IV SOLN: 750.0000 mg | INTRAVENOUS | Status: DC

## 2022-11-10 MED ORDER — SODIUM CHLORIDE 0.9 % IV SOLN
2.0000 g | Freq: Two times a day (BID) | INTRAVENOUS | Status: DC
  Administered 2022-11-10 – 2022-11-12 (×5): 2 g via INTRAVENOUS
  Filled 2022-11-10 (×5): qty 12.5

## 2022-11-10 MED ORDER — METRONIDAZOLE 500 MG/100ML IV SOLN
500.0000 mg | Freq: Two times a day (BID) | INTRAVENOUS | Status: DC
  Administered 2022-11-10 – 2022-11-12 (×4): 500 mg via INTRAVENOUS
  Filled 2022-11-10 (×4): qty 100

## 2022-11-10 MED ORDER — HYDROXYZINE HCL 25 MG PO TABS: 25.0000 mg | ORAL_TABLET | Freq: Two times a day (BID) | ORAL | Status: DC | PRN

## 2022-11-10 MED ORDER — LACTATED RINGERS IV SOLN: INTRAVENOUS | Status: DC

## 2022-11-10 MED ORDER — SODIUM CHLORIDE 0.9% FLUSH
3.0000 mL | Freq: Two times a day (BID) | INTRAVENOUS | Status: DC
  Administered 2022-11-10 – 2022-11-12 (×6): 3 mL via INTRAVENOUS

## 2022-11-10 NOTE — ED Notes (Signed)
This RN & Lizzie CNA repositioned pt in bed to R side. No needs expressed at this time.

## 2022-11-10 NOTE — Progress Notes (Signed)
Patient arrived to floor via stretcher. Alert and oriented x3, reports being in the hospital but unsure of which one. Multiple wounds including bilateral hips, left elbow, and scattered abrasions to back. LUE and LLE contracted. Condom catheter in place. Mepilex applied to wounds, bilateral heels, and bony prominences. Patient remains NPO awaiting SLP evaluation, this nurse asked Dr. Elvera Lennox to order CBGs while NPO. CBGs ordered q6h. Call bell within reach, bed alarm in place.

## 2022-11-10 NOTE — Progress Notes (Signed)
Attempted to call patient's caregiver/roommate, [name], to give update but voicemail received. HIPAA compliant voicemail left.

## 2022-11-10 NOTE — ED Notes (Signed)
Pt repositioned off L side again. Pillows used to support pt on back. Pt tolerated repositioning at this time. No needs identified at this time.

## 2022-11-10 NOTE — Care Management (Signed)
Patients caregiver Mr. Lenon Ahmadi called to get an update, message sent to nursing , His number is corrected in the chart [phone]

## 2022-11-10 NOTE — ED Notes (Signed)
Pt repositioned in bed. No needs expressed at this time.

## 2022-11-10 NOTE — Progress Notes (Signed)
Pharmacy Antibiotic Note  Douglas Spencer is a 77 y.o. male admitted on 11/09/2022 with altered mental status and large hip wound with sepsis concerns.  Pharmacy has been consulted for cefepime and vancomycin dosing.   WBC 19.1 >> 12.3, sCr 1.25 >> 0.94, lactate 6.2 >> 2.7, afebrile  Plan: Cefepime 2g IV every 12 hours Vancomycin 1000mg  IV x1 ----Change vancomycin 750 q48h to 500mg  q24h to accommodate change in renal function since originally dosed. (TBW; eAUC 422.3; Scr 0.94) Flagyl 500mg  IV every 12 hours per MD Monitor renal function Follow up signs of clinical improvement, LOT, de-escalation of antibiotics  Height: 5\' 8"  (172.7 cm) Weight: 43.7 kg (96 lb 5.5 oz) IBW/kg (Calculated) : 68.4  Temp (24hrs), Avg:98.3 F (36.8 C), Min:97.8 F (36.6 C), Max:98.7 F (37.1 C)  Recent Labs  Lab 11/09/22 2108 11/09/22 2124 11/10/22 0029 11/10/22 0340  WBC 19.1*  --   --  12.3*  CREATININE 1.25*  --   --  0.94  LATICACIDVEN  --  6.2* 4.7* 2.7*    Estimated Creatinine Clearance: 40 mL/min (by C-G formula based on SCr of 0.94 mg/dL).    No Known Allergies  Antimicrobials this admission: Cefepime 9/28 >>  Vancomycin 9/28 >>  Flagyl 9/28 >>  Microbiology results: 9/28 BCx:     Delmar Landau, PharmD, BCPS 11/10/2022 12:56 PM ED Clinical Pharmacist -  [phone]

## 2022-11-10 NOTE — Progress Notes (Signed)
PROGRESS NOTE  Douglas Spencer ZOX:[id number] DOB: Jun 13, 1945 DOA: 11/09/2022 PCP: [name], MD   LOS: 0 days   Brief Narrative / Interim history: Douglas Spencer is a 77 y.o. male with medical history significant for history of CVA with residual left-sided weakness, dysarthria, cachectic, bedbound status, history of seizures, anxiety, former substance use who presented to the ED from home with altered mental status.  Currently living at home with a roommate who takes care of him.  He is immobile and bedbound.  He was confused for the past hour, EMS was called, and when they arrived he was retinae urine saturated blanket stuck to his wounds and had ants crawling all over his body.  Reports poor appetite.  He was found to be septic in the ER with tachycardia, leukocytosis, elevated lactic acid and was placed on broad-spectrum antibiotics and admitted to the hospital  Subjective / 24h Interval events: No complaints this morning, denies pain. Doesn't engage in conversation. Keeps his eyes closed  Assesement and Plan: Principal Problem:   Severe sepsis with lactic acidosis (HCC) Active Problems:   Complicated open wound of hip, left, initial encounter   History of CVA with residual deficit   History of seizure disorder   Anxiety   Bedbound   Principal problem Severe sepsis due to large left hip wound - Presenting with tachycardia, leukocytosis, lactic acidosis.  He has a large left hip infected pressure wound related to his cachexia and chronic bedbound status.  Started on broad-spectrum antibiotics with vancomycin, cefepime and metronidazole, continue -Monitor cultures, wound care consulted although I do suspect orthopedic surgery may need to be involved -Obtain CT of the left hip  Active problems History of CVA with left-sided weakness and contractures, bedbound - chronically ill with poor long-term prognosis.  Per report lives at home with a roommate who reportedly cares for him.   Arrived in urine soaked blanket and ants all over his body. -Obtain palliative care consultation  Acute metabolic encephalopathy -likely in the setting of #1, improving  Chronic hepatitis C-seen by ID as an outpatient, he completed treatment with Mavyret, with no detectable hep C RNA level  Chronic diastolic CHF -most recent 2D echo 2022 showed normal LVEF 65 to 65%, grade 1 diastolic dysfunction.  Currently appears dry  Mild macrocytic anemia -anemia panel.  Also check B1  Elevated isolated bilirubin-unclear significance, AST, ALT, alk phos normal. Monitor.  History of seizures - continue Vimpat.   Anxiety - continue Atarax as needed.  Scheduled Meds:  aspirin  81 mg Oral Daily   atorvastatin  40 mg Oral Daily   heparin  5,000 Units Subcutaneous Q8H   lacosamide  50 mg Oral BID   sodium chloride flush  3 mL Intravenous Q12H   Continuous Infusions:  ceFEPime (MAXIPIME) IV     lactated ringers 75 mL/hr at 11/10/22 0130   metronidazole     [START ON 11/11/2022] vancomycin     PRN Meds:.hydrOXYzine  Current Outpatient Medications  Medication Instructions   aspirin 81 mg, Oral, Daily   atorvastatin (LIPITOR) 40 mg, Oral, Daily   hydrOXYzine (ATARAX) 25 mg, Oral, 2 times daily PRN   lacosamide (VIMPAT) 50 mg, Oral, 2 times daily    Diet Orders (From admission, onward)     Start     Ordered   11/10/22 0045  DIET SOFT Room service appropriate? Yes; Fluid consistency: Thin  Diet effective now       Question Answer Comment  Room service appropriate?  Yes   Fluid consistency: Thin      11/10/22 0049            DVT prophylaxis: heparin injection 5,000 Units Start: 11/10/22 0600   Lab Results  Component Value Date   PLT 257 11/10/2022      Code Status: Full Code  Family Communication: n ofamily at bedside   Status is: Inpatient Remains inpatient appropriate because: severity of illness  Level of care: Progressive  Consultants:  Palliative    Objective: Vitals:   11/10/22 0430 11/10/22 0443 11/10/22 0600 11/10/22 0615  BP: 117/82  114/80   Pulse: (!) 102  (!) 106 99  Resp: 18  (!) 29 18  Temp:  98.7 F (37.1 C) 98.2 F (36.8 C)   TempSrc:  Oral Oral   SpO2: 97%  98% 95%  Weight:      Height:        Intake/Output Summary (Last 24 hours) at 11/10/2022 0630 Last data filed at 11/10/2022 0046 Gross per 24 hour  Intake 2000 ml  Output --  Net 2000 ml   Wt Readings from Last 3 Encounters:  11/10/22 43.7 kg  02/21/22 67.1 kg  02/12/22 67.2 kg    Examination:  Constitutional: cachectic appearing male Eyes: no scleral icterus ENMT: Mucous membranes are dry Neck: normal, supple Respiratory: clear to auscultation bilaterally, no wheezing, no crackles.  Cardiovascular: Regular rate and rhythm, no murmurs / rubs / gallops. No LE edema.  Abdomen: non distended, no tenderness. Bowel sounds positive.  Musculoskeletal: no clubbing / cyanosis.  Skin: no rashes Neurologic: left sided contracted. Doesn't follow commands consistently   Data Reviewed: I have independently reviewed following labs and imaging studies   CBC Recent Labs  Lab 11/09/22 2108 11/10/22 0340  WBC 19.1* 12.3*  HGB 13.8 12.2*  HCT 43.0 37.8*  PLT 376 257  MCV 79.6* 78.9*  MCH 25.6* 25.5*  MCHC 32.1 32.3  RDW 15.0 14.8  LYMPHSABS 1.3  --   MONOABS 1.1*  --   EOSABS 0.1  --   BASOSABS 0.1  --     Recent Labs  Lab 11/09/22 2108 11/09/22 2124 11/10/22 0029 11/10/22 0340  NA 138  --   --  136  K 3.5  --   --  3.8  CL 99  --   --  103  CO2 23  --   --  24  GLUCOSE 123*  --   --  105*  BUN 30*  --   --  25*  CREATININE 1.25*  --   --  0.94  CALCIUM 9.1  --   --  8.5*  AST 18  --   --  15  ALT 11  --   --  11  ALKPHOS 62  --   --  56  BILITOT 1.4*  --   --  1.5*  ALBUMIN 2.8*  --   --  2.4*  PROCALCITON  --   --   --  0.65  LATICACIDVEN  --  6.2* 4.7* 2.7*  INR 1.1  --   --   --      ------------------------------------------------------------------------------------------------------------------ No results for input(s): "CHOL", "HDL", "LDLCALC", "TRIG", "CHOLHDL", "LDLDIRECT" in the last 72 hours.  Lab Results  Component Value Date   HGBA1C 5.3 01/31/2021   ------------------------------------------------------------------------------------------------------------------ No results for input(s): "TSH", "T4TOTAL", "T3FREE", "THYROIDAB" in the last 72 hours.  Invalid input(s): "FREET3"  Cardiac Enzymes No results for input(s): "CKMB", "TROPONINI", "MYOGLOBIN" in the  last 168 hours.  Invalid input(s): "CK" ------------------------------------------------------------------------------------------------------------------ No results found for: "BNP"  CBG: Recent Labs  Lab 11/09/22 2040  GLUCAP 113*    Recent Results (from the past 240 hour(s))  Resp panel by RT-PCR (RSV, Flu A&B, Covid)     Status: None   Collection Time: 11/09/22 10:42 PM   Specimen: Nasal Swab  Result Value Ref Range Status   SARS Coronavirus 2 by RT PCR NEGATIVE NEGATIVE Final   Influenza A by PCR NEGATIVE NEGATIVE Final   Influenza B by PCR NEGATIVE NEGATIVE Final    Comment: (NOTE) The Xpert Xpress SARS-CoV-2/FLU/RSV plus assay is intended as an aid in the diagnosis of influenza from Nasopharyngeal swab specimens and should not be used as a sole basis for treatment. Nasal washings and aspirates are unacceptable for Xpert Xpress SARS-CoV-2/FLU/RSV testing.  Fact Sheet for Patients: BloggerCourse.com  Fact Sheet for Healthcare Providers: SeriousBroker.it  This test is not yet approved or cleared by the [place] FDA and has been authorized for detection and/or diagnosis of SARS-CoV-2 by FDA under an Emergency Use Authorization (EUA). This EUA will remain in effect (meaning this test can be used) for the duration of  the COVID-19 declaration under Section 564(b)(1) of the Act, 21 U.S.C. section 360bbb-3(b)(1), unless the authorization is terminated or revoked.     Resp Syncytial Virus by PCR NEGATIVE NEGATIVE Final    Comment: (NOTE) Fact Sheet for Patients: BloggerCourse.com  Fact Sheet for Healthcare Providers: SeriousBroker.it  This test is not yet approved or cleared by the [place] FDA and has been authorized for detection and/or diagnosis of SARS-CoV-2 by FDA under an Emergency Use Authorization (EUA). This EUA will remain in effect (meaning this test can be used) for the duration of the COVID-19 declaration under Section 564(b)(1) of the Act, 21 U.S.C. section 360bbb-3(b)(1), unless the authorization is terminated or revoked.  Performed at [facility], [address], [place], [place] [address]      Radiology Studies: DG Hip Lucienne Capers or Wo Pelvis 2-3 Views Left  Result Date: 11/09/2022 CLINICAL DATA:  Left hip wound. EXAM: DG HIP (WITH OR WITHOUT PELVIS) 2-3V LEFT COMPARISON:  None Available. FINDINGS: Evaluation is limited due to patient's positioning. No obvious fracture or dislocation. The bones are osteopenic. The soft tissues are grossly unremarkable. IMPRESSION: 1. No obvious fracture or dislocation. 2. Osteopenia. Electronically Signed   By: Elgie Collard M.D.   On: 11/09/2022 23:13   DG Chest Port 1 View  Result Date: 11/09/2022 CLINICAL DATA:  Question of sepsis.  Failure to thrive. EXAM: PORTABLE CHEST 1 VIEW COMPARISON:  09/01/2017 FINDINGS: Patient positioning limits evaluation. Heart size and pulmonary vascularity are normal. Emphysematous changes and scattered fibrosis in the lungs. No focal consolidation or airspace disease is identified. No pleural effusions. No pneumothorax. Mediastinal contours appear intact. IMPRESSION: Emphysematous changes and scattered fibrosis in the lungs. No focal consolidation.  Electronically Signed   By: Burman Nieves M.D.   On: 11/09/2022 21:41     Pamella Pert, MD, PhD [facility]  Between 7 am - 7 pm I am available, please contact me via Amion (for emergencies) or Securechat (non urgent messages)  Between 7 pm - 7 am I am not available, please contact night coverage MD/APP via Amion

## 2022-11-10 NOTE — Hospital Course (Signed)
Douglas Spencer is a 77 y.o. male with medical history significant for history of CVA with residual left-sided weakness, dysarthria, cachectic, bedbound status, history of seizures, anxiety, former substance use who is admitted with severe sepsis due to the large left hip wound infection.

## 2022-11-10 NOTE — H&P (Signed)
History and Physical    Douglas Spencer UJW:[id number] DOB: 1945/04/15 DOA: 11/09/2022  PCP: [name], MD  Patient coming from: Home  I have personally briefly reviewed patient's old medical records in [facility]  Chief Complaint: Change in mental status  HPI: Douglas Spencer is a 77 y.o. male with medical history significant for history of CVA with residual left-sided weakness, dysarthria, cachectic, bedbound status, history of seizures, anxiety, former substance use who presented to the ED from home with altered mental status.   Patient states he lives at home.  He says that he lives with a roommate who supposedly helps care for him.  Patient states he is immobile and bedbound.  Has some mobility in his right arm.  Per ED documentation, his roommate noticed a decrease in mental status x 1 hour.  EMS were called and patient was given 500 mL NS prior to arrival.  On arrival patient was wrapped in a urine saturated blanket which was stuck to his wounds.  He had ants crawling all over his body.  Patient states that he has not been eating much.  He says appetite is poor.  He tells me his main contact is his PCP Dr. Sanda Linger.  ED Course  Labs/Imaging on admission: I have personally reviewed following labs and imaging studies.  Initial vitals showed BP 104/72, pulse 133, RR 18, temp 98.1 F, SpO2 97% on 4 L O2 via [place].  Labs show WBC 19.1, hemoglobin 13.8, platelets 376,000, sodium 138, potassium 3.5, bicarb 23, BUN 30, creatinine 1.25, serum glucose 123, albumin 2.8, ESR 68.  Lactic acid 6.2.  Blood cultures collected and pending.  Respiratory panel collected and pending.  Portable chest x-ray showed emphysematous changes and scattered fibrosis in the lungs.  No focal consolidation.  Left hip x-ray limited due to positioning.  No obvious fracture or dislocation.  Bones are osteopenic.  Soft tissues grossly unremarkable.  Patient was given 2 L LR, IV vancomycin, cefepime,  Flagyl.  The hospitalist service was consulted to admit for further evaluation and management.  Review of Systems: All systems reviewed and are negative except as documented in history of present illness above.   Past Medical History:  Diagnosis Date   ETOH abuse    Substance abuse (HCC)     No past surgical history on file.  Social History:  reports that he has quit smoking. His smoking use included cigarettes. He does not have any smokeless tobacco history on file. He reports that he does not currently use alcohol. He reports that he does not currently use drugs after having used the following drugs: Marijuana and Cocaine.  No Known Allergies  No family history on file.   Prior to Admission medications   Medication Sig Start Date End Date Taking? Authorizing Provider  aspirin 81 MG chewable tablet Chew 1 tablet (81 mg total) by mouth daily. 03/05/21   Jones Bales, NP  atorvastatin (LIPITOR) 40 MG tablet TAKE 1 TABLET BY MOUTH EVERY DAY 10/23/22   [name], MD  hydrOXYzine (ATARAX) 25 MG tablet TAKE 1 TABLET BY MOUTH 2 TIMES DAILY AS NEEDED FOR ANXIETY. 06/14/22   [name], MD  lacosamide (VIMPAT) 50 MG TABS tablet TAKE 1 TABLET BY MOUTH TWICE A DAY 11/07/22   [name], MD    Physical Exam: Vitals:   11/09/22 2315 11/09/22 2330 11/10/22 0015 11/10/22 0048  BP: (!) 136/95 132/89 124/85   Pulse: (!) 112 (!) 113 Marland Kitchen)  103   Resp: 20 20 18    Temp:    98.2 F (36.8 C)  TempSrc:    Oral  SpO2: 94% 92% 97%   Weight:       Exam limited due to mobility Constitutional: Chronically ill-appearing cachectic man resting in bed, awake Eyes: EOMI, lids and conjunctivae normal ENMT: Mucous membranes are dry. Posterior pharynx clear of any exudate or lesions.edentulous Neck: normal, supple, no masses. Respiratory: clear to auscultation anteriorly. Normal respiratory effort. No accessory muscle use.  Cardiovascular: Tachycardic, no murmurs / rubs / gallops. No  extremity edema. 2+ pedal pulses. Abdomen: no tenderness, no masses palpated.  Musculoskeletal: Thin extremities with muscle wasting throughout.  LUE contractured upwards.  Both lower extremities flexed at hips and rotated to the left with minimal movement.  ROM RUE moderately intact. Skin: Large open left hip wound as pictured below Neurologic: Dysarthric, not moving bilateral lower extremities or left upper extremity.  Strength 3/5 RUE. Psychiatric: Alert and oriented to self.  Flat affect.   EKG: Personally reviewed. Sinus tachycardia, rate 127, motion artifact.  Rate is faster when compared to previous.  Assessment/Plan Principal Problem:   Severe sepsis with lactic acidosis (HCC) Active Problems:   Complicated open wound of hip, left, initial encounter   History of CVA with residual deficit   History of seizure disorder   Anxiety   Bedbound   Douglas Spencer is a 77 y.o. male with medical history significant for history of CVA with residual left-sided weakness, dysarthria, cachectic, bedbound status, history of seizures, anxiety, former substance use who is admitted with severe sepsis due to the large left hip wound infection.  Assessment and Plan: Severe sepsis due to large left hip wound: Presenting with tachycardia, leukocytosis, lactic acidosis.  He has a large left hip infected pressure wound related to his cachexia and chronic bedbound status. -Continue IV vancomycin, cefepime, Flagyl -Follow blood cultures -Continue IV fluid hydration overnight -Repeat lactic acid -Consult to wound care  History of CVA with left-sided weakness, LUE contracture, dysarthria Cachectic/bedbound: Chronically ill with poor long-term prognosis.  Per report lives at home with a roommate who reportedly cares for him.  Arrived in urine soaked blanket and ants all over his body. -Continue aspirin, atorvastatin -Fall precautions -Would benefit from palliative care and likely needs  placement  History of seizures: Continue Vimpat.  Anxiety: Continue Atarax as needed.   DVT prophylaxis: heparin injection 5,000 Units Start: 11/10/22 0600 Code Status: Full code, would benefit from further discussions, palliative care Family Communication: None available on admission Disposition Plan: Pending clinical progress Consults called: None Severity of Illness: The appropriate patient status for this patient is INPATIENT. Inpatient status is judged to be reasonable and necessary in order to provide the required intensity of service to ensure the patient's safety. The patient's presenting symptoms, physical exam findings, and initial radiographic and laboratory data in the context of their chronic comorbidities is felt to place them at high risk for further clinical deterioration. Furthermore, it is not anticipated that the patient will be medically stable for discharge from the hospital within 2 midnights of admission.   * I certify that at the point of admission it is my clinical judgment that the patient will require inpatient hospital care spanning beyond 2 midnights from the point of admission due to high intensity of service, high risk for further deterioration and high frequency of surveillance required.Darreld Mclean MD [facility]  If 7PM-7AM, please contact night-coverage [email or web address]  11/10/2022, 12:54  AM

## 2022-11-10 NOTE — ED Notes (Signed)
ED TO INPATIENT HANDOFF REPORT  ED Nurse Name and Phone #: Nehemiah Settle [phone]  S Name/Age/Gender Douglas Spencer 77 y.o. male Room/Bed: 006C/006C  Code Status   Code Status: Full Code  Home/SNF/Other Home Patient oriented to: self Is this baseline? No   Triage Complete: Triage complete  Chief Complaint Severe sepsis with lactic acidosis (HCC) [A41.9, R65.20, E87.20]  Triage Note Patient BIB EMS for evaluation of altered mental status.  Per report, patient lives at home with a caregiver.  Caregiver reports he noticed a decrease in mental status x 1 hour.  Was given 500 mL NS and placed on 2 L [place] with improvement in vital signs.  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  Currently bed bound.  Appears unkept, smells strongly of urine, pressure wound noted to L hip, and dry mucous membranes.  Patient was wrapped in urine saturated comforter on arrival.  Comforter was stuck to wounds.  Pt with ants crawling all over body.     Allergies No Known Allergies  Level of Care/Admitting Diagnosis ED Disposition     ED Disposition  Admit   Condition  --   Comment  Hospital Area: [facility] [[id number]]  Level of Care: Progressive [102]  Admit to Progressive based on following criteria: MULTISYSTEM THREATS such as stable sepsis, metabolic/electrolyte imbalance with or without encephalopathy that is responding to early treatment.  May admit patient to [facility] or [facility] if equivalent level of care is available:: No  Covid Evaluation: Asymptomatic - no recent exposure (last 10 days) testing not required  Diagnosis: Severe sepsis with lactic acidosis [facility]) [[id number]]  Admitting Physician: Charlsie Quest [[id number]]  Attending Physician: Charlsie Quest [[id number]]  Certification:: I certify this patient will need inpatient services for at least 2 midnights  Expected Medical Readiness: 11/18/2022          B Medical/Surgery History Past Medical History:  Diagnosis  Date   ETOH abuse    Substance abuse (HCC)    No past surgical history on file.   A IV Location/Drains/Wounds Patient Lines/Drains/Airways Status     Active Line/Drains/Airways     Name Placement date Placement time Site Days   Peripheral IV 11/09/22 20 G Anterior;Distal;Right;Upper Arm 11/09/22  0000  Arm  1   Peripheral IV 11/09/22 18 G Anterior;Right Forearm 11/09/22  2103  Forearm  1   External Urinary Catheter 11/09/22  2103  --  1            Intake/Output Last 24 hours  Intake/Output Summary (Last 24 hours) at 11/10/2022 0932 Last data filed at 11/10/2022 0046 Gross per 24 hour  Intake 2000 ml  Output --  Net 2000 ml    Labs/Imaging Results for orders placed or performed during the hospital encounter of 11/09/22 (from the past 48 hour(s))  CBG monitoring, ED     Status: Abnormal   Collection Time: 11/09/22  8:40 PM  Result Value Ref Range   Glucose-Capillary 113 (H) 70 - 99 mg/dL    Comment: Glucose reference range applies only to samples taken after fasting for at least 8 hours.   Comment 1 Notify RN    Comment 2 Document in Chart   Comprehensive metabolic panel     Status: Abnormal   Collection Time: 11/09/22  9:08 PM  Result Value Ref Range   Sodium 138 135 - 145 mmol/L   Potassium 3.5 3.5 - 5.1 mmol/L   Chloride 99 98 - 111 mmol/L  CO2 23 22 - 32 mmol/L   Glucose, Bld 123 (H) 70 - 99 mg/dL    Comment: Glucose reference range applies only to samples taken after fasting for at least 8 hours.   BUN 30 (H) 8 - 23 mg/dL   Creatinine, Ser 1.61 (H) 0.61 - 1.24 mg/dL   Calcium 9.1 8.9 - 09.6 mg/dL   Total Protein 7.0 6.5 - 8.1 g/dL   Albumin 2.8 (L) 3.5 - 5.0 g/dL   AST 18 15 - 41 U/L   ALT 11 0 - 44 U/L   Alkaline Phosphatase 62 38 - 126 U/L   Total Bilirubin 1.4 (H) 0.3 - 1.2 mg/dL   GFR, Estimated 59 (L) >60 mL/min    Comment: (NOTE) Calculated using the CKD-EPI Creatinine Equation (2021)    Anion gap 16 (H) 5 - 15    Comment: ELECTROLYTES REPEATED  TO VERIFY Performed at [facility], [address], [place], [place] [address]   CBC with Differential     Status: Abnormal   Collection Time: 11/09/22  9:08 PM  Result Value Ref Range   WBC 19.1 (H) 4.0 - 10.5 K/uL   RBC 5.40 4.22 - 5.81 MIL/uL   Hemoglobin 13.8 13.0 - 17.0 g/dL   HCT 98.1 19.1 - 47.8 %   MCV 79.6 (L) 80.0 - 100.0 fL   MCH 25.6 (L) 26.0 - 34.0 pg   MCHC 32.1 30.0 - 36.0 g/dL   RDW 29.5 62.1 - 30.8 %   Platelets 376 150 - 400 K/uL   nRBC 0.0 0.0 - 0.2 %   Neutrophils Relative % 86 %   Neutro Abs 16.4 (H) 1.7 - 7.7 K/uL   Lymphocytes Relative 7 %   Lymphs Abs 1.3 0.7 - 4.0 K/uL   Monocytes Relative 6 %   Monocytes Absolute 1.1 (H) 0.1 - 1.0 K/uL   Eosinophils Relative 0 %   Eosinophils Absolute 0.1 0.0 - 0.5 K/uL   Basophils Relative 0 %   Basophils Absolute 0.1 0.0 - 0.1 K/uL   Immature Granulocytes 1 %   Abs Immature Granulocytes 0.09 (H) 0.00 - 0.07 K/uL    Comment: Performed at [facility], [address], [place], [place] [address]  Protime-INR     Status: None   Collection Time: 11/09/22  9:08 PM  Result Value Ref Range   Prothrombin Time 14.7 11.4 - 15.2 seconds   INR 1.1 0.8 - 1.2    Comment: (NOTE) INR goal varies based on device and disease states. Performed at [facility], [name] [place], [place] [address]   APTT     Status: None   Collection Time: 11/09/22  9:08 PM  Result Value Ref Range   aPTT 30 24 - 36 seconds    Comment: Performed at [facility], [address], [place], [place] [address]  Sedimentation rate     Status: Abnormal   Collection Time: 11/09/22  9:08 PM  Result Value Ref Range   Sed Rate 68 (H) 0 - 16 mm/hr    Comment: Performed at [facility], [address] [place], [place] [address]  I-Stat Lactic Acid, ED     Status: Abnormal   Collection Time: 11/09/22  9:24 PM  Result Value Ref Range   Lactic Acid, Venous 6.2 (HH) 0.5 - 1.9 mmol/L   Comment NOTIFIED PHYSICIAN   Blood  Culture (routine x 2)     Status: None (  Preliminary result)   Collection Time: 11/09/22 10:01 PM   Specimen: BLOOD  Result Value Ref Range   Specimen Description BLOOD SITE NOT SPECIFIED    Special Requests      BOTTLES DRAWN AEROBIC AND ANAEROBIC Blood Culture adequate volume   Culture      NO GROWTH < 12 HOURS Performed at [facility], [address] [place], [place] [address]    Report Status PENDING   Blood Culture (routine x 2)     Status: None (Preliminary result)   Collection Time: 11/09/22 10:42 PM   Specimen: BLOOD LEFT FOREARM  Result Value Ref Range   Specimen Description BLOOD LEFT FOREARM    Special Requests      BOTTLES DRAWN AEROBIC AND ANAEROBIC Blood Culture adequate volume   Culture      NO GROWTH < 12 HOURS Performed at [facility], [address] [place], [place] [address]    Report Status PENDING   Resp panel by RT-PCR (RSV, Flu A&B, Covid)     Status: None   Collection Time: 11/09/22 10:42 PM   Specimen: Nasal Swab  Result Value Ref Range   SARS Coronavirus 2 by RT PCR NEGATIVE NEGATIVE   Influenza A by PCR NEGATIVE NEGATIVE   Influenza B by PCR NEGATIVE NEGATIVE    Comment: (NOTE) The Xpert Xpress SARS-CoV-2/FLU/RSV plus assay is intended as an aid in the diagnosis of influenza from Nasopharyngeal swab specimens and should not be used as a sole basis for treatment. Nasal washings and aspirates are unacceptable for Xpert Xpress SARS-CoV-2/FLU/RSV testing.  Fact Sheet for Patients: BloggerCourse.com  Fact Sheet for Healthcare Providers: SeriousBroker.it  This test is not yet approved or cleared by the [place] FDA and has been authorized for detection and/or diagnosis of SARS-CoV-2 by FDA under an Emergency Use Authorization (EUA). This EUA will remain in effect (meaning this test can be used) for the duration of the COVID-19 declaration under Section 564(b)(1) of the Act, 21  U.S.C. section 360bbb-3(b)(1), unless the authorization is terminated or revoked.     Resp Syncytial Virus by PCR NEGATIVE NEGATIVE    Comment: (NOTE) Fact Sheet for Patients: BloggerCourse.com  Fact Sheet for Healthcare Providers: SeriousBroker.it  This test is not yet approved or cleared by the [place] FDA and has been authorized for detection and/or diagnosis of SARS-CoV-2 by FDA under an Emergency Use Authorization (EUA). This EUA will remain in effect (meaning this test can be used) for the duration of the COVID-19 declaration under Section 564(b)(1) of the Act, 21 U.S.C. section 360bbb-3(b)(1), unless the authorization is terminated or revoked.  Performed at [facility], [address] [place], [place] [address]   I-Stat Lactic Acid, ED     Status: Abnormal   Collection Time: 11/10/22 12:29 AM  Result Value Ref Range   Lactic Acid, Venous 4.7 (HH) 0.5 - 1.9 mmol/L   Comment NOTIFIED PHYSICIAN   Urinalysis, w/ Reflex to Culture (Infection Suspected) -Urine, Catheterized     Status: Abnormal   Collection Time: 11/10/22  3:30 AM  Result Value Ref Range   Specimen Source URINE, CATHETERIZED    Color, Urine AMBER (A) YELLOW    Comment: BIOCHEMICALS MAY BE AFFECTED BY COLOR   APPearance CLEAR CLEAR   Specific Gravity, Urine 1.027 1.005 - 1.030   pH 5.0 5.0 - 8.0   Glucose, UA NEGATIVE NEGATIVE mg/dL   Hgb urine dipstick NEGATIVE NEGATIVE   Bilirubin Urine NEGATIVE  NEGATIVE   Ketones, ur 5 (A) NEGATIVE mg/dL   Protein, ur 30 (A) NEGATIVE mg/dL   Nitrite NEGATIVE NEGATIVE   Leukocytes,Ua NEGATIVE NEGATIVE   RBC / HPF 0-5 0 - 5 RBC/hpf   WBC, UA 0-5 0 - 5 WBC/hpf    Comment:        Reflex urine culture not performed if WBC <=10, OR if Squamous epithelial cells >5. If Squamous epithelial cells >5 suggest recollection.    Bacteria, UA RARE (A) NONE SEEN   Squamous Epithelial / HPF 0-5 0 - 5 /HPF   Mucus  PRESENT     Comment: Performed at [facility], [address] [place], [place] [address]  Procalcitonin     Status: None   Collection Time: 11/10/22  3:40 AM  Result Value Ref Range   Procalcitonin 0.65 ng/mL    Comment:        Interpretation: PCT > 0.5 ng/mL and <= 2 ng/mL: Systemic infection (sepsis) is possible, but other conditions are known to elevate PCT as well. (NOTE)       Sepsis PCT Algorithm           Lower Respiratory Tract                                      Infection PCT Algorithm    ----------------------------     ----------------------------         PCT < 0.25 ng/mL                PCT < 0.10 ng/mL          Strongly encourage             Strongly discourage   discontinuation of antibiotics    initiation of antibiotics    ----------------------------     -----------------------------       PCT 0.25 - 0.50 ng/mL            PCT 0.10 - 0.25 ng/mL               OR       >80% decrease in PCT            Discourage initiation of                                            antibiotics      Encourage discontinuation           of antibiotics    ----------------------------     -----------------------------         PCT >= 0.50 ng/mL              PCT 0.26 - 0.50 ng/mL                AND       <80% decrease in PCT             Encourage initiation of                                             antibiotics       Encourage continuation  of antibiotics    ----------------------------     -----------------------------        PCT >= 0.50 ng/mL                  PCT > 0.50 ng/mL               AND         increase in PCT                  Strongly encourage                                      initiation of antibiotics    Strongly encourage escalation           of antibiotics                                     -----------------------------                                           PCT <= 0.25 ng/mL                                                 OR                                         > 80% decrease in PCT                                      Discontinue / Do not initiate                                             antibiotics  Performed at [facility], [address] [place], [place] [address]   CBC     Status: Abnormal   Collection Time: 11/10/22  3:40 AM  Result Value Ref Range   WBC 12.3 (H) 4.0 - 10.5 K/uL   RBC 4.79 4.22 - 5.81 MIL/uL   Hemoglobin 12.2 (L) 13.0 - 17.0 g/dL   HCT 19.1 (L) 47.8 - 29.5 %   MCV 78.9 (L) 80.0 - 100.0 fL   MCH 25.5 (L) 26.0 - 34.0 pg   MCHC 32.3 30.0 - 36.0 g/dL   RDW 62.1 30.8 - 65.7 %   Platelets 257 150 - 400 K/uL   nRBC 0.0 0.0 - 0.2 %    Comment: Performed at [facility], [address], [place], [place] [address]  Comprehensive metabolic panel     Status: Abnormal   Collection Time: 11/10/22  3:40 AM  Result Value Ref Range   Sodium 136 135 - 145 mmol/L   Potassium 3.8 3.5 - 5.1 mmol/L   Chloride 103 98 - 111 mmol/L   CO2 24 22 -  32 mmol/L   Glucose, Bld 105 (H) 70 - 99 mg/dL    Comment: Glucose reference range applies only to samples taken after fasting for at least 8 hours.   BUN 25 (H) 8 - 23 mg/dL   Creatinine, Ser 1.61 0.61 - 1.24 mg/dL   Calcium 8.5 (L) 8.9 - 10.3 mg/dL   Total Protein 5.9 (L) 6.5 - 8.1 g/dL   Albumin 2.4 (L) 3.5 - 5.0 g/dL   AST 15 15 - 41 U/L   ALT 11 0 - 44 U/L   Alkaline Phosphatase 56 38 - 126 U/L   Total Bilirubin 1.5 (H) 0.3 - 1.2 mg/dL   GFR, Estimated >09 >60 mL/min    Comment: (NOTE) Calculated using the CKD-EPI Creatinine Equation (2021)    Anion gap 9 5 - 15    Comment: Performed at [facility], [address], [place], [place] [address]  Lactic acid, plasma     Status: Abnormal   Collection Time: 11/10/22  3:40 AM  Result Value Ref Range   Lactic Acid, Venous 2.7 (HH) 0.5 - 1.9 mmol/L    Comment: CRITICAL RESULT CALLED TO, READ BACK BY AND VERIFIED WITH Lucila Maine, RN AT 04:28 09.29.24 JLASIGAN Performed at [facility], [address] [place], [place] [address]   Vitamin B12     Status: None   Collection Time: 11/10/22  6:39 AM  Result Value Ref Range   Vitamin B-12 295 180 - 914 pg/mL    Comment: (NOTE) This assay is not validated for testing neonatal or myeloproliferative syndrome specimens for Vitamin B12 levels. Performed at [facility], [address], [place], [place] [address]   Folate     Status: Abnormal   Collection Time: 11/10/22  6:39 AM  Result Value Ref Range   Folate 4.1 (L) >5.9 ng/mL    Comment: Performed at [facility], [address] [place], [place] [address]  Iron and TIBC     Status: Abnormal   Collection Time: 11/10/22  6:39 AM  Result Value Ref Range   Iron 13 (L) 45 - 182 ug/dL   TIBC 086 (L) 578 - 469 ug/dL   Saturation Ratios 9 (L) 17.9 - 39.5 %   UIBC 133 ug/dL    Comment: Performed at [facility], [address] [place], [place] [address]  Ferritin     Status: Abnormal   Collection Time: 11/10/22  6:39 AM  Result Value Ref Range   Ferritin 514 (H) 24 - 336 ng/mL    Comment: Performed at [facility], [address], [place], [place] [address]   DG Hip Lucienne Capers or [place] Pelvis 2-3 Views Left  Result Date: 11/09/2022 CLINICAL DATA:  Left hip wound. EXAM: DG HIP (WITH OR WITHOUT PELVIS) 2-3V LEFT COMPARISON:  None Available. FINDINGS: Evaluation is limited due to patient's positioning. No obvious fracture or dislocation. The bones are osteopenic. The soft tissues are grossly unremarkable. IMPRESSION: 1. No obvious fracture or dislocation. 2. Osteopenia. Electronically Signed   By: Elgie Collard M.D.   On: 11/09/2022 23:13   DG Chest Port 1 View  Result Date: 11/09/2022 CLINICAL DATA:  Question of sepsis.  Failure to thrive. EXAM: PORTABLE CHEST 1 VIEW COMPARISON:  09/01/2017 FINDINGS: Patient positioning limits evaluation. Heart size and pulmonary vascularity are normal. Emphysematous changes and scattered fibrosis in the lungs. No  focal consolidation or airspace disease is identified. No pleural effusions.  No pneumothorax. Mediastinal contours appear intact. IMPRESSION: Emphysematous changes and scattered fibrosis in the lungs. No focal consolidation. Electronically Signed   By: Burman Nieves M.D.   On: 11/09/2022 21:41    Pending Labs Unresulted Labs (From admission, onward)     Start     Ordered   11/11/22 0500  Comprehensive metabolic panel  Tomorrow morning,   R        11/10/22 0730   11/11/22 0500  CBC  Tomorrow morning,   R        11/10/22 0730   11/11/22 0500  Magnesium  Tomorrow morning,   R        11/10/22 0730   11/10/22 0635  Reticulocytes  (Anemia Panel (PNL))  Once,   R        11/10/22 0634   11/10/22 0635  Vitamin B1  Once,   R        11/10/22 0634            Vitals/Pain Today's Vitals   11/10/22 0630 11/10/22 0645 11/10/22 0800 11/10/22 0900  BP: 105/83  91/67 108/63  Pulse: 96 (!) 109 (!) 101 83  Resp: 16 17 16 14   Temp:      TempSrc:      SpO2: 99% 99% 98% 100%  Weight:      Height:        Isolation Precautions No active isolations  Medications Medications  heparin injection 5,000 Units (5,000 Units Subcutaneous Given 11/10/22 0504)  sodium chloride flush (NS) 0.9 % injection 3 mL (3 mLs Intravenous Given 11/10/22 0900)  lactated ringers infusion ( Intravenous New Bag/Given 11/10/22 0130)  metroNIDAZOLE (FLAGYL) IVPB 500 mg (has no administration in time range)  aspirin chewable tablet 81 mg (0 mg Oral Hold 11/10/22 0920)  atorvastatin (LIPITOR) tablet 40 mg (0 mg Oral Hold 11/10/22 0920)  hydrOXYzine (ATARAX) tablet 25 mg (has no administration in time range)  ceFEPIme (MAXIPIME) 2 g in sodium chloride 0.9 % 100 mL IVPB (2 g Intravenous New Bag/Given 11/10/22 0905)  vancomycin (VANCOREADY) IVPB 750 mg/150 mL (has no administration in time range)  lacosamide (VIMPAT) 50 mg in sodium chloride 0.9 % 25 mL IVPB (has no administration in time range)  lactated ringers bolus 1,000 mL  (0 mLs Intravenous Stopped 11/09/22 2314)    And  lactated ringers bolus 1,000 mL (0 mLs Intravenous Stopped 11/10/22 0046)  ceFEPIme (MAXIPIME) 2 g in sodium chloride 0.9 % 100 mL IVPB (0 g Intravenous Stopped 11/09/22 2317)  metroNIDAZOLE (FLAGYL) IVPB 500 mg (0 mg Intravenous Stopped 11/10/22 0019)  vancomycin (VANCOCIN) IVPB 1000 mg/200 mL premix (0 mg Intravenous Stopped 11/10/22 0128)  iohexol (OMNIPAQUE) 350 MG/ML injection 75 mL (75 mLs Intravenous Contrast Given 11/10/22 0847)    Mobility non-ambulatory     Focused Assessments    R Recommendations: See Admitting Provider Note  Report given to:   Additional Notes:

## 2022-11-10 NOTE — Progress Notes (Signed)
     Referral received for Douglas Spencer :goals of care discussion. Chart reviewed and updates received from RN. Patient assessed and is unable to engage appropriately in discussions. Attempted to contact patient's friend/roommate Douglas Spencer. Unable to reach. Voicemail left with contact information given.   PMT will re-attempt to contact family at a later time/date. Detailed note and recommendations to follow once GOC has been completed.   Thank you for your referral and allowing PMT to assist in Mr. Douglas Spencer's care.   Richardson Dopp, [facility] Palliative Medicine Team  Team Phone # [phone]   NO CHARGE

## 2022-11-10 NOTE — Progress Notes (Signed)
Pharmacy Antibiotic Note  Douglas Spencer is a 77 y.o. male admitted on 11/09/2022 with altered mental status and large hip wound with sepsis concerns.  Pharmacy has been consulted for cefepime and vancomycin dosing.   WBC 19, sCr 1.25, lactate 6.2 >> 4.7, afebrile  Plan: -Cefepime 2g IV every 12 hours -Vancomycin 1000mg  IV x1 -Vancomycin 750mg  IV every 48 hours (AUC 405, Vd 0.72, TBW) Flagyl 500mg  IV every 12 hours per MD -Monitor renal function -Follow up signs of clinical improvement, LOT, de-escalation of antibiotics  Weight: 43.7 kg (96 lb 6.4 oz)  Temp (24hrs), Avg:98.2 F (36.8 C), Min:98.1 F (36.7 C), Max:98.2 F (36.8 C)  Recent Labs  Lab 11/09/22 2108 11/09/22 2124 11/10/22 0029  WBC 19.1*  --   --   CREATININE 1.25*  --   --   LATICACIDVEN  --  6.2* 4.7*    CrCl cannot be calculated (Unknown ideal weight.).    No Known Allergies  Antimicrobials this admission: Cefepime 9/28 >>  Vancomycin 9/28 >>  Flagyl 9/28 >>  Microbiology results: 9/28 BCx:     Thank you for allowing pharmacy to be a part of this patient's care.  Arabella Merles, PharmD. Clinical Pharmacist 11/10/2022 1:11 AM

## 2022-11-11 ENCOUNTER — Inpatient Hospital Stay: Payer: Self-pay

## 2022-11-11 ENCOUNTER — Inpatient Hospital Stay (HOSPITAL_COMMUNITY): Payer: Medicare HMO

## 2022-11-11 DIAGNOSIS — Z66 Do not resuscitate: Secondary | ICD-10-CM

## 2022-11-11 DIAGNOSIS — A419 Sepsis, unspecified organism: Secondary | ICD-10-CM | POA: Diagnosis not present

## 2022-11-11 DIAGNOSIS — R6520 Severe sepsis without septic shock: Secondary | ICD-10-CM

## 2022-11-11 DIAGNOSIS — Z789 Other specified health status: Secondary | ICD-10-CM

## 2022-11-11 DIAGNOSIS — Z515 Encounter for palliative care: Secondary | ICD-10-CM | POA: Diagnosis not present

## 2022-11-11 DIAGNOSIS — R4182 Altered mental status, unspecified: Secondary | ICD-10-CM

## 2022-11-11 DIAGNOSIS — E872 Acidosis, unspecified: Secondary | ICD-10-CM

## 2022-11-11 DIAGNOSIS — R569 Unspecified convulsions: Secondary | ICD-10-CM | POA: Diagnosis not present

## 2022-11-11 DIAGNOSIS — Z7189 Other specified counseling: Secondary | ICD-10-CM | POA: Diagnosis not present

## 2022-11-11 LAB — CBC
HCT: 30 % — ABNORMAL LOW (ref 39.0–52.0)
Hemoglobin: 9.8 g/dL — ABNORMAL LOW (ref 13.0–17.0)
MCH: 26.1 pg (ref 26.0–34.0)
MCHC: 32.7 g/dL (ref 30.0–36.0)
MCV: 79.8 fL — ABNORMAL LOW (ref 80.0–100.0)
Platelets: 183 10*3/uL (ref 150–400)
RBC: 3.76 MIL/uL — ABNORMAL LOW (ref 4.22–5.81)
RDW: 14.9 % (ref 11.5–15.5)
WBC: 7.5 10*3/uL (ref 4.0–10.5)
nRBC: 0 % (ref 0.0–0.2)

## 2022-11-11 LAB — COMPREHENSIVE METABOLIC PANEL
ALT: 9 U/L (ref 0–44)
AST: 14 U/L — ABNORMAL LOW (ref 15–41)
Albumin: 2 g/dL — ABNORMAL LOW (ref 3.5–5.0)
Alkaline Phosphatase: 47 U/L (ref 38–126)
Anion gap: 11 (ref 5–15)
BUN: 18 mg/dL (ref 8–23)
CO2: 20 mmol/L — ABNORMAL LOW (ref 22–32)
Calcium: 8 mg/dL — ABNORMAL LOW (ref 8.9–10.3)
Chloride: 107 mmol/L (ref 98–111)
Creatinine, Ser: 0.63 mg/dL (ref 0.61–1.24)
GFR, Estimated: >60 mL/min (ref >60)
Glucose, Bld: 96 mg/dL (ref 70–99)
Potassium: 2.8 mmol/L — ABNORMAL LOW (ref 3.5–5.1)
Sodium: 138 mmol/L (ref 135–145)
Total Bilirubin: 0.7 mg/dL (ref 0.3–1.2)
Total Protein: 5.1 g/dL — ABNORMAL LOW (ref 6.5–8.1)

## 2022-11-11 LAB — GLUCOSE, CAPILLARY
Glucose-Capillary: 81 mg/dL (ref 70–99)
Glucose-Capillary: 86 mg/dL (ref 70–99)
Glucose-Capillary: 92 mg/dL (ref 70–99)
Glucose-Capillary: 93 mg/dL (ref 70–99)

## 2022-11-11 LAB — AMMONIA: Ammonia: 18 umol/L (ref 9–35)

## 2022-11-11 LAB — C-REACTIVE PROTEIN: CRP: 14.2 mg/dL — ABNORMAL HIGH (ref <1.0)

## 2022-11-11 LAB — FOLATE: Folate: 36.8 ng/mL (ref >5.9)

## 2022-11-11 LAB — PROCALCITONIN: Procalcitonin: 0.56 ng/mL

## 2022-11-11 LAB — MAGNESIUM: Magnesium: 1.8 mg/dL (ref 1.7–2.4)

## 2022-11-11 LAB — TSH: TSH: 1.48 u[IU]/mL (ref 0.350–4.500)

## 2022-11-11 MED ORDER — JUVEN PO PACK
1.0000 | PACK | Freq: Two times a day (BID) | ORAL | Status: DC
  Administered 2022-11-12 (×2): 1 via ORAL
  Filled 2022-11-11 (×2): qty 1

## 2022-11-11 MED ORDER — CYANOCOBALAMIN 1000 MCG/ML IJ SOLN
1000.0000 ug | Freq: Every day | INTRAMUSCULAR | Status: DC
  Administered 2022-11-11 – 2022-11-12 (×2): 1000 ug via SUBCUTANEOUS
  Filled 2022-11-11 (×2): qty 1

## 2022-11-11 MED ORDER — DEXTROSE IN LACTATED RINGERS 5 % IV SOLN: INTRAVENOUS | Status: DC

## 2022-11-11 MED ORDER — MAGNESIUM SULFATE IN D5W 1-5 GM/100ML-% IV SOLN
1.0000 g | Freq: Once | INTRAVENOUS | Status: AC
  Administered 2022-11-11: 1 g via INTRAVENOUS
  Filled 2022-11-11: qty 100

## 2022-11-11 MED ORDER — CHLORHEXIDINE GLUCONATE CLOTH 2 % EX PADS
6.0000 | MEDICATED_PAD | Freq: Every day | CUTANEOUS | Status: DC
  Administered 2022-11-11 – 2022-11-12 (×2): 6 via TOPICAL

## 2022-11-11 MED ORDER — DAKINS (1/4 STRENGTH) 0.125 % EX SOLN
Freq: Two times a day (BID) | CUTANEOUS | Status: DC
  Filled 2022-11-11: qty 473

## 2022-11-11 MED ORDER — VITAMIN B-12 1000 MCG PO TABS: 1000.0000 ug | ORAL_TABLET | Freq: Every day | ORAL | Status: DC

## 2022-11-11 MED ORDER — POTASSIUM CHLORIDE 10 MEQ/100ML IV SOLN
10.0000 meq | INTRAVENOUS | Status: AC
  Administered 2022-11-11 (×6): 10 meq via INTRAVENOUS
  Filled 2022-11-11 (×6): qty 100

## 2022-11-11 MED ORDER — SODIUM CHLORIDE 0.9% FLUSH: 10.0000 mL | INTRAVENOUS | Status: DC | PRN

## 2022-11-11 NOTE — Consult Note (Addendum)
Reason for Consult:Left hip decub Referring Physician: Susa Raring Time called: 9147 Time at bedside: 1023   Douglas Spencer is an 77 y.o. male.  HPI: Taheem was admitted from home yesterday with decreased mental status. He was found covered in urine and ants. He was noted to have a large left hip decub and orthopedic surgery was consulted the following day. He is able to answer simple questions but there has been some comments by practitioners that he may not be reliable. He denies pain.  Past Medical History:  Diagnosis Date   ETOH abuse    Substance abuse (HCC)     No past surgical history on file.  No family history on file.  Social History:  reports that he has quit smoking. His smoking use included cigarettes. He does not have any smokeless tobacco history on file. He reports that he does not currently use alcohol. He reports that he does not currently use drugs after having used the following drugs: Marijuana and Cocaine.  Allergies: No Known Allergies  Medications: I have reviewed the patient's current medications.  Results for orders placed or performed during the hospital encounter of 11/09/22 (from the past 48 hour(s))  CBG monitoring, ED     Status: Abnormal   Collection Time: 11/09/22  8:40 PM  Result Value Ref Range   Glucose-Capillary 113 (H) 70 - 99 mg/dL    Comment: Glucose reference range applies only to samples taken after fasting for at least 8 hours.   Comment 1 Notify RN    Comment 2 Document in Chart   Comprehensive metabolic panel     Status: Abnormal   Collection Time: 11/09/22  9:08 PM  Result Value Ref Range   Sodium 138 135 - 145 mmol/L   Potassium 3.5 3.5 - 5.1 mmol/L   Chloride 99 98 - 111 mmol/L   CO2 23 22 - 32 mmol/L   Glucose, Bld 123 (H) 70 - 99 mg/dL    Comment: Glucose reference range applies only to samples taken after fasting for at least 8 hours.   BUN 30 (H) 8 - 23 mg/dL   Creatinine, Ser 8.29 (H) 0.61 - 1.24 mg/dL   Calcium 9.1  8.9 - 56.2 mg/dL   Total Protein 7.0 6.5 - 8.1 g/dL   Albumin 2.8 (L) 3.5 - 5.0 g/dL   AST 18 15 - 41 U/L   ALT 11 0 - 44 U/L   Alkaline Phosphatase 62 38 - 126 U/L   Total Bilirubin 1.4 (H) 0.3 - 1.2 mg/dL   GFR, Estimated 59 (L) >60 mL/min    Comment: (NOTE) Calculated using the CKD-EPI Creatinine Equation (2021)    Anion gap 16 (H) 5 - 15    Comment: ELECTROLYTES REPEATED TO VERIFY Performed at [facility], [address] [place], [place] [address]   CBC with Differential     Status: Abnormal   Collection Time: 11/09/22  9:08 PM  Result Value Ref Range   WBC 19.1 (H) 4.0 - 10.5 K/uL   RBC 5.40 4.22 - 5.81 MIL/uL   Hemoglobin 13.8 13.0 - 17.0 g/dL   HCT 57.8 46.9 - 62.9 %   MCV 79.6 (L) 80.0 - 100.0 fL   MCH 25.6 (L) 26.0 - 34.0 pg   MCHC 32.1 30.0 - 36.0 g/dL   RDW 52.8 41.3 - 24.4 %   Platelets 376 150 - 400 K/uL   nRBC 0.0 0.0 - 0.2 %   Neutrophils Relative % 86 %  Neutro Abs 16.4 (H) 1.7 - 7.7 K/uL   Lymphocytes Relative 7 %   Lymphs Abs 1.3 0.7 - 4.0 K/uL   Monocytes Relative 6 %   Monocytes Absolute 1.1 (H) 0.1 - 1.0 K/uL   Eosinophils Relative 0 %   Eosinophils Absolute 0.1 0.0 - 0.5 K/uL   Basophils Relative 0 %   Basophils Absolute 0.1 0.0 - 0.1 K/uL   Immature Granulocytes 1 %   Abs Immature Granulocytes 0.09 (H) 0.00 - 0.07 K/uL    Comment: Performed at [facility], [address], [place], [place] [address]  Protime-INR     Status: None   Collection Time: 11/09/22  9:08 PM  Result Value Ref Range   Prothrombin Time 14.7 11.4 - 15.2 seconds   INR 1.1 0.8 - 1.2    Comment: (NOTE) INR goal varies based on device and disease states. Performed at [facility], [address], [place], [place] [address]   APTT     Status: None   Collection Time: 11/09/22  9:08 PM  Result Value Ref Range   aPTT 30 24 - 36 seconds    Comment: Performed at [facility], [address] [place], [place] [address]  Sedimentation rate     Status:  Abnormal   Collection Time: 11/09/22  9:08 PM  Result Value Ref Range   Sed Rate 68 (H) 0 - 16 mm/hr    Comment: Performed at [facility], [address], [place], [place] [address]  I-Stat Lactic Acid, ED     Status: Abnormal   Collection Time: 11/09/22  9:24 PM  Result Value Ref Range   Lactic Acid, Venous 6.2 (HH) 0.5 - 1.9 mmol/L   Comment NOTIFIED PHYSICIAN   Blood Culture (routine x 2)     Status: None (Preliminary result)   Collection Time: 11/09/22 10:01 PM   Specimen: BLOOD  Result Value Ref Range   Specimen Description BLOOD SITE NOT SPECIFIED    Special Requests      BOTTLES DRAWN AEROBIC AND ANAEROBIC Blood Culture adequate volume   Culture      NO GROWTH 2 DAYS Performed at [facility], [address] [place], [place] [address]    Report Status PENDING   Blood Culture (routine x 2)     Status: None (Preliminary result)   Collection Time: 11/09/22 10:42 PM   Specimen: BLOOD LEFT FOREARM  Result Value Ref Range   Specimen Description BLOOD LEFT FOREARM    Special Requests      BOTTLES DRAWN AEROBIC AND ANAEROBIC Blood Culture adequate volume   Culture      NO GROWTH 1 DAY Performed at [facility], [address], [place], [place] [address]    Report Status PENDING   Resp panel by RT-PCR (RSV, Flu A&B, Covid)     Status: None   Collection Time: 11/09/22 10:42 PM   Specimen: Nasal Swab  Result Value Ref Range   SARS Coronavirus 2 by RT PCR NEGATIVE NEGATIVE   Influenza A by PCR NEGATIVE NEGATIVE   Influenza B by PCR NEGATIVE NEGATIVE    Comment: (NOTE) The Xpert Xpress SARS-CoV-2/FLU/RSV plus assay is intended as an aid in the diagnosis of influenza from Nasopharyngeal swab specimens and should not be used as a sole basis for treatment. Nasal washings and aspirates are unacceptable for Xpert Xpress SARS-CoV-2/FLU/RSV testing.  Fact Sheet for Patients: BloggerCourse.com  Fact Sheet for Healthcare  Providers: SeriousBroker.it  This test is not yet approved or cleared by the [place] and has been authorized for detection and/or diagnosis of SARS-CoV-2 by FDA under an Emergency Use Authorization (EUA). This EUA will remain in effect (meaning this test can be used) for the duration of the COVID-19 declaration under Section 564(b)(1) of the Act, 21 U.S.C. section 360bbb-3(b)(1), unless the authorization is terminated or revoked.     Resp Syncytial Virus by PCR NEGATIVE NEGATIVE    Comment: (NOTE) Fact Sheet for Patients: BloggerCourse.com  Fact Sheet for Healthcare Providers: SeriousBroker.it  This test is not yet approved or cleared by the [place] FDA and has been authorized for detection and/or diagnosis of SARS-CoV-2 by FDA under an Emergency Use Authorization (EUA). This EUA will remain in effect (meaning this test can be used) for the duration of the COVID-19 declaration under Section 564(b)(1) of the Act, 21 U.S.C. section 360bbb-3(b)(1), unless the authorization is terminated or revoked.  Performed at [facility], [address], [place], [place] [address]   I-Stat Lactic Acid, ED     Status: Abnormal   Collection Time: 11/10/22 12:29 AM  Result Value Ref Range   Lactic Acid, Venous 4.7 (HH) 0.5 - 1.9 mmol/L   Comment NOTIFIED PHYSICIAN   Urinalysis, w/ Reflex to Culture (Infection Suspected) -Urine, Catheterized     Status: Abnormal   Collection Time: 11/10/22  3:30 AM  Result Value Ref Range   Specimen Source URINE, CATHETERIZED    Color, Urine AMBER (A) YELLOW    Comment: BIOCHEMICALS MAY BE AFFECTED BY COLOR   APPearance CLEAR CLEAR   Specific Gravity, Urine 1.027 1.005 - 1.030   pH 5.0 5.0 - 8.0   Glucose, UA NEGATIVE NEGATIVE mg/dL   Hgb urine dipstick NEGATIVE NEGATIVE   Bilirubin Urine NEGATIVE NEGATIVE   Ketones, ur 5 (A) NEGATIVE mg/dL   Protein, ur 30  (A) NEGATIVE mg/dL   Nitrite NEGATIVE NEGATIVE   Leukocytes,Ua NEGATIVE NEGATIVE   RBC / HPF 0-5 0 - 5 RBC/hpf   WBC, UA 0-5 0 - 5 WBC/hpf    Comment:        Reflex urine culture not performed if WBC <=10, OR if Squamous epithelial cells >5. If Squamous epithelial cells >5 suggest recollection.    Bacteria, UA RARE (A) NONE SEEN   Squamous Epithelial / HPF 0-5 0 - 5 /HPF   Mucus PRESENT     Comment: Performed at [facility], [address], [place], [place] [address]  Procalcitonin     Status: None   Collection Time: 11/10/22  3:40 AM  Result Value Ref Range   Procalcitonin 0.65 ng/mL    Comment:        Interpretation: PCT > 0.5 ng/mL and <= 2 ng/mL: Systemic infection (sepsis) is possible, but other conditions are known to elevate PCT as well. (NOTE)       Sepsis PCT Algorithm           Lower Respiratory Tract                                      Infection PCT Algorithm    ----------------------------     ----------------------------         PCT < 0.25 ng/mL                PCT < 0.10 ng/mL  Strongly encourage             Strongly discourage   discontinuation of antibiotics    initiation of antibiotics    ----------------------------     -----------------------------       PCT 0.25 - 0.50 ng/mL            PCT 0.10 - 0.25 ng/mL               OR       >80% decrease in PCT            Discourage initiation of                                            antibiotics      Encourage discontinuation           of antibiotics    ----------------------------     -----------------------------         PCT >= 0.50 ng/mL              PCT 0.26 - 0.50 ng/mL                AND       <80% decrease in PCT             Encourage initiation of                                             antibiotics       Encourage continuation           of antibiotics    ----------------------------     -----------------------------        PCT >= 0.50 ng/mL                  PCT > 0.50 ng/mL                AND         increase in PCT                  Strongly encourage                                      initiation of antibiotics    Strongly encourage escalation           of antibiotics                                     -----------------------------                                           PCT <= 0.25 ng/mL                                                 OR                                        >  80% decrease in PCT                                      Discontinue / Do not initiate                                             antibiotics  Performed at [facility], [address], [place], [place] [address]   CBC     Status: Abnormal   Collection Time: 11/10/22  3:40 AM  Result Value Ref Range   WBC 12.3 (H) 4.0 - 10.5 K/uL   RBC 4.79 4.22 - 5.81 MIL/uL   Hemoglobin 12.2 (L) 13.0 - 17.0 g/dL   HCT 86.5 (L) 78.4 - 69.6 %   MCV 78.9 (L) 80.0 - 100.0 fL   MCH 25.5 (L) 26.0 - 34.0 pg   MCHC 32.3 30.0 - 36.0 g/dL   RDW 29.5 28.4 - 13.2 %   Platelets 257 150 - 400 K/uL   nRBC 0.0 0.0 - 0.2 %    Comment: Performed at [facility], [address], [place], [place] [address]  Comprehensive metabolic panel     Status: Abnormal   Collection Time: 11/10/22  3:40 AM  Result Value Ref Range   Sodium 136 135 - 145 mmol/L   Potassium 3.8 3.5 - 5.1 mmol/L   Chloride 103 98 - 111 mmol/L   CO2 24 22 - 32 mmol/L   Glucose, Bld 105 (H) 70 - 99 mg/dL    Comment: Glucose reference range applies only to samples taken after fasting for at least 8 hours.   BUN 25 (H) 8 - 23 mg/dL   Creatinine, Ser 2.72 0.61 - 1.24 mg/dL   Calcium 8.5 (L) 8.9 - 10.3 mg/dL   Total Protein 5.9 (L) 6.5 - 8.1 g/dL   Albumin 2.4 (L) 3.5 - 5.0 g/dL   AST 15 15 - 41 U/L   ALT 11 0 - 44 U/L   Alkaline Phosphatase 56 38 - 126 U/L   Total Bilirubin 1.5 (H) 0.3 - 1.2 mg/dL   GFR, Estimated >53 >66 mL/min    Comment: (NOTE) Calculated using the CKD-EPI Creatinine Equation (2021)    Anion gap 9 5  - 15    Comment: Performed at [facility], [address], [place], [place] [address]  Lactic acid, plasma     Status: Abnormal   Collection Time: 11/10/22  3:40 AM  Result Value Ref Range   Lactic Acid, Venous 2.7 (HH) 0.5 - 1.9 mmol/L    Comment: CRITICAL RESULT CALLED TO, READ BACK BY AND VERIFIED WITH Lucila Maine, RN AT 04:28 09.29.24 JLASIGAN Performed at [facility], [address], [place], [place] [address]   Vitamin B12     Status: None   Collection Time: 11/10/22  6:39 AM  Result Value Ref Range   Vitamin B-12 295 180 - 914 pg/mL    Comment: (NOTE) This assay is not validated for testing neonatal or myeloproliferative syndrome specimens for Vitamin B12 levels. Performed at [facility], [address] [place], [place] [address]   Folate     Status: Abnormal   Collection Time: 11/10/22  6:39 AM  Result Value Ref Range   Folate 4.1 (  L) >5.9 ng/mL    Comment: Performed at [facility], [address] [place], [place] [address]  Iron and TIBC     Status: Abnormal   Collection Time: 11/10/22  6:39 AM  Result Value Ref Range   Iron 13 (L) 45 - 182 ug/dL   TIBC 564 (L) 332 - 951 ug/dL   Saturation Ratios 9 (L) 17.9 - 39.5 %   UIBC 133 ug/dL    Comment: Performed at [facility], [address] [place], [place] [address]  Ferritin     Status: Abnormal   Collection Time: 11/10/22  6:39 AM  Result Value Ref Range   Ferritin 514 (H) 24 - 336 ng/mL    Comment: Performed at [facility], [address] [place], [place] [address]  Glucose, capillary     Status: Abnormal   Collection Time: 11/10/22  3:53 PM  Result Value Ref Range   Glucose-Capillary 100 (H) 70 - 99 mg/dL    Comment: Glucose reference range applies only to samples taken after fasting for at least 8 hours.  Glucose, capillary     Status: None   Collection Time: 11/11/22 12:33 AM  Result Value Ref Range   Glucose-Capillary 93 70 - 99 mg/dL    Comment: Glucose reference  range applies only to samples taken after fasting for at least 8 hours.  Comprehensive metabolic panel     Status: Abnormal   Collection Time: 11/11/22  4:19 AM  Result Value Ref Range   Sodium 138 135 - 145 mmol/L   Potassium 2.8 (L) 3.5 - 5.1 mmol/L   Chloride 107 98 - 111 mmol/L   CO2 20 (L) 22 - 32 mmol/L   Glucose, Bld 96 70 - 99 mg/dL    Comment: Glucose reference range applies only to samples taken after fasting for at least 8 hours.   BUN 18 8 - 23 mg/dL   Creatinine, Ser 1.60 0.61 - 1.24 mg/dL   Calcium 8.0 (L) 8.9 - 10.3 mg/dL   Total Protein 5.1 (L) 6.5 - 8.1 g/dL   Albumin 2.0 (L) 3.5 - 5.0 g/dL   AST 14 (L) 15 - 41 U/L   ALT 9 0 - 44 U/L   Alkaline Phosphatase 47 38 - 126 U/L   Total Bilirubin 0.7 0.3 - 1.2 mg/dL   GFR, Estimated >10 >93 mL/min    Comment: (NOTE) Calculated using the CKD-EPI Creatinine Equation (2021)    Anion gap 11 5 - 15    Comment: Performed at [facility], [address], [place], [place] [address]  CBC     Status: Abnormal   Collection Time: 11/11/22  4:19 AM  Result Value Ref Range   WBC 7.5 4.0 - 10.5 K/uL   RBC 3.76 (L) 4.22 - 5.81 MIL/uL   Hemoglobin 9.8 (L) 13.0 - 17.0 g/dL   HCT 32.2 (L) 02.5 - 42.7 %   MCV 79.8 (L) 80.0 - 100.0 fL   MCH 26.1 26.0 - 34.0 pg   MCHC 32.7 30.0 - 36.0 g/dL   RDW 06.2 37.6 - 28.3 %   Platelets 183 150 - 400 K/uL   nRBC 0.0 0.0 - 0.2 %    Comment: Performed at [facility], [address], [place], [place] [address]  Magnesium     Status: None   Collection Time: 11/11/22  4:19 AM  Result Value Ref Range   Magnesium 1.8 1.7 - 2.4 mg/dL  Comment: Performed at [facility], [address] [place], [place] [address]  C-reactive protein     Status: Abnormal   Collection Time: 11/11/22  4:19 AM  Result Value Ref Range   CRP 14.2 (H) <1.0 mg/dL    Comment: Performed at [facility], [address], [place], [place] [address]  Procalcitonin     Status: None   Collection Time:  11/11/22  4:19 AM  Result Value Ref Range   Procalcitonin 0.56 ng/mL    Comment:        Interpretation: PCT > 0.5 ng/mL and <= 2 ng/mL: Systemic infection (sepsis) is possible, but other conditions are known to elevate PCT as well. (NOTE)       Sepsis PCT Algorithm           Lower Respiratory Tract                                      Infection PCT Algorithm    ----------------------------     ----------------------------         PCT < 0.25 ng/mL                PCT < 0.10 ng/mL          Strongly encourage             Strongly discourage   discontinuation of antibiotics    initiation of antibiotics    ----------------------------     -----------------------------       PCT 0.25 - 0.50 ng/mL            PCT 0.10 - 0.25 ng/mL               OR       >80% decrease in PCT            Discourage initiation of                                            antibiotics      Encourage discontinuation           of antibiotics    ----------------------------     -----------------------------         PCT >= 0.50 ng/mL              PCT 0.26 - 0.50 ng/mL                AND       <80% decrease in PCT             Encourage initiation of                                             antibiotics       Encourage continuation           of antibiotics    ----------------------------     -----------------------------        PCT >= 0.50 ng/mL                  PCT > 0.50 ng/mL               AND  increase in PCT                  Strongly encourage                                      initiation of antibiotics    Strongly encourage escalation           of antibiotics                                     -----------------------------                                           PCT <= 0.25 ng/mL                                                 OR                                        > 80% decrease in PCT                                      Discontinue / Do not initiate                                              antibiotics  Performed at [facility], [address] [place], [place] [address]   Glucose, capillary     Status: None   Collection Time: 11/11/22  8:34 AM  Result Value Ref Range   Glucose-Capillary 86 70 - 99 mg/dL    Comment: Glucose reference range applies only to samples taken after fasting for at least 8 hours.    DG Chest Port 1 View  Result Date: 11/11/2022 CLINICAL DATA:  Shortness of breath EXAM: PORTABLE CHEST 1 VIEW COMPARISON:  CXR 09/01/17 FINDINGS: No pleural effusion. No pneumothorax. Normal cardiac and mediastinal contours. There are multilevel opacity airspace opacities in the left lung, suspicious for infection. No radiographically apparent displaced rib fractures. Visualized upper abdomen is unremarkable. IMPRESSION: Multifocal airspace opacities in the left lung are suspicious for infection. Electronically Signed   By: Lorenza Cambridge M.D.   On: 11/11/2022 08:14   CT HIP LEFT W CONTRAST  Result Date: 11/10/2022 CLINICAL DATA:  Sepsis with leukocytosis and multiple wounds. History of CVA with left-sided weakness. Patient is bed bound. Concern for osteomyelitis. EXAM: CT OF THE LOWER LEFT EXTREMITY WITH CONTRAST TECHNIQUE: Multidetector CT imaging of the left hip was performed according to the standard protocol following intravenous contrast administration. RADIATION DOSE REDUCTION: This exam was performed according to the departmental dose-optimization program which includes automated exposure control, adjustment of the mA and/or kV according to patient size and/or use of iterative reconstruction technique. CONTRAST:  75mL OMNIPAQUE IOHEXOL 350 MG/ML SOLN COMPARISON:  Radiographs 11/09/2022.  None additional. FINDINGS: Bones/Joint/Cartilage The patient's left hip is flexed approximately 90 degrees. There is no evidence of acute fracture, dislocation or femoral head osteonecrosis. The bones are demineralized. Mild degenerative changes at both hips without evidence  of large hip joint effusion. No cortical destruction identified to suggest osteomyelitis. Ligaments Suboptimally assessed by CT. Muscles and Tendons Mild generalized muscular atrophy. No focal intramuscular fluid collection or abnormal enhancement identified. Soft tissues Possible mild decubitus ulceration over the left greater trochanter and left ischium without evidence of underlying focal fluid collection, foreign body or soft tissue emphysema. No significant underlying subcutaneous inflammatory changes are identified. There is a moderate amount of stool within the rectum. Mild iliofemoral atherosclerosis noted. IMPRESSION: 1. Possible mild decubitus ulceration over the left greater trochanter and left ischium without evidence of underlying focal fluid collection, foreign body or soft tissue emphysema. 2. No CT evidence of osteomyelitis or septic arthritis. 3. No acute osseous findings. Electronically Signed   By: Carey Bullocks M.D.   On: 11/10/2022 09:32   DG Hip Unilat W or Wo Pelvis 2-3 Views Left  Result Date: 11/09/2022 CLINICAL DATA:  Left hip wound. EXAM: DG HIP (WITH OR WITHOUT PELVIS) 2-3V LEFT COMPARISON:  None Available. FINDINGS: Evaluation is limited due to patient's positioning. No obvious fracture or dislocation. The bones are osteopenic. The soft tissues are grossly unremarkable. IMPRESSION: 1. No obvious fracture or dislocation. 2. Osteopenia. Electronically Signed   By: Elgie Collard M.D.   On: 11/09/2022 23:13   DG Chest Port 1 View  Result Date: 11/09/2022 CLINICAL DATA:  Question of sepsis.  Failure to thrive. EXAM: PORTABLE CHEST 1 VIEW COMPARISON:  09/01/2017 FINDINGS: Patient positioning limits evaluation. Heart size and pulmonary vascularity are normal. Emphysematous changes and scattered fibrosis in the lungs. No focal consolidation or airspace disease is identified. No pleural effusions. No pneumothorax. Mediastinal contours appear intact. IMPRESSION: Emphysematous changes  and scattered fibrosis in the lungs. No focal consolidation. Electronically Signed   By: Burman Nieves M.D.   On: 11/09/2022 21:41    Review of Systems  Unable to perform ROS: Dementia   Blood pressure 103/65, pulse 85, temperature 98.3 F (36.8 C), resp. rate 18, height 5\' 8"  (1.727 m), weight 44.8 kg, SpO2 95%. Physical Exam Constitutional:      General: He is not in acute distress.    Appearance: He is well-developed. He is not diaphoretic.  HENT:     Head: Normocephalic and atraumatic.  Eyes:     General: No scleral icterus.       Right eye: No discharge.        Left eye: No discharge.     Conjunctiva/sclera: Conjunctivae normal.  Cardiovascular:     Rate and Rhythm: Normal rate and regular rhythm.  Pulmonary:     Effort: Pulmonary effort is normal. No respiratory distress.  Musculoskeletal:     Cervical back: Normal range of motion.     Comments: LLE No traumatic wounds, ecchymosis, or rash. Multiple LE ulcerations.  Nontender, large troch decub with exposed bone, foul odor  No knee or ankle effusion  Knee stable to varus/ valgus and anterior/posterior stress  Sens DPN, SPN, TN intact  Motor EHL, ext, flex, evers 1/5  DP 2+, PT 2+, No significant edema  Skin:    General: Skin is warm and dry.  Neurological:     Mental Status: He is alert.  Psychiatric:        Mood and Affect: Mood normal.  Behavior: Behavior normal.     Assessment/Plan: Left hip decub -- If the decision is made to move forward with something other than comfort care will need I&D and probably VAC placement with eventual grafting. Will await outcome of palliative consultation. For now care outlined by [facility] RN as well as offloading area. If decision is to move forward with aggressive care recommend transfer to [facility] or elsewhere as he'll need combined orthopedic and plastics coverage and we are not able to provide that this week.    Freeman Caldron, PA-C Orthopedic  Surgery [phone] 11/11/2022, 10:33 AM   I have seen and examined the patient and confirmed the relevant findings documented in the note above.  While he is considering palliative care options, there is no urgent surgical indication.  If he decides on aggressive treatment, then it will be necessary to transfer him to a tertiary care Medical Center for plastic surgery, general surgery and possible orthopedic evaluation and treatment.  I will sign off.

## 2022-11-11 NOTE — Progress Notes (Signed)
EEG complete - results pending 

## 2022-11-11 NOTE — Progress Notes (Signed)
PT Cancellation Note  Patient Details Name: Douglas Spencer MRN: [id number] DOB: 04-Mar-1945   Cancelled Treatment:    Reason Eval/Treat Not Completed: Patient at procedure or test/unavailable  Patient leaving room for CT on arrival. Will continue efforts to see for PT eval   Jerolyn Center, PT Acute Rehabilitation Services  Office [phone]  Zena Amos 11/11/2022, 12:07 PM

## 2022-11-11 NOTE — Progress Notes (Signed)
Initial Nutrition Assessment  DOCUMENTATION CODES:   Severe malnutrition in context of social or environmental circumstances  INTERVENTION:  Ice cream with each meal, Mighty shake with meals.   NUTRITION DIAGNOSIS:   Severe Malnutrition related to social / environmental circumstances as evidenced by percent weight loss, severe muscle depletion, severe fat depletion.    GOAL:   Patient will meet greater than or equal to 90% of their needs    MONITOR:   PO intake, Diet advancement, Supplement acceptance, Weight trends, Labs  REASON FOR ASSESSMENT:   Consult Wound healing  ASSESSMENT:   77 y.o. M, Principal problems; Severe Sepsis with lactic acidosis, Active problems: Open wound L hip. Pmhx; CVA, seizures, anxiety, bed bound. On arrival patient was wrapped in a urine saturated blanket which was stuck to his wounds. He had ants crawling all over his body.  Pt stated he lives at home with room mate that also helps take care of him.  He is bed bound. He reports poor intake with loss of appetite.Per SLP evaluation, Pt declined MBS, and stated he would not every want a PEG tube. He also said he would never want thickened liquids. SLP recommendation Dys1 thin liquids.   Pt stated UBW 120-125#, New Dentures that fit loosely. Stated he liked ice cream(vanilla) and cheese cake said he could live off them, other likes are pudding(vanilla),yogurt all yogurt,  Jello, Lime. Does like shakes stated that [name] makes them for him. Pt clarified that [name] was room mate that helps care for him. Does not like milk or chocolate. Not found of meat but will eat a little of it. Discussed protein needs and agreed to try Juven.   Meds;  Vitamin B12, Thiamine, folic acid, Magnesium sulfate, KCl, vancomycin,  Labs;Iron;13, K; 2.8, Folate; 4.1, Lactic Acid 2.7  Weight hx; 33% weight loss x 9 months 11/10/22 44.8 kg  02/21/22 67.1 kg  02/12/22 67.2 kg  01/29/22 66 kg   NUTRITION - FOCUSED PHYSICAL  EXAM:  Flowsheet Row Most Recent Value  Orbital Region Severe depletion  Upper Arm Region Severe depletion  Thoracic and Lumbar Region Severe depletion  Buccal Region Severe depletion  Temple Region Severe depletion  Clavicle Bone Region Severe depletion  Clavicle and Acromion Bone Region Severe depletion  Scapular Bone Region Severe depletion  Dorsal Hand Severe depletion  Patellar Region Severe depletion  Anterior Thigh Region Severe depletion  Posterior Calf Region Severe depletion  Edema (RD Assessment) None  Hair Reviewed  Eyes Reviewed  Mouth Reviewed  Skin Reviewed  Nails Reviewed       Diet Order:   Diet Order             Diet NPO time specified  Diet effective now                   EDUCATION NEEDS:   Education needs have been addressed  Skin:  Skin Assessment: Skin Integrity Issues: Skin Integrity Issues:: Stage II, Unstageable Stage II: Right Hip Unstageable: Left Hip  Last BM:  Prior to admission  Height:   Ht Readings from Last 1 Encounters:  11/10/22 5\' 8"  (1.727 m)    Weight:   Wt Readings from Last 1 Encounters:  11/10/22 44.8 kg    Ideal Body Weight:  70 kg  BMI:  Body mass index is 15.02 kg/m.  Estimated Nutritional Needs:   Kcal:  1900-2230 kcal (30-35kcal/kgIBW)  Protein:  83-95 g (1.3-1.5g/kgIBW)  Fluid:  1600-1900 ml    Rolly Magri  Robertlee Rogacki, RDN,LDN

## 2022-11-11 NOTE — Progress Notes (Addendum)
PROGRESS NOTE                                                                                                                                                                                                             Patient Demographics:    Douglas Spencer, is a 77 y.o. male, DOB - 1945/10/30, NWG:[id number]  Outpatient Primary MD for the patient is Douglas Grandchild, MD    LOS - 1  Admit date - 11/09/2022    Chief Complaint  Patient presents with   Altered Mental Status   Failure To Thrive       Brief Narrative (HPI from H&P)   77 y.o. male with medical history significant for history of CVA with residual left-sided weakness, dysarthria, cachectic, bedbound status, history of seizures, anxiety, former substance use who presented to the ED from home with altered mental status.  Currently living at home with a roommate who takes care of him.  He is immobile and bedbound.  He was confused for the past hour, EMS was called, and when they arrived he was retinae urine saturated blanket stuck to his wounds and had ants crawling all over his body.  Reports poor appetite.  He was found to be septic in the ER with tachycardia, leukocytosis, elevated lactic acid and was placed on broad-spectrum antibiotics and admitted to the hospital, possible sources were aspiration pneumonia and left sided sacral decubitus ulcer which were present on admission.   Subjective:    Douglas Spencer today in bed appears to be in no distress, minimally verbal, will answer few questions with nod of his head, denies any headache or chest pain.   Assessment  & Plan :   Severe sepsis due to large left hip wound lung with possible aspiration pneumonia-report noted, wound care team following, orthopedics also requested to see the patient to see if he would require any debridement.  Follow blood cultures, follow MRSA nasal PCR, for now continue IV antibiotics, speech  to follow as well to rule out ongoing aspiration.   History of CVA with left-sided weakness and contractures, bedbound - chronically ill with poor long-term prognosis.  Per report lives at home with a roommate who reportedly cares for him.  Arrived in urine soaked blanket and ants all over his body.  Continue supportive care, since mental status is not  good and we do not know his baseline will repeat head CT noncontrast , TSH, B1, folic acid, Ammonia, along with EEG.    B12 borderline low normal - placed on replacement.   Acute metabolic encephalopathy - the above.   Chronic hepatitis C-seen by ID as an outpatient, he completed treatment with Mavyret, with no detectable hep C RNA level.   Chronic diastolic CHF - most recent 2D echo 2022 showed normal LVEF 65 to 65%, grade 1 diastolic dysfunction.  Currently appears dry.   Mild macrocytic anemia - anemia panel.  Also check B1.   Elevated isolated bilirubin - unclear significance, AST, ALT, alk phos normal. Monitor.   History of seizures - continue Vimpat.  EEG as above.   Anxiety - continue Atarax as needed.       Condition - Extremely Guarded  Family Communication  : None present, caregiver [name] not reachable, cell phone does not seem to be working, message left on home phone number answering machine on 11/11/2022 at 10:30 AM  Code Status : Full code  Consults  : Orthopedics, palliative care  PUD Prophylaxis :    Procedures  :     CT -  1. Possible mild decubitus ulceration over the left greater trochanter and left ischium without evidence of underlying focal fluid collection, foreign body or soft tissue emphysema. 2. No CT evidence of osteomyelitis or septic arthritis. 3. No acute osseous findings.      Disposition Plan  :    Status is: Inpatient   DVT Prophylaxis  :    heparin injection 5,000 Units Start: 11/10/22 0600   Lab Results  Component Value Date   PLT 183 11/11/2022    Diet :  Diet Order              Diet NPO time specified  Diet effective now                    Inpatient Medications  Scheduled Meds:  aspirin  81 mg Oral Daily   atorvastatin  40 mg Oral Daily   cyanocobalamin  1,000 mcg Subcutaneous Daily   heparin  5,000 Units Subcutaneous Q8H   sodium chloride flush  3 mL Intravenous Q12H   sodium hypochlorite   Irrigation BID   thiamine (VITAMIN B1) injection  100 mg Intravenous Daily   Continuous Infusions:  ceFEPime (MAXIPIME) IV 2 g (11/10/22 2221)   dextrose 5% lactated ringers     folic acid 5 mg in sodium chloride 0.9 % 50 mL IVPB 102 mL/hr at 11/10/22 1552   lacosamide (VIMPAT) IV 50 mg (11/10/22 2134)   magnesium sulfate bolus IVPB 1 g (11/11/22 1022)   metronidazole 500 mg (11/10/22 2332)   potassium chloride 10 mEq (11/11/22 0916)   vancomycin 500 mg (11/11/22 0112)   PRN Meds:.hydrOXYzine, mouth rinse     Objective:   Vitals:   11/11/22 0000 11/11/22 0036 11/11/22 0433 11/11/22 0835  BP: (!) 101/59 109/70 103/65   Pulse: 67 77 85   Resp: 15 18 18    Temp:  98.5 F (36.9 C) 98.3 F (36.8 C)   TempSrc:  Oral  Oral  SpO2: 95% 95% 95%   Weight:      Height:        Wt Readings from Last 3 Encounters:  11/10/22 44.8 kg  02/21/22 67.1 kg  02/12/22 67.2 kg     Intake/Output Summary (Last 24 hours) at 11/11/2022 1028 Last data filed at 11/11/2022  4098 Gross per 24 hour  Intake 1168.08 ml  Output 500 ml  Net 668.08 ml     Physical Exam  Elderly cachectic white male, awake but minimally verbal, does not answer questions, moving his right-sided extremities more than the left, see decubitus ulcer description below present on admission [place].AT,PERRAL Supple Neck, No JVD,   Symmetrical Chest wall movement, Good air movement bilaterally, CTAB RRR,No Gallops,Rubs or new Murmurs,  +ve B.Sounds, Abd Soft, No tenderness,   No Cyanosis, Clubbing or edema     RN pressure injury documentation: Pressure Injury 11/10/22 Hip Right Stage 2 -  Partial  thickness loss of dermis presenting as a shallow open injury with a red, pink wound bed without slough. (Active)  11/10/22 1500  Location: Hip  Location Orientation: Right  Staging: Stage 2 -  Partial thickness loss of dermis presenting as a shallow open injury with a red, pink wound bed without slough.  Wound Description (Comments):   Present on Admission: Yes  Dressing Type Foam - Lift dressing to assess site every shift 11/10/22 2000     Pressure Injury 11/10/22 Hip Left Unstageable - Full thickness tissue loss in which the base of the injury is covered by slough (yellow, tan, gray, green or brown) and/or eschar (tan, brown or black) in the wound bed. (Active)  11/10/22 1500  Location: Hip  Location Orientation: Left  Staging: Unstageable - Full thickness tissue loss in which the base of the injury is covered by slough (yellow, tan, gray, green or brown) and/or eschar (tan, brown or black) in the wound bed.  Wound Description (Comments):   Present on Admission: Yes  Dressing Type Foam - Lift dressing to assess site every shift 11/11/22 0528      Data Review:    Recent Labs  Lab 11/09/22 2108 11/10/22 0340 11/11/22 0419  WBC 19.1* 12.3* 7.5  HGB 13.8 12.2* 9.8*  HCT 43.0 37.8* 30.0*  PLT 376 257 183  MCV 79.6* 78.9* 79.8*  MCH 25.6* 25.5* 26.1  MCHC 32.1 32.3 32.7  RDW 15.0 14.8 14.9  LYMPHSABS 1.3  --   --   MONOABS 1.1*  --   --   EOSABS 0.1  --   --   BASOSABS 0.1  --   --     Recent Labs  Lab 11/09/22 2108 11/09/22 2124 11/10/22 0029 11/10/22 0340 11/11/22 0419  NA 138  --   --  136 138  K 3.5  --   --  3.8 2.8*  CL 99  --   --  103 107  CO2 23  --   --  24 20*  ANIONGAP 16*  --   --  9 11  GLUCOSE 123*  --   --  105* 96  BUN 30*  --   --  25* 18  CREATININE 1.25*  --   --  0.94 0.63  AST 18  --   --  15 14*  ALT 11  --   --  11 9  ALKPHOS 62  --   --  56 47  BILITOT 1.4*  --   --  1.5* 0.7  ALBUMIN 2.8*  --   --  2.4* 2.0*  CRP  --   --   --   --   14.2*  PROCALCITON  --   --   --  0.65 0.56  LATICACIDVEN  --  6.2* 4.7* 2.7*  --   INR 1.1  --   --   --   --  MG  --   --   --   --  1.8  CALCIUM 9.1  --   --  8.5* 8.0*      Recent Labs  Lab 11/09/22 2108 11/09/22 2124 11/10/22 0029 11/10/22 0340 11/11/22 0419  CRP  --   --   --   --  14.2*  PROCALCITON  --   --   --  0.65 0.56  LATICACIDVEN  --  6.2* 4.7* 2.7*  --   INR 1.1  --   --   --   --   MG  --   --   --   --  1.8  CALCIUM 9.1  --   --  8.5* 8.0*    --------------------------------------------------------------------------------------------------------------- Lab Results  Component Value Date   CHOL 109 01/29/2022   HDL 30.60 (L) 01/29/2022   LDLCALC 49 01/29/2022   TRIG 148.0 01/29/2022   CHOLHDL 4 01/29/2022    Lab Results  Component Value Date   HGBA1C 5.3 01/31/2021   No results for input(s): "TSH", "T4TOTAL", "FREET4", "T3FREE", "THYROIDAB" in the last 72 hours. Recent Labs    11/10/22 0639  VITAMINB12 295  FOLATE 4.1*  FERRITIN 514*  TIBC 146*  IRON 13*   ------------------------------------------------------------------------------------------------------------------ Cardiac Enzymes No results for input(s): "CKMB", "TROPONINI", "MYOGLOBIN" in the last 168 hours.  Invalid input(s): "CK"  Micro Results Recent Results (from the past 240 hour(s))  Blood Culture (routine x 2)     Status: None (Preliminary result)   Collection Time: 11/09/22 10:01 PM   Specimen: BLOOD  Result Value Ref Range Status   Specimen Description BLOOD SITE NOT SPECIFIED  Final   Special Requests   Final    BOTTLES DRAWN AEROBIC AND ANAEROBIC Blood Culture adequate volume   Culture   Final    NO GROWTH 2 DAYS Performed at [facility], [address], [place], [place] [address]    Report Status PENDING  Incomplete  Blood Culture (routine x 2)     Status: None (Preliminary result)   Collection Time: 11/09/22 10:42 PM   Specimen: BLOOD LEFT FOREARM   Result Value Ref Range Status   Specimen Description BLOOD LEFT FOREARM  Final   Special Requests   Final    BOTTLES DRAWN AEROBIC AND ANAEROBIC Blood Culture adequate volume   Culture   Final    NO GROWTH 1 DAY Performed at [facility], [address], [place], [place] [address]    Report Status PENDING  Incomplete  Resp panel by RT-PCR (RSV, Flu A&B, Covid)     Status: None   Collection Time: 11/09/22 10:42 PM   Specimen: Nasal Swab  Result Value Ref Range Status   SARS Coronavirus 2 by RT PCR NEGATIVE NEGATIVE Final   Influenza A by PCR NEGATIVE NEGATIVE Final   Influenza B by PCR NEGATIVE NEGATIVE Final    Comment: (NOTE) The Xpert Xpress SARS-CoV-2/FLU/RSV plus assay is intended as an aid in the diagnosis of influenza from Nasopharyngeal swab specimens and should not be used as a sole basis for treatment. Nasal washings and aspirates are unacceptable for Xpert Xpress SARS-CoV-2/FLU/RSV testing.  Fact Sheet for Patients: BloggerCourse.com  Fact Sheet for Healthcare Providers: SeriousBroker.it  This test is not yet approved or cleared by the [place] FDA and has been authorized for detection and/or diagnosis of SARS-CoV-2 by FDA under an Emergency Use Authorization (EUA). This EUA will remain in effect (meaning this test can be used) for  the duration of the COVID-19 declaration under Section 564(b)(1) of the Act, 21 U.S.C. section 360bbb-3(b)(1), unless the authorization is terminated or revoked.     Resp Syncytial Virus by PCR NEGATIVE NEGATIVE Final    Comment: (NOTE) Fact Sheet for Patients: BloggerCourse.com  Fact Sheet for Healthcare Providers: SeriousBroker.it  This test is not yet approved or cleared by the [place] FDA and has been authorized for detection and/or diagnosis of SARS-CoV-2 by FDA under an Emergency Use Authorization (EUA). This  EUA will remain in effect (meaning this test can be used) for the duration of the COVID-19 declaration under Section 564(b)(1) of the Act, 21 U.S.C. section 360bbb-3(b)(1), unless the authorization is terminated or revoked.  Performed at [facility], [address], [place], [place] [address]     Radiology Reports DG Chest [place] 1 View  Result Date: 11/11/2022 CLINICAL DATA:  Shortness of breath EXAM: PORTABLE CHEST 1 VIEW COMPARISON:  CXR 09/01/17 FINDINGS: No pleural effusion. No pneumothorax. Normal cardiac and mediastinal contours. There are multilevel opacity airspace opacities in the left lung, suspicious for infection. No radiographically apparent displaced rib fractures. Visualized upper abdomen is unremarkable. IMPRESSION: Multifocal airspace opacities in the left lung are suspicious for infection. Electronically Signed   By: Lorenza Cambridge M.D.   On: 11/11/2022 08:14   CT HIP LEFT W CONTRAST  Result Date: 11/10/2022 CLINICAL DATA:  Sepsis with leukocytosis and multiple wounds. History of CVA with left-sided weakness. Patient is bed bound. Concern for osteomyelitis. EXAM: CT OF THE LOWER LEFT EXTREMITY WITH CONTRAST TECHNIQUE: Multidetector CT imaging of the left hip was performed according to the standard protocol following intravenous contrast administration. RADIATION DOSE REDUCTION: This exam was performed according to the departmental dose-optimization program which includes automated exposure control, adjustment of the mA and/or kV according to patient size and/or use of iterative reconstruction technique. CONTRAST:  75mL OMNIPAQUE IOHEXOL 350 MG/ML SOLN COMPARISON:  Radiographs 11/09/2022.  None additional. FINDINGS: Bones/Joint/Cartilage The patient's left hip is flexed approximately 90 degrees. There is no evidence of acute fracture, dislocation or femoral head osteonecrosis. The bones are demineralized. Mild degenerative changes at both hips without evidence of large hip  joint effusion. No cortical destruction identified to suggest osteomyelitis. Ligaments Suboptimally assessed by CT. Muscles and Tendons Mild generalized muscular atrophy. No focal intramuscular fluid collection or abnormal enhancement identified. Soft tissues Possible mild decubitus ulceration over the left greater trochanter and left ischium without evidence of underlying focal fluid collection, foreign body or soft tissue emphysema. No significant underlying subcutaneous inflammatory changes are identified. There is a moderate amount of stool within the rectum. Mild iliofemoral atherosclerosis noted. IMPRESSION: 1. Possible mild decubitus ulceration over the left greater trochanter and left ischium without evidence of underlying focal fluid collection, foreign body or soft tissue emphysema. 2. No CT evidence of osteomyelitis or septic arthritis. 3. No acute osseous findings. Electronically Signed   By: Carey Bullocks M.D.   On: 11/10/2022 09:32   DG Hip Unilat W or Wo Pelvis 2-3 Views Left  Result Date: 11/09/2022 CLINICAL DATA:  Left hip wound. EXAM: DG HIP (WITH OR WITHOUT PELVIS) 2-3V LEFT COMPARISON:  None Available. FINDINGS: Evaluation is limited due to patient's positioning. No obvious fracture or dislocation. The bones are osteopenic. The soft tissues are grossly unremarkable. IMPRESSION: 1. No obvious fracture or dislocation. 2. Osteopenia. Electronically Signed   By: Elgie Collard M.D.   On: 11/09/2022 23:13   DG Chest Port 1 View  Result Date:  11/09/2022 CLINICAL DATA:  Question of sepsis.  Failure to thrive. EXAM: PORTABLE CHEST 1 VIEW COMPARISON:  09/01/2017 FINDINGS: Patient positioning limits evaluation. Heart size and pulmonary vascularity are normal. Emphysematous changes and scattered fibrosis in the lungs. No focal consolidation or airspace disease is identified. No pleural effusions. No pneumothorax. Mediastinal contours appear intact. IMPRESSION: Emphysematous changes and scattered  fibrosis in the lungs. No focal consolidation. Electronically Signed   By: Burman Nieves M.D.   On: 11/09/2022 21:41      Signature  -   Susa Raring M.D on 11/11/2022 at 10:28 AM   -  To page go to [email or web address]

## 2022-11-11 NOTE — Progress Notes (Signed)
Peripherally Inserted Central Catheter Placement  The IV Nurse has discussed with the patient and/or persons authorized to consent for the patient, the purpose of this procedure and the potential benefits and risks involved with this procedure.  The benefits include less needle sticks, lab draws from the catheter, and the patient may be discharged home with the catheter. Risks include, but not limited to, infection, bleeding, blood clot (thrombus formation), and puncture of an artery; nerve damage and irregular heartbeat and possibility to perform a PICC exchange if needed/ordered by physician.  Alternatives to this procedure were also discussed.  Bard Power PICC patient education guide, fact sheet on infection prevention and patient information card has been provided to patient /or left at bedside.  Consent obtained from cousin due to periods of confusion.  PICC inserted by Elenore Paddy, RN  PICC Placement Documentation  PICC Double Lumen 11/11/22 Right Brachial 37 cm 0 cm (Active)  Indication for Insertion or Continuance of Line Prolonged intravenous therapies;Chronic illness with exacerbations (CF, Sickle Cell, etc.) 11/11/22 1716  Exposed Catheter (cm) 0 cm 11/11/22 1716  Site Assessment Clean, Dry, Intact 11/11/22 1716  Lumen #1 Status Flushed;Saline locked;Blood return noted 11/11/22 1716  Lumen #2 Status Flushed;Saline locked;Blood return noted 11/11/22 1716  Dressing Type Transparent;Securing device 11/11/22 1716  Dressing Status Antimicrobial disc in place;Clean, Dry, Intact 11/11/22 1716  Line Adjustment (NICU/IV Team Only) No 11/11/22 1716  Dressing Intervention New dressing 11/11/22 1716  Dressing Change Due 11/18/22 11/11/22 1716       Douglas Spencer, Douglas Spencer 11/11/2022, 5:16 PM

## 2022-11-11 NOTE — Consult Note (Signed)
WOC Nurse Consult Note: Reason for Consult: large hip wound Patient from home AMS, poorly cared for, bed bound, contracted, covered in ants and urine soaked comforter adhered to patients left trochanter wound  Wound type: Unstageable pressure Injury: left trocanter In images reviewed, appears to have lesions all over LLE, unsure of etiology, but if covered in bugs would say possibility of bug envenomation  Stage 2 right trochanter per nursing flow sheet  Pressure Injury POA: Yes Measurement:see nursing flow sheets  Wound bed:90% eschar with circumferential pink ruddy tissue. Scattered areas of necrosis at wound edge proximally (12 o'clock 3 areas of non viable brown eschar) Drainage (amount, consistency, odor) bloody/yellow observed in images  Periwound: lesions covering LLE  Dressing procedure/placement/frequency: Left trochanter; Add topical care; 1/4% Dakins solution to wound bed x 7 days, top with ABD pads, secure with tape.  Silicone foam to the right trochanter, change every 3 days  LALM for moisture management and pressure redistribution  Consider orthopedic evaluation for debridement if aggressive care desired.   Re consult if needed, will not follow at this time. Thanks  Merrily Tegeler [employer], RN,CWOCN, CNS, CWON-AP [phone])

## 2022-11-11 NOTE — Consult Note (Signed)
Consultation Note Date: 11/11/2022   Patient Name: Douglas Spencer  DOB: Jul 12, 1945  MRN: [id number]  Age / Sex: 77 y.o., male  PCP: Douglas Grandchild, MD Referring Physician: Leroy Sea, MD  Reason for Consultation: Establishing goals of care, "GOC, declining, poor functional status "  HPI/Patient Profile: 77 y.o. male  with past medical history of CVA with residual left-sided weakness, dysarthria, cachectic, bedbound status, seizures, anxiety, former substance use was admitted on 11/09/2022 from home with severe sepsis secondary to large left hip wound.   Clinical Assessment and Goals of Care:  10:00 AM I have reviewed medical records including EPIC notes, labs, and imaging. Received report from primary RN - no acute concerns.   Went to visit patient at bedside - no family/visitors present. Patient was lying in bed awake, alert, oriented to name, place, situation - thinks it's December 2024, and able to participate in conversation. He clearly knows he was admitted for sepsis. No signs or non-verbal gestures of pain or discomfort noted. No respiratory distress, increased work of breathing, or secretions noted. He does seem very ill and frail appearing.   Dietitian at bedside - will return at a later time.  2:00 PM Returned to bedside - patient remains awake, alert, oriented to name, place, time of Sept 2024, situation. He knows he was admitted for "a bad infection." It is uncertain if patient is able to make complex medical decisions.   Met with patient  to discuss diagnosis, prognosis, GOC, EOL wishes, disposition, and options.  I introduced Palliative Medicine as specialized medical care for people living with serious illness. It focuses on providing relief from the symptoms and stress of a serious illness. The goal is to improve quality of life for both the patient and the family.  We discussed a brief  life review of the patient as well as functional and nutritional status. Patient is not married - he has no children. His next of kin is his brother/Douglas Spencer who he tells me should be visiting him this afternoon. Prior to hospitalization, he lived in a private home with his friend/roommate/Douglas Spencer.   We discussed patient's current illness and what it means in the larger context of patient's on-going co-morbidities.  Patient does remember and understand the information given to him by Douglas Spencer earlier today - he states "I don't want all that bullsh*t." He tells me the scans earlier today made him very tired. Natural disease trajectory and expectations at EOL were discussed. I attempted to elicit values and goals of care important to the patient. The difference between aggressive medical intervention and comfort care was considered in light of the patient's goals of care.   We talked about transition to comfort measures in house and what that would entail inclusive of medications to control pain, dyspnea, agitation, nausea, and itching. We discussed stopping all unnecessary measures such as blood draws, needle sticks, oxygen, antibiotics, CBGs/insulin, cardiac monitoring, IVF, and frequent vital signs. Education provided that other non-pharmacological interventions would be utilized for holistic support and comfort such as spiritual support if requested, repositioning, music therapy, offering comfort feeds, and/or therapeutic listening. All care would focus on how the patient is looking and feeling.   Patient clearly tells me he is ready for comfort care - he does not wish to pursue aggressive medical interventions to prolong his life. He repeats back to me that hospice "is for people that are going to die and they keep them comfortable."   If patient is ever unable to  speak for himself, he would want his roommate/friend/Douglas Spencer to make decisions on his behalf, not his brother. Offered to call Douglas Spencer to give him updates  - patient is appreciative.  2:11 PM Attempted to call mobile number listed for Douglas Spencer - this connected me with someone named Douglas Spencer who stated this was a wrong number.    Attempted to call Home number listed - no answer - confidential voicemail left with request to return call.  2:18 PM Re-entered patient's room - provided updates unable to speak with his friend. I ask if he would like me to call his brother - he indicates no, he should be coming this afternoon; though, he was ok I call to confirm his visit but not to provide medical information. Circled back around to topics relating to comfort/hospice care. I still remain uncertain if he fully understands more complex topics/discussions related to transition to comfort/hospice. Patient tells me this time, "I am not sure (if he is ready for comfort/hospice."   Therapeutic listening provided as patient reflects on his friend that passed away earlier this week from lung cancer.   Offered to return tomorrow to continue discussions related to GOC - he expresses appreciation.  Concepts specific to code status were reviewed - patient indicates that "when it's my time, it's my time." He would prefer a peaceful, natural passing without attempts at resuscitation.  Discussed with patient the importance of continued conversation with family/friend and the medical providers regarding overall plan of care and treatment options, ensuring decisions are within the context of the patient's values and GOCs.    Questions and concerns were addressed.   2:18 PM Attempted to call his brother Douglas Spencer to see when he may be arriving to the hospital - no answer - confidential voicemail left. Number found in chart on 02/06/21 discharge summary: [phone] - unsure if this number is still accurate.   Discussed case in detail with Dr. Thedore Spencer.   3:07 PM Brother/Douglas Spencer returned PMT phone call - he confirms he was aware patient was admitted and he will be visiting with  patient this afternoon. Douglas Spencer did ask for updates - I did not provide any medical updates or further information per patient's request. Douglas Spencer tells me that patient's friend/Douglas Spencer has not let family see patient for a long time despite many attempts and family have been concerned about him.    Primary Decision Maker: PATIENT    SUMMARY OF RECOMMENDATIONS   Continue current medical treatment Now DNR/DNI  Patient requested additional time to consider transition to full comfort/hospice - PMT will follow up tomorrow 10/1 PMT will continue to follow and support holistically   Code Status/Advance Care Planning: DNR  Palliative Prophylaxis:  Aspiration, Frequent Pain Assessment, Oral Care, and Turn Reposition  Additional Recommendations (Limitations, Scope, Preferences): Full Scope Treatment  Psycho-social/Spiritual:  Desire for further Chaplaincy support:no Created space and opportunity for patient and family to express thoughts and feelings regarding patient's current medical situation.  Emotional support and therapeutic listening provided.  Prognosis:  Unable to determine  Discharge Planning: To Be Determined      Primary Diagnoses: Present on Admission:  Severe sepsis with lactic acidosis (HCC)   I have reviewed the medical record, interviewed the patient and family, and examined the patient. The following aspects are pertinent.  Past Medical History:  Diagnosis Date   ETOH abuse    Substance abuse (HCC)    Social History   Socioeconomic History   Marital status: Single    Spouse name:  Not on file   Number of children: Not on file   Years of education: Not on file   Highest education level: Not on file  Occupational History   Not on file  Tobacco Use   Smoking status: Former    Current packs/day: 0.50    Types: Cigarettes   Smokeless tobacco: Not on file  Vaping Use   Vaping status: Former  Substance and Sexual Activity   Alcohol use: Not Currently     Comment: pt states he drinks multiple beers every day.   Drug use: Not Currently    Types: Marijuana, Cocaine   Sexual activity: Not Currently  Other Topics Concern   Not on file  Social History Narrative   Not on file   Social Determinants of Health   Financial Resource Strain: Low Risk  (02/21/2022)   Overall Financial Resource Strain (CARDIA)    Difficulty of Paying Living Expenses: Not hard at all  Food Insecurity: Patient Unable To Answer (11/10/2022)   Hunger Vital Sign    Worried About Running Out of Food in the Last Year: Patient unable to answer    Ran Out of Food in the Last Year: Patient unable to answer  Transportation Needs: Patient Unable To Answer (11/10/2022)   PRAPARE - Transportation    Lack of Transportation (Medical): Patient unable to answer    Lack of Transportation (Non-Medical): Patient unable to answer  Physical Activity: Inactive (02/21/2022)   Exercise Vital Sign    Days of Exercise per Week: 0 days    Minutes of Exercise per Session: 0 min  Stress: No Stress Concern Present (02/21/2022)   [employer] of Occupational Health - Occupational Stress Questionnaire    Feeling of Stress : Not at all  Social Connections: Unknown (02/21/2022)   Social Connection and Isolation Panel [NHANES]    Frequency of Communication with Friends and Family: More than three times a week    Frequency of Social Gatherings with Friends and Family: More than three times a week    Attends Religious Services: Patient declined    Database administrator or Organizations: No    Attends Banker Meetings: Patient declined    Marital Status: Never married   No family history on file. Scheduled Meds:  aspirin  81 mg Oral Daily   atorvastatin  40 mg Oral Daily   cyanocobalamin  1,000 mcg Subcutaneous Daily   heparin  5,000 Units Subcutaneous Q8H   sodium chloride flush  3 mL Intravenous Q12H   sodium hypochlorite   Irrigation BID   thiamine (VITAMIN B1) injection  100  mg Intravenous Daily   Continuous Infusions:  ceFEPime (MAXIPIME) IV 2 g (11/10/22 2221)   dextrose 5% lactated ringers     folic acid 5 mg in sodium chloride 0.9 % 50 mL IVPB 102 mL/hr at 11/10/22 1552   lacosamide (VIMPAT) IV 50 mg (11/10/22 2134)   magnesium sulfate bolus IVPB     metronidazole 500 mg (11/10/22 2332)   potassium chloride 10 mEq (11/11/22 0916)   vancomycin 500 mg (11/11/22 0112)   PRN Meds:.hydrOXYzine, mouth rinse Medications Prior to Admission:  Prior to Admission medications   Medication Sig Start Date End Date Taking? Authorizing Provider  atorvastatin (LIPITOR) 40 MG tablet TAKE 1 TABLET BY MOUTH EVERY DAY 10/23/22  Yes Douglas Grandchild, MD  hydrOXYzine (ATARAX) 25 MG tablet TAKE 1 TABLET BY MOUTH 2 TIMES DAILY AS NEEDED FOR ANXIETY. 06/14/22  Yes Douglas Grandchild,  MD  lacosamide (VIMPAT) 50 MG TABS tablet TAKE 1 TABLET BY MOUTH TWICE A DAY 11/07/22  Yes Douglas Grandchild, MD  aspirin 81 MG chewable tablet Chew 1 tablet (81 mg total) by mouth daily. 03/05/21   Jones Bales, NP   No Known Allergies Review of Systems  Respiratory:  Negative for shortness of breath.   Gastrointestinal:  Negative for nausea and vomiting.  Skin:  Positive for wound.  Neurological:  Positive for weakness.  All other systems reviewed and are negative.   Physical Exam Vitals and nursing note reviewed.  Constitutional:      General: He is not in acute distress.    Appearance: He is cachectic. He is ill-appearing.  Pulmonary:     Effort: No respiratory distress.  Skin:    General: Skin is warm and dry.     Findings: Wound present.  Neurological:     Mental Status: He is alert and oriented to person, place, and time.     Motor: Weakness present.  Psychiatric:        Attention and Perception: Attention normal.        Behavior: Behavior is cooperative.        Cognition and Memory: Cognition and memory normal.     Vital Signs: BP 103/65   Pulse 85   Temp 98.3 F (36.8 C)    Resp 18   Ht 5\' 8"  (1.727 m)   Wt 44.8 kg   SpO2 95%   BMI 15.02 kg/m  Pain Scale: PAINAD       SpO2: SpO2: 95 % O2 Device:SpO2: 95 % O2 Flow Rate: .O2 Flow Rate (L/min): 2 L/min  IO: Intake/output summary:  Intake/Output Summary (Last 24 hours) at 11/11/2022 0955 Last data filed at 11/11/2022 0528 Gross per 24 hour  Intake 1168.08 ml  Output 500 ml  Net 668.08 ml    LBM:   Baseline Weight: Weight: 43.7 kg Most recent weight: Weight: 44.8 kg     Palliative Assessment/Data: PPS 30%     Time In/Out: 1020-1030/1400-1315 Time Total: 95 minutes  Greater than 50%  of this time was spent counseling and coordinating care related to the above assessment and plan.  Signed by: Haskel Khan, NP   Please contact Palliative Medicine Team phone at [phone] for questions and concerns.  For individual provider: See Amion  *Portions of this note are a verbal dictation therefore any spelling and/or grammatical errors are due to the "Dragon Medical One" system interpretation.

## 2022-11-11 NOTE — TOC Progression Note (Signed)
Transition of Care [facility]) - Progression Note    Patient Details  Name: Douglas Spencer MRN: [id number] Date of Birth: 08/02/1945  [facility] of [facility]) CM/SW Contact  Mearl Latin, LCSW Phone Number: 11/11/2022, 2:33 PM  Clinical Narrative:    CSW left voicemail for Mr. Sink to ascertain patient's baseline functioning and how it has been caring for him at home.     Barriers to Discharge: Continued Medical Work up  Expected Discharge Plan and Services In-house Referral: Clinical Social Work, Hospice / Palliative Care     Living arrangements for the past 2 months: Single Family Home                                       Social Determinants of Health (SDOH) Interventions SDOH Screenings   Food Insecurity: Patient Unable To Answer (11/10/2022)  Housing: Patient Unable To Answer (11/10/2022)  Transportation Needs: Patient Unable To Answer (11/10/2022)  Utilities: Patient Unable To Answer (11/10/2022)  Alcohol Screen: Low Risk  (02/21/2022)  Depression (PHQ2-9): Low Risk  (02/21/2022)  Financial Resource Strain: Low Risk  (02/21/2022)  Physical Activity: Inactive (02/21/2022)  Social Connections: Unknown (02/21/2022)  Stress: No Stress Concern Present (02/21/2022)  Tobacco Use: Medium Risk (02/21/2022)    Readmission Risk Interventions     No data to display

## 2022-11-11 NOTE — Procedures (Signed)
Patient Name: Douglas Spencer  MRN: [id number]  Epilepsy Attending: Charlsie Quest  Referring Physician/Provider: Leroy Sea, MD  Date: 11/11/2022  Duration: 22.28 mins  Patient history: 77yo M with ams getting eeg to evaluate for seizure  Level of alertness: Awake  AEDs during EEG study: None  Technical aspects: This EEG study was done with scalp electrodes positioned according to the 10-20 International system of electrode placement. Electrical activity was reviewed with band pass filter of 1-70Hz , sensitivity of 7 uV/mm, display speed of 75mm/sec with a 60Hz  notched filter applied as appropriate. EEG data were recorded continuously and digitally stored.  Video monitoring was available and reviewed as appropriate.  Description: The posterior dominant rhythm consists of 7 Hz activity of moderate voltage (25-35 uV) seen predominantly in posterior head regions, symmetric and reactive to eye opening and eye closing. EEG showed continuous generalized 5 to 7 Hz theta slowing. Hyperventilation and photic stimulation were not performed.     ABNORMALITY - Continuous slow, generalized  IMPRESSION: This study is suggestive of mild to moderate diffuse encephalopathy. No seizures or epileptiform discharges were seen throughout the recording.  Douglas Spencer

## 2022-11-11 NOTE — TOC Progression Note (Signed)
Transition of Care [facility]) - Progression Note    Patient Details  Name: Douglas Spencer MRN: [id number] Date of Birth: 12-May-1945  [facility] of [facility]) CM/SW Contact  Gordy Clement, RN Phone Number: 11/11/2022, 9:25 AM  Clinical Narrative:     CM acknowledges "Consult to [facility]" for HH/DME and Med needs. TOC will follow for recommendations          Expected Discharge Plan and Services                                               Social Determinants of Health (SDOH) Interventions SDOH Screenings   Food Insecurity: Patient Unable To Answer (11/10/2022)  Housing: Patient Unable To Answer (11/10/2022)  Transportation Needs: Patient Unable To Answer (11/10/2022)  Utilities: Patient Unable To Answer (11/10/2022)  Alcohol Screen: Low Risk  (02/21/2022)  Depression (PHQ2-9): Low Risk  (02/21/2022)  Financial Resource Strain: Low Risk  (02/21/2022)  Physical Activity: Inactive (02/21/2022)  Social Connections: Unknown (02/21/2022)  Stress: No Stress Concern Present (02/21/2022)  Tobacco Use: Medium Risk (02/21/2022)    Readmission Risk Interventions     No data to display

## 2022-11-11 NOTE — Progress Notes (Signed)
PT Cancellation Note  Patient Details Name: Douglas Spencer MRN: [id number] DOB: 1945-10-22   Cancelled Treatment:    Reason Eval/Treat Not Completed: Patient at procedure or test/unavailable  EEG being applied. Will continue attempts to see pt.    Jerolyn Center, PT Acute Rehabilitation Services  Office [phone]  Zena Amos 11/11/2022, 1:38 PM

## 2022-11-11 NOTE — Progress Notes (Signed)
At bedside for PICC placement. Pt noted to be confused. NP at bedside. Was informed pt is likely to go on hospice care, and will follow up. RN aware.

## 2022-11-11 NOTE — Evaluation (Signed)
Clinical/Bedside Swallow Evaluation Patient Details  Name: Douglas Spencer MRN: [id number] Date of Birth: 11-05-1945  Today's Date: 11/11/2022 Time: SLP Start Time (ACUTE ONLY): 1010 SLP Stop Time (ACUTE ONLY): 0030 SLP Time Calculation (min) (ACUTE ONLY): 860 min  Past Medical History:  Past Medical History:  Diagnosis Date   ETOH abuse    Substance abuse (HCC)    Past Surgical History: No past surgical history on file. HPI:  Douglas Spencer is a 77 y.o. male admitted with sepsis. Pt with evident FTT, found with AMS.  Multifocal airspace opacities in the left lung are suspicious for infection. Currently living at home with a roommate who takes care of him. He is immobile and bedbound. He was confused for the past hour, EMS was called, and when they arrived he was retinae urine saturated blanket stuck to his wounds and had ants crawling all over his body.with medical history significant for history of CVA with residual left-sided weakness, dysarthria, cachectic, bedbound status, history of seizures, anxiety, former substance use. Pt has a history of dysphagia, Last MBS in 2023 completed in CIR after second CVA demonstrate sensed aspiration of thin liquids, prominent CP segment. Pt recommended to consume dys 2/nectar at that time.    Assessment / Plan / Recommendation  Clinical Impression  Pt initially presents as confused, with dry, encrusted oral mucosa. RN and NT cleaned pt up and both RN and SLP completed oral care. Dentures placed. Pt reports he eats and drink little at home with poor appetite. Likes cheese cake and ice cream. His friend props him up with a pillow. He reports history of dysphagia and mentions prior MBS studies and thickened liquids. When taking sips today pt had intermittent coughing and reports he didnt realize he was getting choked at home, but maybe he was. He tolerated applesauce well and when asked what he would like he named jello, pudding and mashed potatoes. SLP asked  him if he would want to do another MBS and he very firmly said "no!" and also said he would never want thickened liquids or a feeding tube. Though he was not fully oriented to recent events he is attentive and able to participate in conversation about wants and needs. Pt is planning to have a discussion with palliative care provider. If his statements about goals are repeated, I would recommend purees and thin liquid diet with known risk, provider can write diet. Risk of aspiration and malnutrition will be chronic. RN ok to give meds in puree now. SLP Visit Diagnosis: Dysphagia, oropharyngeal phase (R13.12)    Aspiration Risk  Moderate aspiration risk;Risk for inadequate nutrition/hydration    Diet Recommendation Dysphagia 1 (Puree);Thin liquid    Liquid Administration via: Cup;Straw Medication Administration: Whole meds with puree Supervision: Staff to assist with self feeding Postural Changes: Seated upright at 90 degrees    Other  Recommendations Oral Care Recommendations: Oral care BID    Recommendations for follow up therapy are one component of a multi-disciplinary discharge planning process, led by the attending physician.  Recommendations may be updated based on patient status, additional functional criteria and insurance authorization.  Follow up Recommendations No SLP follow up      Assistance Recommended at Discharge    Functional Status Assessment    Frequency and Duration min 2x/week  2 weeks       Prognosis Prognosis for improved oropharyngeal function: Guarded Barriers to Reach Goals: Severity of deficits      Swallow Study   General HPI: Douglas Spencer  is a 77 y.o. male admitted with sepsis. Pt with evident FTT, found with AMS.  Multifocal airspace opacities in the left lung are suspicious for infection. Currently living at home with a roommate who takes care of him. He is immobile and bedbound. He was confused for the past hour, EMS was called, and when they arrived  he was retinae urine saturated blanket stuck to his wounds and had ants crawling all over his body.with medical history significant for history of CVA with residual left-sided weakness, dysarthria, cachectic, bedbound status, history of seizures, anxiety, former substance use. Pt has a history of dysphagia, Last MBS in 2023 completed in CIR after second CVA demonstrate sensed aspiration of thin liquids, prominent CP segment. Pt recommended to consume dys 2/nectar at that time. Type of Study: Bedside Swallow Evaluation Previous Swallow Assessment: MBS 02/20/21 Patient continues to demonstrate a moderate oropharyngeal dysphagia. Oral phase is characterized by impaired mastication and left anterior spillage with both solids and liquids. Pharyngeal phase is characterized by mild base of tongue weakness and reduced epiglottic inversion resulting in mild residue that cleared with a second swallow. Patient also demonstrated reduced laryngeal closure with inconsistent timing resulting in intermittent penetration and instance of sensed aspiration of thin liquids.  Patient also with one episode of penetration of pureed textures to the level of the vocal cords that cleared with a cued throat clear.  Recommend patient continue current diet of Dys. 2 textures with nectar-thick liquids.  Patient verbalized understanding and agreement. Of note, patient with what appears to be a prominent cricopharyngeal segment and mild retention of bolus within the upper esophagus with patient reports a long history of esophageal related symptoms. Discussed with physician the need for a possible GI consult. Diet Prior to this Study: NPO Temperature Spikes Noted: No Respiratory Status: Room air History of Recent Intubation: No Behavior/Cognition: Alert;Cooperative;Pleasant mood Oral Cavity Assessment: Within Functional Limits Oral Care Completed by SLP: No Oral Cavity - Dentition: Edentulous;Dentures, bottom;Dentures, top Vision:  Functional for self-feeding Self-Feeding Abilities: Needs assist Patient Positioning: Upright in bed Baseline Vocal Quality: Normal Volitional Cough: Strong Volitional Swallow: Able to elicit    Oral/Motor/Sensory Function Overall Oral Motor/Sensory Function: Within functional limits   Ice Chips     Thin Liquid Thin Liquid: Impaired Presentation: Straw Pharyngeal  Phase Impairments: Suspected delayed Swallow;Cough - Immediate    Nectar Thick Nectar Thick Liquid: Not tested   Honey Thick Honey Thick Liquid: Not tested   Puree Puree: Within functional limits Presentation: Spoon   Solid     Solid: Not tested      Claudine Mouton 11/11/2022,11:21 AM

## 2022-11-11 NOTE — Progress Notes (Addendum)
Palliative Medicine Progress Note:  4:00 PM Received notification from RN that patient's family were at bedside requesting to see me.   Went to visit patient at bedside - his cousin/[name] and her husband/[name] were present, they included patient's brother/[name] into conversation via speakerphone. I obtained permission from the patient to provide medical updates and answer questions for his family - he expressed appreciation.   Discussed patient's interval history since admission. Reviewed his severe sepsis due to large left hip wound and Orthopedic's insight that if he wished to pursue aggressive care it would likely include transfer to [facility] for I&D, VAC placement, and eventual grafting. The difference between aggressive medical intervention and comfort care was considered in light of the patient's goals of care.   Allowed space and time for patient and family to discuss information. Patient again confirms with family that he would not want to pursue aggressive care and is "leaning toward hospice care." Patient will continue to reflect on information given today and discuss with family. He and family would like to meet tomorrow 10/1 at 3p for final decisions.   I asked patient again if he were ever unable to make decisions for himself, whom he would want to make decisions on his behalf - he again tells me [name]. I ask patient if he trusted [name] to make the best decisions for him - the same ones he would want for himself. Patient tells me "well no, I wouldn't trust him to do that. I would trust [name]." Patient indicates he would want [name] to be his surrogate decision maker if needed. [name] has known him "since kindergarten and would take care of me."   All family confirm they would support patient's wishes, whatever he decides.   [name] and I discuss patient's condition prior to his arrival to the hospital. Patient has has progressive weakness since March of this year that very quickly lead to his  bedbound status. His friend/[name] relies on patient for housing and financial income as he does not have any himself. Social Services were called to the home recently due to the very poor condition of the home and because [name] would not let family speak with patient.   Patient and family express appreciation for PMT return visit this afternoon.  Of note, patient gave RN permission to add [name] and [name] to emergency contact list.  All questions and concerns addressed. Encouraged to call with questions and/or concerns. PMT card provided.  PMT will follow up tomorrow 10/1 at 3p for family meeting and continued GOC - patient feels he will be ready to make final decisions at that time.   Douglas Spencer M. [facility] Palliative Medicine Team Team Phone: [phone] minutes

## 2022-11-12 DIAGNOSIS — E872 Acidosis, unspecified: Secondary | ICD-10-CM | POA: Diagnosis not present

## 2022-11-12 DIAGNOSIS — Z7189 Other specified counseling: Secondary | ICD-10-CM | POA: Diagnosis not present

## 2022-11-12 DIAGNOSIS — R6520 Severe sepsis without septic shock: Secondary | ICD-10-CM | POA: Diagnosis not present

## 2022-11-12 DIAGNOSIS — A419 Sepsis, unspecified organism: Secondary | ICD-10-CM | POA: Diagnosis not present

## 2022-11-12 DIAGNOSIS — Z66 Do not resuscitate: Secondary | ICD-10-CM | POA: Diagnosis not present

## 2022-11-12 DIAGNOSIS — Z515 Encounter for palliative care: Secondary | ICD-10-CM | POA: Diagnosis not present

## 2022-11-12 LAB — CBC WITH DIFFERENTIAL/PLATELET
Abs Immature Granulocytes: 0.04 10*3/uL (ref 0.00–0.07)
Basophils Absolute: 0 10*3/uL (ref 0.0–0.1)
Basophils Relative: 0 %
Eosinophils Absolute: 0.1 10*3/uL (ref 0.0–0.5)
Eosinophils Relative: 1 %
HCT: 30 % — ABNORMAL LOW (ref 39.0–52.0)
Hemoglobin: 9.8 g/dL — ABNORMAL LOW (ref 13.0–17.0)
Immature Granulocytes: 0 %
Lymphocytes Relative: 14 %
Lymphs Abs: 1.2 10*3/uL (ref 0.7–4.0)
MCH: 26.1 pg (ref 26.0–34.0)
MCHC: 32.7 g/dL (ref 30.0–36.0)
MCV: 80 fL (ref 80.0–100.0)
Monocytes Absolute: 0.4 10*3/uL (ref 0.1–1.0)
Monocytes Relative: 4 %
Neutro Abs: 7.4 10*3/uL (ref 1.7–7.7)
Neutrophils Relative %: 81 %
Platelets: 206 10*3/uL (ref 150–400)
RBC: 3.75 MIL/uL — ABNORMAL LOW (ref 4.22–5.81)
RDW: 14.7 % (ref 11.5–15.5)
WBC: 9.1 10*3/uL (ref 4.0–10.5)
nRBC: 0 % (ref 0.0–0.2)

## 2022-11-12 LAB — C-REACTIVE PROTEIN: CRP: 8.1 mg/dL — ABNORMAL HIGH (ref <1.0)

## 2022-11-12 LAB — BASIC METABOLIC PANEL
Anion gap: 6 (ref 5–15)
BUN: 13 mg/dL (ref 8–23)
CO2: 21 mmol/L — ABNORMAL LOW (ref 22–32)
Calcium: 7.5 mg/dL — ABNORMAL LOW (ref 8.9–10.3)
Chloride: 107 mmol/L (ref 98–111)
Creatinine, Ser: 0.67 mg/dL (ref 0.61–1.24)
GFR, Estimated: >60 mL/min (ref >60)
Glucose, Bld: 92 mg/dL (ref 70–99)
Potassium: 2.9 mmol/L — ABNORMAL LOW (ref 3.5–5.1)
Sodium: 134 mmol/L — ABNORMAL LOW (ref 135–145)

## 2022-11-12 LAB — PROCALCITONIN: Procalcitonin: 0.3 ng/mL

## 2022-11-12 LAB — GLUCOSE, CAPILLARY
Glucose-Capillary: 83 mg/dL (ref 70–99)
Glucose-Capillary: 80 mg/dL (ref 70–99)
Glucose-Capillary: 99 mg/dL (ref 70–99)

## 2022-11-12 LAB — MRSA NEXT GEN BY PCR, NASAL: MRSA by PCR Next Gen: NOT DETECTED

## 2022-11-12 LAB — MAGNESIUM: Magnesium: 1.9 mg/dL (ref 1.7–2.4)

## 2022-11-12 MED ORDER — GLYCOPYRROLATE 0.2 MG/ML IJ SOLN: 0.2000 mg | INTRAMUSCULAR | Status: DC | PRN

## 2022-11-12 MED ORDER — OXYCODONE HCL 5 MG PO TABS: 5.0000 mg | ORAL_TABLET | ORAL | Status: DC | PRN

## 2022-11-12 MED ORDER — GLYCOPYRROLATE 1 MG PO TABS: 1.0000 mg | ORAL_TABLET | ORAL | Status: DC | PRN

## 2022-11-12 MED ORDER — LORAZEPAM 1 MG PO TABS: 1.0000 mg | ORAL_TABLET | ORAL | Status: DC | PRN

## 2022-11-12 MED ORDER — ACETAMINOPHEN 325 MG PO TABS: 650.0000 mg | ORAL_TABLET | Freq: Four times a day (QID) | ORAL | Status: DC | PRN

## 2022-11-12 MED ORDER — LORAZEPAM 2 MG/ML PO CONC: 1.0000 mg | ORAL | Status: DC | PRN

## 2022-11-12 MED ORDER — HALOPERIDOL 1 MG PO TABS: 2.0000 mg | ORAL_TABLET | Freq: Four times a day (QID) | ORAL | Status: DC | PRN

## 2022-11-12 MED ORDER — HALOPERIDOL LACTATE 2 MG/ML PO CONC: 2.0000 mg | Freq: Four times a day (QID) | ORAL | Status: DC | PRN

## 2022-11-12 MED ORDER — DOXYCYCLINE HYCLATE 100 MG PO TABS
100.0000 mg | ORAL_TABLET | Freq: Two times a day (BID) | ORAL | Status: DC
  Administered 2022-11-12 – 2022-11-13 (×3): 100 mg via ORAL
  Filled 2022-11-12 (×3): qty 1

## 2022-11-12 MED ORDER — MORPHINE SULFATE (PF) 2 MG/ML IV SOLN
2.0000 mg | INTRAVENOUS | Status: DC | PRN
  Administered 2022-11-13: 2 mg via INTRAVENOUS
  Filled 2022-11-12: qty 1

## 2022-11-12 MED ORDER — POTASSIUM CHLORIDE 10 MEQ/100ML IV SOLN
10.0000 meq | INTRAVENOUS | Status: AC
  Administered 2022-11-12 (×6): 10 meq via INTRAVENOUS
  Filled 2022-11-12 (×6): qty 100

## 2022-11-12 MED ORDER — LORAZEPAM 2 MG/ML IJ SOLN: 1.0000 mg | INTRAMUSCULAR | Status: DC | PRN

## 2022-11-12 MED ORDER — HALOPERIDOL LACTATE 5 MG/ML IJ SOLN: 2.0000 mg | Freq: Four times a day (QID) | INTRAMUSCULAR | Status: DC | PRN

## 2022-11-12 MED ORDER — ONDANSETRON HCL 4 MG/2ML IJ SOLN
4.0000 mg | Freq: Four times a day (QID) | INTRAMUSCULAR | Status: DC | PRN
  Administered 2022-11-13: 4 mg via INTRAVENOUS
  Filled 2022-11-12: qty 2

## 2022-11-12 MED ORDER — DIPHENHYDRAMINE HCL 50 MG/ML IJ SOLN: 12.5000 mg | INTRAMUSCULAR | Status: DC | PRN

## 2022-11-12 MED ORDER — POTASSIUM CHLORIDE CRYS ER 20 MEQ PO TBCR
40.0000 meq | EXTENDED_RELEASE_TABLET | Freq: Once | ORAL | Status: AC
  Administered 2022-11-12: 40 meq via ORAL
  Filled 2022-11-12: qty 2

## 2022-11-12 MED ORDER — ACETAMINOPHEN 650 MG RE SUPP: 650.0000 mg | Freq: Four times a day (QID) | RECTAL | Status: DC | PRN

## 2022-11-12 MED ORDER — POLYVINYL ALCOHOL 1.4 % OP SOLN: 1.0000 [drp] | Freq: Four times a day (QID) | OPHTHALMIC | Status: DC | PRN

## 2022-11-12 MED ORDER — BIOTENE DRY MOUTH MT LIQD
15.0000 mL | Freq: Two times a day (BID) | OROMUCOSAL | Status: DC
  Administered 2022-11-12 – 2022-11-13 (×2): 15 mL via TOPICAL

## 2022-11-12 MED ORDER — ONDANSETRON 4 MG PO TBDP: 4.0000 mg | ORAL_TABLET | Freq: Four times a day (QID) | ORAL | Status: DC | PRN

## 2022-11-12 NOTE — Evaluation (Signed)
Occupational Therapy Evaluation Patient Details Name: Douglas Spencer MRN: [id number] DOB: October 23, 1945 Today's Date: 11/12/2022   History of Present Illness 77 y.o. male with who presented to the ED from home 11/10/22 with altered mental status. +sepsis due to large left hip wound; PMH: CVA with residual left-sided weakness, dysarthria, cachectic, bedbound status, history of seizures, anxiety, former substance use   Clinical Impression   Pt from home and his caregiver, [name], assists with all ADL tasks. Mr Voris states he stays in his regular bed all day. He states his plan is to DC back home with [name] as his caregiver. L palm guard provided - pt to wear at all times, recommend removing every shift for hand hygiene. Pt with MASD L anticubital fossa - feel he would benefit from use of Interdry in this area (pillow case placed in this area for temporary use for moisture wicking properties)- may benefit from use of Posey soft arm splint to help with contracture management and improve skin integrity, also recommend B Prevalon boots - discussed with Dr Thedore Mins and order placed. Recommend pt wear Prevalon boots at all times when in bed, removing as needed for hygiene. Pt has order for mattress replacement - nsg made aware of need for air mattress. If pt DC home, recommend hospital bed with air mattress overlay.Acute OT will follow at this time for contracture management.        If plan is discharge home, recommend the following: A lot of help with walking and/or transfers;A lot of help with bathing/dressing/bathroom;Assistance with feeding;Assistance with cooking/housework;Direct supervision/assist for medications management;Direct supervision/assist for financial management;Assist for transportation    Functional Status Assessment     Equipment Recommendations  Hospital bed (wiht air mattress overlay)    Recommendations for Other Services       Precautions / Restrictions Precautions Precautions:  Other (comment) (at risk for skin breakdown; contractures) Required Braces or Orthoses: Other Brace (fitted with L palm guard)      Mobility Bed Mobility Overal bed mobility: Needs Assistance Bed Mobility: Rolling Rolling: Max assist (toward Hmei side; total A for other mobility; states he stays in the bed)              Transfers                   General transfer comment: NA      Balance                                           ADL either performed or assessed with clinical judgement   ADL Overall ADL's : Needs assistance/impaired Eating/Feeding: Set up;Supervision/ safety Eating/Feeding Details (indicate cue type and reason): able to self feed - per ST note not interested in formal swallow evaluation Grooming: Moderate assistance Grooming Details (indicate cue type and reason): able to wash his face                     Toileting- Clothing Manipulation and Hygiene: Total assistance Toileting - Clothing Manipulation Details (indicate cue type and reason): incontinent       General ADL Comments: total A wtih all mobility - using hand rails to assist with moving in bed; overall Max to toal A with ADL tasks @ bed level. 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; non-functional; Able to place palm guard in hand   Lower Extremity Assessment Lower Extremity Assessment: Defer to PT evaluation (BLE contractures; pressure areas lateral aspect of LLE)   Cervical / Trunk  Assessment Cervical / Trunk Assessment: Kyphotic   Communication Communication Communication: No apparent difficulties   Cognition Arousal: Alert Behavior During Therapy: Flat affect Overall Cognitive Status: No family/caregiver present to determine baseline cognitive functioning                                 General Comments: slow processing; most likely close to baseline; discussing current events     General Comments       Exercises Exercises: Other exercises Other Exercises Other Exercises: PROM within pain tolerance LUE to place pillowcase in anticubital fossa and palmguard in hand   Shoulder Instructions      Home Living Family/patient expects to be discharged to:: Private residence Living Arrangements: Non-relatives/Friends Available Help at Discharge: Family;Available 24 hours/day Type of Home: House Home Access: Stairs to enter   Entrance Stairs-Rails: Left Home Layout: Two level Alternate Level Stairs-Number of Steps: 6 per pt Alternate Level Stairs-Rails: Left Bathroom Shower/Tub: Chief Strategy Officer: Standard Bathroom Accessibility: Yes How Accessible: Accessible via walker     Additional Comments: informaiton from chart      Prior Functioning/Environment Prior Level of Function : Needs assist             Mobility Comments: per pt he stays in the bed ADLs Comments: assist for ADL tasks form [name]        OT Problem List:        OT Treatment/Interventions:      OT Goals(Current goals can be found in the care plan section) Acute Rehab OT Goals Patient Stated Goal: home with [name] OT Goal Formulation: With patient Time For Goal Achievement: 11/26/22 Potential to Achieve Goals: Fair  OT Frequency: Min 1X/week    Co-evaluation              AM-PAC OT "6 Clicks" Daily Activity     Outcome Measure Help from another person eating meals?: A Little Help from another person taking care of personal grooming?:  A Lot Help from another person toileting, which includes using toliet, bedpan, or urinal?: Total Help from another person bathing (including washing, rinsing, drying)?: A Lot Help from another person to put on and taking off regular upper body clothing?: Total Help from another person to put on and taking off regular lower body clothing?: Total 6 Click Score: 10   End of Session Nurse Communication: Other (comment) (use of palm guard; rec B Prevalon boots; needs mattress replacement - order in order set)  Activity Tolerance: Patient tolerated treatment well Patient left: in bed;with call bell/phone within reach  OT Visit Diagnosis: Muscle weakness (generalized) (M62.81);Other symptoms and signs involving cognitive function;Hemiplegia and hemiparesis Hemiplegia - Right/Left: Left Hemiplegia - dominant/non-dominant: Non-Dominant 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

## 2022-11-12 NOTE — Progress Notes (Signed)
Speech Language Pathology Treatment: Dysphagia  Patient Details Name: Douglas Spencer MRN: [id number] DOB: June 09, 1945 Today's Date: 11/12/2022 Time: 0920-0930 SLP Time Calculation (min) (ACUTE ONLY): 10 min  Assessment / Plan / Recommendation Clinical Impression  Pt observed to be laying mostly flat, sipping water and coughing. SLP offered to reposition pt and further self fed sips were tolerated without coughing. Posted a sign requesting assist to reposition often. Pt reports he had eaten some jello mashed potatoes. Asked him if he would like some other foods that would need to be chewed. Pt reported he didn't want to have to chew his food. He again confirmed he wouldn't want any other changes to his diet and doesn't want to "go through all that swallow testing." SLP encouraged pt to request upright position and drink with a slow rate to avoid coughing if possible. He did verbalize awareness of risk of aspiration pna. Given pt wishes no f/u needed.   HPI HPI: Douglas Spencer is a 77 y.o. male admitted with sepsis. Pt with evident FTT, found with AMS.  Multifocal airspace opacities in the left lung are suspicious for infection. Currently living at home with a roommate who takes care of him. He is immobile and bedbound. He was confused for the past hour, EMS was called, and when they arrived he was retinae urine saturated blanket stuck to his wounds and had ants crawling all over his body.with medical history significant for history of CVA with residual left-sided weakness, dysarthria, cachectic, bedbound status, history of seizures, anxiety, former substance use. Pt has a history of dysphagia, Last MBS in 2023 completed in CIR after second CVA demonstrate sensed aspiration of thin liquids, prominent CP segment. Pt recommended to consume dys 2/nectar at that time.      SLP Plan  All goals met      Recommendations for follow up therapy are one component of a multi-disciplinary discharge planning  process, led by the attending physician.  Recommendations may be updated based on patient status, additional functional criteria and insurance authorization.    Recommendations  Diet recommendations: Dysphagia 1 (puree);Thin liquid Liquids provided via: Cup;Straw Medication Administration: Whole meds with puree Supervision: Patient able to self feed                  Oral care BID           All goals met     Claudine Mouton  11/12/2022, 9:57 AM

## 2022-11-12 NOTE — Plan of Care (Signed)
  Problem: Pain Managment: Goal: General experience of comfort will improve Outcome: Progressing   Problem: Safety: Goal: Ability to remain free from injury will improve Outcome: Progressing   

## 2022-11-12 NOTE — Progress Notes (Signed)
PT Cancellation Note  Patient Details Name: Douglas Spencer MRN: [id number] DOB: 08/06/1945   Cancelled Treatment:    Reason Eval/Treat Not Completed: PT screened, no needs identified, will sign off  Noted OT evaluation completed. Patient does not normally mobilize OOB. OT is addressing skin protection (palm-guard, bil Prevalon boots, air mattress). No further needs identified. PT is signing off.    Jerolyn Center, PT Acute Rehabilitation Services  Office [phone]  Zena Amos 11/12/2022, 4:03 PM

## 2022-11-12 NOTE — Progress Notes (Addendum)
PROGRESS NOTE                                                                                                                                                                                                             Patient Demographics:    Douglas Spencer, is a 77 y.o. male, DOB - [birth year], WFU:[id number]  Outpatient Primary MD for the patient is [name], MD    LOS - 2  Admit date - 11/09/2022    Chief Complaint  Patient presents with   Altered Mental Status   Failure To Thrive       Brief Narrative (HPI from H&P)   77 y.o. male with medical history significant for history of CVA with residual left-sided weakness, dysarthria, cachectic, bedbound status, history of seizures, anxiety, former substance use who presented to the ED from home with altered mental status.  Currently living at home with a roommate who takes care of him.  He is immobile and bedbound.  He was confused for the past hour, EMS was called, and when they arrived he was retinae urine saturated blanket stuck to his wounds and had ants crawling all over his body.  Reports poor appetite.  He was found to be septic in the ER with tachycardia, leukocytosis, elevated lactic acid and was placed on broad-spectrum antibiotics and admitted to the hospital, possible sources were aspiration pneumonia and left sided sacral decubitus ulcer which were present on admission.   Subjective:   Patient in bed, appears comfortable, denies any headache, no fever, no chest pain or pressure, no shortness of breath , no abdominal pain. No focal weakness.   Assessment  & Plan :   Severe sepsis due to large left hip wound lung with possible aspiration pneumonia-report noted, wound care team following, orthopedics Dr. Velda Shell consulted for debridement but patient wants to pursue medical treatment, if declines wants to pursue comfort measures.  Follow blood cultures, follow MRSA  nasal PCR, CT hip does not show any fluid collection or osteomyelitis, had superficial cellulitis with large decubitus ulcer for which she received IV antibiotics, sepsis pathophysiology has resolved, cultures negative thus far, transition to oral doxycycline to complete total of 2 weeks of treatment, continue to follow cultures and MRSA nasal PCR test results.  Note palliative care also on board if declines then full comfort measures.  History of CVA with left-sided weakness and contractures, bedbound - chronically ill with poor long-term prognosis.  Per report lives at home with a roommate who reportedly cares for him.  Arrived in urine soaked blanket and ants all over his body.  Supportive care mentation has improved, head CT acute, EEG unremarkable, stable TSH and ammonia levels, normal B12 being replaced, B1 levels pending.  Mentation has improved and now close to baseline.  B12 borderline low normal - placed on replacement.   Acute metabolic encephalopathy - the above.   Chronic hepatitis C-seen by ID as an outpatient, he completed treatment with Mavyret, with no detectable hep C RNA level.   Chronic diastolic CHF - most recent 2D echo 2022 showed normal LVEF 65 to 65%, grade 1 diastolic dysfunction.  Currently appears dry.   Mild macrocytic anemia - B12 low normal and being replaced.   Elevated isolated bilirubin - unclear significance, AST, ALT, alk phos normal. Monitor.   History of seizures - continue Vimpat.  EEG nonacute.  Mentation back to baseline.   Hypokalemia.  Replaced.    Dysphagia.  Speech on board.    Anxiety - continue Atarax as needed.   Right arm PICC line.      Condition - Extremely Guarded  Family Communication  : None present, caregiver [name] not reachable, cell phone does not seem to be working, message left on home phone number answering machine on 11/11/2022 at 10:30 AM  Code Status : Full code  Consults  : Orthopedics, palliative care  PUD  Prophylaxis :    Procedures  :     CT -  1. Possible mild decubitus ulceration over the left greater trochanter and left ischium without evidence of underlying focal fluid collection, foreign body or soft tissue emphysema. 2. No CT evidence of osteomyelitis or septic arthritis. 3. No acute osseous findings.      Disposition Plan  :    Status is: Inpatient   DVT Prophylaxis  :    heparin injection 5,000 Units Start: 11/10/22 0600   Lab Results  Component Value Date   PLT 206 11/12/2022    Diet :  Diet Order             DIET - DYS 1 Room service appropriate? Yes; Fluid consistency: Thin  Diet effective now                    Inpatient Medications  Scheduled Meds:  aspirin  81 mg Oral Daily   atorvastatin  40 mg Oral Daily   Chlorhexidine Gluconate Cloth  6 each Topical Daily   cyanocobalamin  1,000 mcg Subcutaneous Daily   [START ON 11/14/2022] vitamin B-12  1,000 mcg Oral Daily   heparin  5,000 Units Subcutaneous Q8H   nutrition supplement (JUVEN)  1 packet Oral BID BM   sodium chloride flush  3 mL Intravenous Q12H   sodium hypochlorite   Irrigation BID   thiamine (VITAMIN B1) injection  100 mg Intravenous Daily   Continuous Infusions:  ceFEPime (MAXIPIME) IV 2 g (11/12/22 0946)   folic acid 5 mg in sodium chloride 0.9 % 50 mL IVPB Stopped (11/11/22 1223)   lacosamide (VIMPAT) IV 50 mg (11/11/22 2221)   metronidazole 500 mg (11/12/22 0006)   potassium chloride 10 mEq (11/12/22 0937)   vancomycin 500 mg (11/12/22 0135)   PRN Meds:.hydrOXYzine, mouth rinse, sodium chloride flush     Objective:   Vitals:   11/11/22 2112 11/11/22 2338 11/12/22  0406 11/12/22 0720  BP: 107/74 120/80 106/65 105/71  Pulse: 91 92 88 87  Resp: 19 (!) 21 19 18   Temp: 97.8 F (36.6 C) 98.4 F (36.9 C) 97.6 F (36.4 C) 97.6 F (36.4 C)  TempSrc: Oral Oral Oral Oral  SpO2: 97% 97% 98% 95%  Weight:      Height:        Wt Readings from Last 3 Encounters:  11/10/22 44.8  kg  02/21/22 67.1 kg  02/12/22 67.2 kg     Intake/Output Summary (Last 24 hours) at 11/12/2022 1004 Last data filed at 11/12/2022 1610 Gross per 24 hour  Intake 1298.31 ml  Output --  Net 1298.31 ml     Physical Exam  Elderly cachectic white male, awake alert, chronic left-sided hemiparesis and contractures, see decubitus ulcer description below present on admission has a right arm PICC line [email or web address],PERRAL Supple Neck, No JVD,   Symmetrical Chest wall movement, Good air movement bilaterally, CTAB RRR,No Gallops,Rubs or new Murmurs,  +ve B.Sounds, Abd Soft, No tenderness,   Kindly see decubitus ulcer below    RN pressure injury documentation: Pressure Injury 11/10/22 Hip Right Stage 2 -  Partial thickness loss of dermis presenting as a shallow open injury with a red, pink wound bed without slough. (Active)  11/10/22 1500  Location: Hip  Location Orientation: Right  Staging: Stage 2 -  Partial thickness loss of dermis presenting as a shallow open injury with a red, pink wound bed without slough.  Wound Description (Comments):   Present on Admission: Yes  Dressing Type Foam - Lift dressing to assess site every shift 11/12/22 0100     Pressure Injury 11/10/22 Hip Left Unstageable - Full thickness tissue loss in which the base of the injury is covered by slough (yellow, tan, gray, green or brown) and/or eschar (tan, brown or black) in the wound bed. (Active)  11/10/22 1500  Location: Hip  Location Orientation: Left  Staging: Unstageable - Full thickness tissue loss in which the base of the injury is covered by slough (yellow, tan, gray, green or brown) and/or eschar (tan, brown or black) in the wound bed.  Wound Description (Comments):   Present on Admission: Yes  Dressing Type Foam - Lift dressing to assess site every shift;Gauze (Comment) 11/12/22 0100      Data Review:    Recent Labs  Lab 11/09/22 2108 11/10/22 0340 11/11/22 0419 11/12/22 0329  WBC 19.1* 12.3* 7.5 9.1   HGB 13.8 12.2* 9.8* 9.8*  HCT 43.0 37.8* 30.0* 30.0*  PLT 376 257 183 206  MCV 79.6* 78.9* 79.8* 80.0  MCH 25.6* 25.5* 26.1 26.1  MCHC 32.1 32.3 32.7 32.7  RDW 15.0 14.8 14.9 14.7  LYMPHSABS 1.3  --   --  1.2  MONOABS 1.1*  --   --  0.4  EOSABS 0.1  --   --  0.1  BASOSABS 0.1  --   --  0.0    Recent Labs  Lab 11/09/22 2108 11/09/22 2124 11/10/22 0029 11/10/22 0340 11/11/22 0419 11/11/22 1158 11/11/22 1903 11/12/22 0329  NA 138  --   --  136 138  --   --  134*  K 3.5  --   --  3.8 2.8*  --   --  2.9*  CL 99  --   --  103 107  --   --  107  CO2 23  --   --  24 20*  --   --  21*  ANIONGAP 16*  --   --  9 11  --   --  6  GLUCOSE 123*  --   --  105* 96  --   --  92  BUN 30*  --   --  25* 18  --   --  13  CREATININE 1.25*  --   --  0.94 0.63  --   --  0.67  AST 18  --   --  15 14*  --   --   --   ALT 11  --   --  11 9  --   --   --   ALKPHOS 62  --   --  56 47  --   --   --   BILITOT 1.4*  --   --  1.5* 0.7  --   --   --   ALBUMIN 2.8*  --   --  2.4* 2.0*  --   --   --   CRP  --   --   --   --  14.2*  --   --  8.1*  PROCALCITON  --   --   --  0.65 0.56  --   --  0.30  LATICACIDVEN  --  6.2* 4.7* 2.7*  --   --   --   --   INR 1.1  --   --   --   --   --   --   --   TSH  --   --   --   --   --  1.480  --   --   AMMONIA  --   --   --   --   --   --  18  --   MG  --   --   --   --  1.8  --   --  1.9  CALCIUM 9.1  --   --  8.5* 8.0*  --   --  7.5*      Recent Labs  Lab 11/09/22 2108 11/09/22 2124 11/10/22 0029 11/10/22 0340 11/11/22 0419 11/11/22 1158 11/11/22 1903 11/12/22 0329  CRP  --   --   --   --  14.2*  --   --  8.1*  PROCALCITON  --   --   --  0.65 0.56  --   --  0.30  LATICACIDVEN  --  6.2* 4.7* 2.7*  --   --   --   --   INR 1.1  --   --   --   --   --   --   --   TSH  --   --   --   --   --  1.480  --   --   AMMONIA  --   --   --   --   --   --  18  --   MG  --   --   --   --  1.8  --   --  1.9  CALCIUM 9.1  --   --  8.5* 8.0*  --   --  7.5*     --------------------------------------------------------------------------------------------------------------- Lab Results  Component Value Date   CHOL 109 01/29/2022   HDL 30.60 (L) 01/29/2022   LDLCALC 49 01/29/2022   TRIG 148.0 01/29/2022   CHOLHDL 4 01/29/2022    Lab Results  Component Value Date   HGBA1C 5.3 01/31/2021   Recent Labs  11/11/22 1158  TSH 1.480   Recent Labs    11/10/22 0639 11/11/22 1158  VITAMINB12 295  --   FOLATE 4.1* 36.8  FERRITIN 514*  --   TIBC 146*  --   IRON 13*  --     Radiology Reports CT HEAD WO CONTRAST ( )  Result Date: 11/11/2022 CLINICAL DATA:  77 year old male with shortness of breath. Sepsis. Decubitus wounds. "Stroke follow-up". EXAM: CT HEAD WITHOUT CONTRAST TECHNIQUE: Contiguous axial images were obtained from the base of the skull through the vertex without intravenous contrast. RADIATION DOSE REDUCTION: This exam was performed according to the departmental dose-optimization program which includes automated exposure control, adjustment of the mA and/or kV according to patient size and/or use of iterative reconstruction technique. COMPARISON:  Brain MRI 02/01/2021 and earlier. FINDINGS: Brain: Expected evolution of the right corona radiata and basal ganglia lacunar infarct seen in 2022. Chronic encephalomalacia there now. Associated right brainstem Wallerian degeneration. Chronic left cerebellum PICA territory infarct redemonstrated. Small and circumscribed hyperdense foci in the right periventricular white matter on coronal images 39 and 40 more resemble dystrophic calcification than acute hemorrhage, and are along the margin of the chronic corona radiata infarct there. Patchy additional bilateral cerebral white matter hypodensity. No convincing acute intracranial hemorrhage. No midline shift, acute ventriculomegaly, mass effect, evidence of mass lesion, or evidence of cortically based acute infarction. Vascular: No suspicious  intracranial vascular hyperdensity. Skull: No acute osseous abnormality identified. Sinuses/Orbits: Visualized paranasal sinuses and mastoids are clear. Other: Visualized orbits and scalp soft tissues are within normal limits. IMPRESSION: 1. Sequelae of 2022 right corona radiata and brainstem lacunar infarct with Wallerian degeneration. Curvilinear hyperdensity along the anterior margin of that area is more consistent with dystrophic calcification or chronic blood products than acute intracranial hemorrhage (sagittal image 25). 2. No convincing acute intracranial abnormality. 3. Other chronic small vessel disease, chronic Left PICA cerebellar infarct. Electronically Signed   By: Odessa Fleming M.D.   On: 11/11/2022 13:33   [place] EKG SITE RITE  Result Date: 11/11/2022 If Site Rite image not attached, placement could not be confirmed due to current cardiac rhythm.  EEG adult  Result Date: 11/11/2022 Charlsie Quest, MD     11/11/2022  1:54 PM Patient Name: Brendan Gadson MRN: [id number] Epilepsy Attending: Charlsie Quest Referring Physician/Provider: Leroy Sea, MD Date: 11/11/2022 Duration: 22.28 mins Patient history: 77yo M with ams getting eeg to evaluate for seizure Level of alertness: Awake AEDs during EEG study: None Technical aspects: This EEG study was done with scalp electrodes positioned according to the 10-20 International system of electrode placement. Electrical activity was reviewed with band pass filter of 1-70Hz , sensitivity of 7 uV/mm, display speed of 74mm/sec with a 60Hz  notched filter applied as appropriate. EEG data were recorded continuously and digitally stored.  Video monitoring was available and reviewed as appropriate. Description: The posterior dominant rhythm consists of 7 Hz activity of moderate voltage (25-35 uV) seen predominantly in posterior head regions, symmetric and reactive to eye opening and eye closing. EEG showed continuous generalized 5 to 7 Hz theta slowing.  Hyperventilation and photic stimulation were not performed.   ABNORMALITY - Continuous slow, generalized IMPRESSION: This study is suggestive of mild to moderate diffuse encephalopathy. No seizures or epileptiform discharges were seen throughout the recording. Charlsie Quest   DG Chest Port 1 View  Result Date: 11/11/2022 CLINICAL DATA:  Shortness of breath EXAM: PORTABLE CHEST 1 VIEW COMPARISON:  CXR 09/01/17 FINDINGS: No pleural effusion. No  pneumothorax. Normal cardiac and mediastinal contours. There are multilevel opacity airspace opacities in the left lung, suspicious for infection. No radiographically apparent displaced rib fractures. Visualized upper abdomen is unremarkable. IMPRESSION: Multifocal airspace opacities in the left lung are suspicious for infection. Electronically Signed   By: Lorenza Cambridge M.D.   On: 11/11/2022 08:14   CT HIP LEFT W CONTRAST  Result Date: 11/10/2022 CLINICAL DATA:  Sepsis with leukocytosis and multiple wounds. History of CVA with left-sided weakness. Patient is bed bound. Concern for osteomyelitis. EXAM: CT OF THE LOWER LEFT EXTREMITY WITH CONTRAST TECHNIQUE: Multidetector CT imaging of the left hip was performed according to the standard protocol following intravenous contrast administration. RADIATION DOSE REDUCTION: This exam was performed according to the departmental dose-optimization program which includes automated exposure control, adjustment of the mA and/or kV according to patient size and/or use of iterative reconstruction technique. CONTRAST:  75mL OMNIPAQUE IOHEXOL 350 MG/ML SOLN COMPARISON:  Radiographs 11/09/2022.  None additional. FINDINGS: Bones/Joint/Cartilage The patient's left hip is flexed approximately 90 degrees. There is no evidence of acute fracture, dislocation or femoral head osteonecrosis. The bones are demineralized. Mild degenerative changes at both hips without evidence of large hip joint effusion. No cortical destruction identified to  suggest osteomyelitis. Ligaments Suboptimally assessed by CT. Muscles and Tendons Mild generalized muscular atrophy. No focal intramuscular fluid collection or abnormal enhancement identified. Soft tissues Possible mild decubitus ulceration over the left greater trochanter and left ischium without evidence of underlying focal fluid collection, foreign body or soft tissue emphysema. No significant underlying subcutaneous inflammatory changes are identified. There is a moderate amount of stool within the rectum. Mild iliofemoral atherosclerosis noted. IMPRESSION: 1. Possible mild decubitus ulceration over the left greater trochanter and left ischium without evidence of underlying focal fluid collection, foreign body or soft tissue emphysema. 2. No CT evidence of osteomyelitis or septic arthritis. 3. No acute osseous findings. Electronically Signed   By: Carey Bullocks M.D.   On: 11/10/2022 09:32   DG Hip Unilat W or Wo Pelvis 2-3 Views Left  Result Date: 11/09/2022 CLINICAL DATA:  Left hip wound. EXAM: DG HIP (WITH OR WITHOUT PELVIS) 2-3V LEFT COMPARISON:  None Available. FINDINGS: Evaluation is limited due to patient's positioning. No obvious fracture or dislocation. The bones are osteopenic. The soft tissues are grossly unremarkable. IMPRESSION: 1. No obvious fracture or dislocation. 2. Osteopenia. Electronically Signed   By: Elgie Collard M.D.   On: 11/09/2022 23:13   DG Chest Port 1 View  Result Date: 11/09/2022 CLINICAL DATA:  Question of sepsis.  Failure to thrive. EXAM: PORTABLE CHEST 1 VIEW COMPARISON:  09/01/2017 FINDINGS: Patient positioning limits evaluation. Heart size and pulmonary vascularity are normal. Emphysematous changes and scattered fibrosis in the lungs. No focal consolidation or airspace disease is identified. No pleural effusions. No pneumothorax. Mediastinal contours appear intact. IMPRESSION: Emphysematous changes and scattered fibrosis in the lungs. No focal consolidation.  Electronically Signed   By: Burman Nieves M.D.   On: 11/09/2022 21:41      Signature  -   Susa Raring M.D on 11/12/2022 at 10:04 AM   -  To page go to [email or web address]

## 2022-11-12 NOTE — Progress Notes (Signed)
Daily Progress Note   Patient Name: Douglas Spencer       Date: 11/12/2022 DOB: 01-Apr-1945  Age: 77 y.o. MRN#: [id number] Attending Physician: Leroy Sea, MD Primary Care Physician: [name], MD Admit Date: 11/09/2022  Reason for Consultation/Follow-up: Establishing goals of care  Subjective:  2:08 PM Received notification [name] called PMT phone requesting return call.   Returned call. Therapeutic listening provided as [name] continues to explain patient's poor living conditions, including raccoons entering/exiting the home as well as bedbugs/insects and no power or water. She was able to speak with patient's friend/[name] yesterday and he was "very angry" patient was considering hospice care. Per [place] Police told her "I will not look after a vile wound." [name] tells me how they got DSS involved in patient's situation recently. [name] does not want patient to return to his home under such poor conditions. Explained that pending Siddiq's final decisions today, if he continued to wish for comfort/hospice, we could explore a hospice facility or LTC with hospice in context of him having Medicare and Medicaid insurance. [name] expressed appreciation for phone call and discussion. Meeting still planned for 3pm today.  3:00 - 4:00 PM I have reviewed medical records including EPIC notes, MAR, and labs. Received report from primary RN - no acute concerns.  Went to patient's bedside for scheduled follow up meeting - cousin/[name] and her husband/[name], brother/[name], niece/Ginger present. Patient was lying in bed awake, alert, oriented, and able to participate in conversation. No signs or non-verbal gestures of pain or discomfort noted. No respiratory distress, increased work of breathing, or  secretions noted. He remains weak and frail appearing.  Patient remembers I am with the PMT. Emotional support provided to patient and family. Allowed space and time for patient to review information he remembers from yesterday - he remembers option for surgical intervention vs hospice care. Provided more detailed information per family request that were not present yesterday. Patient seemed to struggle in this meeting making a decision.   Reviewed anticipate hospital course and recovery trajectory if he opted for surgical intervention.   We talked about transition to comfort measures in house and what that would entail inclusive of medications to control pain, dyspnea, agitation, nausea, and itching. We discussed stopping all unnecessary measures such as blood draws, needle sticks, oxygen, antibiotics, CBGs/insulin, cardiac  monitoring, IVF, and frequent vital signs. Education provided that other non-pharmacological interventions would be utilized for holistic support and comfort such as spiritual support if requested, repositioning, music therapy, offering comfort feeds, and/or therapeutic listening. All care would focus on how the patient is looking and feeling.   Provided education and counseling at length on the philosophy and benefits of hospice care. Discussed that it offers a holistic approach to care in the setting of end-stage illness, and is about supporting the patient where they are allowing nature to take it's course. Discussed the hospice team includes RNs, physicians, social workers, and chaplains. They can provide personal care, support for the family, and help keep patient out of the hospital.  Allowed space and time for patient and family to discuss information and thoughts among themselves. All family present are supportive of comfort/hospice care.  Patient opts for transition to full comfort care today and does not wish to pursue surgical interventions. Patient and family would like  to continue antibiotics at least until his discharge.  They are hopeful for his transfer to a hospice facility - requesting [facility] at either their [place] or [place] location.   Patient expresses joy that he can eat/drink as desires under full comfort - he asked for vanilla ice cream. Vanilla ice cream provided.  All questions and concerns addressed. Encouraged to call with questions and/or concerns. PMT card provided.  Length of Stay: 2  Current Medications: Scheduled Meds:   aspirin  81 mg Oral Daily   atorvastatin  40 mg Oral Daily   Chlorhexidine Gluconate Cloth  6 each Topical Daily   cyanocobalamin  1,000 mcg Subcutaneous Daily   [START ON 11/14/2022] vitamin B-12  1,000 mcg Oral Daily   doxycycline  100 mg Oral Q12H   heparin  5,000 Units Subcutaneous Q8H   nutrition supplement (JUVEN)  1 packet Oral BID BM   sodium chloride flush  3 mL Intravenous Q12H   sodium hypochlorite   Irrigation BID   thiamine (VITAMIN B1) injection  100 mg Intravenous Daily    Continuous Infusions:  folic acid 5 mg in sodium chloride 0.9 % 50 mL IVPB 5 mg (11/12/22 1029)   lacosamide (VIMPAT) IV 50 mg (11/12/22 1123)   potassium chloride 10 mEq (11/12/22 1333)    PRN Meds: hydrOXYzine, mouth rinse, sodium chloride flush  Physical Exam Vitals and nursing note reviewed.  Constitutional:      General: He is not in acute distress.    Appearance: He is cachectic. He is ill-appearing.  Pulmonary:     Effort: No respiratory distress.  Skin:    General: Skin is warm and dry.  Neurological:     Mental Status: He is alert and oriented to person, place, and time.     Motor: Weakness present.  Psychiatric:        Attention and Perception: Attention normal.        Behavior: Behavior is cooperative.        Cognition and Memory: Cognition and memory normal.             Vital Signs: BP 112/65 (BP Location: Left Wrist)   Pulse 91   Temp 97.6 F (36.4 C) (Oral)   Resp 19   Ht 5\' 8"   (1.727 m)   Wt 44.8 kg   SpO2 97%   BMI 15.02 kg/m  SpO2: SpO2: 97 % O2 Device: O2 Device: Room Air O2 Flow Rate: O2 Flow Rate (L/min): 2 L/min  Intake/output summary:  Intake/Output Summary (Last 24 hours) at 11/12/2022 1406 Last data filed at 11/12/2022 1100 Gross per 24 hour  Intake 1295.31 ml  Output 550 ml  Net 745.31 ml   LBM: Last BM Date : 11/11/22 Baseline Weight: Weight: 43.7 kg Most recent weight: Weight: 44.8 kg       Palliative Assessment/Data: PPS 20%      Patient Active Problem List   Diagnosis Date Noted   Severe sepsis with lactic acidosis (HCC) 11/10/2022   Complicated open wound of hip, left, initial encounter 11/10/2022   History of CVA with residual deficit 11/10/2022   History of seizure disorder 11/10/2022   Anxiety 11/10/2022   Bedbound 11/10/2022   Dyslipidemia, goal LDL below 70 01/29/2022   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 male  with past medical history of CVA with residual left-sided weakness, dysarthria, cachectic, bedbound status, seizures, anxiety, former substance use was admitted on 11/09/2022 from home with severe sepsis secondary to large left hip wound.   Assessment: Principal Problem:   Severe sepsis with lactic acidosis (HCC) Active Problems:   Complicated open wound of hip, left, initial encounter   History of CVA with residual deficit   History of seizure disorder   Anxiety   Bedbound   Terminal care  Recommendations/Plan: Initiated full comfort measures Continue DNR/DNI as previously documented - durable DNR form completed and placed in shadow chart. Copy was made and  will be scanned into Vynca/ACP tab Patient/family requesting [facility] hospice facility at either [facility] or [place] location - [facility] consult placed; TOC and hospice liaison notified Added orders for EOL symptom management and to reflect full comfort measures, as well as discontinued orders that were not focused on comfort Unrestricted visitation orders were placed per current [place] [facility] visitation policy  Continue palliative wound care Nursing to provide frequent assessments and administer PRN medications as clinically necessary to ensure EOL comfort PMT will continue to follow and support holistically  Symptom Management Morphine PRN severe pain/dyspnea/increased work of breathing/RR>25 Oxycodone PRN moderate pain Tylenol PRN pain/fever Biotin twice daily Benadryl PRN itching Robinul PRN secretions Haldol PRN agitation/delirium Ativan PRN anxiety/seizure/sleep/distress Zofran PRN nausea/vomiting Liquifilm Tears PRN dry eye Continue antibiotics until discharge   Goals of Care and Additional Recommendations: Limitations on Scope of Treatment: Full Comfort Care  Code Status:    Code Status Orders  (From admission, onward)           Start     Ordered   11/11/22 1440  Do not attempt resuscitation (DNR)- Limited -Do Not Intubate (DNI)  (Code Status)  Continuous       Question Answer Comment  If pulseless and not breathing No CPR or chest compressions.   In Pre-Arrest Conditions (Patient Is Breathing and Has A Pulse) Do not intubate. Provide all appropriate non-invasive medical interventions. Avoid ICU transfer unless indicated or required.   Consent: Discussion documented in EHR or advanced directives reviewed      11/11/22 1439           Code Status History     Date Active Date Inactive Code Status Order ID Comments User Context   11/10/2022 0049 11/11/2022 1439 Full Code  [id number]  Charlsie Quest, MD ED   02/06/2021 1711 02/21/2021 1707 Full Code [id number]   Lynnae Prude Inpatient   01/30/2021 2212 02/06/2021 1656 Full Code [id number]  Erick Blinks, MD ED       Prognosis:  Poor  Discharge Planning: Hospice facility  Care plan was discussed with primary RN, patient, patient's family, Dr. Thedore Mins, hospice liaison, [facility]  Thank you for allowing the Palliative Medicine Team to assist in the care of this patient.   Total Time 90 minutes Prolonged Time Billed  yes       Greater than 50%  of this time was spent counseling and coordinating care related to the above assessment and plan.  Haskel Khan, NP  Please contact Palliative Medicine Team phone at [phone] for questions and concerns.   *Portions of this note are a verbal dictation therefore any spelling and/or grammatical errors are due to the "[employer] One" system interpretation.

## 2022-11-12 NOTE — TOC Progression Note (Signed)
Transition of Care [facility]) - Progression Note    Patient Details  Name: Douglas Spencer MRN: [id number] Date of Birth: 19-Jun-1945  Transition of Care [facility]) CM/SW Contact  Mearl Latin, LCSW Phone Number: 11/12/2022, 4:46 PM  Clinical Narrative:    CSW received request for Hospice facility referral with family preference for [facility] or [place]. CSW sent referral to [facility] for review.    Expected Discharge Plan: Hospice Medical Facility Barriers to Discharge: Continued Medical Work up  Expected Discharge Plan and Services In-house Referral: Clinical Social Work, Hospice / Palliative Care   Post Acute Care Choice: Hospice Living arrangements for the past 2 months: Single Family Home                                       Social Determinants of Health (SDOH) Interventions SDOH Screenings   Food Insecurity: Patient Unable To Answer (11/10/2022)  Housing: Patient Unable To Answer (11/10/2022)  Transportation Needs: Patient Unable To Answer (11/10/2022)  Utilities: Patient Unable To Answer (11/10/2022)  Alcohol Screen: Low Risk  (02/21/2022)  Depression (PHQ2-9): Low Risk  (02/21/2022)  Financial Resource Strain: Low Risk  (02/21/2022)  Physical Activity: Inactive (02/21/2022)  Social Connections: Unknown (02/21/2022)  Stress: No Stress Concern Present (02/21/2022)  Tobacco Use: Medium Risk (02/21/2022)    Readmission Risk Interventions     No data to display

## 2022-11-12 NOTE — Plan of Care (Signed)
  Problem: Fluid Volume: Goal: Hemodynamic stability will improve Outcome: Progressing   Problem: Clinical Measurements: Goal: Diagnostic test results will improve Outcome: Progressing   Problem: Respiratory: Goal: Ability to maintain adequate ventilation will improve Outcome: Progressing   Problem: Clinical Measurements: Goal: Respiratory complications will improve Outcome: Progressing Goal: Cardiovascular complication will be avoided Outcome: Progressing   Problem: Elimination: Goal: Will not experience complications related to bowel motility Outcome: Progressing   Problem: Skin Integrity: Goal: Risk for impaired skin integrity will decrease Outcome: Progressing

## 2022-11-13 DIAGNOSIS — A419 Sepsis, unspecified organism: Secondary | ICD-10-CM | POA: Diagnosis not present

## 2022-11-13 DIAGNOSIS — Z515 Encounter for palliative care: Secondary | ICD-10-CM | POA: Diagnosis not present

## 2022-11-13 DIAGNOSIS — Z789 Other specified health status: Secondary | ICD-10-CM | POA: Diagnosis not present

## 2022-11-13 DIAGNOSIS — E872 Acidosis, unspecified: Secondary | ICD-10-CM | POA: Diagnosis not present

## 2022-11-13 DIAGNOSIS — Z66 Do not resuscitate: Secondary | ICD-10-CM | POA: Diagnosis not present

## 2022-11-13 DIAGNOSIS — R6520 Severe sepsis without septic shock: Secondary | ICD-10-CM | POA: Diagnosis not present

## 2022-11-13 LAB — GLUCOSE, CAPILLARY: Glucose-Capillary: 65 mg/dL — ABNORMAL LOW (ref 70–99)

## 2022-11-13 MED ORDER — HALOPERIDOL 2 MG PO TABS: 2.0000 mg | ORAL_TABLET | Freq: Four times a day (QID) | ORAL | Status: AC | PRN

## 2022-11-13 MED ORDER — ONDANSETRON 4 MG PO TBDP: 4.0000 mg | ORAL_TABLET | Freq: Four times a day (QID) | ORAL | Status: AC | PRN

## 2022-11-13 MED ORDER — LORAZEPAM 1 MG PO TABS: 1.0000 mg | ORAL_TABLET | ORAL | Status: AC | PRN

## 2022-11-13 MED ORDER — DOXYCYCLINE HYCLATE 100 MG PO TABS: 100.0000 mg | ORAL_TABLET | Freq: Two times a day (BID) | ORAL | Status: AC

## 2022-11-13 MED ORDER — GLYCOPYRROLATE 1 MG PO TABS: 1.0000 mg | ORAL_TABLET | ORAL | Status: AC | PRN

## 2022-11-13 MED ORDER — MORPHINE SULFATE (PF) 2 MG/ML IV SOLN: 2.0000 mg | INTRAVENOUS | Status: AC | PRN

## 2022-11-13 MED ORDER — ACETAMINOPHEN 325 MG PO TABS: 650.0000 mg | ORAL_TABLET | Freq: Four times a day (QID) | ORAL | Status: AC | PRN

## 2022-11-13 MED ORDER — SODIUM CHLORIDE 0.9 % IV SOLN: 50.0000 mg | Freq: Two times a day (BID) | INTRAVENOUS | Status: AC

## 2022-11-13 MED ORDER — BIOTENE DRY MOUTH MT LIQD: 15.0000 mL | Freq: Two times a day (BID) | OROMUCOSAL | Status: AC

## 2022-11-13 MED ORDER — POLYVINYL ALCOHOL 1.4 % OP SOLN: 1.0000 [drp] | Freq: Four times a day (QID) | OPHTHALMIC | 0 refills | Status: AC | PRN

## 2022-11-13 MED ORDER — HALOPERIDOL LACTATE 2 MG/ML PO CONC: 2.0000 mg | Freq: Four times a day (QID) | ORAL | Status: AC | PRN

## 2022-11-13 MED ORDER — OXYCODONE HCL 5 MG PO TABS: 5.0000 mg | ORAL_TABLET | ORAL | Status: AC | PRN

## 2022-11-13 NOTE — Discharge Summary (Signed)
Physician Discharge Summary  Douglas Spencer ZOX:[id number] DOB: 03/23/1945 DOA: 11/09/2022  PCP: Douglas Grandchild, MD  Admit date: 11/09/2022 Discharge date: 11/13/2022   Management per residential hospice. Patient currently on regular diet, feeding for comfort.  Brief/Interim Summary:  77 y.o. male with medical history significant for history of CVA with residual left-sided weakness, dysarthria, cachectic, bedbound status, history of seizures, anxiety, former substance use who presented to the ED from home with altered mental status.  Currently living at home with a roommate who takes care of him.  He is immobile and bedbound.  He was confused for the past hour, EMS was called, and when they arrived he was retinae urine saturated blanket stuck to his wounds and had ants crawling all over his body.  Reports poor appetite.  He was found to be septic in the ER with tachycardia, leukocytosis, elevated lactic acid and was placed on broad-spectrum antibiotics and admitted to the hospital, possible sources were aspiration pneumonia and left sided sacral decubitus ulcer which were present on admission.  It was followed closely by palliative medicine, patient was transitioned to comfort.    Severe sepsis due to large left hip wound lung with possible aspiration pneumonia -report noted, wound care team following, orthopedics Dr. Velda Shell consulted for debridement but patient wants to pursue medical treatment, if declines wants to pursue comfort measures.  -Medicine input greatly appreciated, currently patient full comfort measures.    History of CVA with left-sided weakness and contractures, bedbound - chronically ill with poor long-term prognosis.  Per report lives at home with a roommate who reportedly cares for him.  Arrived in urine soaked blanket and ants all over his body.  Supportive care mentation has improved, head CT acute, EEG unremarkable, stable TSH and ammonia levels, normal B12 being replaced      B12 borderline low normal - placed on replacement.   Acute metabolic encephalopathy - the above.   Chronic hepatitis C-seen by ID as an outpatient, he completed treatment with Mavyret, with no detectable hep C RNA level.   Chronic diastolic CHF - most recent 2D echo 2022 showed normal LVEF 65 to 65%, grade 1 diastolic dysfunction.  Currently appears dry.   Mild macrocytic anemia - B12 low normal and being replaced.   Elevated isolated bilirubin - unclear significance, AST, ALT, alk phos normal. Monitor.   History of seizures - continue Vimpat.  EEG nonacute.  Mentation back to baseline.   Hypokalemia.  Replaced.     Dysphagia.       Anxiety -continue with as needed meds   Right arm PICC line.   Goals of care: -Palliative medicine input greatly appreciated, at this point patient is full comfort measures, plan for residential hospice placement     Discharge Diagnoses:  Principal Problem:   Severe sepsis with lactic acidosis (HCC) Active Problems:   Complicated open wound of hip, left, initial encounter   History of CVA with residual deficit   History of seizure disorder   Anxiety   Bedbound    Discharge Instructions  Discharge Instructions     Diet - low sodium heart healthy   Complete by: As directed    Discharge instructions   Complete by: As directed    Management per residential hospice.   Increase activity slowly   Complete by: As directed    No wound care   Complete by: As directed       Allergies as of 11/13/2022   No Known Allergies  Medication List     STOP taking these medications    aspirin 81 MG chewable tablet   atorvastatin 40 MG tablet Commonly known as: LIPITOR   hydrOXYzine 25 MG tablet Commonly known as: ATARAX   lacosamide 50 MG Tabs tablet Commonly known as: VIMPAT       TAKE these medications    acetaminophen 325 MG tablet Commonly known as: TYLENOL Take 2 tablets (650 mg total) by mouth every 6 (six) hours  as needed for mild pain (or Fever >/= 101).   antiseptic oral rinse Liqd Apply 15 mLs topically 2 (two) times daily.   doxycycline 100 MG tablet Commonly known as: VIBRA-TABS Take 1 tablet (100 mg total) by mouth every 12 (twelve) hours.   glycopyrrolate 1 MG tablet Commonly known as: ROBINUL Take 1 tablet (1 mg total) by mouth every 4 (four) hours as needed (excessive secretions).   haloperidol 2 MG tablet Commonly known as: HALDOL Take 1 tablet (2 mg total) by mouth every 6 (six) hours as needed for agitation (or delirium).   haloperidol 2 MG/ML solution Commonly known as: HALDOL Place 1 mL (2 mg total) under the tongue every 6 (six) hours as needed for agitation (or delirium).   lacosamide 50 mg in sodium chloride 0.9 % 25 mL Inject 50 mg into the vein every 12 (twelve) hours.   LORazepam 1 MG tablet Commonly known as: ATIVAN Take 1 tablet (1 mg total) by mouth every hour as needed for anxiety, seizure or sleep (distress).   morphine (PF) 2 MG/ML injection Inject 1 mL (2 mg total) into the vein every 2 (two) hours as needed (increased work of breathing, RR >25, distress).   ondansetron 4 MG disintegrating tablet Commonly known as: ZOFRAN-ODT Take 1 tablet (4 mg total) by mouth every 6 (six) hours as needed for nausea.   oxyCODONE 5 MG immediate release tablet Commonly known as: Oxy IR/ROXICODONE Take 1 tablet (5 mg total) by mouth every 4 (four) hours as needed for moderate pain.   polyvinyl alcohol 1.4 % ophthalmic solution Commonly known as: LIQUIFILM TEARS Place 1 drop into both eyes 4 (four) times daily as needed for dry eyes.        No Known Allergies  Consultations: Orthopedic, palliative medicine   Procedures/Studies: CT HEAD WO CONTRAST ( )  Result Date: 11/11/2022 CLINICAL DATA:  77 year old male with shortness of breath. Sepsis. Decubitus wounds. "Stroke follow-up". EXAM: CT HEAD WITHOUT CONTRAST TECHNIQUE: Contiguous axial images were obtained  from the base of the skull through the vertex without intravenous contrast. RADIATION DOSE REDUCTION: This exam was performed according to the departmental dose-optimization program which includes automated exposure control, adjustment of the mA and/or kV according to patient size and/or use of iterative reconstruction technique. COMPARISON:  Brain MRI 02/01/2021 and earlier. FINDINGS: Brain: Expected evolution of the right corona radiata and basal ganglia lacunar infarct seen in 2022. Chronic encephalomalacia there now. Associated right brainstem Wallerian degeneration. Chronic left cerebellum PICA territory infarct redemonstrated. Small and circumscribed hyperdense foci in the right periventricular white matter on coronal images 39 and 40 more resemble dystrophic calcification than acute hemorrhage, and are along the margin of the chronic corona radiata infarct there. Patchy additional bilateral cerebral white matter hypodensity. No convincing acute intracranial hemorrhage. No midline shift, acute ventriculomegaly, mass effect, evidence of mass lesion, or evidence of cortically based acute infarction. Vascular: No suspicious intracranial vascular hyperdensity. Skull: No acute osseous abnormality identified. Sinuses/Orbits: Visualized paranasal sinuses and mastoids are clear. Other:  Visualized orbits and scalp soft tissues are within normal limits. IMPRESSION: 1. Sequelae of 2022 right corona radiata and brainstem lacunar infarct with Wallerian degeneration. Curvilinear hyperdensity along the anterior margin of that area is more consistent with dystrophic calcification or chronic blood products than acute intracranial hemorrhage (sagittal image 25). 2. No convincing acute intracranial abnormality. 3. Other chronic small vessel disease, chronic Left PICA cerebellar infarct. Electronically Signed   By: Odessa Fleming M.D.   On: 11/11/2022 13:33   [place] EKG SITE RITE  Result Date: 11/11/2022 If Site Rite image not attached,  placement could not be confirmed due to current cardiac rhythm.  EEG adult  Result Date: 11/11/2022 Charlsie Quest, MD     11/11/2022  1:54 PM Patient Name: Aeric Burnham MRN: [id number] Epilepsy Attending: Charlsie Quest Referring Physician/Provider: Leroy Sea, MD Date: 11/11/2022 Duration: 22.28 mins Patient history: 77yo M with ams getting eeg to evaluate for seizure Level of alertness: Awake AEDs during EEG study: None Technical aspects: This EEG study was done with scalp electrodes positioned according to the 10-20 International system of electrode placement. Electrical activity was reviewed with band pass filter of 1-70Hz , sensitivity of 7 uV/mm, display speed of 53mm/sec with a 60Hz  notched filter applied as appropriate. EEG data were recorded continuously and digitally stored.  Video monitoring was available and reviewed as appropriate. Description: The posterior dominant rhythm consists of 7 Hz activity of moderate voltage (25-35 uV) seen predominantly in posterior head regions, symmetric and reactive to eye opening and eye closing. EEG showed continuous generalized 5 to 7 Hz theta slowing. Hyperventilation and photic stimulation were not performed.   ABNORMALITY - Continuous slow, generalized IMPRESSION: This study is suggestive of mild to moderate diffuse encephalopathy. No seizures or epileptiform discharges were seen throughout the recording. Charlsie Quest   DG Chest Port 1 View  Result Date: 11/11/2022 CLINICAL DATA:  Shortness of breath EXAM: PORTABLE CHEST 1 VIEW COMPARISON:  CXR 09/01/17 FINDINGS: No pleural effusion. No pneumothorax. Normal cardiac and mediastinal contours. There are multilevel opacity airspace opacities in the left lung, suspicious for infection. No radiographically apparent displaced rib fractures. Visualized upper abdomen is unremarkable. IMPRESSION: Multifocal airspace opacities in the left lung are suspicious for infection. Electronically Signed   By:  Lorenza Cambridge M.D.   On: 11/11/2022 08:14   CT HIP LEFT W CONTRAST  Result Date: 11/10/2022 CLINICAL DATA:  Sepsis with leukocytosis and multiple wounds. History of CVA with left-sided weakness. Patient is bed bound. Concern for osteomyelitis. EXAM: CT OF THE LOWER LEFT EXTREMITY WITH CONTRAST TECHNIQUE: Multidetector CT imaging of the left hip was performed according to the standard protocol following intravenous contrast administration. RADIATION DOSE REDUCTION: This exam was performed according to the departmental dose-optimization program which includes automated exposure control, adjustment of the mA and/or kV according to patient size and/or use of iterative reconstruction technique. CONTRAST:  75mL OMNIPAQUE IOHEXOL 350 MG/ML SOLN COMPARISON:  Radiographs 11/09/2022.  None additional. FINDINGS: Bones/Joint/Cartilage The patient's left hip is flexed approximately 90 degrees. There is no evidence of acute fracture, dislocation or femoral head osteonecrosis. The bones are demineralized. Mild degenerative changes at both hips without evidence of large hip joint effusion. No cortical destruction identified to suggest osteomyelitis. Ligaments Suboptimally assessed by CT. Muscles and Tendons Mild generalized muscular atrophy. No focal intramuscular fluid collection or abnormal enhancement identified. Soft tissues Possible mild decubitus ulceration over the left greater trochanter and left ischium without evidence of underlying focal fluid collection, foreign  body or soft tissue emphysema. No significant underlying subcutaneous inflammatory changes are identified. There is a moderate amount of stool within the rectum. Mild iliofemoral atherosclerosis noted. IMPRESSION: 1. Possible mild decubitus ulceration over the left greater trochanter and left ischium without evidence of underlying focal fluid collection, foreign body or soft tissue emphysema. 2. No CT evidence of osteomyelitis or septic arthritis. 3. No  acute osseous findings. Electronically Signed   By: Carey Bullocks M.D.   On: 11/10/2022 09:32   DG Hip Unilat W or Wo Pelvis 2-3 Views Left  Result Date: 11/09/2022 CLINICAL DATA:  Left hip wound. EXAM: DG HIP (WITH OR WITHOUT PELVIS) 2-3V LEFT COMPARISON:  None Available. FINDINGS: Evaluation is limited due to patient's positioning. No obvious fracture or dislocation. The bones are osteopenic. The soft tissues are grossly unremarkable. IMPRESSION: 1. No obvious fracture or dislocation. 2. Osteopenia. Electronically Signed   By: Elgie Collard M.D.   On: 11/09/2022 23:13   DG Chest Port 1 View  Result Date: 11/09/2022 CLINICAL DATA:  Question of sepsis.  Failure to thrive. EXAM: PORTABLE CHEST 1 VIEW COMPARISON:  09/01/2017 FINDINGS: Patient positioning limits evaluation. Heart size and pulmonary vascularity are normal. Emphysematous changes and scattered fibrosis in the lungs. No focal consolidation or airspace disease is identified. No pleural effusions. No pneumothorax. Mediastinal contours appear intact. IMPRESSION: Emphysematous changes and scattered fibrosis in the lungs. No focal consolidation. Electronically Signed   By: Burman Nieves M.D.   On: 11/09/2022 21:41   (Echo, Carotid, EGD, Colonoscopy, ERCP)    Subjective: Patient in bed, appears comfortable, he is unable to provide any complaints today.   Discharge Exam: Vitals:   11/13/22 1156 11/13/22 1314  BP:  95/61  Pulse: 94   Resp: 18 18  Temp: 97.7 F (36.5 C) (!) 97.5 F (36.4 C)  SpO2:     Vitals:   11/13/22 0000 11/13/22 0740 11/13/22 1156 11/13/22 1314  BP: (!) 121/96 116/79  95/61  Pulse: 98 94 94   Resp: 18 18 18 18   Temp: 98.8 F (37.1 C) 98.6 F (37 C) 97.7 F (36.5 C) (!) 97.5 F (36.4 C)  TempSrc: Axillary Oral Oral Oral  SpO2: 97%     Weight:      Height:          Frail, elderly, cachectic white male, he is awake, dense left-sided hemiparesis and contracture Symmetrical Chest wall movement,  Good air movement bilaterally RRR,No Gallops,Rubs or new Murmurs, No Parasternal Heave +ve B.Sounds, Abd Soft, No tenderness, avoid abdomen No Cyanosis, Clubbing or edema, contracted.      The results of significant diagnostics from this hospitalization (including imaging, microbiology, ancillary and laboratory) are listed below for reference.     Microbiology: Recent Results (from the past 240 hour(s))  Blood Culture (routine x 2)     Status: None (Preliminary result)   Collection Time: 11/09/22 10:01 PM   Specimen: BLOOD  Result Value Ref Range Status   Specimen Description BLOOD SITE NOT SPECIFIED  Final   Special Requests   Final    BOTTLES DRAWN AEROBIC AND ANAEROBIC Blood Culture adequate volume   Culture   Final    NO GROWTH 4 DAYS Performed at [facility], [address], [place], [place] [address]    Report Status PENDING  Incomplete  Blood Culture (routine x 2)     Status: None (Preliminary result)   Collection Time: 11/09/22 10:42 PM   Specimen: BLOOD LEFT FOREARM  Result Value Ref Range Status   Specimen Description BLOOD LEFT FOREARM  Final   Special Requests   Final    BOTTLES DRAWN AEROBIC AND ANAEROBIC Blood Culture adequate volume   Culture   Final    NO GROWTH 3 DAYS Performed at [facility], [address], [place], [place] [address]    Report Status PENDING  Incomplete  Resp panel by RT-PCR (RSV, Flu A&B, Covid)     Status: None   Collection Time: 11/09/22 10:42 PM   Specimen: Nasal Swab  Result Value Ref Range Status   SARS Coronavirus 2 by RT PCR NEGATIVE NEGATIVE Final   Influenza A by PCR NEGATIVE NEGATIVE Final   Influenza B by PCR NEGATIVE NEGATIVE Final    Comment: (NOTE) The Xpert Xpress SARS-CoV-2/FLU/RSV plus assay is intended as an aid in the diagnosis of influenza from Nasopharyngeal swab specimens and should not be used as a sole basis for treatment. Nasal washings and aspirates are unacceptable for Xpert Xpress  SARS-CoV-2/FLU/RSV testing.  Fact Sheet for Patients: BloggerCourse.com  Fact Sheet for Healthcare Providers: SeriousBroker.it  This test is not yet approved or cleared by the [place] FDA and has been authorized for detection and/or diagnosis of SARS-CoV-2 by FDA under an Emergency Use Authorization (EUA). This EUA will remain in effect (meaning this test can be used) for the duration of the COVID-19 declaration under Section 564(b)(1) of the Act, 21 U.S.C. section 360bbb-3(b)(1), unless the authorization is terminated or revoked.     Resp Syncytial Virus by PCR NEGATIVE NEGATIVE Final    Comment: (NOTE) Fact Sheet for Patients: BloggerCourse.com  Fact Sheet for Healthcare Providers: SeriousBroker.it  This test is not yet approved or cleared by the [place] FDA and has been authorized for detection and/or diagnosis of SARS-CoV-2 by FDA under an Emergency Use Authorization (EUA). This EUA will remain in effect (meaning this test can be used) for the duration of the COVID-19 declaration under Section 564(b)(1) of the Act, 21 U.S.C. section 360bbb-3(b)(1), unless the authorization is terminated or revoked.  Performed at [facility], [address] [place], [place] [address]   MRSA Next Gen by PCR, Nasal     Status: None   Collection Time: 11/11/22  5:38 AM   Specimen: Nasal Mucosa; Nasal Swab  Result Value Ref Range Status   MRSA by PCR Next Gen NOT DETECTED NOT DETECTED Final    Comment: (NOTE) The GeneXpert MRSA Assay (FDA approved for NASAL specimens only), is one component of a comprehensive MRSA colonization surveillance program. It is not intended to diagnose MRSA infection nor to guide or monitor treatment for MRSA infections. Test performance is not FDA approved in patients less than 16 years old. Performed at [facility], [address], [place], [place] [address]      Labs: BNP (last 3 results) No results for input(s): "BNP" in the last 8760 hours. Basic Metabolic Panel: Recent Labs  Lab 11/09/22 2108 11/10/22 0340 11/11/22 0419 11/12/22 0329  NA 138 136 138 134*  K 3.5 3.8 2.8* 2.9*  CL 99 103 107 107  CO2 23 24 20* 21*  GLUCOSE 123* 105* 96 92  BUN 30* 25* 18 13  CREATININE 1.25* 0.94 0.63 0.67  CALCIUM 9.1 8.5* 8.0* 7.5*  MG  --   --  1.8 1.9   Liver Function Tests: Recent Labs  Lab 11/09/22 2108 11/10/22 0340 11/11/22 0419  AST 18 15 14*  ALT  11 11 9   ALKPHOS 62 56 47  BILITOT 1.4* 1.5* 0.7  PROT 7.0 5.9* 5.1*  ALBUMIN 2.8* 2.4* 2.0*   No results for input(s): "LIPASE", "AMYLASE" in the last 168 hours. Recent Labs  Lab 11/11/22 1903  AMMONIA 18   CBC: Recent Labs  Lab 11/09/22 2108 11/10/22 0340 11/11/22 0419 11/12/22 0329  WBC 19.1* 12.3* 7.5 9.1  NEUTROABS 16.4*  --   --  7.4  HGB 13.8 12.2* 9.8* 9.8*  HCT 43.0 37.8* 30.0* 30.0*  MCV 79.6* 78.9* 79.8* 80.0  PLT 376 257 183 206   Cardiac Enzymes: No results for input(s): "CKTOTAL", "CKMB", "CKMBINDEX", "TROPONINI" in the last 168 hours. BNP: Invalid input(s): "POCBNP" CBG: Recent Labs  Lab 11/11/22 2336 11/12/22 0404 11/12/22 1006 11/12/22 1615 11/13/22 1217  GLUCAP 81 83 80 99 65*   D-Dimer No results for input(s): "DDIMER" in the last 72 hours. Hgb A1c No results for input(s): "HGBA1C" in the last 72 hours. Lipid Profile No results for input(s): "CHOL", "HDL", "LDLCALC", "TRIG", "CHOLHDL", "LDLDIRECT" in the last 72 hours. Thyroid function studies Recent Labs    11/11/22 1158  TSH 1.480   Anemia work up Recent Labs    11/11/22 1158  FOLATE 36.8   Urinalysis    Component Value Date/Time   COLORURINE AMBER (A) 11/10/2022 0330   APPEARANCEUR CLEAR 11/10/2022 0330   LABSPEC 1.027 11/10/2022 0330   PHURINE 5.0 11/10/2022 0330   GLUCOSEU NEGATIVE 11/10/2022 0330   HGBUR NEGATIVE 11/10/2022 0330    BILIRUBINUR NEGATIVE 11/10/2022 0330   KETONESUR 5 (A) 11/10/2022 0330   PROTEINUR 30 (A) 11/10/2022 0330   NITRITE NEGATIVE 11/10/2022 0330   LEUKOCYTESUR NEGATIVE 11/10/2022 0330   Sepsis Labs Recent Labs  Lab 11/09/22 2108 11/10/22 0340 11/11/22 0419 11/12/22 0329  WBC 19.1* 12.3* 7.5 9.1   Microbiology Recent Results (from the past 240 hour(s))  Blood Culture (routine x 2)     Status: None (Preliminary result)   Collection Time: 11/09/22 10:01 PM   Specimen: BLOOD  Result Value Ref Range Status   Specimen Description BLOOD SITE NOT SPECIFIED  Final   Special Requests   Final    BOTTLES DRAWN AEROBIC AND ANAEROBIC Blood Culture adequate volume   Culture   Final    NO GROWTH 4 DAYS Performed at [facility], [address], [place], [place] [address]    Report Status PENDING  Incomplete  Blood Culture (routine x 2)     Status: None (Preliminary result)   Collection Time: 11/09/22 10:42 PM   Specimen: BLOOD LEFT FOREARM  Result Value Ref Range Status   Specimen Description BLOOD LEFT FOREARM  Final   Special Requests   Final    BOTTLES DRAWN AEROBIC AND ANAEROBIC Blood Culture adequate volume   Culture   Final    NO GROWTH 3 DAYS Performed at [facility], [address], [place], [place] [address]    Report Status PENDING  Incomplete  Resp panel by RT-PCR (RSV, Flu A&B, Covid)     Status: None   Collection Time: 11/09/22 10:42 PM   Specimen: Nasal Swab  Result Value Ref Range Status   SARS Coronavirus 2 by RT PCR NEGATIVE NEGATIVE Final   Influenza A by PCR NEGATIVE NEGATIVE Final   Influenza B by PCR NEGATIVE NEGATIVE Final    Comment: (NOTE) The Xpert Xpress SARS-CoV-2/FLU/RSV plus assay is intended as an aid in the diagnosis of influenza from Nasopharyngeal swab specimens  and should not be used as a sole basis for treatment. Nasal washings and aspirates are unacceptable for Xpert Xpress SARS-CoV-2/FLU/RSV testing.  Fact Sheet for  Patients: BloggerCourse.com  Fact Sheet for Healthcare Providers: SeriousBroker.it  This test is not yet approved or cleared by the [place] FDA and has been authorized for detection and/or diagnosis of SARS-CoV-2 by FDA under an Emergency Use Authorization (EUA). This EUA will remain in effect (meaning this test can be used) for the duration of the COVID-19 declaration under Section 564(b)(1) of the Act, 21 U.S.C. section 360bbb-3(b)(1), unless the authorization is terminated or revoked.     Resp Syncytial Virus by PCR NEGATIVE NEGATIVE Final    Comment: (NOTE) Fact Sheet for Patients: BloggerCourse.com  Fact Sheet for Healthcare Providers: SeriousBroker.it  This test is not yet approved or cleared by the [place] FDA and has been authorized for detection and/or diagnosis of SARS-CoV-2 by FDA under an Emergency Use Authorization (EUA). This EUA will remain in effect (meaning this test can be used) for the duration of the COVID-19 declaration under Section 564(b)(1) of the Act, 21 U.S.C. section 360bbb-3(b)(1), unless the authorization is terminated or revoked.  Performed at [facility], [address], [place], [place] [address]   MRSA Next Gen by PCR, Nasal     Status: None   Collection Time: 11/11/22  5:38 AM   Specimen: Nasal Mucosa; Nasal Swab  Result Value Ref Range Status   MRSA by PCR Next Gen NOT DETECTED NOT DETECTED Final    Comment: (NOTE) The GeneXpert MRSA Assay (FDA approved for NASAL specimens only), is one component of a comprehensive MRSA colonization surveillance program. It is not intended to diagnose MRSA infection nor to guide or monitor treatment for MRSA infections. Test performance is not FDA approved in patients less than 91 years old. Performed at [facility], [address], [place], [place] [address]      Time  coordinating discharge: Over 30 minutes  SIGNED:   Huey Bienenstock, MD  [facility] 11/13/2022, 3:13 PM Pager   If 7PM-7AM, please contact night-coverage [email or web address] Password [email or web address]

## 2022-11-13 NOTE — Progress Notes (Signed)
Nutrition Brief Note  Chart reviewed. Pt transitioning to comfort care.  No further nutrition interventions planned at this time.  Please re-consult as needed.    Mertie Clause, MS, RD, LDN Registered Dietitian II Please see AMiON for contact information.

## 2022-11-13 NOTE — Progress Notes (Signed)
PROGRESS NOTE                                                                                                                                                                                                             Patient Demographics:    Douglas Spencer, is a 77 y.o. male, DOB - January 31, 1946, ZOX:[id number]  Outpatient Primary MD for the patient is [name], MD    LOS - 3  Admit date - 11/09/2022    Chief Complaint  Patient presents with   Altered Mental Status   Failure To Thrive       Brief Narrative (HPI from H&P)    77 y.o. male with medical history significant for history of CVA with residual left-sided weakness, dysarthria, cachectic, bedbound status, history of seizures, anxiety, former substance use who presented to the ED from home with altered mental status.  Currently living at home with a roommate who takes care of him.  He is immobile and bedbound.  He was confused for the past hour, EMS was called, and when they arrived he was retinae urine saturated blanket stuck to his wounds and had ants crawling all over his body.  Reports poor appetite.  He was found to be septic in the ER with tachycardia, leukocytosis, elevated lactic acid and was placed on broad-spectrum antibiotics and admitted to the hospital, possible sources were aspiration pneumonia and left sided sacral decubitus ulcer which were present on admission.   Subjective:   Patient in bed, appears comfortable, he is unable to provide any complaints today.   Assessment  & Plan :   Severe sepsis due to large left hip wound lung with possible aspiration pneumonia -report noted, wound care team following, orthopedics Dr. Velda Shell consulted for debridement but patient wants to pursue medical treatment, if declines wants to pursue comfort measures.  -Medicine input greatly appreciated, currently patient full comfort measures.   History of CVA with  left-sided weakness and contractures, bedbound - chronically ill with poor long-term prognosis.  Per report lives at home with a roommate who reportedly cares for him.  Arrived in urine soaked blanket and ants all over his body.  Supportive care mentation has improved, head CT acute, EEG unremarkable, stable TSH and ammonia levels, normal B12 being replaced   B12 borderline low normal - placed on replacement.  Acute metabolic encephalopathy - the above.   Chronic hepatitis C-seen by ID as an outpatient, he completed treatment with Mavyret, with no detectable hep C RNA level.   Chronic diastolic CHF - most recent 2D echo 2022 showed normal LVEF 65 to 65%, grade 1 diastolic dysfunction.  Currently appears dry.   Mild macrocytic anemia - B12 low normal and being replaced.   Elevated isolated bilirubin - unclear significance, AST, ALT, alk phos normal. Monitor.   History of seizures - continue Vimpat.  EEG nonacute.  Mentation back to baseline.   Hypokalemia.  Replaced.    Dysphagia.  Speech on board.    Anxiety - continue Atarax as needed.   Right arm PICC line.  Goals of care: -Palliative medicine input greatly appreciated, at this point patient is full comfort measures, awaiting residential hospice placement       Condition -comfort  Family Communication  : None at bedside  Code Status : DNR-comfort  Consults  : Orthopedics, palliative care  PUD Prophylaxis :    Procedures  :     CT -  1. Possible mild decubitus ulceration over the left greater trochanter and left ischium without evidence of underlying focal fluid collection, foreign body or soft tissue emphysema. 2. No CT evidence of osteomyelitis or septic arthritis. 3. No acute osseous findings.      Disposition Plan  :    Status is: Inpatient   DVT Prophylaxis  :       Lab Results  Component Value Date   PLT 206 11/12/2022    Diet :  Diet Order             Diet regular Room service appropriate?  Yes; Fluid consistency: Thin  Diet effective now                    Inpatient Medications  Scheduled Meds:  antiseptic oral rinse  15 mL Topical BID   doxycycline  100 mg Oral Q12H   sodium hypochlorite   Irrigation BID   Continuous Infusions:  lacosamide (VIMPAT) IV 50 mg (11/13/22 1012)   PRN Meds:.acetaminophen **OR** acetaminophen, diphenhydrAMINE, glycopyrrolate **OR** glycopyrrolate **OR** glycopyrrolate, haloperidol **OR** haloperidol **OR** haloperidol lactate, LORazepam **OR** LORazepam **OR** LORazepam, morphine injection, ondansetron **OR** ondansetron (ZOFRAN) IV, oxyCODONE, polyvinyl alcohol     Objective:   Vitals:   11/12/22 2200 11/13/22 0000 11/13/22 0740 11/13/22 1156  BP:  (!) 121/96 116/79   Pulse: 98 98    Resp: 20 18 18 18   Temp:  98.8 F (37.1 C) 98.6 F (37 C) 97.7 F (36.5 C)  TempSrc:  Axillary Oral Oral  SpO2: 98% 97%    Weight:      Height:        Wt Readings from Last 3 Encounters:  11/10/22 44.8 kg  02/21/22 67.1 kg  02/12/22 67.2 kg     Intake/Output Summary (Last 24 hours) at 11/13/2022 1240 Last data filed at 11/13/2022 0900 Gross per 24 hour  Intake 681 ml  Output --  Net 681 ml     Physical Exam  Frail, elderly, cachectic white male, he is awake, dense left-sided hemiparesis and contracture Symmetrical Chest wall movement, Good air movement bilaterally RRR,No Gallops,Rubs or new Murmurs, No Parasternal Heave +ve B.Sounds, Abd Soft, No tenderness, avoid abdomen No Cyanosis, Clubbing or edema, contracted.     RN pressure injury documentation: Pressure Injury 11/10/22 Hip Right Stage 2 -  Partial thickness loss of dermis presenting as a  shallow open injury with a red, pink wound bed without slough. (Active)  11/10/22 1500  Location: Hip  Location Orientation: Right  Staging: Stage 2 -  Partial thickness loss of dermis presenting as a shallow open injury with a red, pink wound bed without slough.  Wound Description  (Comments):   Present on Admission: Yes  Dressing Type Foam - Lift dressing to assess site every shift 11/13/22 1014     Pressure Injury 11/10/22 Hip Left Unstageable - Full thickness tissue loss in which the base of the injury is covered by slough (yellow, tan, gray, green or brown) and/or eschar (tan, brown or black) in the wound bed. (Active)  11/10/22 1500  Location: Hip  Location Orientation: Left  Staging: Unstageable - Full thickness tissue loss in which the base of the injury is covered by slough (yellow, tan, gray, green or brown) and/or eschar (tan, brown or black) in the wound bed.  Wound Description (Comments):   Present on Admission: Yes  Dressing Type Gauze (Comment);ABD;Dakin's-soaked gauze 11/13/22 1014      Data Review:    Recent Labs  Lab 11/09/22 2108 11/10/22 0340 11/11/22 0419 11/12/22 0329  WBC 19.1* 12.3* 7.5 9.1  HGB 13.8 12.2* 9.8* 9.8*  HCT 43.0 37.8* 30.0* 30.0*  PLT 376 257 183 206  MCV 79.6* 78.9* 79.8* 80.0  MCH 25.6* 25.5* 26.1 26.1  MCHC 32.1 32.3 32.7 32.7  RDW 15.0 14.8 14.9 14.7  LYMPHSABS 1.3  --   --  1.2  MONOABS 1.1*  --   --  0.4  EOSABS 0.1  --   --  0.1  BASOSABS 0.1  --   --  0.0    Recent Labs  Lab 11/09/22 2108 11/09/22 2124 11/10/22 0029 11/10/22 0340 11/11/22 0419 11/11/22 1158 11/11/22 1903 11/12/22 0329  NA 138  --   --  136 138  --   --  134*  K 3.5  --   --  3.8 2.8*  --   --  2.9*  CL 99  --   --  103 107  --   --  107  CO2 23  --   --  24 20*  --   --  21*  ANIONGAP 16*  --   --  9 11  --   --  6  GLUCOSE 123*  --   --  105* 96  --   --  92  BUN 30*  --   --  25* 18  --   --  13  CREATININE 1.25*  --   --  0.94 0.63  --   --  0.67  AST 18  --   --  15 14*  --   --   --   ALT 11  --   --  11 9  --   --   --   ALKPHOS 62  --   --  56 47  --   --   --   BILITOT 1.4*  --   --  1.5* 0.7  --   --   --   ALBUMIN 2.8*  --   --  2.4* 2.0*  --   --   --   CRP  --   --   --   --  14.2*  --   --  8.1*  PROCALCITON   --   --   --  0.65 0.56  --   --  0.30  LATICACIDVEN  --  6.2* 4.7* 2.7*  --   --   --   --   INR 1.1  --   --   --   --   --   --   --   TSH  --   --   --   --   --  1.480  --   --   AMMONIA  --   --   --   --   --   --  18  --   MG  --   --   --   --  1.8  --   --  1.9  CALCIUM 9.1  --   --  8.5* 8.0*  --   --  7.5*      Recent Labs  Lab 11/09/22 2108 11/09/22 2124 11/10/22 0029 11/10/22 0340 11/11/22 0419 11/11/22 1158 11/11/22 1903 11/12/22 0329  CRP  --   --   --   --  14.2*  --   --  8.1*  PROCALCITON  --   --   --  0.65 0.56  --   --  0.30  LATICACIDVEN  --  6.2* 4.7* 2.7*  --   --   --   --   INR 1.1  --   --   --   --   --   --   --   TSH  --   --   --   --   --  1.480  --   --   AMMONIA  --   --   --   --   --   --  18  --   MG  --   --   --   --  1.8  --   --  1.9  CALCIUM 9.1  --   --  8.5* 8.0*  --   --  7.5*    --------------------------------------------------------------------------------------------------------------- Lab Results  Component Value Date   CHOL 109 01/29/2022   HDL 30.60 (L) 01/29/2022   LDLCALC 49 01/29/2022   TRIG 148.0 01/29/2022   CHOLHDL 4 01/29/2022    Lab Results  Component Value Date   HGBA1C 5.3 01/31/2021   Recent Labs    11/11/22 1158  TSH 1.480   Recent Labs    11/11/22 1158  FOLATE 36.8    Radiology Reports CT HEAD WO CONTRAST ( )  Result Date: 11/11/2022 CLINICAL DATA:  77 year old male with shortness of breath. Sepsis. Decubitus wounds. "Stroke follow-up". EXAM: CT HEAD WITHOUT CONTRAST TECHNIQUE: Contiguous axial images were obtained from the base of the skull through the vertex without intravenous contrast. RADIATION DOSE REDUCTION: This exam was performed according to the departmental dose-optimization program which includes automated exposure control, adjustment of the mA and/or kV according to patient size and/or use of iterative reconstruction technique. COMPARISON:  Brain MRI 02/01/2021 and earlier.  FINDINGS: Brain: Expected evolution of the right corona radiata and basal ganglia lacunar infarct seen in 2022. Chronic encephalomalacia there now. Associated right brainstem Wallerian degeneration. Chronic left cerebellum PICA territory infarct redemonstrated. Small and circumscribed hyperdense foci in the right periventricular white matter on coronal images 39 and 40 more resemble dystrophic calcification than acute hemorrhage, and are along the margin of the chronic corona radiata infarct there. Patchy additional bilateral cerebral white matter hypodensity. No convincing acute intracranial hemorrhage. No midline shift, acute ventriculomegaly, mass effect, evidence of mass lesion, or evidence of cortically based acute infarction. Vascular: No suspicious intracranial vascular hyperdensity. Skull: No  acute osseous abnormality identified. Sinuses/Orbits: Visualized paranasal sinuses and mastoids are clear. Other: Visualized orbits and scalp soft tissues are within normal limits. IMPRESSION: 1. Sequelae of 2022 right corona radiata and brainstem lacunar infarct with Wallerian degeneration. Curvilinear hyperdensity along the anterior margin of that area is more consistent with dystrophic calcification or chronic blood products than acute intracranial hemorrhage (sagittal image 25). 2. No convincing acute intracranial abnormality. 3. Other chronic small vessel disease, chronic Left PICA cerebellar infarct. Electronically Signed   By: Odessa Fleming M.D.   On: 11/11/2022 13:33   [place] EKG SITE RITE  Result Date: 11/11/2022 If Site Rite image not attached, placement could not be confirmed due to current cardiac rhythm.  EEG adult  Result Date: 11/11/2022 Charlsie Quest, MD     11/11/2022  1:54 PM Patient Name: Manasseh Pittsley MRN: [id number] Epilepsy Attending: Charlsie Quest Referring Physician/Provider: Leroy Sea, MD Date: 11/11/2022 Duration: 22.28 mins Patient history: 78yo M with ams getting eeg to evaluate  for seizure Level of alertness: Awake AEDs during EEG study: None Technical aspects: This EEG study was done with scalp electrodes positioned according to the 10-20 International system of electrode placement. Electrical activity was reviewed with band pass filter of 1-70Hz , sensitivity of 7 uV/mm, display speed of 74mm/sec with a 60Hz  notched filter applied as appropriate. EEG data were recorded continuously and digitally stored.  Video monitoring was available and reviewed as appropriate. Description: The posterior dominant rhythm consists of 7 Hz activity of moderate voltage (25-35 uV) seen predominantly in posterior head regions, symmetric and reactive to eye opening and eye closing. EEG showed continuous generalized 5 to 7 Hz theta slowing. Hyperventilation and photic stimulation were not performed.   ABNORMALITY - Continuous slow, generalized IMPRESSION: This study is suggestive of mild to moderate diffuse encephalopathy. No seizures or epileptiform discharges were seen throughout the recording. Charlsie Quest   DG Chest Port 1 View  Result Date: 11/11/2022 CLINICAL DATA:  Shortness of breath EXAM: PORTABLE CHEST 1 VIEW COMPARISON:  CXR 09/01/17 FINDINGS: No pleural effusion. No pneumothorax. Normal cardiac and mediastinal contours. There are multilevel opacity airspace opacities in the left lung, suspicious for infection. No radiographically apparent displaced rib fractures. Visualized upper abdomen is unremarkable. IMPRESSION: Multifocal airspace opacities in the left lung are suspicious for infection. Electronically Signed   By: Lorenza Cambridge M.D.   On: 11/11/2022 08:14   CT HIP LEFT W CONTRAST  Result Date: 11/10/2022 CLINICAL DATA:  Sepsis with leukocytosis and multiple wounds. History of CVA with left-sided weakness. Patient is bed bound. Concern for osteomyelitis. EXAM: CT OF THE LOWER LEFT EXTREMITY WITH CONTRAST TECHNIQUE: Multidetector CT imaging of the left hip was performed according to  the standard protocol following intravenous contrast administration. RADIATION DOSE REDUCTION: This exam was performed according to the departmental dose-optimization program which includes automated exposure control, adjustment of the mA and/or kV according to patient size and/or use of iterative reconstruction technique. CONTRAST:  75mL OMNIPAQUE IOHEXOL 350 MG/ML SOLN COMPARISON:  Radiographs 11/09/2022.  None additional. FINDINGS: Bones/Joint/Cartilage The patient's left hip is flexed approximately 90 degrees. There is no evidence of acute fracture, dislocation or femoral head osteonecrosis. The bones are demineralized. Mild degenerative changes at both hips without evidence of large hip joint effusion. No cortical destruction identified to suggest osteomyelitis. Ligaments Suboptimally assessed by CT. Muscles and Tendons Mild generalized muscular atrophy. No focal intramuscular fluid collection or abnormal enhancement identified. Soft tissues Possible mild decubitus ulceration over the left  greater trochanter and left ischium without evidence of underlying focal fluid collection, foreign body or soft tissue emphysema. No significant underlying subcutaneous inflammatory changes are identified. There is a moderate amount of stool within the rectum. Mild iliofemoral atherosclerosis noted. IMPRESSION: 1. Possible mild decubitus ulceration over the left greater trochanter and left ischium without evidence of underlying focal fluid collection, foreign body or soft tissue emphysema. 2. No CT evidence of osteomyelitis or septic arthritis. 3. No acute osseous findings. Electronically Signed   By: Carey Bullocks M.D.   On: 11/10/2022 09:32   DG Hip Unilat W or Wo Pelvis 2-3 Views Left  Result Date: 11/09/2022 CLINICAL DATA:  Left hip wound. EXAM: DG HIP (WITH OR WITHOUT PELVIS) 2-3V LEFT COMPARISON:  None Available. FINDINGS: Evaluation is limited due to patient's positioning. No obvious fracture or dislocation. The  bones are osteopenic. The soft tissues are grossly unremarkable. IMPRESSION: 1. No obvious fracture or dislocation. 2. Osteopenia. Electronically Signed   By: Elgie Collard M.D.   On: 11/09/2022 23:13   DG Chest Port 1 View  Result Date: 11/09/2022 CLINICAL DATA:  Question of sepsis.  Failure to thrive. EXAM: PORTABLE CHEST 1 VIEW COMPARISON:  09/01/2017 FINDINGS: Patient positioning limits evaluation. Heart size and pulmonary vascularity are normal. Emphysematous changes and scattered fibrosis in the lungs. No focal consolidation or airspace disease is identified. No pleural effusions. No pneumothorax. Mediastinal contours appear intact. IMPRESSION: Emphysematous changes and scattered fibrosis in the lungs. No focal consolidation. Electronically Signed   By: Burman Nieves M.D.   On: 11/09/2022 21:41      Signature  -   Huey Bienenstock M.D on 11/13/2022 at 12:40 PM   -  To page go to [email or web address]

## 2022-11-13 NOTE — TOC Transition Note (Signed)
Transition of Care [facility]) - CM/SW Discharge Note   Patient Details  Name: Douglas Spencer MRN: [id number] Date of Birth: 03/08/1945  Transition of Care [facility]) CM/SW Contact:  Mearl Latin, LCSW Phone Number: 11/13/2022, 4:30 PM   Clinical Narrative:    Patient will DC to: [facility] Anticipated DC date: 11/13/22 Family notified: Brother, Niece [name] Transport by: Sharin Mons   Per MD patient ready for DC to [facility]. RN to call report prior to discharge [phone]). RN, patient, patient's family, and facility notified of DC. Discharge Summary sent to facility. DC packet on chart including signed DNR. Ambulance transport requested for patient.   CSW will sign off for now as social work intervention is no longer needed. Please consult [place] again if new needs arise.     Final next level of care: Hospice Medical Facility Barriers to Discharge: Barriers Resolved   Patient Goals and CMS Choice CMS Medicare.gov Compare Post Acute Care list provided to:: Patient Choice offered to / list presented to : Patient  Discharge Placement                Patient chooses bed at:  [facility]) Patient to be transferred to facility by: [facility] Name of family member notified: Brother Patient and family notified of of transfer: 11/13/22  Discharge Plan and Services Additional resources added to the After Visit Summary for   In-house Referral: Clinical Social Work, Hospice / Palliative Care   Post Acute Care Choice: Hospice                               Social Determinants of Health (SDOH) Interventions SDOH Screenings   Food Insecurity: Patient Unable To Answer (11/10/2022)  Housing: Patient Unable To Answer (11/10/2022)  Transportation Needs: Patient Unable To Answer (11/10/2022)  Utilities: Patient Unable To Answer (11/10/2022)  Alcohol Screen: Low Risk  (02/21/2022)  Depression (PHQ2-9): Low Risk  (02/21/2022)  Financial Resource Strain: Low Risk  (02/21/2022)   Physical Activity: Inactive (02/21/2022)  Social Connections: Unknown (02/21/2022)  Stress: No Stress Concern Present (02/21/2022)  Tobacco Use: Medium Risk (02/21/2022)     Readmission Risk Interventions     No data to display

## 2022-11-13 NOTE — Care Management Important Message (Signed)
Important Message  Patient Details  Name: Douglas Spencer MRN: [id number] Date of Birth: January 01, 1946   Important Message Given:  Yes - Medicare IM     Dorena Bodo 11/13/2022, 2:17 PM

## 2022-11-13 NOTE — Plan of Care (Signed)
  Problem: Fluid Volume: Goal: Hemodynamic stability will improve Outcome: Completed/Met   Problem: Clinical Measurements: Goal: Diagnostic test results will improve Outcome: Completed/Met Goal: Signs and symptoms of infection will decrease Outcome: Completed/Met   Problem: Respiratory: Goal: Ability to maintain adequate ventilation will improve Outcome: Completed/Met   Problem: Education: Goal: Knowledge of General Education information will improve Description: Including pain rating scale, medication(s)/side effects and non-pharmacologic comfort measures Outcome: Completed/Met   Problem: Health Behavior/Discharge Planning: Goal: Ability to manage health-related needs will improve Outcome: Completed/Met   Problem: Clinical Measurements: Goal: Ability to maintain clinical measurements within normal limits will improve Outcome: Completed/Met Goal: Will remain free from infection Outcome: Completed/Met Goal: Diagnostic test results will improve Outcome: Completed/Met Goal: Respiratory complications will improve Outcome: Completed/Met Goal: Cardiovascular complication will be avoided Outcome: Completed/Met   Problem: Activity: Goal: Risk for activity intolerance will decrease Outcome: Completed/Met   Problem: Nutrition: Goal: Adequate nutrition will be maintained Outcome: Completed/Met   Problem: Coping: Goal: Level of anxiety will decrease Outcome: Completed/Met   Problem: Elimination: Goal: Will not experience complications related to bowel motility Outcome: Completed/Met Goal: Will not experience complications related to urinary retention Outcome: Completed/Met   Problem: Pain Managment: Goal: General experience of comfort will improve Outcome: Completed/Met   Problem: Safety: Goal: Ability to remain free from injury will improve Outcome: Completed/Met   Problem: Skin Integrity: Goal: Risk for impaired skin integrity will decrease Outcome:  Completed/Met   Problem: Education: Goal: Knowledge of the prescribed therapeutic regimen will improve Outcome: Completed/Met   Problem: Coping: Goal: Ability to identify and develop effective coping behavior will improve Outcome: Completed/Met   Problem: Clinical Measurements: Goal: Quality of life will improve Outcome: Completed/Met   Problem: Respiratory: Goal: Verbalizations of increased ease of respirations will increase Outcome: Completed/Met   Problem: Role Relationship: Goal: Family's ability to cope with current situation will improve Outcome: Completed/Met Goal: Ability to verbalize concerns, feelings, and thoughts to partner or family member will improve Outcome: Completed/Met   Problem: Pain Management: Goal: Satisfaction with pain management regimen will improve Outcome: Completed/Met

## 2022-11-13 NOTE — Progress Notes (Signed)
Daily Progress Note   Patient Name: Douglas Spencer       Date: 11/13/2022 DOB: November 17, 1945  Age: 77 y.o. MRN#: [id number] Attending Physician: Starleen Arms, MD Primary Care Physician: [name], MD Admit Date: 11/09/2022  Reason for Consultation/Follow-up: Non pain symptom management, Pain control, Psychosocial/spiritual support, and Terminal Care  Subjective: I have reviewed medical records including EPIC notes, MAR, and labs. Received report from primary RN - no acute concerns. RN reports patient has minimal oral intake. CBG is currently 65.   Went to visit patient at bedside - no family/visitors present.  Patient was lying in bed asleep - did not attempt to wake him to preserve comfort. No signs or non-verbal gestures of pain or discomfort noted. No respiratory distress, increased work of breathing, or secretions noted.   Discussed patient's current medical state with hospice liaison. Evaluation still pending.   Length of Stay: 3  Current Medications: Scheduled Meds:   antiseptic oral rinse  15 mL Topical BID   doxycycline  100 mg Oral Q12H   sodium hypochlorite   Irrigation BID    Continuous Infusions:  lacosamide (VIMPAT) IV 50 mg (11/12/22 2143)    PRN Meds: acetaminophen **OR** acetaminophen, diphenhydrAMINE, glycopyrrolate **OR** glycopyrrolate **OR** glycopyrrolate, haloperidol **OR** haloperidol **OR** haloperidol lactate, LORazepam **OR** LORazepam **OR** LORazepam, morphine injection, ondansetron **OR** ondansetron (ZOFRAN) IV, oxyCODONE, polyvinyl alcohol  Physical Exam Vitals and nursing note reviewed.  Constitutional:      General: He is not in acute distress.    Appearance: He is cachectic. He is ill-appearing.  Pulmonary:     Effort: No respiratory  distress.  Skin:    General: Skin is warm and dry.  Neurological:     Mental Status: He is lethargic.     Motor: Weakness present.             Vital Signs: BP 116/79 (BP Location: Right Wrist)   Pulse 98   Temp 98.6 F (37 C) (Oral)   Resp 18   Ht 5\' 8"  (1.727 m)   Wt 44.8 kg   SpO2 97%   BMI 15.02 kg/m  SpO2: SpO2: 97 % O2 Device: O2 Device: Room Air O2 Flow Rate: O2 Flow Rate (L/min): 2 L/min  Intake/output summary:  Intake/Output Summary (Last 24 hours) at 11/13/2022 1191 Last data filed  at 11/12/2022 1611 Gross per 24 hour  Intake 684 ml  Output 250 ml  Net 434 ml   LBM: Last BM Date : 11/12/22 Baseline Weight: Weight: 43.7 kg Most recent weight: Weight: 44.8 kg       Palliative Assessment/Data: PPS 10-20%      Patient Active Problem List   Diagnosis Date Noted   Severe sepsis with lactic acidosis (HCC) 11/10/2022   Complicated open wound of hip, left, initial encounter 11/10/2022   History of CVA with residual deficit 11/10/2022   History of seizure disorder 11/10/2022   Anxiety 11/10/2022   Bedbound 11/10/2022   Dyslipidemia, goal LDL below 70 01/29/2022   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 male with past medical history of CVA with residual left-sided weakness, dysarthria, cachectic, bedbound status, seizures, anxiety, former substance use was admitted on 11/09/2022 from home with severe sepsis secondary to large left hip wound.   Assessment: Principal Problem:   Severe sepsis with lactic acidosis (HCC) Active Problems:   Complicated open wound of hip, left, initial encounter   History of CVA with  residual deficit   History of seizure disorder   Anxiety   Bedbound   Terminal care   Recommendations/Plan: Continue full comfort measures Continue DNR/DNI as previously documented Transfer to hospice facility pending confirmation of eligibility and bed availability Continue current comfort focused medication regimen - no changes Continue palliative wound care PMT will continue to follow and support holistically  Symptom Management Morphine PRN severe pain/dyspnea/increased work of breathing/RR>25 Oxycodone PRN moderate pain Tylenol PRN pain/fever Biotin twice daily Benadryl PRN itching Robinul PRN secretions Haldol PRN agitation/delirium Ativan PRN anxiety/seizure/sleep/distress Zofran PRN nausea/vomiting Liquifilm Tears PRN dry eye Continue vimpat and antibiotics until discharge  Goals of Care and Additional Recommendations: Limitations on Scope of Treatment: Full Comfort Care  Code Status:    Code Status Orders  (From admission, onward)           Start     Ordered   11/12/22 1636  Do not attempt resuscitation (DNR) - Comfort care  Continuous       Question Answer Comment  If patient has no pulse and is not breathing Do Not Attempt Resuscitation   In Pre-Arrest Conditions (Patient Is Breathing and Has a Pulse) Provide comfort measures. Relieve any mechanical airway obstruction. Avoid transfer unless required for comfort.   Consent: Discussion documented in EHR or advanced directives reviewed      11/12/22 1638           Code Status History     Date Active Date Inactive Code Status Order ID Comments User Context   11/11/2022 1439 11/12/2022 1638 Limited: Do not attempt resuscitation (DNR) -DNR-LIMITED -Do Not Intubate/DNI  [id number]  Haskel Khan, NP Inpatient   11/10/2022 0049 11/11/2022 1439 Full Code [id number]  Charlsie Quest, MD ED   02/06/2021 1711 02/21/2021 1707 Full Code [id number]  Charlton Amor, PA-C Inpatient   01/30/2021 2212 02/06/2021 1656  Full Code [id number]  Erick Blinks, MD ED       Prognosis:  < 2 weeks  Discharge Planning: Hospice facility  Care plan was discussed with primary RN, hospice  liaison  Thank you for allowing the Palliative Medicine Team to assist in the care of this patient.   Haskel Khan, NP  Please contact Palliative Medicine Team phone at [phone] for questions and concerns.   *Portions of this note are a verbal dictation therefore any spelling and/or grammatical errors are due to the "[employer] One" system interpretation.

## 2022-11-13 NOTE — Discharge Instructions (Signed)
Management per residential hospice.

## 2022-11-13 NOTE — TOC Progression Note (Signed)
Transition of Care [facility]) - Progression Note    Patient Details  Name: Douglas Spencer MRN: [id number] Date of Birth: 05-26-1945  [facility] CM/SW Contact  Mearl Latin, LCSW Phone Number: 11/13/2022, 12:46 PM  Clinical Narrative:    Shawn with [facility] working on referral.    Expected Discharge Plan: Hospice Medical Facility Barriers to Discharge: Continued Medical Work up  Expected Discharge Plan and Services In-house Referral: Clinical Social Work, Hospice / Palliative Care   Post Acute Care Choice: Hospice Living arrangements for the past 2 months: Single Family Home                                       Social Determinants of Health (SDOH) Interventions SDOH Screenings   Food Insecurity: Patient Unable To Answer (11/10/2022)  Housing: Patient Unable To Answer (11/10/2022)  Transportation Needs: Patient Unable To Answer (11/10/2022)  Utilities: Patient Unable To Answer (11/10/2022)  Alcohol Screen: Low Risk  (02/21/2022)  Depression (PHQ2-9): Low Risk  (02/21/2022)  Financial Resource Strain: Low Risk  (02/21/2022)  Physical Activity: Inactive (02/21/2022)  Social Connections: Unknown (02/21/2022)  Stress: No Stress Concern Present (02/21/2022)  Tobacco Use: Medium Risk (02/21/2022)    Readmission Risk Interventions     No data to display

## 2022-11-13 NOTE — Consult Note (Signed)
WOC requested by OT for antimicrobial wicking fabric treatment for ICD (irritant contact dermatitis) of the antecubital fossa. Orders updated ICD-10 CM Codes for Irritant Dermatitis L30.4  - Erythema intertrigo. Also used for abrasion of the hand, chafing of the skin, dermatitis due to sweating and friction, friction dermatitis, friction eczema, and genital/thigh intertrigo.   Order [facility] # [phone] Measure and cut length of InterDry to fit in skin folds that have skin breakdown Tuck InterDry fabric into skin folds in a single layer, allow for 2 inches of overhang from skin edges to allow for wicking to occur May remove to bathe; dry area thoroughly and then tuck into affected areas again Do not apply any creams or ointments when using InterDry DO NOT THROW AWAY FOR 5 DAYS unless soiled with stool DO NOT [facility] product, this will inactivate the silver in the material  New sheet of Interdry should be applied after 5 days of use if patient continues to have skin breakdown      Re consult if needed, will not follow at this time. Thanks  Naythen Heikkila [employer], RN,CWOCN, CNS, CWON-AP [phone])

## 2022-11-14 LAB — CULTURE, BLOOD (ROUTINE X 2)
Culture: NO GROWTH
Special Requests: ADEQUATE

## 2022-11-15 LAB — CULTURE, BLOOD (ROUTINE X 2)
Culture: NO GROWTH
Special Requests: ADEQUATE

## 2022-11-15 LAB — VITAMIN B1: Vitamin B1 (Thiamine): 50.2 nmol/L — ABNORMAL LOW (ref 66.5–200.0)

## 2022-12-04 ENCOUNTER — Ambulatory Visit: Payer: Medicare HMO | Admitting: Internal Medicine

## 2023-01-12 DEATH — deceased

## 2023-04-19 IMAGING — CT CT HEAD W/O CM
4 series · 16 of 47 positions shown, 18 images · non-contrast
Comparison: 01/31/2021, 01/30/2021

CLINICAL DATA: Stroke, follow up post tNKase, evaluate for [place]

EXAM:
CT HEAD WITHOUT CONTRAST
TECHNIQUE: Contiguous axial images were obtained from the base of the skull
through the vertex without intravenous contrast.

[Series 3: head bone · axial · 0.47mm/px · z∈[+1400,+1432]mm · 3 of 80 slices shown]
[im 8/80  bone]
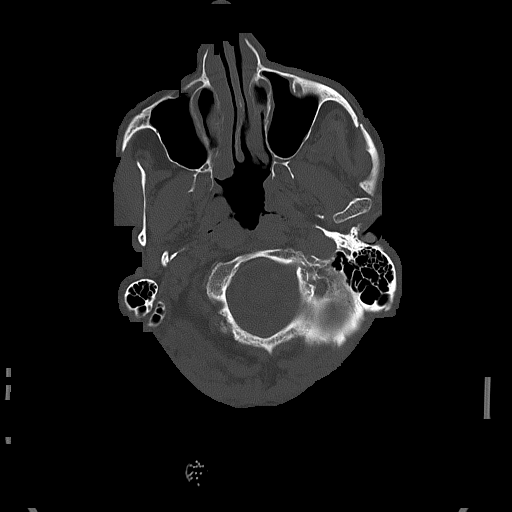
[im 16/80  bone]
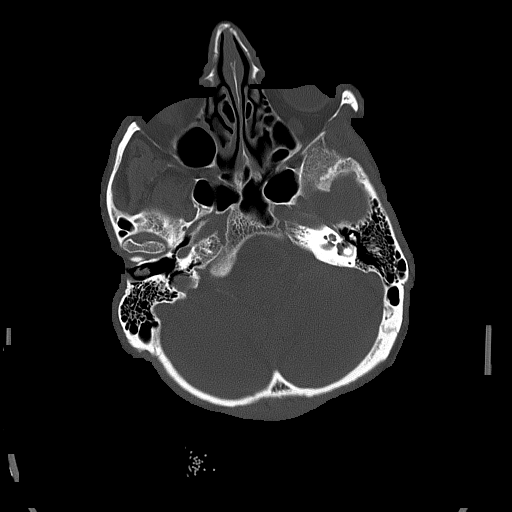
[im 24/80  bone]
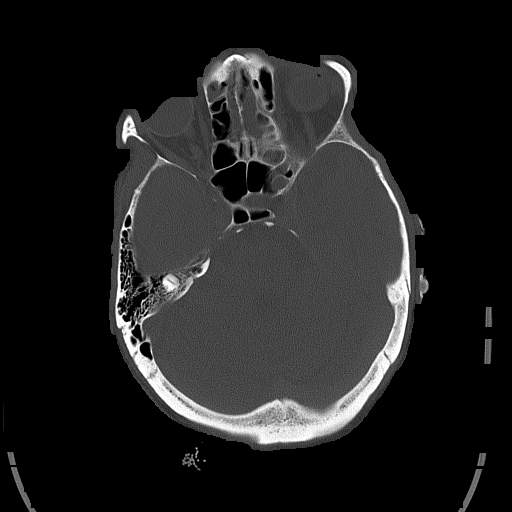

[Series 4: head without · axial · non-contrast · 0.47mm/px · z∈[+1401,+1521]mm · 7 of 32 slices shown, 9 images]
[im 4/32  brain]
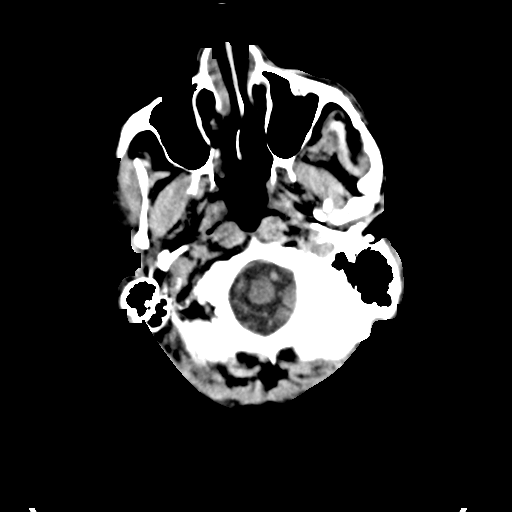
[im 4/32  bone]
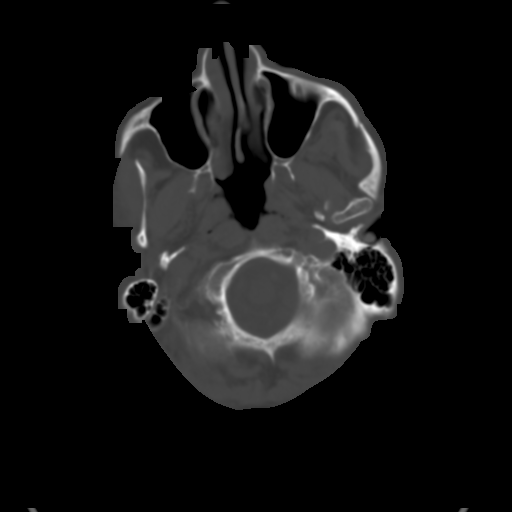
[im 8/32  brain]
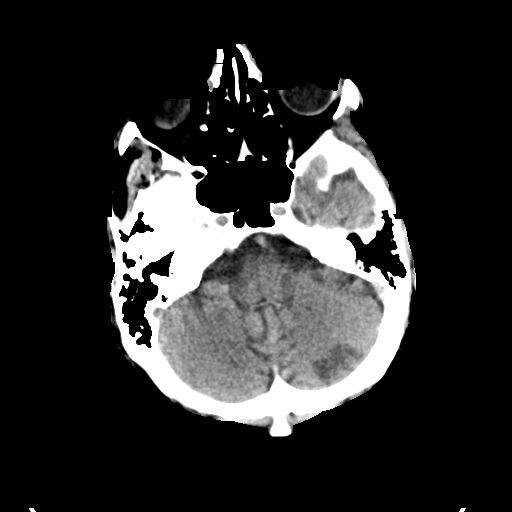
[im 12/32  brain]
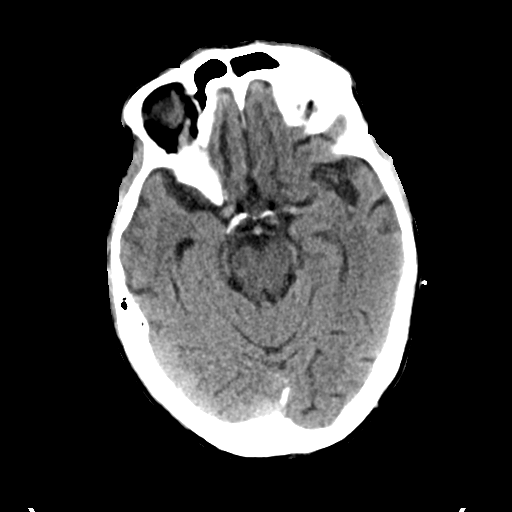
[im 16/32  brain]
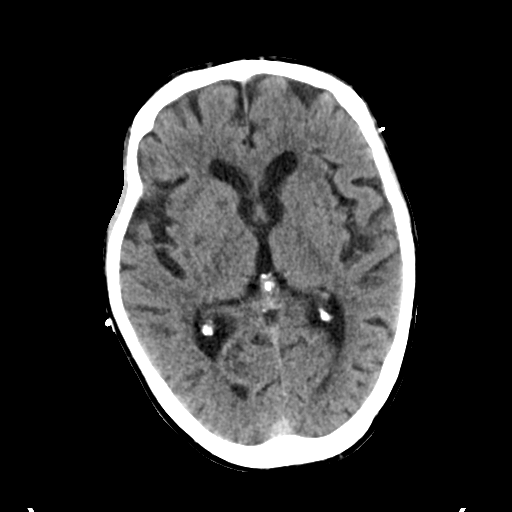
[im 20/32  brain]
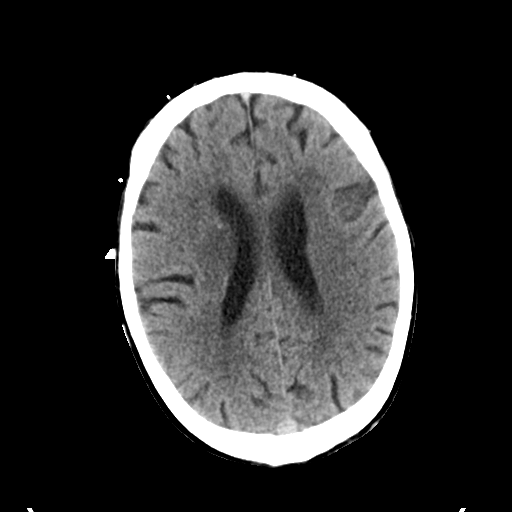
[im 20/32  bone]
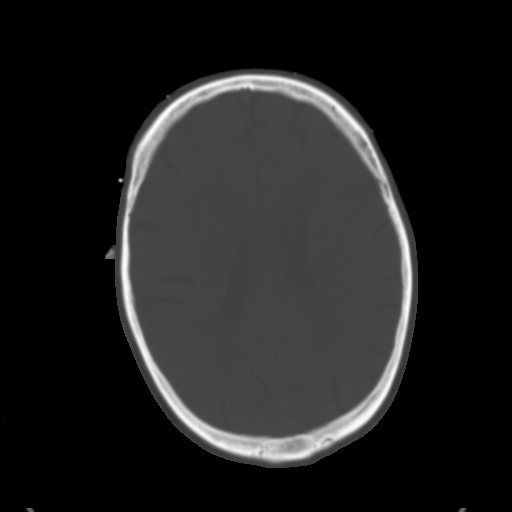
[im 24/32  brain]
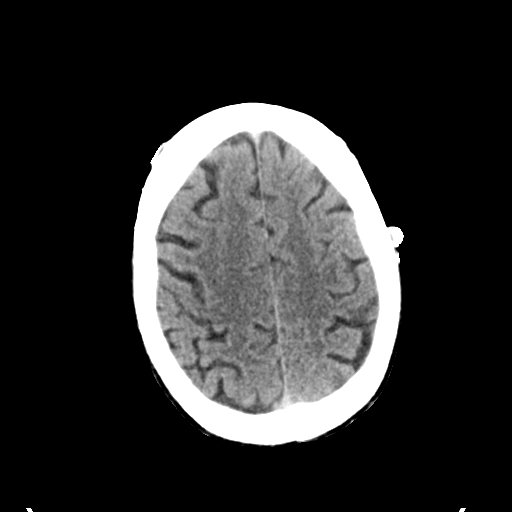
[im 28/32  brain]
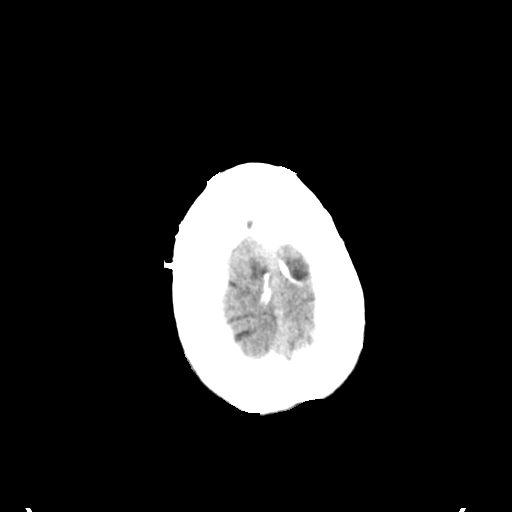

[Series 5: head without cor · coronal · non-contrast · 0.33mm/px · 3 of 68 slices shown]
[im 23/68  brain]
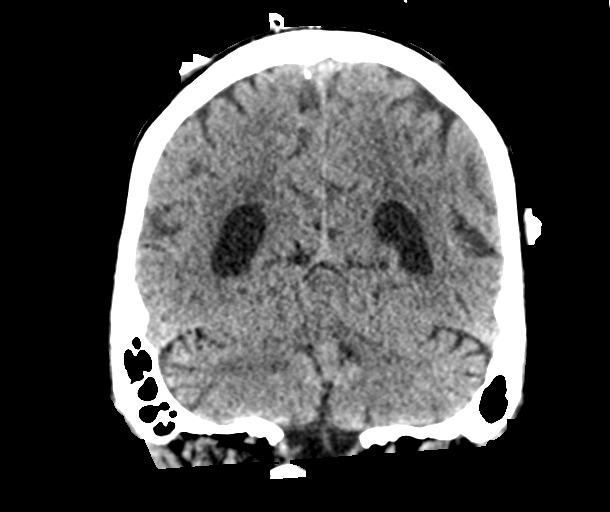
[im 30/68  brain]
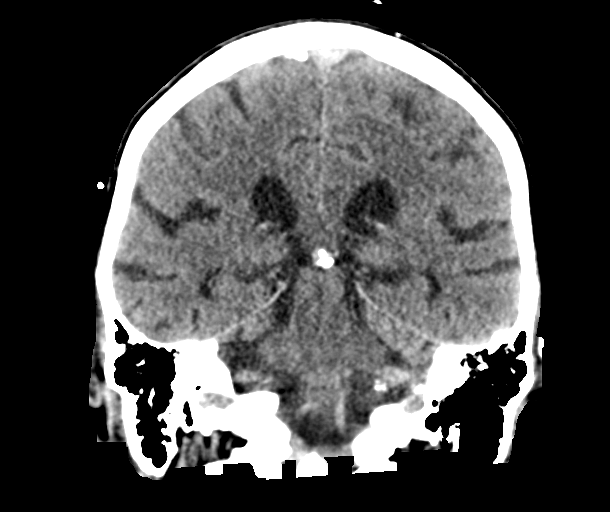
[im 38/68  brain]
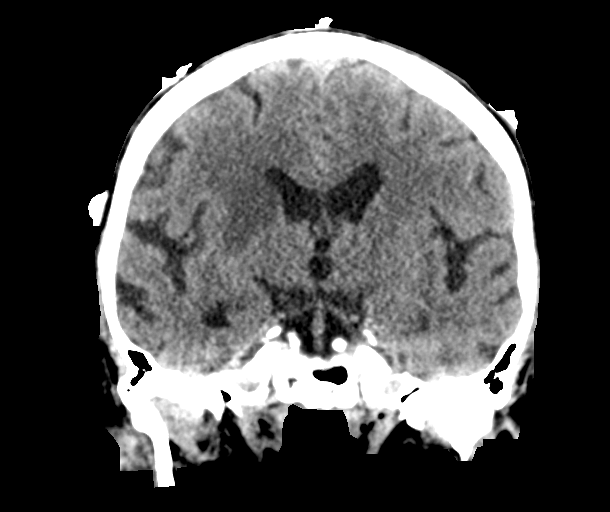

[Series 6: head without sag · sagittal · non-contrast · 0.33mm/px · 3 of 53 slices shown]
[im 18/53  brain]
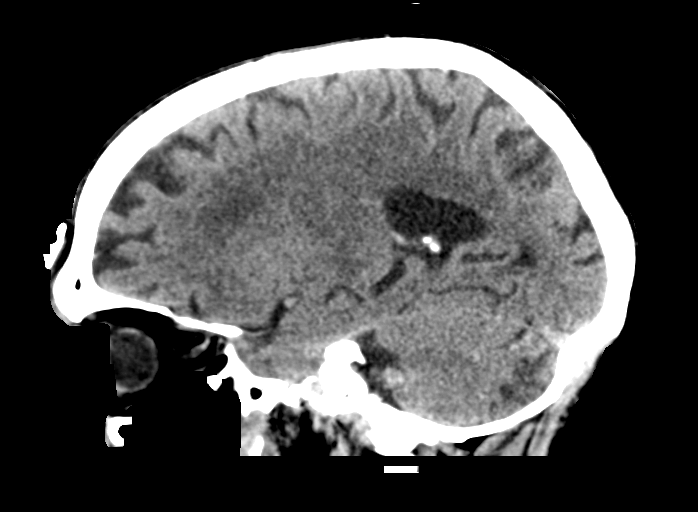
[im 27/53  brain]
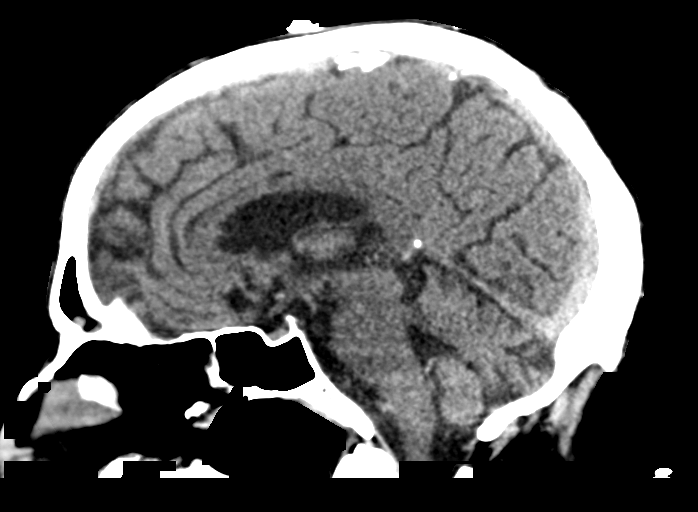
[im 35/53  brain]
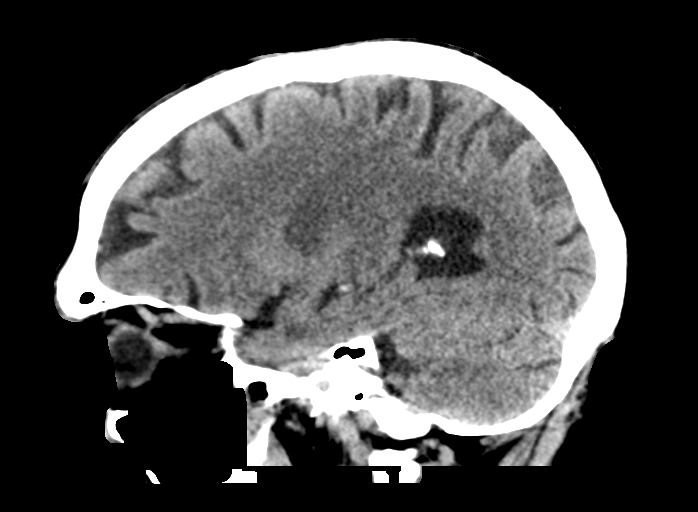

[16 of 47 positions shown; findings below may reference images not displayed]

FINDINGS: Brain: Normal anatomic configuration. Parenchymal volume loss is
commensurate with the patient's age. Mild periventricular white
matter changes are present likely reflecting the sequela of small
vessel ischemia. Subacute infarct involving the right lentiform
nucleus and caudate body is again. Remote left cerebellar infarct
again noted. No abnormal intra or extra-axial mass lesion or fluid
collection. No abnormal mass effect or midline shift. No evidence of
acute intracranial hemorrhage. Ventricular size is normal.
Cerebellum unremarkable.

Vascular: Stable hyperdensity of the a right MCA.

Skull: Intact

Sinuses/Orbits: Mild mucosal thickening within the frontal sinuses
again noted. Remaining paranasal sinuses are clear. Orbits are
unremarkable.

Other: Mastoid air cells and middle ear cavities are clear.
IMPRESSION: Involving infarct within the right basal ganglia again identified.
Stable hyperdensity within the right MCA. No superimposed acute
intracranial hemorrhage.

## 2023-04-19 IMAGING — CT CT HEAD W/O CM
4 of 5 series · 15 of 47 positions shown, 17 images · non-contrast
Comparison: CTA and CT head from yesterday (01/30/2021).

CLINICAL DATA: Mental status change, unknown cause

EXAM:
CT HEAD WITHOUT CONTRAST
TECHNIQUE: Contiguous axial images were obtained from the base of the skull
through the vertex without intravenous contrast.

[Series 3: head without · axial · non-contrast · 0.46mm/px · z∈[-130,-10]mm · 5 of 36 slices shown, 7 images]
[im 6/36  brain]
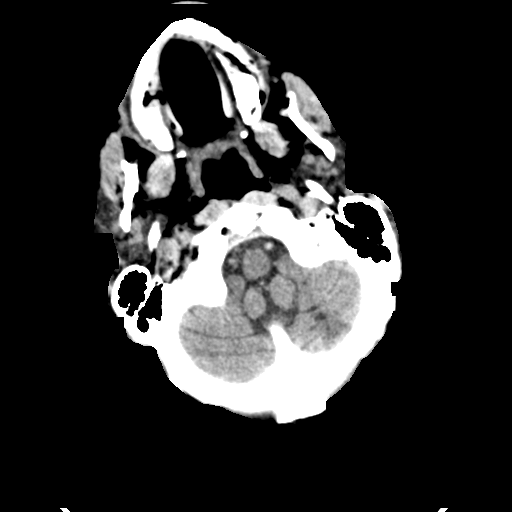
[im 6/36  bone]
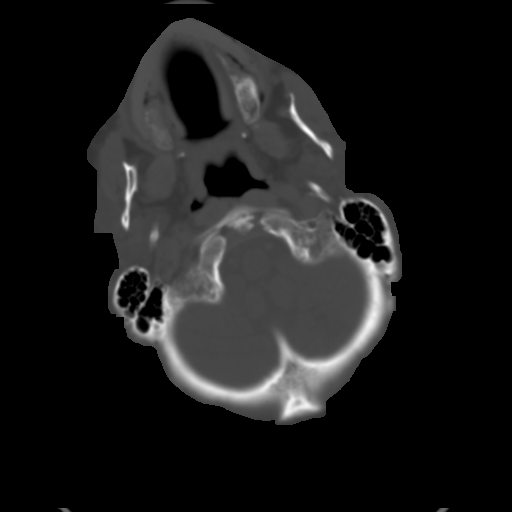
[im 12/36  brain]
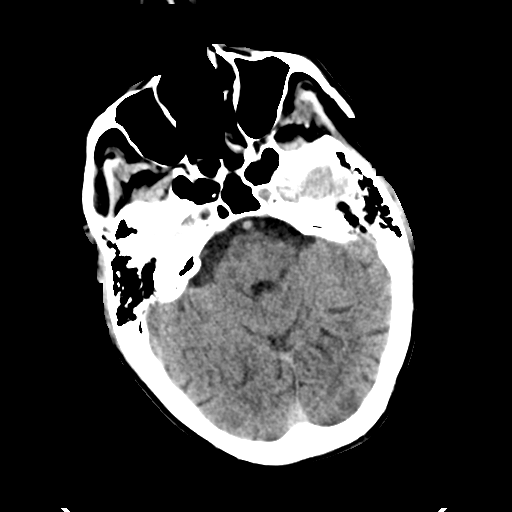
[im 18/36  brain]
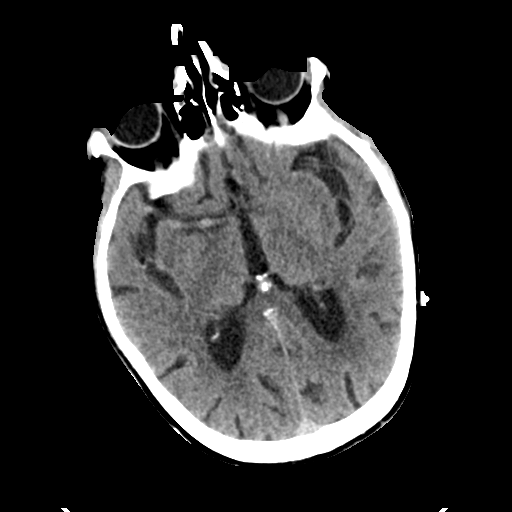
[im 24/36  brain]
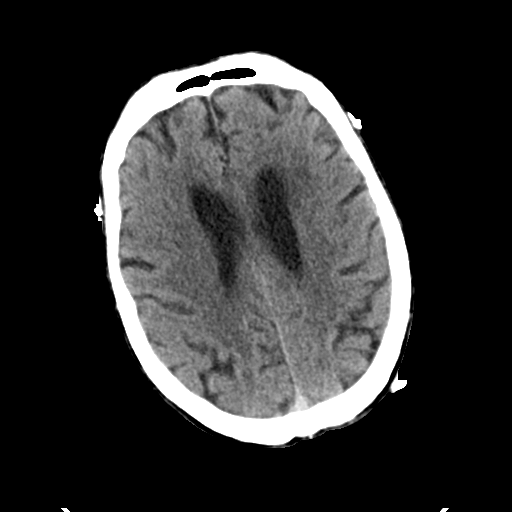
[im 30/36  brain]
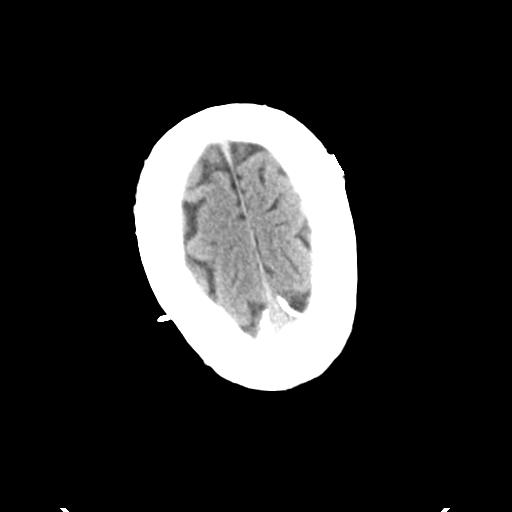
[im 30/36  bone]
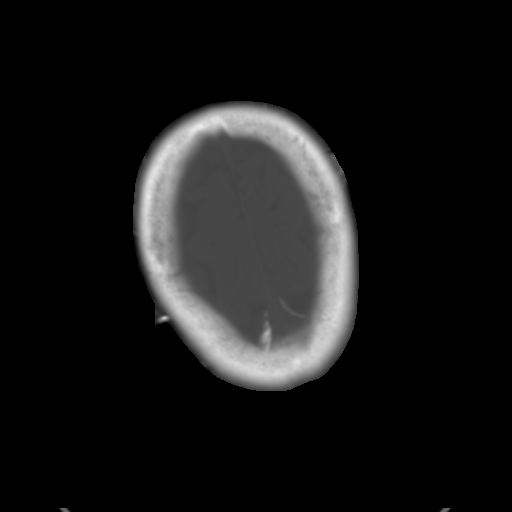

[Series 5: head without ax · axial · non-contrast · 0.36mm/px · z∈[-94,-9]mm · 4 of 38 slices shown]
[im 7/38  brain]
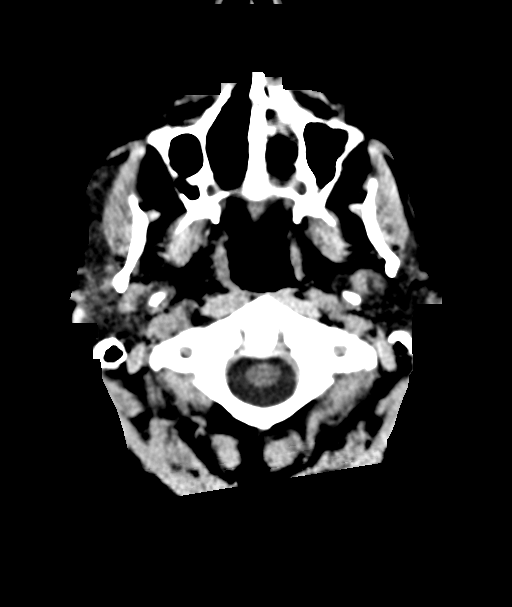
[im 13/38  brain]
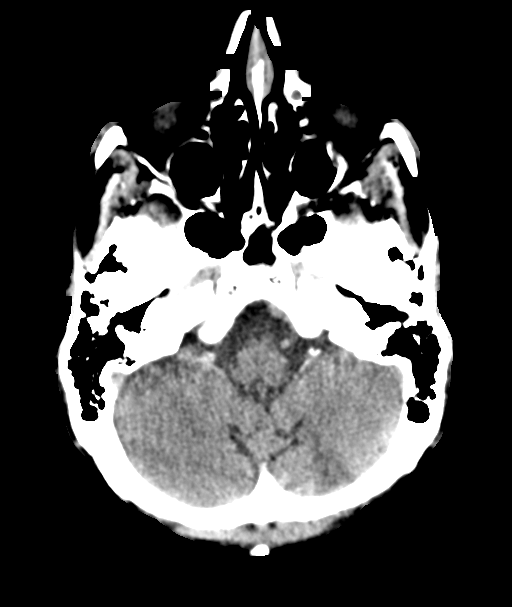
[im 19/38  brain]
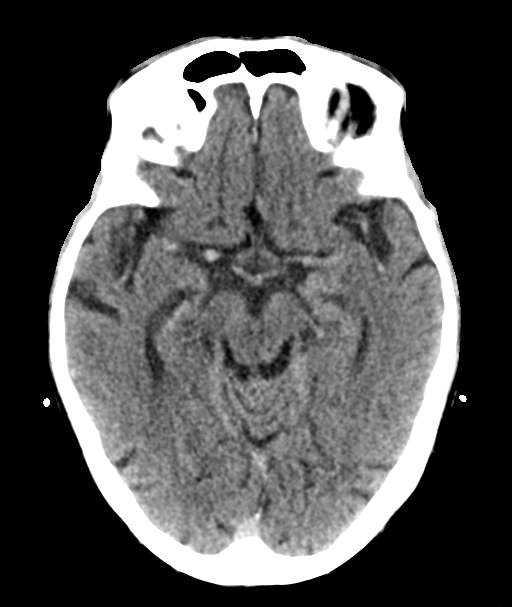
[im 25/38  brain]
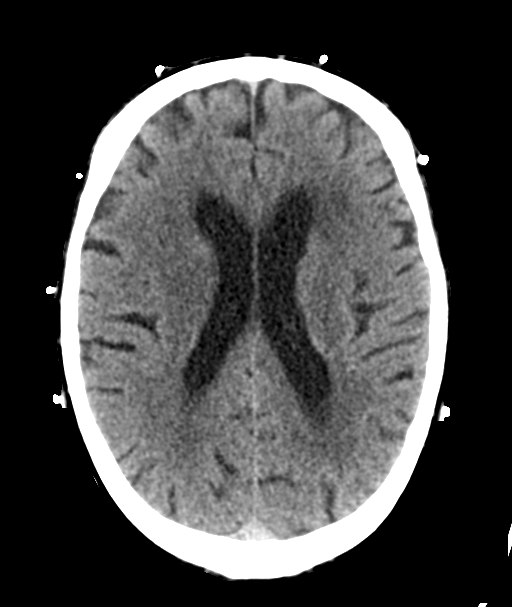

[Series 6: head without cor · coronal · non-contrast · 0.35mm/px · 3 of 72 slices shown]
[im 24/72  brain]
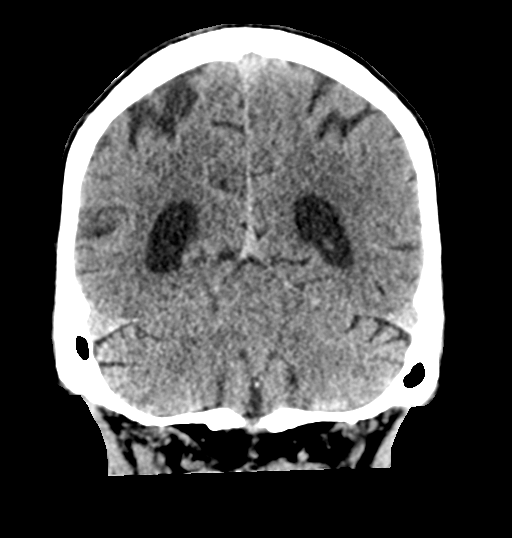
[im 32/72  brain]
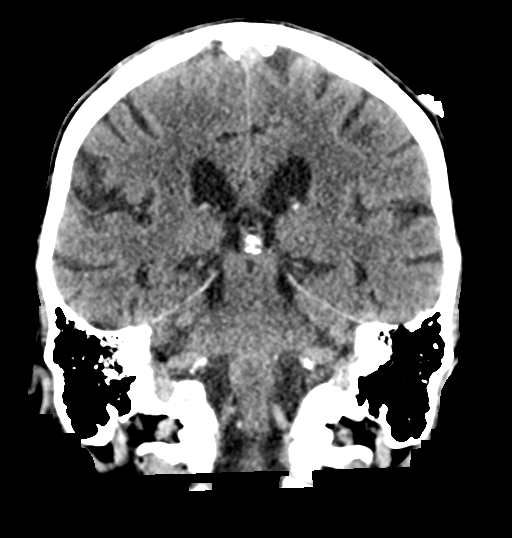
[im 40/72  brain]
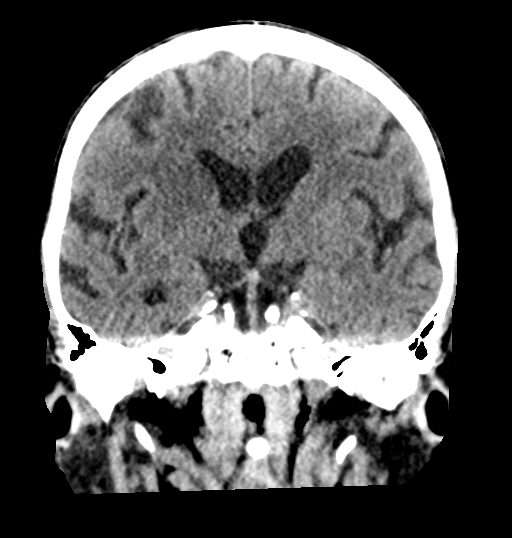

[Series 7: head without sag · sagittal · non-contrast · 0.37mm/px · 3 of 59 slices shown]
[im 20/59  brain]
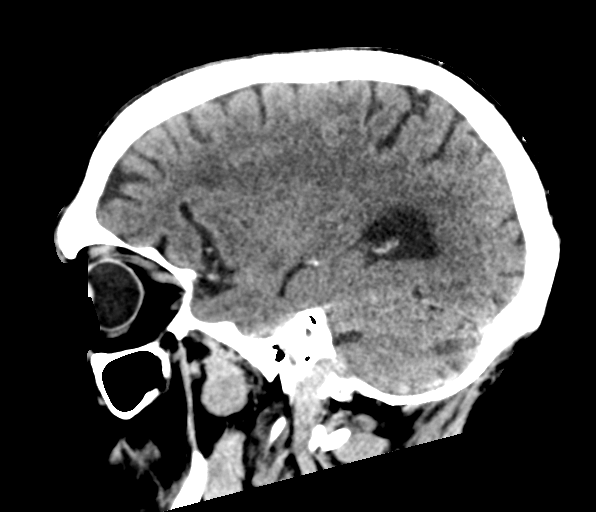
[im 30/59  brain]
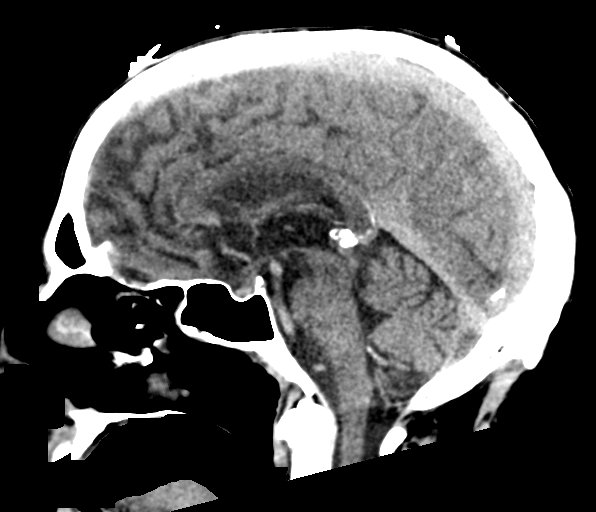
[im 39/59  brain]
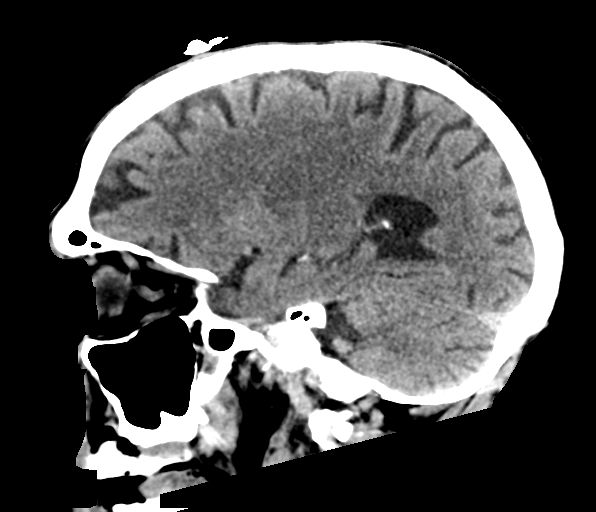

[15 of 47 positions shown; findings below may reference images not displayed]

FINDINGS: Brain: Interval development of a hypodensity within the posterior
right putamen and adjacent white matter. No significant mass effect.
No midline shift. Age indeterminate infarct in the inferior left
cerebellum. No acute hemorrhage. No visible mass lesion or
extra-axial fluid collection.

Similar remote infarct in the left caudate. Additional patchy white
matter hypoattenuation, nonspecific but compatible with chronic
microvascular disease.

Vascular: Hyperdense appearance of some of the vessels is favored to
reflect residual contrast from prior CTA and/or artifact.

Skull: No acute fracture.

Sinuses/Orbits: Mild paranasal sinus mucosal thickening.
Unremarkable orbits.

Other: No mastoid effusions. Cerumen in bilateral external auditory
canals.
IMPRESSION: 1. Interval development of a hypodensity in the right putamen and
adjacent white matter, concerning for acute perforator infarct. Age
indeterminate infarct in the inferior left cerebellum. If the
patient is able, recommend MRI to further evaluate.
2. No acute hemorrhage or significant mass effect.
3. Remote left caudate infarct and chronic microvascular disease.

These results will be called to the ordering clinician or
representative by the Radiologist Assistant, and communication
documented in the PACS or [REDACTED].
